# Patient Record
Sex: Male | Born: 1940
Health system: Southern US, Community
[De-identification: ages and names within clinical notes are randomized; demographics above are authoritative.]

## PROBLEM LIST (undated history)

## (undated) DIAGNOSIS — L0390 Cellulitis, unspecified: Secondary | ICD-10-CM

## (undated) DIAGNOSIS — M1990 Unspecified osteoarthritis, unspecified site: Secondary | ICD-10-CM

## (undated) DIAGNOSIS — E875 Hyperkalemia: Secondary | ICD-10-CM

## (undated) DIAGNOSIS — E785 Hyperlipidemia, unspecified: Secondary | ICD-10-CM

## (undated) DIAGNOSIS — E119 Type 2 diabetes mellitus without complications: Secondary | ICD-10-CM

## (undated) DIAGNOSIS — I10 Essential (primary) hypertension: Secondary | ICD-10-CM

## (undated) DIAGNOSIS — N189 Chronic kidney disease, unspecified: Secondary | ICD-10-CM

## (undated) DIAGNOSIS — E860 Dehydration: Secondary | ICD-10-CM

## (undated) DIAGNOSIS — D649 Anemia, unspecified: Secondary | ICD-10-CM

## (undated) HISTORY — PX: CERVICAL DISC SURGERY: SHX588

## (undated) HISTORY — PX: BACK SURGERY: SHX140

---

## 1999-07-01 ENCOUNTER — Ambulatory Visit (HOSPITAL_COMMUNITY): Admission: RE | Admit: 1999-07-01 | Discharge: 1999-07-01 | Payer: Self-pay | Admitting: Gastroenterology

## 2002-10-22 ENCOUNTER — Ambulatory Visit (HOSPITAL_COMMUNITY): Admission: RE | Admit: 2002-10-22 | Discharge: 2002-10-22 | Payer: Self-pay | Admitting: Gastroenterology

## 2003-02-06 ENCOUNTER — Encounter: Payer: Self-pay | Admitting: Internal Medicine

## 2003-02-06 ENCOUNTER — Encounter: Admission: RE | Admit: 2003-02-06 | Discharge: 2003-02-06 | Payer: Self-pay | Admitting: Internal Medicine

## 2003-03-26 ENCOUNTER — Inpatient Hospital Stay (HOSPITAL_COMMUNITY): Admission: AD | Admit: 2003-03-26 | Discharge: 2003-03-29 | Payer: Self-pay | Admitting: Neurosurgery

## 2003-05-09 ENCOUNTER — Encounter: Admission: RE | Admit: 2003-05-09 | Discharge: 2003-05-09 | Payer: Self-pay | Admitting: Neurosurgery

## 2003-07-09 ENCOUNTER — Encounter: Admission: RE | Admit: 2003-07-09 | Discharge: 2003-07-09 | Payer: Self-pay | Admitting: Neurosurgery

## 2012-06-30 ENCOUNTER — Ambulatory Visit (HOSPITAL_COMMUNITY)
Admission: RE | Admit: 2012-06-30 | Discharge: 2012-06-30 | Disposition: A | Discharge status: Home / Self Care | Payer: BC Managed Care – PPO | Source: Ambulatory Visit | Attending: Internal Medicine | Admitting: Internal Medicine

## 2012-06-30 ENCOUNTER — Other Ambulatory Visit: Payer: Self-pay | Admitting: Internal Medicine

## 2012-06-30 ENCOUNTER — Other Ambulatory Visit (HOSPITAL_COMMUNITY): Payer: Self-pay | Admitting: Internal Medicine

## 2012-06-30 DIAGNOSIS — M7989 Other specified soft tissue disorders: Secondary | ICD-10-CM

## 2012-06-30 NOTE — Progress Notes (Signed)
*  Preliminary Results* Left lower extremity venous duplex completed. Left lower extremity is negative for deep vein thrombosis. No evidence of left Baker's cyst. There is a thrombosed varicose vein of the medial mid left calf. Preliminary results discussed with Bethena Roys, RN of Dr.Polite's office. Patient has been instructed by Dr.Polite to return home.  06/30/2012 1:45 PM Maudry Mayhew, RDMS, RDCS

## 2012-09-22 ENCOUNTER — Ambulatory Visit
Admission: RE | Admit: 2012-09-22 | Discharge: 2012-09-22 | Disposition: A | Discharge status: Home / Self Care | Payer: BC Managed Care – PPO | Source: Ambulatory Visit | Attending: Internal Medicine | Admitting: Internal Medicine

## 2012-09-22 ENCOUNTER — Other Ambulatory Visit: Payer: Self-pay | Admitting: Internal Medicine

## 2012-09-22 DIAGNOSIS — G959 Disease of spinal cord, unspecified: Secondary | ICD-10-CM

## 2012-10-07 DIAGNOSIS — M25569 Pain in unspecified knee: Secondary | ICD-10-CM | POA: Insufficient documentation

## 2012-10-12 ENCOUNTER — Other Ambulatory Visit: Payer: Self-pay | Admitting: Neurosurgery

## 2012-10-13 ENCOUNTER — Encounter (HOSPITAL_COMMUNITY): Payer: Self-pay | Admitting: Pharmacy Technician

## 2012-10-16 ENCOUNTER — Encounter (HOSPITAL_COMMUNITY)
Admission: RE | Admit: 2012-10-16 | Discharge: 2012-10-16 | Disposition: A | Discharge status: Home / Self Care | Payer: BC Managed Care – PPO | Source: Ambulatory Visit | Attending: Neurosurgery | Admitting: Neurosurgery

## 2012-10-16 ENCOUNTER — Encounter (HOSPITAL_COMMUNITY): Payer: Self-pay

## 2012-10-16 HISTORY — DX: Hyperlipidemia, unspecified: E78.5

## 2012-10-16 HISTORY — DX: Type 2 diabetes mellitus without complications: E11.9

## 2012-10-16 LAB — SURGICAL PCR SCREEN
MRSA, PCR: NEGATIVE
Staphylococcus aureus: POSITIVE — AB

## 2012-10-16 LAB — BASIC METABOLIC PANEL
Chloride: 107 mEq/L (ref 96–112)
GFR calc Af Amer: 83 mL/min — ABNORMAL LOW (ref >90)
GFR calc non Af Amer: 72 mL/min — ABNORMAL LOW (ref >90)
Potassium: 4.4 mEq/L (ref 3.5–5.1)

## 2012-10-16 LAB — CBC WITH DIFFERENTIAL/PLATELET
Basophils Absolute: 0.1 10*3/uL (ref 0.0–0.1)
HCT: 35.3 % — ABNORMAL LOW (ref 39.0–52.0)
Hemoglobin: 11.4 g/dL — ABNORMAL LOW (ref 13.0–17.0)
Lymphocytes Relative: 9 % — ABNORMAL LOW (ref 12–46)
Monocytes Absolute: 0.8 10*3/uL (ref 0.1–1.0)
Monocytes Relative: 10 % (ref 3–12)
Neutro Abs: 6.4 10*3/uL (ref 1.7–7.7)
Neutrophils Relative %: 77 % (ref 43–77)
RDW: 14 % (ref 11.5–15.5)
WBC: 8.4 10*3/uL (ref 4.0–10.5)

## 2012-10-16 MED ORDER — CEFAZOLIN SODIUM-DEXTROSE 2-3 GM-% IV SOLR
2.0000 g | INTRAVENOUS | Status: AC
  Administered 2012-10-17: 2 g via INTRAVENOUS
  Filled 2012-10-16: qty 50

## 2012-10-16 NOTE — Pre-Procedure Instructions (Signed)
Jason Mcpherson  10/16/2012   Your procedure is scheduled on: Tuesday, October 17, 2012  Report to [facility] at 10:45 AM.  Call this number if you have problems the morning of surgery: [phone]   Remember:   Do not eat food or drink liquids after midnight.   Take these medicines the morning of surgery with A SIP OF WATER: none             Stop taking Aspirin and herbal medications. Do not take any NSAIDs ie: Ibuprofen, Advil, Naproxen or any medication containing Aspirin.  Do not wear jewelry, make-up or nail polish.  Do not wear lotions, powders, or perfumes. You may wear deodorant.  Do not shave 48 hours prior to surgery. Men may shave face and neck.  Do not bring valuables to the hospital.  [facility] is not responsible  for any belongings or valuables.  Contacts, dentures or bridgework may not be worn into surgery.  Leave suitcase in the car. After surgery it may be brought to your room.  For patients admitted to the hospital, checkout time is 11:00 AM the day of discharge.   Patients discharged the day of surgery will not be allowed to drive home.  Name and phone number of your driver:   Special Instructions: Shower using CHG 2 nights before surgery and the night before surgery.  If you shower the day of surgery use CHG.  Use special wash - you have one bottle of CHG for all showers.  You should use approximately 1/3 of the bottle for each shower.   Please read over the following fact sheets that you were given: Pain Booklet, Coughing and Deep Breathing, MRSA Information and Surgical Site Infection Prevention

## 2012-10-17 ENCOUNTER — Ambulatory Visit (HOSPITAL_COMMUNITY): Payer: BC Managed Care – PPO

## 2012-10-17 ENCOUNTER — Inpatient Hospital Stay (HOSPITAL_COMMUNITY)
Admission: RE | Admit: 2012-10-17 | Discharge: 2012-10-18 | DRG: 865 | Disposition: A | Discharge status: Home / Self Care | Payer: BC Managed Care – PPO | Source: Ambulatory Visit | Attending: Neurosurgery | Admitting: Neurosurgery

## 2012-10-17 ENCOUNTER — Encounter (HOSPITAL_COMMUNITY)
Admission: RE | Disposition: A | Discharge status: Home / Self Care | Payer: Self-pay | Source: Ambulatory Visit | Attending: Neurosurgery

## 2012-10-17 ENCOUNTER — Encounter (HOSPITAL_COMMUNITY): Payer: Self-pay | Admitting: Anesthesiology

## 2012-10-17 ENCOUNTER — Encounter (HOSPITAL_COMMUNITY): Payer: Self-pay | Admitting: *Deleted

## 2012-10-17 ENCOUNTER — Ambulatory Visit (HOSPITAL_COMMUNITY): Payer: BC Managed Care – PPO | Admitting: Anesthesiology

## 2012-10-17 DIAGNOSIS — Z01812 Encounter for preprocedural laboratory examination: Secondary | ICD-10-CM

## 2012-10-17 DIAGNOSIS — M4802 Spinal stenosis, cervical region: Secondary | ICD-10-CM | POA: Diagnosis present

## 2012-10-17 DIAGNOSIS — Z87891 Personal history of nicotine dependence: Secondary | ICD-10-CM

## 2012-10-17 DIAGNOSIS — Z01810 Encounter for preprocedural cardiovascular examination: Secondary | ICD-10-CM

## 2012-10-17 DIAGNOSIS — E119 Type 2 diabetes mellitus without complications: Secondary | ICD-10-CM | POA: Diagnosis present

## 2012-10-17 DIAGNOSIS — Z794 Long term (current) use of insulin: Secondary | ICD-10-CM

## 2012-10-17 DIAGNOSIS — E785 Hyperlipidemia, unspecified: Secondary | ICD-10-CM | POA: Diagnosis present

## 2012-10-17 DIAGNOSIS — Z79899 Other long term (current) drug therapy: Secondary | ICD-10-CM

## 2012-10-17 DIAGNOSIS — Z7982 Long term (current) use of aspirin: Secondary | ICD-10-CM

## 2012-10-17 DIAGNOSIS — I10 Essential (primary) hypertension: Secondary | ICD-10-CM | POA: Diagnosis present

## 2012-10-17 DIAGNOSIS — M4712 Other spondylosis with myelopathy, cervical region: Principal | ICD-10-CM | POA: Diagnosis present

## 2012-10-17 HISTORY — PX: ANTERIOR CERVICAL DECOMP/DISCECTOMY FUSION: SHX1161

## 2012-10-17 LAB — GLUCOSE, CAPILLARY: Glucose-Capillary: 265 mg/dL — ABNORMAL HIGH (ref 70–99)

## 2012-10-17 SURGERY — ANTERIOR CERVICAL DECOMPRESSION/DISCECTOMY FUSION 2 LEVELS
Anesthesia: General | Site: Neck | Wound class: Clean

## 2012-10-17 MED ORDER — BACITRACIN 50000 UNITS IM SOLR
INTRAMUSCULAR | Status: DC | PRN
  Administered 2012-10-17: 15:00:00

## 2012-10-17 MED ORDER — MIDAZOLAM HCL 2 MG/2ML IJ SOLN: 0.5000 mg | Freq: Once | INTRAMUSCULAR | Status: DC | PRN

## 2012-10-17 MED ORDER — HYDROMORPHONE HCL PF 1 MG/ML IJ SOLN
INTRAMUSCULAR | Status: DC | PRN
  Administered 2012-10-17: 1 mg via INTRAVENOUS

## 2012-10-17 MED ORDER — SODIUM CHLORIDE 0.9 % IV SOLN: 250.0000 mL | INTRAVENOUS | Status: DC

## 2012-10-17 MED ORDER — OXYCODONE-ACETAMINOPHEN 5-325 MG PO TABS: 1.0000 | ORAL_TABLET | ORAL | Status: DC | PRN

## 2012-10-17 MED ORDER — OXYCODONE HCL 5 MG PO TABS: 5.0000 mg | ORAL_TABLET | Freq: Once | ORAL | Status: DC | PRN

## 2012-10-17 MED ORDER — PHENOL 1.4 % MT LIQD
1.0000 | OROMUCOSAL | Status: DC | PRN
  Administered 2012-10-17: 1 via OROMUCOSAL
  Filled 2012-10-17: qty 177

## 2012-10-17 MED ORDER — ONDANSETRON HCL 4 MG/2ML IJ SOLN: 4.0000 mg | INTRAMUSCULAR | Status: DC | PRN

## 2012-10-17 MED ORDER — NEOSTIGMINE METHYLSULFATE 1 MG/ML IJ SOLN
INTRAMUSCULAR | Status: DC | PRN
  Administered 2012-10-17: 3 mg via INTRAVENOUS

## 2012-10-17 MED ORDER — GLYCOPYRROLATE 0.2 MG/ML IJ SOLN
INTRAMUSCULAR | Status: DC | PRN
  Administered 2012-10-17: 0.4 mg via INTRAVENOUS
  Administered 2012-10-17: 0.2 mg via INTRAVENOUS

## 2012-10-17 MED ORDER — ROCURONIUM BROMIDE 100 MG/10ML IV SOLN
INTRAVENOUS | Status: DC | PRN
  Administered 2012-10-17: 25 mg via INTRAVENOUS
  Administered 2012-10-17: 40 mg via INTRAVENOUS

## 2012-10-17 MED ORDER — 0.9 % SODIUM CHLORIDE (POUR BTL) OPTIME
TOPICAL | Status: DC | PRN
  Administered 2012-10-17: 1000 mL

## 2012-10-17 MED ORDER — FENTANYL CITRATE 0.05 MG/ML IJ SOLN
INTRAMUSCULAR | Status: DC | PRN
  Administered 2012-10-17 (×5): 50 ug via INTRAVENOUS

## 2012-10-17 MED ORDER — PHENYLEPHRINE HCL 10 MG/ML IJ SOLN
INTRAMUSCULAR | Status: DC | PRN
  Administered 2012-10-17 (×2): 80 ug via INTRAVENOUS

## 2012-10-17 MED ORDER — THROMBIN 5000 UNITS EX SOLR
CUTANEOUS | Status: DC | PRN
  Administered 2012-10-17: 5000 [IU] via TOPICAL

## 2012-10-17 MED ORDER — INSULIN DETEMIR 100 UNIT/ML ~~LOC~~ SOLN
16.0000 [IU] | Freq: Two times a day (BID) | SUBCUTANEOUS | Status: DC
  Administered 2012-10-17: 16 [IU] via SUBCUTANEOUS
  Filled 2012-10-17 (×3): qty 0.16

## 2012-10-17 MED ORDER — HYDROMORPHONE HCL PF 1 MG/ML IJ SOLN: 0.2500 mg | INTRAMUSCULAR | Status: DC | PRN

## 2012-10-17 MED ORDER — THROMBIN 5000 UNITS EX SOLR
OROMUCOSAL | Status: DC | PRN
  Administered 2012-10-17: 16:00:00 via TOPICAL

## 2012-10-17 MED ORDER — PROPOFOL 10 MG/ML IV BOLUS
INTRAVENOUS | Status: DC | PRN
  Administered 2012-10-17: 100 mg via INTRAVENOUS

## 2012-10-17 MED ORDER — OXYCODONE HCL 5 MG/5ML PO SOLN: 5.0000 mg | Freq: Once | ORAL | Status: DC | PRN

## 2012-10-17 MED ORDER — CYCLOBENZAPRINE HCL 10 MG PO TABS: 10.0000 mg | ORAL_TABLET | Freq: Three times a day (TID) | ORAL | Status: DC | PRN

## 2012-10-17 MED ORDER — SIMVASTATIN 20 MG PO TABS
20.0000 mg | ORAL_TABLET | Freq: Every evening | ORAL | Status: DC
  Administered 2012-10-17: 20 mg via ORAL
  Filled 2012-10-17 (×2): qty 1

## 2012-10-17 MED ORDER — HYDROCODONE-ACETAMINOPHEN 5-325 MG PO TABS: 1.0000 | ORAL_TABLET | ORAL | Status: DC | PRN

## 2012-10-17 MED ORDER — LOSARTAN POTASSIUM 50 MG PO TABS
100.0000 mg | ORAL_TABLET | Freq: Every day | ORAL | Status: DC
  Administered 2012-10-17 – 2012-10-18 (×2): 100 mg via ORAL
  Filled 2012-10-17 (×2): qty 2

## 2012-10-17 MED ORDER — ALUM & MAG HYDROXIDE-SIMETH 200-200-20 MG/5ML PO SUSP: 30.0000 mL | Freq: Four times a day (QID) | ORAL | Status: DC | PRN

## 2012-10-17 MED ORDER — CEFAZOLIN SODIUM 1-5 GM-% IV SOLN
1.0000 g | Freq: Three times a day (TID) | INTRAVENOUS | Status: AC
  Administered 2012-10-17 – 2012-10-18 (×2): 1 g via INTRAVENOUS
  Filled 2012-10-17 (×2): qty 50

## 2012-10-17 MED ORDER — HEMOSTATIC AGENTS (NO CHARGE) OPTIME
TOPICAL | Status: DC | PRN
  Administered 2012-10-17: 1 via TOPICAL

## 2012-10-17 MED ORDER — LACTATED RINGERS IV SOLN
INTRAVENOUS | Status: DC | PRN
  Administered 2012-10-17 (×2): via INTRAVENOUS

## 2012-10-17 MED ORDER — METFORMIN HCL 500 MG PO TABS
1000.0000 mg | ORAL_TABLET | Freq: Two times a day (BID) | ORAL | Status: DC
Start: 2012-10-18 — End: 2012-10-18
  Administered 2012-10-18: 1000 mg via ORAL
  Filled 2012-10-17 (×3): qty 2

## 2012-10-17 MED ORDER — MENTHOL 3 MG MT LOZG: 1.0000 | LOZENGE | OROMUCOSAL | Status: DC | PRN

## 2012-10-17 MED ORDER — SODIUM CHLORIDE 0.9 % IV SOLN
INTRAVENOUS | Status: AC
  Administered 2012-10-17: 1000 mL
  Filled 2012-10-17: qty 500

## 2012-10-17 MED ORDER — SODIUM CHLORIDE 0.9 % IJ SOLN: 3.0000 mL | INTRAMUSCULAR | Status: DC | PRN

## 2012-10-17 MED ORDER — SENNA 8.6 MG PO TABS
1.0000 | ORAL_TABLET | Freq: Two times a day (BID) | ORAL | Status: DC
  Administered 2012-10-17 – 2012-10-18 (×2): 8.6 mg via ORAL
  Filled 2012-10-17 (×3): qty 1

## 2012-10-17 MED ORDER — MEPERIDINE HCL 25 MG/ML IJ SOLN: 6.2500 mg | INTRAMUSCULAR | Status: DC | PRN

## 2012-10-17 MED ORDER — ACETAMINOPHEN 325 MG PO TABS: 650.0000 mg | ORAL_TABLET | ORAL | Status: DC | PRN

## 2012-10-17 MED ORDER — ACETAMINOPHEN 650 MG RE SUPP: 650.0000 mg | RECTAL | Status: DC | PRN

## 2012-10-17 MED ORDER — FERROUS SULFATE 325 (65 FE) MG PO TABS
325.0000 mg | ORAL_TABLET | Freq: Every day | ORAL | Status: DC
  Administered 2012-10-18: 325 mg via ORAL
  Filled 2012-10-17 (×2): qty 1

## 2012-10-17 MED ORDER — EPHEDRINE SULFATE 50 MG/ML IJ SOLN
INTRAMUSCULAR | Status: DC | PRN
  Administered 2012-10-17: 10 mg via INTRAVENOUS

## 2012-10-17 MED ORDER — DEXAMETHASONE SODIUM PHOSPHATE 10 MG/ML IJ SOLN
10.0000 mg | INTRAMUSCULAR | Status: AC
  Administered 2012-10-17: 10 mg via INTRAVENOUS
  Filled 2012-10-17: qty 1

## 2012-10-17 MED ORDER — MIDAZOLAM HCL 5 MG/5ML IJ SOLN
INTRAMUSCULAR | Status: DC | PRN
  Administered 2012-10-17 (×2): 1 mg via INTRAVENOUS

## 2012-10-17 MED ORDER — HYDROMORPHONE HCL PF 1 MG/ML IJ SOLN
0.5000 mg | INTRAMUSCULAR | Status: DC | PRN
  Administered 2012-10-17: 1 mg via INTRAVENOUS
  Filled 2012-10-17: qty 1

## 2012-10-17 MED ORDER — SODIUM CHLORIDE 0.9 % IJ SOLN
3.0000 mL | Freq: Two times a day (BID) | INTRAMUSCULAR | Status: DC
  Administered 2012-10-17 – 2012-10-18 (×2): 3 mL via INTRAVENOUS

## 2012-10-17 MED ORDER — MUPIROCIN 2 % EX OINT
1.0000 "application " | TOPICAL_OINTMENT | Freq: Two times a day (BID) | CUTANEOUS | Status: DC
  Administered 2012-10-17 – 2012-10-18 (×2): 1 via NASAL
  Filled 2012-10-17: qty 22

## 2012-10-17 MED ORDER — ONDANSETRON HCL 4 MG/2ML IJ SOLN
INTRAMUSCULAR | Status: DC | PRN
  Administered 2012-10-17: 4 mg via INTRAVENOUS

## 2012-10-17 MED ORDER — LIDOCAINE HCL (CARDIAC) 20 MG/ML IV SOLN
INTRAVENOUS | Status: DC | PRN
  Administered 2012-10-17: 60 mg via INTRAVENOUS

## 2012-10-17 MED ORDER — ASPIRIN 81 MG PO CHEW
81.0000 mg | CHEWABLE_TABLET | Freq: Every day | ORAL | Status: DC
  Administered 2012-10-17 – 2012-10-18 (×2): 81 mg via ORAL
  Filled 2012-10-17 (×2): qty 1

## 2012-10-17 MED ORDER — GLIPIZIDE ER 5 MG PO TB24
5.0000 mg | ORAL_TABLET | Freq: Every day | ORAL | Status: DC
  Administered 2012-10-17 – 2012-10-18 (×2): 5 mg via ORAL
  Filled 2012-10-17 (×2): qty 1

## 2012-10-17 MED ORDER — BACITRACIN 50000 UNITS IM SOLR
INTRAMUSCULAR | Status: AC
  Filled 2012-10-17: qty 1

## 2012-10-17 MED ORDER — INSULIN ASPART 100 UNIT/ML ~~LOC~~ SOLN
0.0000 [IU] | Freq: Three times a day (TID) | SUBCUTANEOUS | Status: DC
  Administered 2012-10-18: 5 [IU] via SUBCUTANEOUS

## 2012-10-17 MED ORDER — PROMETHAZINE HCL 25 MG/ML IJ SOLN: 6.2500 mg | INTRAMUSCULAR | Status: DC | PRN

## 2012-10-17 SURGICAL SUPPLY — 65 items
ADH SKN CLS APL DERMABOND .7 (GAUZE/BANDAGES/DRESSINGS) ×1
APL SKNCLS STERI-STRIP NONHPOA (GAUZE/BANDAGES/DRESSINGS) ×1
BAG DECANTER FOR FLEXI CONT (MISCELLANEOUS) ×2 IMPLANT
BENZOIN TINCTURE PRP APPL 2/3 (GAUZE/BANDAGES/DRESSINGS) ×2 IMPLANT
BRUSH SCRUB EZ PLAIN DRY (MISCELLANEOUS) ×2 IMPLANT
BUR MATCHSTICK NEURO 3.0 LAGG (BURR) ×2 IMPLANT
CANISTER SUCTION 2500CC (MISCELLANEOUS) ×2 IMPLANT
CLOTH BEACON ORANGE TIMEOUT ST (SAFETY) ×2 IMPLANT
CONT SPEC 4OZ CLIKSEAL STRL BL (MISCELLANEOUS) ×2 IMPLANT
DERMABOND ADVANCED (GAUZE/BANDAGES/DRESSINGS) ×1
DERMABOND ADVANCED .7 DNX12 (GAUZE/BANDAGES/DRESSINGS) IMPLANT
DRAPE C-ARM 42X72 X-RAY (DRAPES) ×4 IMPLANT
DRAPE LAPAROTOMY 100X72 PEDS (DRAPES) ×2 IMPLANT
DRAPE MICROSCOPE ZEISS OPMI (DRAPES) ×2 IMPLANT
DRAPE POUCH INSTRU U-SHP 10X18 (DRAPES) ×2 IMPLANT
DRILL BIT (BIT) ×1 IMPLANT
DURAPREP 6ML APPLICATOR 50/CS (WOUND CARE) ×2 IMPLANT
ELECT COATED BLADE 2.86 ST (ELECTRODE) ×2 IMPLANT
ELECT REM PT RETURN 9FT ADLT (ELECTROSURGICAL) ×2
ELECTRODE REM PT RTRN 9FT ADLT (ELECTROSURGICAL) ×1 IMPLANT
GAUZE SPONGE 4X4 16PLY XRAY LF (GAUZE/BANDAGES/DRESSINGS) IMPLANT
GLOVE BIOGEL PI IND STRL 7.0 (GLOVE) IMPLANT
GLOVE BIOGEL PI IND STRL 8 (GLOVE) IMPLANT
GLOVE BIOGEL PI IND STRL 8.5 (GLOVE) IMPLANT
GLOVE BIOGEL PI INDICATOR 7.0 (GLOVE) ×1
GLOVE BIOGEL PI INDICATOR 8 (GLOVE) ×1
GLOVE BIOGEL PI INDICATOR 8.5 (GLOVE) ×1
GLOVE ECLIPSE 7.5 STRL STRAW (GLOVE) ×2 IMPLANT
GLOVE ECLIPSE 8.5 STRL (GLOVE) ×3 IMPLANT
GLOVE EXAM NITRILE LRG STRL (GLOVE) IMPLANT
GLOVE EXAM NITRILE MD LF STRL (GLOVE) IMPLANT
GLOVE EXAM NITRILE XL STR (GLOVE) IMPLANT
GLOVE EXAM NITRILE XS STR PU (GLOVE) IMPLANT
GLOVE SS BIOGEL STRL SZ 6.5 (GLOVE) IMPLANT
GLOVE SUPERSENSE BIOGEL SZ 6.5 (GLOVE) ×1
GOWN BRE IMP SLV AUR LG STRL (GOWN DISPOSABLE) ×1 IMPLANT
GOWN BRE IMP SLV AUR XL STRL (GOWN DISPOSABLE) ×3 IMPLANT
GOWN STRL REIN 2XL LVL4 (GOWN DISPOSABLE) ×1 IMPLANT
HEAD HALTER (SOFTGOODS) ×2 IMPLANT
HEMOSTAT SURGICEL 2X14 (HEMOSTASIS) IMPLANT
KIT BASIN OR (CUSTOM PROCEDURE TRAY) ×2 IMPLANT
KIT ROOM TURNOVER OR (KITS) ×2 IMPLANT
NDL SPNL 20GX3.5 QUINCKE YW (NEEDLE) ×1 IMPLANT
NEEDLE SPNL 20GX3.5 QUINCKE YW (NEEDLE) ×2 IMPLANT
NS IRRIG 1000ML POUR BTL (IV SOLUTION) ×2 IMPLANT
PACK LAMINECTOMY NEURO (CUSTOM PROCEDURE TRAY) ×2 IMPLANT
PAD ARMBOARD 7.5X6 YLW CONV (MISCELLANEOUS) ×6 IMPLANT
PLATE 45MM (Plate) ×2 IMPLANT
PLATE 45XATL VS ELT (Plate) IMPLANT
RUBBERBAND STERILE (MISCELLANEOUS) ×4 IMPLANT
SCREW ST 13X4XST VA NS SPNE (Screw) IMPLANT
SCREW ST VAR 4 ATL (Screw) ×12 IMPLANT
SPACER BONE CORNERSTONE 7X14 (Orthopedic Implant) ×2 IMPLANT
SPONGE GAUZE 4X4 12PLY (GAUZE/BANDAGES/DRESSINGS) ×2 IMPLANT
SPONGE INTESTINAL PEANUT (DISPOSABLE) ×2 IMPLANT
SPONGE SURGIFOAM ABS GEL SZ50 (HEMOSTASIS) ×2 IMPLANT
STRIP CLOSURE SKIN 1/2X4 (GAUZE/BANDAGES/DRESSINGS) ×2 IMPLANT
SUT PDS AB 5-0 P3 18 (SUTURE) ×2 IMPLANT
SUT VIC AB 3-0 SH 8-18 (SUTURE) ×2 IMPLANT
SYR 20ML ECCENTRIC (SYRINGE) ×2 IMPLANT
TAPE CLOTH 4X10 WHT NS (GAUZE/BANDAGES/DRESSINGS) ×1 IMPLANT
TAPE CLOTH SURG 4X10 WHT LF (GAUZE/BANDAGES/DRESSINGS) ×1 IMPLANT
TOWEL OR 17X24 6PK STRL BLUE (TOWEL DISPOSABLE) ×2 IMPLANT
TOWEL OR 17X26 10 PK STRL BLUE (TOWEL DISPOSABLE) ×2 IMPLANT
WATER STERILE IRR 1000ML POUR (IV SOLUTION) ×2 IMPLANT

## 2012-10-17 NOTE — Anesthesia Postprocedure Evaluation (Signed)
  Anesthesia Post-op Note  Patient: Jason Mcpherson  Procedure(s) Performed: Procedure(s) with comments: ANTERIOR CERVICAL DECOMPRESSION/DISCECTOMY FUSION 2 LEVELS (N/A) - Cervical three-four, four-five anterior cervical decompression fusion with allograft plating  Patient Location: PACU  Anesthesia Type:General  Level of Consciousness: awake  Airway and Oxygen Therapy: Patient Spontanous Breathing  Post-op Pain: mild  Post-op Assessment: Post-op Vital signs reviewed  Post-op Vital Signs: Reviewed  Complications: No apparent anesthesia complications

## 2012-10-17 NOTE — Transfer of Care (Signed)
Immediate Anesthesia Transfer of Care Note  Patient: Jason Mcpherson  Procedure(s) Performed: Procedure(s) with comments: ANTERIOR CERVICAL DECOMPRESSION/DISCECTOMY FUSION 2 LEVELS (N/A) - Cervical three-four, four-five anterior cervical decompression fusion with allograft plating  Patient Location: PACU  Anesthesia Type:General  Level of Consciousness: awake, alert , oriented and patient cooperative  Airway & Oxygen Therapy: Patient Spontanous Breathing and Patient connected to nasal cannula oxygen  Post-op Assessment: Report given to PACU RN, Post -op Vital signs reviewed and stable and Patient moving all extremities  Post vital signs: Reviewed and stable  Complications: No apparent anesthesia complications

## 2012-10-17 NOTE — Preoperative (Signed)
Beta Blockers   Reason not to administer Beta Blockers:Not Applicable 

## 2012-10-17 NOTE — Brief Op Note (Signed)
10/17/2012  4:24 PM  PATIENT:  Jason Mcpherson  72 y.o. male  PRE-OPERATIVE DIAGNOSIS:  stenosis  POST-OPERATIVE DIAGNOSIS:  stenosis  PROCEDURE:  Procedure(s) with comments: ANTERIOR CERVICAL DECOMPRESSION/DISCECTOMY FUSION 2 LEVELS (N/A) - Cervical three-four, four-five anterior cervical decompression fusion with allograft plating  SURGEON:  Surgeon(s) and Role:    * Charlie Pitter, MD - Primary    * Kristeen Miss, MD - Assisting  PHYSICIAN ASSISTANT:   ASSISTANTS:    ANESTHESIA:   general  EBL:  Total I/O In: 1000 [I.V.:1000] Out: 450 [Blood:450]  BLOOD ADMINISTERED:none  DRAINS: none   LOCAL MEDICATIONS USED:  NONE  SPECIMEN:  No Specimen  DISPOSITION OF SPECIMEN:  N/A  COUNTS:  YES  TOURNIQUET:  * No tourniquets in log *  DICTATION: .Dragon Dictation  PLAN OF CARE: Admit to inpatient   PATIENT DISPOSITION:  PACU - hemodynamically stable.   Delay start of Pharmacological VTE agent (>24hrs) due to surgical blood loss or risk of bleeding: yes

## 2012-10-17 NOTE — H&P (Signed)
Jason Mcpherson is an 72 y.o. male.   Chief Complaint: Neck and bilateral arm pain HPI: 72 year old male with progressive neck pain with radiation to both upper extremities right greater than left. Patient with significant right shoulder weakness as well a severe bilateral upper trimming numbness and paresthesias. He also notes some gait difficulty. Workup demonstrates evidence of severe stenosis at C3-4 and C4-5 with spinal cord compression. Patient presents now for 2 level anterior cervical decompression in hopes of improving his cecum compressive cervical myelopathy.  Past Medical History  Diagnosis Date  . Diabetes mellitus without complication   . Hyperlipemia     Past Surgical History  Procedure Laterality Date  . Back surgery      10 years ago    History reviewed. No pertinent family history. Social History:  reports that he quit smoking about 15 years ago. He does not have any smokeless tobacco history on file. He reports that he does not drink alcohol or use illicit drugs.  Allergies: No Known Allergies  Medications Prior to Admission  Medication Sig Dispense Refill  . aspirin 81 MG chewable tablet Chew 81 mg by mouth daily.      . ferrous sulfate 325 (65 FE) MG tablet Take 325 mg by mouth daily with breakfast.      . glipiZIDE (GLUCOTROL XL) 5 MG 24 hr tablet Take 5 mg by mouth daily.      . insulin detemir (LEVEMIR) 100 UNIT/ML injection Inject 16 Units into the skin 2 (two) times daily.      Marland Kitchen losartan (COZAAR) 100 MG tablet Take 100 mg by mouth daily.      . metFORMIN (GLUCOPHAGE) 1000 MG tablet Take 1,000 mg by mouth 2 (two) times daily with a meal.      . mupirocin ointment (BACTROBAN) 2 % Place 1 application into the nose 2 (two) times daily.      . simvastatin (ZOCOR) 20 MG tablet Take 20 mg by mouth every evening.        Results for orders placed during the hospital encounter of 10/16/12 (from the past 48 hour(s))  SURGICAL PCR SCREEN     Status: Abnormal   Collection Time    10/16/12  9:26 AM      Result Value Range   MRSA, PCR NEGATIVE  NEGATIVE   Staphylococcus aureus POSITIVE (*) NEGATIVE   Comment:            The Xpert SA Assay (FDA     approved for NASAL specimens     in patients over 38 years of age),     is one component of     a comprehensive surveillance     program.  Test performance has     been validated by [employer] for patients greater     than or equal to 52 year old.     It is not intended     to diagnose infection nor to     guide or monitor treatment.  BASIC METABOLIC PANEL     Status: Abnormal   Collection Time    10/16/12  9:30 AM      Result Value Range   Sodium 143  135 - 145 mEq/L   Potassium 4.4  3.5 - 5.1 mEq/L   Chloride 107  96 - 112 mEq/L   CO2 26  19 - 32 mEq/L   Glucose, Bld 238 (*) 70 - 99 mg/dL   BUN 25 (*)  6 - 23 mg/dL   Creatinine, Ser 1.01  0.50 - 1.35 mg/dL   Calcium 9.4  8.4 - 10.5 mg/dL   GFR calc non Af Amer 72 (*) >90 mL/min   GFR calc Af Amer 83 (*) >90 mL/min   Comment:            The eGFR has been calculated     using the CKD EPI equation.     This calculation has not been     validated in all clinical     situations.     eGFR's persistently     <90 mL/min signify     possible Chronic Kidney Disease.  CBC WITH DIFFERENTIAL     Status: Abnormal   Collection Time    10/16/12  9:30 AM      Result Value Range   WBC 8.4  4.0 - 10.5 K/uL   RBC 3.76 (*) 4.22 - 5.81 MIL/uL   Hemoglobin 11.4 (*) 13.0 - 17.0 g/dL   HCT 35.3 (*) 39.0 - 52.0 %   MCV 93.9  78.0 - 100.0 fL   MCH 30.3  26.0 - 34.0 pg   MCHC 32.3  30.0 - 36.0 g/dL   RDW 14.0  11.5 - 15.5 %   Platelets 151  150 - 400 K/uL   Neutrophils Relative % 77  43 - 77 %   Neutro Abs 6.4  1.7 - 7.7 K/uL   Lymphocytes Relative 9 (*) 12 - 46 %   Lymphs Abs 0.8  0.7 - 4.0 K/uL   Monocytes Relative 10  3 - 12 %   Monocytes Absolute 0.8  0.1 - 1.0 K/uL   Eosinophils Relative 4  0 - 5 %   Eosinophils Absolute 0.3  0.0 - 0.7  K/uL   Basophils Relative 1  0 - 1 %   Basophils Absolute 0.1  0.0 - 0.1 K/uL   No results found.  Review of Systems  Constitutional: Negative.   HENT: Negative.   Eyes: Negative.   Respiratory: Negative.   Cardiovascular: Negative.   Gastrointestinal: Negative.   Genitourinary: Negative.   Musculoskeletal: Negative.   Skin: Negative.   Neurological: Negative.   Endo/Heme/Allergies: Negative.   Psychiatric/Behavioral: Negative.     Blood pressure 148/85, pulse 87, temperature 97.1 F (36.2 C), temperature source Oral, resp. rate 20, SpO2 99.00%. Physical Exam  Constitutional: He is oriented to person, place, and time. He appears well-developed and well-nourished. No distress.  HENT:  Head: Normocephalic and atraumatic.  Right Ear: External ear normal.  Left Ear: External ear normal.  Nose: Nose normal.  Mouth/Throat: Oropharynx is clear and moist.  Eyes: Conjunctivae and EOM are normal. Pupils are equal, round, and reactive to light. Right eye exhibits no discharge. Left eye exhibits no discharge.  Neck: Normal range of motion. Neck supple. No tracheal deviation present. No thyromegaly present.  Cardiovascular: Normal rate, regular rhythm, normal heart sounds and intact distal pulses.  Exam reveals no friction rub.   No murmur heard. Respiratory: Effort normal and breath sounds normal. No respiratory distress. He has no wheezes.  GI: Soft. Bowel sounds are normal. He exhibits no distension. There is no tenderness.  Musculoskeletal: Normal range of motion. He exhibits no edema and no tenderness.  Neurological: He is alert and oriented to person, place, and time. He has normal reflexes. He displays normal reflexes. No cranial nerve deficit. He exhibits normal muscle tone. Coordination normal.  Skin: Skin is warm and dry. No rash  noted. He is not diaphoretic. No erythema. No pallor.  Psychiatric: He has a normal mood and affect. His behavior is normal. Judgment and thought  content normal.     Assessment/Plan C3-4, C4-5 stenosis with myelopathy. Plan C3-4, C4-5 anterior cervical discectomy and fusion with allograft and her plating. Risks and benefits been explained. Patient wishes to proceed.  Ebrahim Deremer A 10/17/2012, 2:24 PM

## 2012-10-17 NOTE — Anesthesia Preprocedure Evaluation (Addendum)
Anesthesia Evaluation  Patient identified by MRN, date of birth, ID band Patient awake    Reviewed: Allergy & Precautions, H&P , NPO status , Patient's Chart, lab work & pertinent test results  History of Anesthesia Complications Negative for: history of anesthetic complications  Airway Mallampati: II TM Distance: >3 FB Neck ROM: Full    Dental  (+) Edentulous Upper and Edentulous Lower   Pulmonary former smoker (quit '99),  breath sounds clear to auscultation  Pulmonary exam normal       Cardiovascular hypertension, Pt. on medications Rhythm:Regular Rate:Normal     Neuro/Psych    GI/Hepatic negative GI ROS, Neg liver ROS,   Endo/Other  diabetes (glu 96), Well Controlled, Type 2, Insulin Dependent and Oral Hypoglycemic Agents  Renal/GU negative Renal ROS     Musculoskeletal   Abdominal   Peds  Hematology negative hematology ROS (+)   Anesthesia Other Findings   Reproductive/Obstetrics                          Anesthesia Physical Anesthesia Plan  ASA: III  Anesthesia Plan: General   Post-op Pain Management:    Induction: Intravenous  Airway Management Planned: Oral ETT  Additional Equipment:   Intra-op Plan:   Post-operative Plan: Extubation in OR  Informed Consent: I have reviewed the patients History and Physical, chart, labs and discussed the procedure including the risks, benefits and alternatives for the proposed anesthesia with the patient or authorized representative who has indicated his/her understanding and acceptance.     Plan Discussed with: CRNA and Surgeon  Anesthesia Plan Comments: (Plan routine monitors, GETA with VideoGlide intubation )        Anesthesia Quick Evaluation

## 2012-10-17 NOTE — Op Note (Signed)
Date of procedure: 10/17/2012  Date of dictation: Same  Service: Neurosurgery  Preoperative diagnosis: C3-4, C4-5 stenosis with myelopathy  Postoperative diagnosis: Same  Procedure Name: C3-4, C4-5 anterior cervical discectomy and fusion with allograft and anterior plating  Surgeon:Kenyada Hy A.Samwise Eckardt, M.D.  Asst. Surgeon: Ellene Route  Anesthesia: General  Indication: 72 year old male with neck and bilateral upper trimming a pain paresthesias and weakness. Workup demonstrates evidence of severe stenosis with spinal cord compression at C3-4 and spondylosis with spinal cord and exiting right C5 nerve root compression at C4-5. Patient presents now for 2 level anterior cervical decompression infusion in hopes of improving his symptoms.  Operative note: After induction of anesthesia, patient positioned supine with neck Extended and held in place with halter traction. Patient's anterior cervical region prepped and draped sterilely. Incision made overlying the C4 vertebral level. Dissection then proceeded down to the level of the platysma. This was then divided vertically and dissection proceeded along the medial border of the sternocleidomastoid muscle and carotid sheath. Trachea and esophagus were mobilized retracted towards the left. Prevertebral fascia stripped off the anterior spinal column. Longus: Was elevated bilaterally/Carter. Deep self retainers placed. Intraoperative fluoroscopy used levels were confirmed. The space of the C3-4 and C4-5 incised with a 15 blade. Pituitary rongeurs and curettes and Kerrison rongeurs were used to remove the disc down to the level posterior annulus. Microscope and brought field these at the remainder of the discectomy remaining aspects of annulus and osteophytes removed using high-speed drill down to the level posterior longitudinal ligament. Posterior lateral and was elevated and resected so fashion using Kerrison rongeurs. Underlying thecal sac was identified. Wide  decompression and perform undercutting the bodies of C3 and C4. Decompression MCH are afraid are wide anterior foraminotomies in the form of course exiting C4 nerve roots. This point a very thorough depression achieved. There is no his injury to thecal sac or nerve roots. Procedure then repeated at C4-5 again without complication. Wound is then irrigated and bike solution. Gelfoam placed topically for hemostasis. Cornerstone allograft wedges and packed into place at both levels. Each graft was recessed roughly 1 mm from anterior cortical surface. A 45 mm Atlantis anterior cervical plate and placed over the C3-4 and 5 levels. This infection or fluoroscopic guidance using 13 motor variable-angle screws 2 each at all 3 levels. All screws and final tightening down to be solidly within bone. Locking screws engaged at all 3 levels. Final images revealed good position the bone graft and hardware at the proper for level with normal lamina spine. Wound is then irrigated one final time. Hemostasis was assured with bipolar try repaired wounds and close in layers. Steri-Strips triggers were applied. There were no apparent outpatient. Patient tolerated the procedure well and  he returns to the recovery room postop.

## 2012-10-18 MED ORDER — CYCLOBENZAPRINE HCL 10 MG PO TABS: 10.0000 mg | ORAL_TABLET | Freq: Three times a day (TID) | ORAL | Status: DC | PRN

## 2012-10-18 MED ORDER — HYDROCODONE-ACETAMINOPHEN 5-325 MG PO TABS: 1.0000 | ORAL_TABLET | ORAL | Status: DC | PRN

## 2012-10-18 NOTE — Discharge Summary (Signed)
Physician Discharge Summary  Patient ID: Jason Mcpherson MRN: [id number] DOB/AGE: [birth year] 73 y.o.  Admit date: 10/17/2012 Discharge date: 10/18/2012  Admission Diagnoses:  Discharge Diagnoses:  Principal Problem:   Spinal stenosis in cervical region   Discharged Condition: good  Hospital Course: patient in the hospital where he underwent an uncomplicated 2 level anterior cervical discectomy and fusion with allograft and her plating. Postoperative is done well. He still has right shoulder weakness which is unchanged from preop. Otherwise his strength sensation are improved. His wound is healing well. Ovary for discharge home.  Consults:   Significant Diagnostic Studies:   Treatments:   Discharge Exam: Blood pressure 132/90, pulse 94, temperature 98.1 F (36.7 C), temperature source Oral, resp. rate 18, height 5\' 8"  (1.727 m), weight 80.4 kg (177 lb 4 oz), SpO2 100.00%. Awake and alert. Oriented and appropriate. Cranial nerve function intact. Motor examination intact except patient with 2/5 right deltoid right infraspinous right infraspinous strength stable from preop. Sensory exam improved from preop. Still with some mild hand numbness. Wound clean and dry. Chest and abdomen benign.  Disposition:      Medication List    TAKE these medications       aspirin 81 MG chewable tablet  Chew 81 mg by mouth daily.     cyclobenzaprine 10 MG tablet  Commonly known as:  FLEXERIL  Take 1 tablet (10 mg total) by mouth 3 (three) times daily as needed for muscle spasms.     ferrous sulfate 325 (65 FE) MG tablet  Take 325 mg by mouth daily with breakfast.     glipiZIDE 5 MG 24 hr tablet  Commonly known as:  GLUCOTROL XL  Take 5 mg by mouth daily.     HYDROcodone-acetaminophen 5-325 MG per tablet  Commonly known as:  NORCO/VICODIN  Take 1-2 tablets by mouth every 4 (four) hours as needed.     insulin detemir 100 UNIT/ML injection  Commonly known as:  LEVEMIR  Inject 16 Units  into the skin 2 (two) times daily.     losartan 100 MG tablet  Commonly known as:  COZAAR  Take 100 mg by mouth daily.     metFORMIN 1000 MG tablet  Commonly known as:  GLUCOPHAGE  Take 1,000 mg by mouth 2 (two) times daily with a meal.     mupirocin ointment 2 %  Commonly known as:  BACTROBAN  Place 1 application into the nose 2 (two) times daily.     simvastatin 20 MG tablet  Commonly known as:  ZOCOR  Take 20 mg by mouth every evening.           Follow-up Information   Follow up with Kolin Erdahl A, MD. Call in 2 weeks. (ask for [facility] extension [phone])    Contact information:   [address] [place] [address] [phone]       Signed: Jamier Urbas A 10/18/2012, 10:08 AM

## 2012-10-18 NOTE — Progress Notes (Signed)
Pt discharged home accompanied by Spouse  D/c instruction given script given to pick up medication at pharmacy  Surgical incision site clean dry and intact no s/s of redness or infection noted condition at discharge is stable

## 2012-10-18 NOTE — Progress Notes (Signed)
UR COMPLETED  

## 2012-10-19 ENCOUNTER — Encounter (HOSPITAL_COMMUNITY): Payer: Self-pay | Admitting: Neurosurgery

## 2013-05-21 ENCOUNTER — Ambulatory Visit (INDEPENDENT_AMBULATORY_CARE_PROVIDER_SITE_OTHER): Payer: BC Managed Care – PPO | Admitting: Podiatry

## 2013-05-21 ENCOUNTER — Encounter: Payer: Self-pay | Admitting: Podiatry

## 2013-05-21 ENCOUNTER — Ambulatory Visit (INDEPENDENT_AMBULATORY_CARE_PROVIDER_SITE_OTHER): Payer: BC Managed Care – PPO

## 2013-05-21 VITALS — BP 118/71 | HR 97 | Resp 16 | Ht 68.0 in | Wt 172.0 lb

## 2013-05-21 DIAGNOSIS — M79676 Pain in unspecified toe(s): Secondary | ICD-10-CM

## 2013-05-21 DIAGNOSIS — M79609 Pain in unspecified limb: Secondary | ICD-10-CM

## 2013-05-21 DIAGNOSIS — L84 Corns and callosities: Secondary | ICD-10-CM

## 2013-05-21 DIAGNOSIS — M2040 Other hammer toe(s) (acquired), unspecified foot: Secondary | ICD-10-CM

## 2013-05-21 NOTE — Progress Notes (Signed)
   Subjective:    Patient ID: Jason Mcpherson, male    DOB: 08-Mar-1941, 73 y.o.   MRN: [id number]  HPI Comments: "I have trouble with a toe"  Pt has a painful 2nd toe right foot, tip of toe, for about 2-3 weeks. Said that he caught his foot on a piece of metal and tripped injuring the toe. Its slightly red and callused at tip. He says it drains some. He has been soaking it and using petroleum jelly.     Review of Systems  Musculoskeletal: Positive for arthralgias.  Neurological: Positive for numbness.       Objective:   Physical Exam        Assessment & Plan:

## 2013-05-21 NOTE — Progress Notes (Signed)
Subjective:     Patient ID: Jason Mcpherson, male   DOB: [birth year], 73 y.o.   MRN: [id number]  Toe Pain    patient presents stating I'm having pain in my right second toe that is making it hard for me to wear shoe gear comfortably   Review of Systems  All other systems reviewed and are negative.       Objective:   Physical Exam  Nursing note and vitals reviewed. Constitutional: He is oriented to person, place, and time.  Cardiovascular: Intact distal pulses.   Musculoskeletal: Normal range of motion.  Neurological: He is oriented to person, place, and time.  Skin: Skin is warm.   neurovascular status intact with distal keratotic lesion digit 2 right that is painful when pressed and has no erythema or drainage surrounding it. Muscle strength found to be adequate range of motion within normal limits with digital deformities and elevation of the lesser digits consistent with structural change     Assessment:     Hammertoe deformity with structural malalignment second toe right foot    Plan:     Hammertoe deformity second right with a distal keratotic lesion is moderately painful when pressed that today we treated with debridement and buttress pad to lift the toe and soaks and will call if it does not improve over the next couple weeks

## 2013-08-01 DIAGNOSIS — L0390 Cellulitis, unspecified: Secondary | ICD-10-CM

## 2013-08-01 DIAGNOSIS — N189 Chronic kidney disease, unspecified: Secondary | ICD-10-CM

## 2013-08-01 DIAGNOSIS — E875 Hyperkalemia: Secondary | ICD-10-CM

## 2013-08-01 HISTORY — DX: Cellulitis, unspecified: L03.90

## 2013-08-01 HISTORY — DX: Hyperkalemia: E87.5

## 2013-08-01 HISTORY — DX: Chronic kidney disease, unspecified: N18.9

## 2013-08-17 ENCOUNTER — Inpatient Hospital Stay (HOSPITAL_COMMUNITY): Payer: BC Managed Care – PPO

## 2013-08-17 ENCOUNTER — Inpatient Hospital Stay (HOSPITAL_BASED_OUTPATIENT_CLINIC_OR_DEPARTMENT_OTHER)
Admission: EM | Admit: 2013-08-17 | Discharge: 2013-08-25 | DRG: 603 | Disposition: A | Discharge status: Skilled Nursing Facility | Payer: BC Managed Care – PPO | Attending: Internal Medicine | Admitting: Internal Medicine

## 2013-08-17 ENCOUNTER — Encounter (HOSPITAL_BASED_OUTPATIENT_CLINIC_OR_DEPARTMENT_OTHER): Payer: Self-pay | Admitting: Emergency Medicine

## 2013-08-17 ENCOUNTER — Emergency Department (HOSPITAL_BASED_OUTPATIENT_CLINIC_OR_DEPARTMENT_OTHER): Payer: BC Managed Care – PPO

## 2013-08-17 DIAGNOSIS — E11621 Type 2 diabetes mellitus with foot ulcer: Secondary | ICD-10-CM

## 2013-08-17 DIAGNOSIS — L97509 Non-pressure chronic ulcer of other part of unspecified foot with unspecified severity: Secondary | ICD-10-CM

## 2013-08-17 DIAGNOSIS — B950 Streptococcus, group A, as the cause of diseases classified elsewhere: Secondary | ICD-10-CM

## 2013-08-17 DIAGNOSIS — L03119 Cellulitis of unspecified part of limb: Principal | ICD-10-CM

## 2013-08-17 DIAGNOSIS — D649 Anemia, unspecified: Secondary | ICD-10-CM | POA: Diagnosis present

## 2013-08-17 DIAGNOSIS — Z981 Arthrodesis status: Secondary | ICD-10-CM

## 2013-08-17 DIAGNOSIS — Z7982 Long term (current) use of aspirin: Secondary | ICD-10-CM

## 2013-08-17 DIAGNOSIS — N184 Chronic kidney disease, stage 4 (severe): Secondary | ICD-10-CM | POA: Diagnosis present

## 2013-08-17 DIAGNOSIS — R112 Nausea with vomiting, unspecified: Secondary | ICD-10-CM | POA: Diagnosis present

## 2013-08-17 DIAGNOSIS — I129 Hypertensive chronic kidney disease with stage 1 through stage 4 chronic kidney disease, or unspecified chronic kidney disease: Secondary | ICD-10-CM | POA: Diagnosis present

## 2013-08-17 DIAGNOSIS — A491 Streptococcal infection, unspecified site: Secondary | ICD-10-CM | POA: Diagnosis present

## 2013-08-17 DIAGNOSIS — E119 Type 2 diabetes mellitus without complications: Secondary | ICD-10-CM

## 2013-08-17 DIAGNOSIS — Z113 Encounter for screening for infections with a predominantly sexual mode of transmission: Secondary | ICD-10-CM

## 2013-08-17 DIAGNOSIS — I891 Lymphangitis: Secondary | ICD-10-CM

## 2013-08-17 DIAGNOSIS — L02419 Cutaneous abscess of limb, unspecified: Principal | ICD-10-CM | POA: Diagnosis present

## 2013-08-17 DIAGNOSIS — Z87891 Personal history of nicotine dependence: Secondary | ICD-10-CM

## 2013-08-17 DIAGNOSIS — Z6825 Body mass index (BMI) 25.0-25.9, adult: Secondary | ICD-10-CM

## 2013-08-17 DIAGNOSIS — E860 Dehydration: Secondary | ICD-10-CM

## 2013-08-17 DIAGNOSIS — R7881 Bacteremia: Secondary | ICD-10-CM

## 2013-08-17 DIAGNOSIS — R700 Elevated erythrocyte sedimentation rate: Secondary | ICD-10-CM

## 2013-08-17 DIAGNOSIS — L0291 Cutaneous abscess, unspecified: Secondary | ICD-10-CM

## 2013-08-17 DIAGNOSIS — L0390 Cellulitis, unspecified: Secondary | ICD-10-CM

## 2013-08-17 DIAGNOSIS — I10 Essential (primary) hypertension: Secondary | ICD-10-CM | POA: Diagnosis present

## 2013-08-17 DIAGNOSIS — E785 Hyperlipidemia, unspecified: Secondary | ICD-10-CM | POA: Diagnosis present

## 2013-08-17 DIAGNOSIS — Z794 Long term (current) use of insulin: Secondary | ICD-10-CM

## 2013-08-17 DIAGNOSIS — E875 Hyperkalemia: Secondary | ICD-10-CM | POA: Diagnosis present

## 2013-08-17 DIAGNOSIS — N179 Acute kidney failure, unspecified: Secondary | ICD-10-CM

## 2013-08-17 HISTORY — DX: Anemia, unspecified: D64.9

## 2013-08-17 HISTORY — DX: Cellulitis, unspecified: L03.90

## 2013-08-17 HISTORY — DX: Essential (primary) hypertension: I10

## 2013-08-17 HISTORY — DX: Unspecified osteoarthritis, unspecified site: M19.90

## 2013-08-17 HISTORY — DX: Hyperkalemia: E87.5

## 2013-08-17 HISTORY — DX: Chronic kidney disease, unspecified: N18.9

## 2013-08-17 HISTORY — DX: Dehydration: E86.0

## 2013-08-17 LAB — CREATININE, URINE, RANDOM: CREATININE, URINE: 66.89 mg/dL

## 2013-08-17 LAB — COMPREHENSIVE METABOLIC PANEL
ALBUMIN: 4.1 g/dL (ref 3.5–5.2)
ALK PHOS: 71 U/L (ref 39–117)
ALT: 15 U/L (ref 0–53)
ALT: 14 U/L (ref 0–53)
AST: 17 U/L (ref 0–37)
AST: 19 U/L (ref 0–37)
Albumin: 3.5 g/dL (ref 3.5–5.2)
Alkaline Phosphatase: 60 U/L (ref 39–117)
BILIRUBIN TOTAL: 0.4 mg/dL (ref 0.3–1.2)
BUN: 50 mg/dL — AB (ref 6–23)
BUN: 40 mg/dL — ABNORMAL HIGH (ref 6–23)
CHLORIDE: 98 meq/L (ref 96–112)
CO2: 21 meq/L (ref 19–32)
CO2: 21 mEq/L (ref 19–32)
Calcium: 9.7 mg/dL (ref 8.4–10.5)
Calcium: 9.5 mg/dL (ref 8.4–10.5)
Chloride: 102 mEq/L (ref 96–112)
Creatinine, Ser: 1.6 mg/dL — ABNORMAL HIGH (ref 0.50–1.35)
Creatinine, Ser: 1.43 mg/dL — ABNORMAL HIGH (ref 0.50–1.35)
GFR calc Af Amer: 54 mL/min — ABNORMAL LOW (ref >90)
GFR calc non Af Amer: 47 mL/min — ABNORMAL LOW (ref >90)
GFR, EST AFRICAN AMERICAN: 47 mL/min — AB (ref >90)
GFR, EST NON AFRICAN AMERICAN: 41 mL/min — AB (ref >90)
GLUCOSE: 337 mg/dL — AB (ref 70–99)
Glucose, Bld: 233 mg/dL — ABNORMAL HIGH (ref 70–99)
POTASSIUM: 5.4 meq/L — AB (ref 3.7–5.3)
Potassium: 4.7 mEq/L (ref 3.7–5.3)
SODIUM: 137 meq/L (ref 137–147)
Sodium: 134 mEq/L — ABNORMAL LOW (ref 137–147)
TOTAL PROTEIN: 6.7 g/dL (ref 6.0–8.3)
Total Bilirubin: 0.4 mg/dL (ref 0.3–1.2)
Total Protein: 7.3 g/dL (ref 6.0–8.3)

## 2013-08-17 LAB — CBC WITH DIFFERENTIAL/PLATELET
BLASTS: 0 %
Band Neutrophils: 20 % — ABNORMAL HIGH (ref 0–10)
Basophils Absolute: 0 10*3/uL (ref 0.0–0.1)
Basophils Relative: 0 % (ref 0–1)
Eosinophils Absolute: 0 10*3/uL (ref 0.0–0.7)
Eosinophils Relative: 0 % (ref 0–5)
HCT: 32.9 % — ABNORMAL LOW (ref 39.0–52.0)
Hemoglobin: 10.7 g/dL — ABNORMAL LOW (ref 13.0–17.0)
LYMPHS PCT: 1 % — AB (ref 12–46)
Lymphs Abs: 0.2 10*3/uL — ABNORMAL LOW (ref 0.7–4.0)
MCH: 31.4 pg (ref 26.0–34.0)
MCHC: 32.5 g/dL (ref 30.0–36.0)
MCV: 96.5 fL (ref 78.0–100.0)
MYELOCYTES: 0 %
Metamyelocytes Relative: 0 %
Monocytes Absolute: 0 10*3/uL — ABNORMAL LOW (ref 0.1–1.0)
Monocytes Relative: 0 % — ABNORMAL LOW (ref 3–12)
NEUTROS ABS: 20.1 10*3/uL — AB (ref 1.7–7.7)
NEUTROS PCT: 79 % — AB (ref 43–77)
NRBC: 0 /100{WBCs}
PLATELETS: 186 10*3/uL (ref 150–400)
PROMYELOCYTES ABS: 0 %
RBC: 3.41 MIL/uL — AB (ref 4.22–5.81)
RDW: 13.4 % (ref 11.5–15.5)
WBC Morphology: INCREASED
WBC: 20.3 10*3/uL — ABNORMAL HIGH (ref 4.0–10.5)

## 2013-08-17 LAB — URINALYSIS, ROUTINE W REFLEX MICROSCOPIC
Bilirubin Urine: NEGATIVE
Glucose, UA: 250 mg/dL — AB
Ketones, ur: NEGATIVE mg/dL
LEUKOCYTES UA: NEGATIVE
NITRITE: NEGATIVE
PROTEIN: 30 mg/dL — AB
Specific Gravity, Urine: 1.024 (ref 1.005–1.030)
Urobilinogen, UA: 0.2 mg/dL (ref 0.0–1.0)
pH: 5 (ref 5.0–8.0)

## 2013-08-17 LAB — SODIUM, URINE, RANDOM: Sodium, Ur: 51 mEq/L

## 2013-08-17 LAB — RETICULOCYTES
RBC.: 3.64 MIL/uL — ABNORMAL LOW (ref 4.22–5.81)
Retic Count, Absolute: 25.5 10*3/uL (ref 19.0–186.0)
Retic Ct Pct: 0.7 % (ref 0.4–3.1)

## 2013-08-17 LAB — CBC
HCT: 34.2 % — ABNORMAL LOW (ref 39.0–52.0)
Hemoglobin: 11.1 g/dL — ABNORMAL LOW (ref 13.0–17.0)
MCH: 30.4 pg (ref 26.0–34.0)
MCHC: 32.5 g/dL (ref 30.0–36.0)
MCV: 93.7 fL (ref 78.0–100.0)
PLATELETS: 161 10*3/uL (ref 150–400)
RBC: 3.65 MIL/uL — AB (ref 4.22–5.81)
RDW: 13.7 % (ref 11.5–15.5)
WBC: 20.3 10*3/uL — ABNORMAL HIGH (ref 4.0–10.5)

## 2013-08-17 LAB — GLUCOSE, CAPILLARY: GLUCOSE-CAPILLARY: 232 mg/dL — AB (ref 70–99)

## 2013-08-17 LAB — CK: CK TOTAL: 405 U/L — AB (ref 7–232)

## 2013-08-17 LAB — URINE MICROSCOPIC-ADD ON

## 2013-08-17 LAB — CBG MONITORING, ED: Glucose-Capillary: 303 mg/dL — ABNORMAL HIGH (ref 70–99)

## 2013-08-17 LAB — TROPONIN I: Troponin I: <0.3 ng/mL (ref <0.30)

## 2013-08-17 MED ORDER — SODIUM CHLORIDE 0.9 % IJ SOLN
3.0000 mL | Freq: Two times a day (BID) | INTRAMUSCULAR | Status: DC
  Administered 2013-08-20 – 2013-08-25 (×4): 3 mL via INTRAVENOUS

## 2013-08-17 MED ORDER — GLIPIZIDE ER 5 MG PO TB24
5.0000 mg | ORAL_TABLET | Freq: Every day | ORAL | Status: DC
  Filled 2013-08-17: qty 1

## 2013-08-17 MED ORDER — ONDANSETRON HCL 4 MG/2ML IJ SOLN
INTRAMUSCULAR | Status: AC
Start: 2013-08-17 — End: 2013-08-18
  Filled 2013-08-17: qty 2

## 2013-08-17 MED ORDER — ACETAMINOPHEN 325 MG PO TABS
650.0000 mg | ORAL_TABLET | Freq: Four times a day (QID) | ORAL | Status: DC | PRN
  Administered 2013-08-20: 650 mg via ORAL
  Filled 2013-08-17: qty 2

## 2013-08-17 MED ORDER — INSULIN ASPART 100 UNIT/ML ~~LOC~~ SOLN
0.0000 [IU] | Freq: Three times a day (TID) | SUBCUTANEOUS | Status: DC
  Administered 2013-08-18 – 2013-08-19 (×2): 2 [IU] via SUBCUTANEOUS
  Administered 2013-08-19 – 2013-08-20 (×3): 1 [IU] via SUBCUTANEOUS
  Administered 2013-08-21: 3 [IU] via SUBCUTANEOUS
  Administered 2013-08-21 – 2013-08-22 (×2): 1 [IU] via SUBCUTANEOUS
  Administered 2013-08-23: 3 [IU] via SUBCUTANEOUS
  Administered 2013-08-24 (×2): 2 [IU] via SUBCUTANEOUS
  Administered 2013-08-25: 1 [IU] via SUBCUTANEOUS

## 2013-08-17 MED ORDER — METOCLOPRAMIDE HCL 5 MG/ML IJ SOLN: 10.0000 mg | Freq: Four times a day (QID) | INTRAMUSCULAR | Status: DC | PRN

## 2013-08-17 MED ORDER — SODIUM CHLORIDE 0.9 % IV SOLN
Freq: Once | INTRAVENOUS | Status: AC
  Administered 2013-08-17: 22:00:00 via INTRAVENOUS

## 2013-08-17 MED ORDER — DOCUSATE SODIUM 100 MG PO CAPS
100.0000 mg | ORAL_CAPSULE | Freq: Two times a day (BID) | ORAL | Status: DC
  Administered 2013-08-17 – 2013-08-25 (×16): 100 mg via ORAL
  Filled 2013-08-17 (×18): qty 1

## 2013-08-17 MED ORDER — ONDANSETRON HCL 4 MG/2ML IJ SOLN: 4.0000 mg | Freq: Four times a day (QID) | INTRAMUSCULAR | Status: DC | PRN

## 2013-08-17 MED ORDER — VANCOMYCIN HCL 10 G IV SOLR
1250.0000 mg | INTRAVENOUS | Status: DC
  Administered 2013-08-18 – 2013-08-21 (×4): 1250 mg via INTRAVENOUS
  Filled 2013-08-17 (×5): qty 1250

## 2013-08-17 MED ORDER — VANCOMYCIN HCL IN DEXTROSE 1-5 GM/200ML-% IV SOLN
1000.0000 mg | Freq: Once | INTRAVENOUS | Status: AC
  Administered 2013-08-17: 1000 mg via INTRAVENOUS
  Filled 2013-08-17: qty 200

## 2013-08-17 MED ORDER — SODIUM CHLORIDE 0.9 % IV SOLN: INTRAVENOUS | Status: DC

## 2013-08-17 MED ORDER — INSULIN DETEMIR 100 UNIT/ML ~~LOC~~ SOLN
16.0000 [IU] | Freq: Every day | SUBCUTANEOUS | Status: DC
  Administered 2013-08-17 – 2013-08-18 (×2): 16 [IU] via SUBCUTANEOUS
  Filled 2013-08-17 (×2): qty 0.16

## 2013-08-17 MED ORDER — HYDROCODONE-ACETAMINOPHEN 5-325 MG PO TABS
1.0000 | ORAL_TABLET | ORAL | Status: DC | PRN
  Administered 2013-08-18 – 2013-08-19 (×3): 1 via ORAL
  Administered 2013-08-21 – 2013-08-25 (×11): 2 via ORAL
  Filled 2013-08-17 (×2): qty 2
  Filled 2013-08-17 (×2): qty 1
  Filled 2013-08-17 (×2): qty 2
  Filled 2013-08-17: qty 1
  Filled 2013-08-17 (×7): qty 2

## 2013-08-17 MED ORDER — ONDANSETRON HCL 4 MG/2ML IJ SOLN
4.0000 mg | Freq: Once | INTRAMUSCULAR | Status: AC
  Administered 2013-08-17: 4 mg via INTRAVENOUS

## 2013-08-17 MED ORDER — PIPERACILLIN-TAZOBACTAM 3.375 G IVPB
3.3750 g | Freq: Once | INTRAVENOUS | Status: AC
  Administered 2013-08-17: 3.375 g via INTRAVENOUS

## 2013-08-17 MED ORDER — SODIUM CHLORIDE 0.9 % IV SOLN
Freq: Once | INTRAVENOUS | Status: AC
  Administered 2013-08-17: 15:00:00 via INTRAVENOUS

## 2013-08-17 MED ORDER — INSULIN ASPART 100 UNIT/ML ~~LOC~~ SOLN
0.0000 [IU] | Freq: Every day | SUBCUTANEOUS | Status: DC
  Administered 2013-08-17 – 2013-08-21 (×3): 2 [IU] via SUBCUTANEOUS
  Administered 2013-08-22: 3 [IU] via SUBCUTANEOUS

## 2013-08-17 MED ORDER — VANCOMYCIN HCL 500 MG IV SOLR
500.0000 mg | INTRAVENOUS | Status: AC
  Administered 2013-08-17: 500 mg via INTRAVENOUS
  Filled 2013-08-17: qty 500

## 2013-08-17 MED ORDER — ONDANSETRON HCL 4 MG PO TABS: 4.0000 mg | ORAL_TABLET | Freq: Four times a day (QID) | ORAL | Status: DC | PRN

## 2013-08-17 MED ORDER — PIPERACILLIN-TAZOBACTAM 3.375 G IVPB
3.3750 g | Freq: Three times a day (TID) | INTRAVENOUS | Status: DC
  Administered 2013-08-17 – 2013-08-22 (×14): 3.375 g via INTRAVENOUS
  Filled 2013-08-17 (×16): qty 50

## 2013-08-17 MED ORDER — ASPIRIN 81 MG PO CHEW
81.0000 mg | CHEWABLE_TABLET | Freq: Every day | ORAL | Status: DC
  Administered 2013-08-18 – 2013-08-25 (×8): 81 mg via ORAL
  Filled 2013-08-17 (×8): qty 1

## 2013-08-17 MED ORDER — ENOXAPARIN SODIUM 30 MG/0.3ML ~~LOC~~ SOLN
30.0000 mg | SUBCUTANEOUS | Status: DC
  Administered 2013-08-17 – 2013-08-18 (×2): 30 mg via SUBCUTANEOUS
  Filled 2013-08-17 (×3): qty 0.3

## 2013-08-17 MED ORDER — TETRAHYDROZOLINE HCL 0.05 % OP SOLN
1.0000 [drp] | Freq: Two times a day (BID) | OPHTHALMIC | Status: DC
  Administered 2013-08-17 – 2013-08-25 (×16): 1 [drp] via OPHTHALMIC
  Filled 2013-08-17: qty 15

## 2013-08-17 MED ORDER — SIMVASTATIN 20 MG PO TABS
20.0000 mg | ORAL_TABLET | Freq: Every evening | ORAL | Status: DC
  Administered 2013-08-17 – 2013-08-24 (×8): 20 mg via ORAL
  Filled 2013-08-17 (×9): qty 1

## 2013-08-17 MED ORDER — ONDANSETRON HCL 4 MG/2ML IJ SOLN
4.0000 mg | Freq: Once | INTRAMUSCULAR | Status: AC
  Administered 2013-08-17: 4 mg via INTRAVENOUS
  Filled 2013-08-17: qty 2

## 2013-08-17 MED ORDER — ACETAMINOPHEN 650 MG RE SUPP
650.0000 mg | Freq: Four times a day (QID) | RECTAL | Status: DC | PRN
  Filled 2013-08-17: qty 1

## 2013-08-17 NOTE — H&P (Signed)
PCP:  [name], MD    Chief Complaint:  Right leg pain  HPI: Jason Mcpherson is a 73 y.o. male   has a past medical history of Diabetes mellitus without complication and Hyperlipemia.   Presented with  Patient developed redness and swelling of right leg since this AM. He developed nausea and profuse vomiting at the same time. Patient denies any hx injury. He tried to take some aspirin and tylenol for pain. States he was told not to take NSAIDS in the past to avoid kidney damage. Last night he had some chills asa well but no fever. He has hx of ulceration on left foot but that has healed well. He notes small ulcer on second toe of right foot today. patient presented to [facility] and was found to be in ARF with Cr of 1.6 and K of 5.4 and evidence of right leg cellulitis he was started on vanc and Zosyn and transferred to [facility].  Deneis any diarrhea  no chest pain.  Review of Systems:     Pertinent positives include: chills,  nausea, vomiting, right leg pain  Constitutional:  No weight loss, night sweats, Fevers, fatigue, weight loss  HEENT:  No headaches, Difficulty swallowing,Tooth/dental problems,Sore throat,  No sneezing, itching, ear ache, nasal congestion, post nasal drip,  Cardio-vascular:  No chest pain, Orthopnea, PND, anasarca, dizziness, palpitations.no Bilateral lower extremity swelling  GI:  No heartburn, indigestion, abdominal pain,, diarrhea, change in bowel habits, loss of appetite, melena, blood in stool, hematemesis Resp:  no shortness of breath at rest. No dyspnea on exertion, No excess mucus, no productive cough, No non-productive cough, No coughing up of blood.No change in color of mucus.No wheezing. Skin:  no rash or lesions. No jaundice GU:  no dysuria, change in color of urine, no urgency or frequency. No straining to urinate.  No flank pain.  Musculoskeletal:  No joint pain or no joint swelling. No decreased range of motion. No back pain.  Psych:  No change  in mood or affect. No depression or anxiety. No memory loss.  Neuro: no localizing neurological complaints, no tingling, no weakness, no double vision, no gait abnormality, no slurred speech, no confusion  Otherwise ROS are negative except for above, 10 systems were reviewed  Past Medical History: Past Medical History  Diagnosis Date  . Diabetes mellitus without complication   . Hyperlipemia    Past Surgical History  Procedure Laterality Date  . Back surgery      10 years ago  . Anterior cervical decomp/discectomy fusion N/A 10/17/2012    Procedure: ANTERIOR CERVICAL DECOMPRESSION/DISCECTOMY FUSION 2 LEVELS;  Surgeon: Charlie Pitter, MD;  Location: [facility];  Service: Neurosurgery;  Laterality: N/A;  Cervical three-four, four-five anterior cervical decompression fusion with allograft plating  . Cervical disc surgery       Medications: Prior to Admission medications   Medication Sig Start Date End Date Taking? Authorizing Provider  aspirin 81 MG chewable tablet Chew 81 mg by mouth daily.   Yes Historical Provider, MD  ferrous sulfate 325 (65 FE) MG tablet Take 325 mg by mouth daily with breakfast.   Yes Historical Provider, MD  glipiZIDE (GLUCOTROL XL) 5 MG 24 hr tablet Take 5 mg by mouth daily.   Yes Historical Provider, MD  insulin detemir (LEVEMIR) 100 UNIT/ML injection Inject 16 Units into the skin at bedtime.    Yes Historical Provider, MD  losartan (COZAAR) 100 MG tablet Take 100 mg by mouth daily.  Yes Historical Provider, MD  metFORMIN (GLUCOPHAGE) 1000 MG tablet Take 1,000 mg by mouth 2 (two) times daily with a meal.   Yes Historical Provider, MD  simvastatin (ZOCOR) 20 MG tablet Take 20 mg by mouth every evening.   Yes Historical Provider, MD  Tetrahydrozoline HCl (EYE DROPS OP) Place 1 drop into both eyes 2 (two) times daily as needed (dry eyes).   Yes Historical Provider, MD    Allergies:  No Known Allergies  Social History:  Ambulatory   cane Lives at home with  wife   reports that he quit smoking about 15 years ago. He does not have any smokeless tobacco history on file. He reports that he does not drink alcohol or use illicit drugs.   Family History: family history includes Cancer - Colon in his mother; Cancer - Prostate in his father.    Physical Exam: Patient Vitals for the past 24 hrs:  BP Temp Temp src Pulse Resp SpO2 Height Weight  08/17/13 1957 130/76 mmHg 98.3 F (36.8 C) Oral 101 18 100 % - -  08/17/13 1821 130/61 mmHg 98.9 F (37.2 C) - 99 18 96 % - -  08/17/13 1553 140/69 mmHg 99.7 F (37.6 C) Oral 110 20 96 % - -  08/17/13 1343 141/59 mmHg 98.9 F (37.2 C) Oral 111 20 100 % 5\' 8"  (1.727 m) 77.111 kg (170 lb)    1. General:  in No Acute distress 2. Psychological: Alert and Oriented 3. Head/ENT:   Dry Mucous Membranes                          Head Non traumatic, neck supple                          Normal Dentition 4. SKIN:  decreased Skin turgor,  Skin clean redness and warmth over right peritibial region. samll ulcer ont he bottom of second right toe 5. Heart: Regular rate and rhythm no Murmur, Rub or gallop 6. Lungs: Clear to auscultation bilaterally, no wheezes or crackles   7. Abdomen: Soft, non-tender, Non distended 8. Lower extremities: no clubbing, cyanosis, or edema 9. Neurologically Grossly intact, moving all 4 extremities equally 10. MSK: Normal range of motion  body mass index is 25.85 kg/(m^2).   Labs on Admission:   Recent Labs  08/17/13 1410  NA 134*  K 5.4*  CL 98  CO2 21  GLUCOSE 337*  BUN 50*  CREATININE 1.60*  CALCIUM 9.7    Recent Labs  08/17/13 1410  AST 17  ALT 15  ALKPHOS 71  BILITOT 0.4  PROT 7.3  ALBUMIN 4.1   No results found for this basename: LIPASE, AMYLASE,  in the last 72 hours  Recent Labs  08/17/13 1410  WBC 20.3*  NEUTROABS 20.1*  HGB 10.7*  HCT 32.9*  MCV 96.5  PLT 186   No results found for this basename: CKTOTAL, CKMB, CKMBINDEX, TROPONINI,  in the last  72 hours No results found for this basename: TSH, T4TOTAL, FREET3, T3FREE, THYROIDAB,  in the last 72 hours No results found for this basename: VITAMINB12, FOLATE, FERRITIN, TIBC, IRON, RETICCTPCT,  in the last 72 hours No results found for this basename: HGBA1C    Estimated Creatinine Clearance: 39.2 ml/min (by C-G formula based on Cr of 1.6). ABG No results found for this basename: phart, pco2, po2, hco3, tco2, acidbasedef, o2sat     No  results found for this basename: DDIMER     Other results:  I have pearsonaly reviewed this: ECG REPORT  Rate: 101  Rhythm: sinus tachy ST&T Change: no ischemia     Cultures: No results found for this basename: sdes, specrequest, cult, reptstatus       Radiological Exams on Admission: US Venous Img Lower Unilateral Right  08/17/2013   CLINICAL DATA:  Patient complains of redness and warmth for 1 day. Streaking and redness throughout the leg. The patient denies pain.  EXAM: Right LOWER EXTREMITY VENOUS DOPPLER ULTRASOUND  TECHNIQUE: Gray-scale sonography with graded compression, as well as color Doppler and duplex ultrasound were performed to evaluate the lower extremity deep venous systems from the level of the common femoral vein and including the common femoral, femoral, profunda femoral, popliteal and calf veins including the posterior tibial, peroneal and gastrocnemius veins when visible. The superficial great saphenous vein was also interrogated. Spectral Doppler was utilized to evaluate flow at rest and with distal augmentation maneuvers in the common femoral, femoral and popliteal veins.  COMPARISON:  None.  FINDINGS: Common Femoral Vein: No evidence of thrombus. Normal compressibility, respiratory phasicity and response to augmentation.  Saphenofemoral Junction: No evidence of thrombus. Normal compressibility and flow on color Doppler imaging.  Profunda Femoral Vein: No evidence of thrombus. Normal compressibility and flow on color Doppler  imaging.  Femoral Vein: No evidence of thrombus. Normal compressibility, respiratory phasicity and response to augmentation.  Popliteal Vein: No evidence of thrombus. Normal compressibility, respiratory phasicity and response to augmentation.  Calf Veins: Limited evaluation.  Superficial Great Saphenous Vein: No evidence of thrombus. Normal compressibility and flow on color Doppler imaging.  Other Findings: Multiple varicosities are identified within the calf, none identified containing clots. Complex fluid collection is identified in the right popliteal fossa region measuring 6.8 x 1.7 x 2.9 centimeters.  IMPRESSION: 1. No evidence for occlusive deep venous thrombosis. 2. Calf varicosities. 3. Popliteal fossa fluid collection.   Electronically Signed   By: Shon Hale M.D.   On: 08/17/2013 15:25    Chart has been reviewed  Assessment/Plan  73 yo M w hx of DM here with ARF and hyperkalemia in the setting of dehydration and right leg cellulitis.   Present on Admission:  . Cellulitis - treat with  Vanc/zosyn , obtain plain films . DM2 (diabetes mellitus, type 2) - hold metfromin, continue levemir, SSI . HTN (hypertension) - continue home medications . Acute renal failure - likely due to dehydration, IVF, urine electrolytes . Dehydration - likely due to Nausea vomiting, give IVF . Nausea with vomiting - abdomen benign, no chest pain, likely in the setting of systemic response to cellulits but given advance age will obtain ECG and trop . Hyperkalemia - will repeat labs and treat if needed. Monitor on telemetry Anemia - obtain anemia panel, hemoccult stool somewhat worse from baseline   Prophylaxis:   Lovenox, Protonix  CODE STATUS:FULL CODE  Other plan as per orders.  I have spent a total of 55 min on this admission  Sophiana Milanese 08/17/2013, 8:47 PM

## 2013-08-17 NOTE — ED Notes (Signed)
C/o redness to right leg-first noticed yesterday-denies injury

## 2013-08-17 NOTE — ED Notes (Signed)
EMS at bedside. Pt transporting to [facility]

## 2013-08-17 NOTE — ED Notes (Signed)
Report given to receiving RN.

## 2013-08-17 NOTE — Progress Notes (Addendum)
ANTIBIOTIC CONSULT NOTE - INITIAL  Pharmacy Consult for  Vancomycin / Zosyn Indication: Cellulitis  No Known Allergies  Patient Measurements: Height: 5\' 8"  (172.7 cm) Weight: 170 lb (77.111 kg) IBW/kg (Calculated) : 68.4   Vital Signs: Temp: 98.9 F (37.2 C) (04/17 1821) Temp src: Oral (04/17 1553) BP: 130/61 mmHg (04/17 1821) Pulse Rate: 99 (04/17 1821) Intake/Output from previous day:   Intake/Output from this shift:    Labs:  Recent Labs  08/17/13 1410  WBC 20.3*  HGB 10.7*  PLT 186  CREATININE 1.60*   Estimated Creatinine Clearance: 39.2 ml/min (by C-G formula based on Cr of 1.6). No results found for this basename: VANCOTROUGH, VANCOPEAK, VANCORANDOM, GENTTROUGH, GENTPEAK, GENTRANDOM, TOBRATROUGH, TOBRAPEAK, TOBRARND, AMIKACINPEAK, AMIKACINTROU, AMIKACIN,  in the last 72 hours   Microbiology: No results found for this or any previous visit (from the past 720 hour(s)).  Medical History: Past Medical History  Diagnosis Date  . Diabetes mellitus without complication   . Hyperlipemia      Assessment: 73yom admitted for cellulitis RLE with WBC 20, worsening renal fx Cr  1.6  ( baseline 1 -10 months ago).  Plan to start broad spectrum ABX.  Zosyn 3.375 Gm IV x1 givennin ED and Vancomycin 1gm x1 given in ED.    Goal of Therapy:  erradication of infection  Plan:  Zosyn 3.375 GM IV Q8hr EI Vancomycin 500 mg IV x1 = 1500 mg IV load and then 1250mg  IV q24 start tomorrow  Bonnita Nasuti Pharm.D. CPP, BCPS Clinical Pharmacist [phone] 08/17/2013 7:06 PM

## 2013-08-17 NOTE — ED Notes (Signed)
[facility] for transport to [facility]

## 2013-08-17 NOTE — ED Notes (Signed)
Present for right leg redness and swelling, claims all started yesterday. Reports pain only on ambulation. Right leg is red, swollen with 3+ edema, warm to touch, appears infected. Similar skin characteristic noted on right upper groin.

## 2013-08-17 NOTE — ED Provider Notes (Signed)
CSN: [id number]     Arrival date & time 08/17/13  1332 History  This chart was scribed for Ezequiel Essex, MD by Roe Coombs, ED Scribe. The patient was seen in room MH01/MH01. Patient's care was started at 1:48 PM.  Chief Complaint  Patient presents with  . Leg Problem    The history is provided by the patient. No language interpreter was used.    HPI Comments: Jason Mcpherson is a 73 y.o. male who presents to the Emergency Department complaining of constant redness and moderate pain to his right lower leg that began last night. He says that he has had similar symptoms in the past to his left leg and was diagnosed with phlebitis - he did not require hospitalization to treat this. Patient says that current symptoms in his right leg seem worse than his past episode of phlebitis on the left. Patient reports associated chills last night that have improved. He also had an episode of vomiting this morning. He says that he has a normal appetite and can tolerate food and fluids. Patient denies fever, shortness of breath, chest pain, or abdominal pain. He denies any obvious injury or trauma, and denies fall. He has a medical history of DM. He has no history of kidney disease. He has no known allergies.   Past Medical History  Diagnosis Date  . Diabetes mellitus without complication   . Hyperlipemia    Past Surgical History  Procedure Laterality Date  . Back surgery      10 years ago  . Anterior cervical decomp/discectomy fusion N/A 10/17/2012    Procedure: ANTERIOR CERVICAL DECOMPRESSION/DISCECTOMY FUSION 2 LEVELS;  Surgeon: Charlie Pitter, MD;  Location: [facility];  Service: Neurosurgery;  Laterality: N/A;  Cervical three-four, four-five anterior cervical decompression fusion with allograft plating  . Cervical disc surgery     No family history on file. History  Substance Use Topics  . Smoking status: Former Smoker    Quit date: 10/12/1997  . Smokeless tobacco: Not on file  . Alcohol Use:  No    Review of Systems A complete 10 system review of systems was obtained and all systems are negative except as noted in the HPI and PMH.    Allergies  Review of patient's allergies indicates no known allergies.  Home Medications   Prior to Admission medications   Medication Sig Start Date End Date Taking? Authorizing Provider  aspirin 81 MG chewable tablet Chew 81 mg by mouth daily.    Historical Provider, MD  cyclobenzaprine (FLEXERIL) 10 MG tablet Take 1 tablet (10 mg total) by mouth 3 (three) times daily as needed for muscle spasms. 10/18/12   Charlie Pitter, MD  ferrous sulfate 325 (65 FE) MG tablet Take 325 mg by mouth daily with breakfast.    Historical Provider, MD  glipiZIDE (GLUCOTROL XL) 5 MG 24 hr tablet Take 5 mg by mouth daily.    Historical Provider, MD  insulin detemir (LEVEMIR) 100 UNIT/ML injection Inject 16 Units into the skin 2 (two) times daily.    Historical Provider, MD  losartan (COZAAR) 100 MG tablet Take 100 mg by mouth daily.    Historical Provider, MD  metFORMIN (GLUCOPHAGE) 1000 MG tablet Take 1,000 mg by mouth 2 (two) times daily with a meal.    Historical Provider, MD  simvastatin (ZOCOR) 20 MG tablet Take 20 mg by mouth every evening.    Historical Provider, MD   Triage Vitals: BP 141/59  Pulse 111  Temp(Src) 98.9 F (37.2 C) (Oral)  Resp 20  Ht 5\' 8"  (1.727 m)  Wt 170 lb (77.111 kg)  BMI 25.85 kg/m2  SpO2 100% Physical Exam  Constitutional: He is oriented to person, place, and time. He appears well-developed and well-nourished. No distress.  HENT:  Head: Normocephalic and atraumatic.  Mouth/Throat: Oropharynx is clear and moist.  Eyes: Conjunctivae and EOM are normal. Pupils are equal, round, and reactive to light.  Neck: Normal range of motion. Neck supple.  Cardiovascular: Normal rate, regular rhythm and normal heart sounds.  Exam reveals no gallop and no friction rub.   No murmur heard. tachycardic  Pulmonary/Chest: Effort normal and  breath sounds normal. No respiratory distress. He has no wheezes. He has no rales.  Abdominal: Soft. There is no tenderness.  Musculoskeletal: Normal range of motion.  Ulceration to the tip of the right second toe. Toe is erythematous. Circumferential erythema with petechial appearance to the right lower leg. Streaking erythema to right medial thigh extending to right groin. Does not involve scrotum, testicles or perineum.  Neurological: He is alert and oriented to person, place, and time.  Skin: There is erythema.  Psychiatric: He has a normal mood and affect. His behavior is normal.        ED Course  Procedures (including critical care time) DIAGNOSTIC STUDIES: Oxygen Saturation is 100% on room air, normal by my interpretation.    COORDINATION OF CARE: 1:58 PM- Will order [place] of right lower leg, vancomycin, Zosyn, CBC with diff, CMP. Patient informed of current plan for treatment and evaluation and agrees with plan at this time.  2:37 PM - Was informed by ED staff that patient is actively vomiting. Will order IV fluids and Zofran ad check CBG.    Labs Review Labs Reviewed  CBC WITH DIFFERENTIAL - Abnormal; Notable for the following:    WBC 20.3 (*)    RBC 3.41 (*)    Hemoglobin 10.7 (*)    HCT 32.9 (*)    Neutrophils Relative % 79 (*)    Lymphocytes Relative 1 (*)    Monocytes Relative 0 (*)    Band Neutrophils 20 (*)    Neutro Abs 20.1 (*)    Lymphs Abs 0.2 (*)    Monocytes Absolute 0.0 (*)    All other components within normal limits  COMPREHENSIVE METABOLIC PANEL - Abnormal; Notable for the following:    Sodium 134 (*)    Potassium 5.4 (*)    Glucose, Bld 337 (*)    BUN 50 (*)    Creatinine, Ser 1.60 (*)    GFR calc non Af Amer 41 (*)    GFR calc Af Amer 47 (*)    All other components within normal limits  CBG MONITORING, ED - Abnormal; Notable for the following:    Glucose-Capillary 303 (*)    All other components within normal limits  CULTURE, BLOOD (ROUTINE  X 2)  CULTURE, BLOOD (ROUTINE X 2)    Imaging Review US Venous Img Lower Unilateral Right  08/17/2013   CLINICAL DATA:  Patient complains of redness and warmth for 1 day. Streaking and redness throughout the leg. The patient denies pain.  EXAM: Right LOWER EXTREMITY VENOUS DOPPLER ULTRASOUND  TECHNIQUE: Gray-scale sonography with graded compression, as well as color Doppler and duplex ultrasound were performed to evaluate the lower extremity deep venous systems from the level of the common femoral vein and including the common femoral, femoral, profunda femoral, popliteal and calf veins including the  posterior tibial, peroneal and gastrocnemius veins when visible. The superficial great saphenous vein was also interrogated. Spectral Doppler was utilized to evaluate flow at rest and with distal augmentation maneuvers in the common femoral, femoral and popliteal veins.  COMPARISON:  None.  FINDINGS: Common Femoral Vein: No evidence of thrombus. Normal compressibility, respiratory phasicity and response to augmentation.  Saphenofemoral Junction: No evidence of thrombus. Normal compressibility and flow on color Doppler imaging.  Profunda Femoral Vein: No evidence of thrombus. Normal compressibility and flow on color Doppler imaging.  Femoral Vein: No evidence of thrombus. Normal compressibility, respiratory phasicity and response to augmentation.  Popliteal Vein: No evidence of thrombus. Normal compressibility, respiratory phasicity and response to augmentation.  Calf Veins: Limited evaluation.  Superficial Great Saphenous Vein: No evidence of thrombus. Normal compressibility and flow on color Doppler imaging.  Other Findings: Multiple varicosities are identified within the calf, none identified containing clots. Complex fluid collection is identified in the right popliteal fossa region measuring 6.8 x 1.7 x 2.9 centimeters.  IMPRESSION: 1. No evidence for occlusive deep venous thrombosis. 2. Calf varicosities. 3.  Popliteal fossa fluid collection.   Electronically Signed   By: Shon Hale M.D.   On: 08/17/2013 15:25     EKG Interpretation None      MDM   Final diagnoses:  Cellulitis  Lymphangitis   1 day history diabetes, erythema the right leg extending to the thigh and groin. Patient states history of phlebitis presented similarly. Denies any chest pain or shortness of breath.  Patient with cellulitis and lymphangitis of RLE. No CP or SOB.  No fever.  WBC 20. Worsening renal function with K 5.4.  Hyperglycemia without DKA. Fluids and anitbioitics started. Cultures sent. Doppler negative for DVT. Concern for rapidly spreading infection thought patient appears nontoxic and stable at this time.  He is agreeable to admission for hydration and antibiotics. D/w Dr. Wyline Copas.  BP 140/69  Pulse 110  Temp(Src) 99.7 F (37.6 C) (Oral)  Resp 20  Ht 5\' 8"  (1.727 m)  Wt 170 lb (77.111 kg)  BMI 25.85 kg/m2  SpO2 96%   I personally performed the services described in this documentation, which was scribed in my presence. The recorded information has been reviewed and is accurate.    Ezequiel Essex, MD 08/17/13 [phone]

## 2013-08-17 NOTE — Progress Notes (Signed)
Discussed case with Dr. Wyvonnia Dusky from [facility] ED  Pt of Dr. Delfina Redwood presents with one day hx of RLE erythema and pain extending up thigh. Hx of DM. Currently afebrile. WBC 20k. Cr of 1.6 (was normal at baseline). Has ulceration at R second toe. Vanc and zosyn started in ED. Pt accepted to med-surg at [facility].

## 2013-08-18 LAB — PHOSPHORUS: Phosphorus: 2.7 mg/dL (ref 2.3–4.6)

## 2013-08-18 LAB — COMPREHENSIVE METABOLIC PANEL
ALT: 17 U/L (ref 0–53)
AST: 25 U/L (ref 0–37)
Albumin: 3.1 g/dL — ABNORMAL LOW (ref 3.5–5.2)
Alkaline Phosphatase: 63 U/L (ref 39–117)
BUN: 42 mg/dL — ABNORMAL HIGH (ref 6–23)
CALCIUM: 9.2 mg/dL (ref 8.4–10.5)
CO2: 21 meq/L (ref 19–32)
CREATININE: 1.48 mg/dL — AB (ref 0.50–1.35)
Chloride: 103 mEq/L (ref 96–112)
GFR, EST AFRICAN AMERICAN: 52 mL/min — AB (ref >90)
GFR, EST NON AFRICAN AMERICAN: 45 mL/min — AB (ref >90)
Glucose, Bld: 103 mg/dL — ABNORMAL HIGH (ref 70–99)
Potassium: 4.5 mEq/L (ref 3.7–5.3)
Sodium: 138 mEq/L (ref 137–147)
TOTAL PROTEIN: 6.4 g/dL (ref 6.0–8.3)
Total Bilirubin: 0.4 mg/dL (ref 0.3–1.2)

## 2013-08-18 LAB — GLUCOSE, CAPILLARY
Glucose-Capillary: 91 mg/dL (ref 70–99)
Glucose-Capillary: 170 mg/dL — ABNORMAL HIGH (ref 70–99)
Glucose-Capillary: 114 mg/dL — ABNORMAL HIGH (ref 70–99)
Glucose-Capillary: 130 mg/dL — ABNORMAL HIGH (ref 70–99)

## 2013-08-18 LAB — IRON AND TIBC
Iron: <10 ug/dL — ABNORMAL LOW (ref 42–135)
UIBC: 211 ug/dL (ref 125–400)

## 2013-08-18 LAB — CBC
HCT: 30.5 % — ABNORMAL LOW (ref 39.0–52.0)
Hemoglobin: 9.9 g/dL — ABNORMAL LOW (ref 13.0–17.0)
MCH: 30.4 pg (ref 26.0–34.0)
MCHC: 32.5 g/dL (ref 30.0–36.0)
MCV: 93.6 fL (ref 78.0–100.0)
Platelets: 154 10*3/uL (ref 150–400)
RBC: 3.26 MIL/uL — AB (ref 4.22–5.81)
RDW: 13.8 % (ref 11.5–15.5)
WBC: 19.3 10*3/uL — ABNORMAL HIGH (ref 4.0–10.5)

## 2013-08-18 LAB — TSH: TSH: 0.584 u[IU]/mL (ref 0.350–4.500)

## 2013-08-18 LAB — HEMOGLOBIN A1C
Hgb A1c MFr Bld: 6.7 % — ABNORMAL HIGH (ref <5.7)
MEAN PLASMA GLUCOSE: 146 mg/dL — AB (ref <117)

## 2013-08-18 LAB — FERRITIN: Ferritin: 442 ng/mL — ABNORMAL HIGH (ref 22–322)

## 2013-08-18 LAB — MAGNESIUM: MAGNESIUM: 1.8 mg/dL (ref 1.5–2.5)

## 2013-08-18 LAB — FOLATE

## 2013-08-18 LAB — VITAMIN B12: VITAMIN B 12: 330 pg/mL (ref 211–911)

## 2013-08-18 MED ORDER — SODIUM CHLORIDE 0.9 % IV SOLN
INTRAVENOUS | Status: DC
  Administered 2013-08-18: 18:00:00 via INTRAVENOUS
  Administered 2013-08-21: 20 mL/h via INTRAVENOUS
  Administered 2013-08-24: 20:00:00 via INTRAVENOUS

## 2013-08-18 NOTE — Progress Notes (Signed)
Nutrition Brief Note  Patient identified by Malnutrition Screening Tool   Ht: 5'8"  Wt: 170 lbs  BMI: 25.9  Diet: Carb modified   73 y.o. male has a past medical history of Diabetes mellitus without complication and Hyperlipemia. Presented with developed redness and swelling of right leg and nausea and profuse vomiting at the same time.  Pt reports a good appetite and eating well. Pt is eating 100% of his meals. Pt has not lost any weight recently per EPIC and patient report. Pt states his normal weight is around 168-170. Physical exam did not reveal any depletion of body fat or muscle mass.   No interventions warranted at this time. Please consult if needed.   Labs and medications reviewed.  No results found for this basename: [place], BS Nutrition Intern Pager: [phone]

## 2013-08-18 NOTE — Progress Notes (Signed)
From [place] lab - positive blood culture. Anaerobic positive gram+cocci & chains. Notified MD and is aware.

## 2013-08-18 NOTE — Progress Notes (Signed)
Agree. Sims Laday Barnett RD, LDN Pager: [phone] After Hours Pager: [phone]  

## 2013-08-18 NOTE — Progress Notes (Signed)
Subjective:c/o no pain   Objective: Vital signs in last 24 hours: Temp:  [98.3 F (36.8 C)-99.7 F (37.6 C)] 98.7 F (37.1 C) (04/18 0440) Pulse Rate:  [93-111] 93 (04/18 0440) Resp:  [18-20] 20 (04/18 0440) BP: (100-141)/(56-76) 111/60 mmHg (04/18 0440) SpO2:  [96 %-100 %] 97 % (04/18 0440) Weight:  [77.111 kg (170 lb)] 77.111 kg (170 lb) (04/17 1343) Weight change:  Last BM Date: 08/16/13  Intake/Output from previous day:   Intake/Output this shift:    General appearance: alert Resp: clear to auscultation bilaterally Cardio: regular rate and rhythm Extremities: right lower leg- reddness/ cellulitis changes.  Lab Results:  Recent Labs  08/17/13 1935 08/18/13 0620  WBC 20.3* 19.3*  HGB 11.1* 9.9*  HCT 34.2* 30.5*  PLT 161 154   BMET  Recent Labs  08/17/13 1935 08/18/13 0620  NA 137 138  K 4.7 4.5  CL 102 103  CO2 21 21  GLUCOSE 233* 103*  BUN 40* 42*  CREATININE 1.43* 1.48*  CALCIUM 9.5 9.2    Studies/Results: Dg Tibia/fibula Right  08/18/2013   CLINICAL DATA:  Evaluate for osteomyelitis.  EXAM: RIGHT TIBIA AND FIBULA - 2 VIEW  COMPARISON:  None.  FINDINGS: There is no evidence of fracture or other focal bone lesions. Soft tissues are unremarkable. Joint space narrowing and degenerative spurring within the right knee. No radiographic changes of osteomyelitis.  IMPRESSION: No acute bony abnormality.   Electronically Signed   By: Rolm Baptise M.D.   On: 08/18/2013 00:43   US Venous Img Lower Unilateral Right  08/17/2013   CLINICAL DATA:  Patient complains of redness and warmth for 1 day. Streaking and redness throughout the leg. The patient denies pain.  EXAM: Right LOWER EXTREMITY VENOUS DOPPLER ULTRASOUND  TECHNIQUE: Gray-scale sonography with graded compression, as well as color Doppler and duplex ultrasound were performed to evaluate the lower extremity deep venous systems from the level of the common femoral vein and including the common femoral, femoral,  profunda femoral, popliteal and calf veins including the posterior tibial, peroneal and gastrocnemius veins when visible. The superficial great saphenous vein was also interrogated. Spectral Doppler was utilized to evaluate flow at rest and with distal augmentation maneuvers in the common femoral, femoral and popliteal veins.  COMPARISON:  None.  FINDINGS: Common Femoral Vein: No evidence of thrombus. Normal compressibility, respiratory phasicity and response to augmentation.  Saphenofemoral Junction: No evidence of thrombus. Normal compressibility and flow on color Doppler imaging.  Profunda Femoral Vein: No evidence of thrombus. Normal compressibility and flow on color Doppler imaging.  Femoral Vein: No evidence of thrombus. Normal compressibility, respiratory phasicity and response to augmentation.  Popliteal Vein: No evidence of thrombus. Normal compressibility, respiratory phasicity and response to augmentation.  Calf Veins: Limited evaluation.  Superficial Great Saphenous Vein: No evidence of thrombus. Normal compressibility and flow on color Doppler imaging.  Other Findings: Multiple varicosities are identified within the calf, none identified containing clots. Complex fluid collection is identified in the right popliteal fossa region measuring 6.8 x 1.7 x 2.9 centimeters.  IMPRESSION: 1. No evidence for occlusive deep venous thrombosis. 2. Calf varicosities. 3. Popliteal fossa fluid collection.   Electronically Signed   By: Shon Hale M.D.   On: 08/17/2013 15:25   Dg Foot 2 Views Right  08/18/2013   CLINICAL DATA:  Evaluate for osteomyelitis.  EXAM: RIGHT FOOT - 2 VIEW  COMPARISON:  None.  FINDINGS: Degenerative changes throughout the right foot. Mild pes planus. Plantar calcaneal  spur. No fracture, subluxation or dislocation. No radiographic changes of osteomyelitis.  IMPRESSION: No acute bony abnormality.   Electronically Signed   By: Rolm Baptise M.D.   On: 08/18/2013 00:44    Medications: I have  reviewed the patient's current medications.  Assessment/Plan: Cellulitis - treat with Vanc/zosyn , xray of leg and foot- ok.; doppler negative for DVT. Marland Kitchen DM2 (diabetes mellitus, type 2) - hold metfromin, continue levemir, SSI; stop glipizide BS in 90-100 . HTN (hypertension) - off ARB due to ARF . Acute renal failure - likely due to dehydration,continue  IVF, urine electrolytes- off ARB . Dehydration - likely due to Nausea vomiting, give IVF . Nausea with vomiting -improve . Hyperkalemia - resolve- off ARB/ in setting of ARF Anemia - obtain anemia panel, hemoccult stool somewhat worse from baseline      LOS: 1 day   Wenda Low 08/18/2013, 9:03 AM

## 2013-08-19 LAB — CBC
HCT: 32 % — ABNORMAL LOW (ref 39.0–52.0)
Hemoglobin: 10.3 g/dL — ABNORMAL LOW (ref 13.0–17.0)
MCH: 30.2 pg (ref 26.0–34.0)
MCHC: 32.2 g/dL (ref 30.0–36.0)
MCV: 93.8 fL (ref 78.0–100.0)
Platelets: 161 10*3/uL (ref 150–400)
RBC: 3.41 MIL/uL — ABNORMAL LOW (ref 4.22–5.81)
RDW: 13.8 % (ref 11.5–15.5)
WBC: 17.2 10*3/uL — ABNORMAL HIGH (ref 4.0–10.5)

## 2013-08-19 LAB — URINE CULTURE
Colony Count: NO GROWTH
Culture: NO GROWTH

## 2013-08-19 LAB — BASIC METABOLIC PANEL
BUN: 38 mg/dL — ABNORMAL HIGH (ref 6–23)
CO2: 20 mEq/L (ref 19–32)
Calcium: 9.3 mg/dL (ref 8.4–10.5)
Chloride: 103 mEq/L (ref 96–112)
Creatinine, Ser: 1.3 mg/dL (ref 0.50–1.35)
GFR calc Af Amer: 61 mL/min — ABNORMAL LOW (ref >90)
GFR calc non Af Amer: 52 mL/min — ABNORMAL LOW (ref >90)
GLUCOSE: 90 mg/dL (ref 70–99)
POTASSIUM: 4.3 meq/L (ref 3.7–5.3)
Sodium: 137 mEq/L (ref 137–147)

## 2013-08-19 LAB — GLUCOSE, CAPILLARY
GLUCOSE-CAPILLARY: 63 mg/dL — AB (ref 70–99)
GLUCOSE-CAPILLARY: 158 mg/dL — AB (ref 70–99)
Glucose-Capillary: 93 mg/dL (ref 70–99)
Glucose-Capillary: 134 mg/dL — ABNORMAL HIGH (ref 70–99)
Glucose-Capillary: 223 mg/dL — ABNORMAL HIGH (ref 70–99)

## 2013-08-19 MED ORDER — INSULIN DETEMIR 100 UNIT/ML ~~LOC~~ SOLN
12.0000 [IU] | Freq: Every day | SUBCUTANEOUS | Status: DC
  Administered 2013-08-19 – 2013-08-24 (×6): 12 [IU] via SUBCUTANEOUS
  Filled 2013-08-19 (×7): qty 0.12

## 2013-08-19 MED ORDER — ENOXAPARIN SODIUM 40 MG/0.4ML ~~LOC~~ SOLN
40.0000 mg | Freq: Every day | SUBCUTANEOUS | Status: DC
  Administered 2013-08-19 – 2013-08-24 (×6): 40 mg via SUBCUTANEOUS
  Filled 2013-08-19 (×7): qty 0.4

## 2013-08-19 NOTE — Progress Notes (Signed)
Subjective: Pt without c/o BS mild low this am Eating ok Blodd culture positive  Gram positive cocci.  Objective: Vital signs in last 24 hours: Temp:  [97.9 F (36.6 C)-98.9 F (37.2 C)] 98 F (36.7 C) (04/19 0555) Pulse Rate:  [87-96] 88 (04/19 0555) Resp:  [18] 18 (04/19 0555) BP: (100-111)/(48-53) 111/53 mmHg (04/19 0555) SpO2:  [98 %-99 %] 99 % (04/19 0555) Weight change:  Last BM Date: 08/16/13  Intake/Output from previous day: 04/18 0701 - 04/19 0700 In: 2636.3 [P.O.:1080; I.V.:1106.3; IV Piggyback:450] Out: -  Intake/Output this shift:    General appearance: alert Resp: clear to auscultation bilaterally Extremities: reddness on right lower leg mild better.  Lab Results:  Recent Labs  08/17/13 1935 08/18/13 0620  WBC 20.3* 19.3*  HGB 11.1* 9.9*  HCT 34.2* 30.5*  PLT 161 154   BMET  Recent Labs  08/17/13 1935 08/18/13 0620  NA 137 138  K 4.7 4.5  CL 102 103  CO2 21 21  GLUCOSE 233* 103*  BUN 40* 42*  CREATININE 1.43* 1.48*  CALCIUM 9.5 9.2    Studies/Results: Dg Tibia/fibula Right  08/18/2013   CLINICAL DATA:  Evaluate for osteomyelitis.  EXAM: RIGHT TIBIA AND FIBULA - 2 VIEW  COMPARISON:  None.  FINDINGS: There is no evidence of fracture or other focal bone lesions. Soft tissues are unremarkable. Joint space narrowing and degenerative spurring within the right knee. No radiographic changes of osteomyelitis.  IMPRESSION: No acute bony abnormality.   Electronically Signed   By: Rolm Baptise M.D.   On: 08/18/2013 00:43   US Venous Img Lower Unilateral Right  08/17/2013   CLINICAL DATA:  Patient complains of redness and warmth for 1 day. Streaking and redness throughout the leg. The patient denies pain.  EXAM: Right LOWER EXTREMITY VENOUS DOPPLER ULTRASOUND  TECHNIQUE: Gray-scale sonography with graded compression, as well as color Doppler and duplex ultrasound were performed to evaluate the lower extremity deep venous systems from the level of the  common femoral vein and including the common femoral, femoral, profunda femoral, popliteal and calf veins including the posterior tibial, peroneal and gastrocnemius veins when visible. The superficial great saphenous vein was also interrogated. Spectral Doppler was utilized to evaluate flow at rest and with distal augmentation maneuvers in the common femoral, femoral and popliteal veins.  COMPARISON:  None.  FINDINGS: Common Femoral Vein: No evidence of thrombus. Normal compressibility, respiratory phasicity and response to augmentation.  Saphenofemoral Junction: No evidence of thrombus. Normal compressibility and flow on color Doppler imaging.  Profunda Femoral Vein: No evidence of thrombus. Normal compressibility and flow on color Doppler imaging.  Femoral Vein: No evidence of thrombus. Normal compressibility, respiratory phasicity and response to augmentation.  Popliteal Vein: No evidence of thrombus. Normal compressibility, respiratory phasicity and response to augmentation.  Calf Veins: Limited evaluation.  Superficial Great Saphenous Vein: No evidence of thrombus. Normal compressibility and flow on color Doppler imaging.  Other Findings: Multiple varicosities are identified within the calf, none identified containing clots. Complex fluid collection is identified in the right popliteal fossa region measuring 6.8 x 1.7 x 2.9 centimeters.  IMPRESSION: 1. No evidence for occlusive deep venous thrombosis. 2. Calf varicosities. 3. Popliteal fossa fluid collection.   Electronically Signed   By: Shon Hale M.D.   On: 08/17/2013 15:25   Dg Foot 2 Views Right  08/18/2013   CLINICAL DATA:  Evaluate for osteomyelitis.  EXAM: RIGHT FOOT - 2 VIEW  COMPARISON:  None.  FINDINGS: Degenerative  changes throughout the right foot. Mild pes planus. Plantar calcaneal spur. No fracture, subluxation or dislocation. No radiographic changes of osteomyelitis.  IMPRESSION: No acute bony abnormality.   Electronically Signed   By: Rolm Baptise M.D.   On: 08/18/2013 00:44    Medications: I have reviewed the patient's current medications.  Assessment/Plan: Cellulitis - treat with Vanc/zosyn , xray of leg and foot- ok.; doppler negative for DVT. Blood Culture x1 bottle positive with Gram positive cocci- continue Zosyn/ vancomycin until sensitivity. Marland Kitchen DM2 (diabetes mellitus, type 2) - hold metfromin, decrease  levemir, SSI; stop glipizide BS in 90-100 . HTN (hypertension) - off ARB due to ARF  . Acute renal failure - likely due to dehydration,continue IVF, urine electrolytes- off ARB- follow renal function . Dehydration - likely due to Nausea vomiting, give IVF . Nausea with vomiting -improve  . Hyperkalemia - resolve- off ARB/ in setting of ARF  Anemia - cbc ck- iron panel. hemoccult negative   LOS: 2 days   Wenda Low 08/19/2013, 8:00 AM

## 2013-08-20 ENCOUNTER — Encounter (HOSPITAL_COMMUNITY): Payer: Self-pay | Admitting: General Practice

## 2013-08-20 LAB — BASIC METABOLIC PANEL
BUN: 33 mg/dL — ABNORMAL HIGH (ref 6–23)
CALCIUM: 9.2 mg/dL (ref 8.4–10.5)
CO2: 20 mEq/L (ref 19–32)
Chloride: 105 mEq/L (ref 96–112)
Creatinine, Ser: 1.24 mg/dL (ref 0.50–1.35)
GFR calc Af Amer: 64 mL/min — ABNORMAL LOW (ref >90)
GFR, EST NON AFRICAN AMERICAN: 56 mL/min — AB (ref >90)
Glucose, Bld: 130 mg/dL — ABNORMAL HIGH (ref 70–99)
Potassium: 4 mEq/L (ref 3.7–5.3)
Sodium: 138 mEq/L (ref 137–147)

## 2013-08-20 LAB — CBC
HCT: 30.2 % — ABNORMAL LOW (ref 39.0–52.0)
Hemoglobin: 9.8 g/dL — ABNORMAL LOW (ref 13.0–17.0)
MCH: 30.2 pg (ref 26.0–34.0)
MCHC: 32.5 g/dL (ref 30.0–36.0)
MCV: 93.2 fL (ref 78.0–100.0)
PLATELETS: 164 10*3/uL (ref 150–400)
RBC: 3.24 MIL/uL — ABNORMAL LOW (ref 4.22–5.81)
RDW: 13.6 % (ref 11.5–15.5)
WBC: 15.3 10*3/uL — AB (ref 4.0–10.5)

## 2013-08-20 LAB — GLUCOSE, CAPILLARY
GLUCOSE-CAPILLARY: 159 mg/dL — AB (ref 70–99)
Glucose-Capillary: 97 mg/dL (ref 70–99)
Glucose-Capillary: 122 mg/dL — ABNORMAL HIGH (ref 70–99)
Glucose-Capillary: 133 mg/dL — ABNORMAL HIGH (ref 70–99)

## 2013-08-20 MED ORDER — PNEUMOCOCCAL VAC POLYVALENT 25 MCG/0.5ML IJ INJ
0.5000 mL | INJECTION | INTRAMUSCULAR | Status: AC
  Administered 2013-08-25: 0.5 mL via INTRAMUSCULAR
  Filled 2013-08-20: qty 0.5

## 2013-08-20 NOTE — Progress Notes (Signed)
ANTIBIOTIC CONSULT NOTE - INITIAL  Pharmacy Consult for  Vancomycin / Zosyn Indication: Cellulitis  No Known Allergies  Patient Measurements: Height: 5\' 8"  (172.7 cm) Weight: 170 lb (77.111 kg) IBW/kg (Calculated) : 68.4   Vital Signs: Temp: 97.8 F (36.6 C) (04/20 0610) BP: 112/65 mmHg (04/20 0610) Pulse Rate: 86 (04/20 0610) Intake/Output from previous day: 04/19 0701 - 04/20 0700 In: 510 [P.O.:360; IV Piggyback:150] Out: 1000 [Urine:1000] Intake/Output from this shift: Total I/O In: -  Out: 250 [Urine:250]  Labs:  Recent Labs  08/17/13 1935 08/17/13 2221 08/18/13 0620 08/19/13 0712 08/20/13 0414  WBC 20.3*  --  19.3* 17.2* 15.3*  HGB 11.1*  --  9.9* 10.3* 9.8*  PLT 161  --  154 161 164  LABCREA  --  66.89  --   --   --   CREATININE 1.43*  --  1.48* 1.30 1.24   Estimated Creatinine Clearance: 50.6 ml/min (by C-G formula based on Cr of 1.24). No results found for this basename: VANCOTROUGH, VANCOPEAK, VANCORANDOM, [place], [place], GENTRANDOM, [place], TOBRAPEAK, TOBRARND, AMIKACINPEAK, AMIKACINTROU, AMIKACIN,  in the last 72 hours   Microbiology: Recent Results (from the past 720 hour(s))  CULTURE, BLOOD (ROUTINE X 2)     Status: None   Collection Time    08/17/13  2:10 PM      Result Value Ref Range Status   Specimen Description BLOOD LEFT AC   Final   Special Requests NONE BOTTLES DRAWN AEROBIC AND ANAEROBIC  5CC   Final   Culture  Setup Time     Final   Value: 08/17/2013 19:16     Performed at [facility]   Culture     Final   Value:        BLOOD CULTURE RECEIVED NO GROWTH TO DATE CULTURE WILL BE HELD FOR 5 DAYS BEFORE ISSUING A FINAL NEGATIVE REPORT     Performed at [facility]   Report Status PENDING   Incomplete  CULTURE, BLOOD (ROUTINE X 2)     Status: None   Collection Time    08/17/13  2:15 PM      Result Value Ref Range Status   Specimen Description BLOOD  LEFT FA   Final   Special Requests NONE BOTTLES DRAWN  AEROBIC AND ANAEROBIC  5CC   Final   Culture  Setup Time     Final   Value: 08/17/2013 19:16     Performed at [facility]   Culture     Final   Value: GROUP A STREP (S.PYOGENES) ISOLATED     Note: CRITICAL RESULT CALLED TO, READ BACK BY AND VERIFIED WITH: ADRIAN BULLOCK 08/19/13 @ 10:23AM BY RUSCOE A.     Note: Gram Stain Report Called to,Read Back By and Verified With: DONNA NIEMELA 08/18/13 @ 12:28PM BY RUSCOE A.     Performed at [facility]   Report Status PENDING   Incomplete  URINE CULTURE     Status: None   Collection Time    08/17/13 10:21 PM      Result Value Ref Range Status   Specimen Description URINE, RANDOM   Final   Special Requests NONE   Final   Culture  Setup Time     Final   Value: 08/18/2013 12:53     Performed at [facility]     Final   Value: NO GROWTH     Performed at [facility]  Culture     Final   Value: NO GROWTH     Performed at [facility]   Report Status 08/19/2013 FINAL   Final    Assessment: 73yom admitted for cellulitis RLE on D#4 vancomycin and zosyn. WBC trending down 20 >> 15.3, afebrile, renal fx improving Cr 1.6 >> 1.24  ( baseline 1 -10 months ago). Cultures see bellow, plan for continue current abx until sensitivity is back  Vanco 4/17>> Zosyn 4/17 >>  4/17 bld 1/2- Group A Strep (Pyogenes) 4/17 urine - neg  Goal of Therapy:  erradication of infection  Plan:  Continue Zosyn 3.375 GM IV Q8hr EI Continue vancomycin 1250 mg IV Q 24 hrs Check vancomycin trough tomorrow or Wednesday if continues.   Maryanna Shape, PharmD, BCPS  Clinical Pharmacist  Pager: [phone]   08/20/2013 1:22 PM

## 2013-08-20 NOTE — Progress Notes (Addendum)
Subjective: Patient doing well, no recurrent hypoglycemia, still with no warmth in the right lower extremity, low-grade temperature as well. White count down. One of 2 blood cultures show group a strep  Objective: Vital signs in last 24 hours: Temp:  [97.8 F (36.6 C)-99.2 F (37.3 C)] 97.8 F (36.6 C) (04/20 0610) Pulse Rate:  [86-92] 86 (04/20 0610) Resp:  [16-18] 17 (04/20 0610) BP: (112-130)/(56-65) 112/65 mmHg (04/20 0610) SpO2:  [96 %-100 %] 96 % (04/20 0610) Weight change:  Last BM Date: 08/16/13  Intake/Output from previous day: 04/19 0701 - 04/20 0700 In: 510 [P.O.:360; IV Piggyback:150] Out: 1000 [Urine:1000] Intake/Output this shift: Total I/O In: -  Out: 250 [Urine:250]  General appearance: alert and cooperative Resp: clear to auscultation bilaterally Cardio: regular rate and rhythm, S1, S2 normal, no murmur, click, rub or gallop Extremities: right lower extremity with some swelling, warmth, erythema, calf tenderness with palpation, 2+ pulse  Lab Results:  Results for orders placed during the hospital encounter of 08/17/13 (from the past 24 hour(s))  GLUCOSE, CAPILLARY     Status: Abnormal   Collection Time    08/19/13 10:59 AM      Result Value Ref Range   Glucose-Capillary 134 (*) 70 - 99 mg/dL  GLUCOSE, CAPILLARY     Status: Abnormal   Collection Time    08/19/13  4:34 PM      Result Value Ref Range   Glucose-Capillary 158 (*) 70 - 99 mg/dL   Comment 1 Notify RN     Comment 2 Documented in Chart    GLUCOSE, CAPILLARY     Status: Abnormal   Collection Time    08/19/13  9:16 PM      Result Value Ref Range   Glucose-Capillary 223 (*) 70 - 99 mg/dL   Comment 1 Notify RN     Comment 2 Documented in Chart    CBC     Status: Abnormal   Collection Time    08/20/13  4:14 AM      Result Value Ref Range   WBC 15.3 (*) 4.0 - 10.5 K/uL   RBC 3.24 (*) 4.22 - 5.81 MIL/uL   Hemoglobin 9.8 (*) 13.0 - 17.0 g/dL   HCT 30.2 (*) 39.0 - 52.0 %   MCV 93.2  78.0 -  100.0 fL   MCH 30.2  26.0 - 34.0 pg   MCHC 32.5  30.0 - 36.0 g/dL   RDW 13.6  11.5 - 15.5 %   Platelets 164  150 - 400 K/uL  BASIC METABOLIC PANEL     Status: Abnormal   Collection Time    08/20/13  4:14 AM      Result Value Ref Range   Sodium 138  137 - 147 mEq/L   Potassium 4.0  3.7 - 5.3 mEq/L   Chloride 105  96 - 112 mEq/L   CO2 20  19 - 32 mEq/L   Glucose, Bld 130 (*) 70 - 99 mg/dL   BUN 33 (*) 6 - 23 mg/dL   Creatinine, Ser 1.24  0.50 - 1.35 mg/dL   Calcium 9.2  8.4 - 10.5 mg/dL   GFR calc non Af Amer 56 (*) >90 mL/min   GFR calc Af Amer 64 (*) >90 mL/min  GLUCOSE, CAPILLARY     Status: None   Collection Time    08/20/13  7:05 AM      Result Value Ref Range   Glucose-Capillary 97  70 - 99 mg/dL  Studies/Results: No results found.  Medications:  Prior to Admission:  Prescriptions prior to admission  Medication Sig Dispense Refill  . aspirin 81 MG chewable tablet Chew 81 mg by mouth daily.      . ferrous sulfate 325 (65 FE) MG tablet Take 325 mg by mouth daily with breakfast.      . glipiZIDE (GLUCOTROL XL) 5 MG 24 hr tablet Take 5 mg by mouth daily.      . insulin detemir (LEVEMIR) 100 UNIT/ML injection Inject 16 Units into the skin at bedtime.       Marland Kitchen losartan (COZAAR) 100 MG tablet Take 100 mg by mouth daily.      . metFORMIN (GLUCOPHAGE) 1000 MG tablet Take 1,000 mg by mouth 2 (two) times daily with a meal.      . simvastatin (ZOCOR) 20 MG tablet Take 20 mg by mouth every evening.      . Tetrahydrozoline HCl (EYE DROPS OP) Place 1 drop into both eyes 2 (two) times daily as needed (dry eyes).       Scheduled: . aspirin  81 mg Oral Daily  . docusate sodium  100 mg Oral BID  . enoxaparin (LOVENOX) injection  40 mg Subcutaneous QHS  . insulin aspart  0-5 Units Subcutaneous QHS  . insulin aspart  0-9 Units Subcutaneous TID WC  . insulin detemir  12 Units Subcutaneous QHS  . piperacillin-tazobactam (ZOSYN)  IV  3.375 g Intravenous 3 times per day  . [START ON  08/21/2013] pneumococcal 23 valent vaccine  0.5 mL Intramuscular Tomorrow-1000  . simvastatin  20 mg Oral QPM  . sodium chloride  3 mL Intravenous Q12H  . tetrahydrozoline  1 drop Both Eyes BID  . vancomycin  1,250 mg Intravenous Q24H   Continuous: . sodium chloride 75 mL/hr at 08/18/13 1745   [id number], acetaminophen, HYDROcodone-acetaminophen, metoCLOPramide (REGLAN) injection, ondansetron (ZOFRAN) IV, ondansetron  Assessment/Plan: Cellulitis - treat with Vanc/zosyn , xray of leg and foot- ok.; doppler negative for DVT.  Blood Culture x1 bottle positive with Gram positive cocci-, group a strep continue Zosyn/ vancomycin until sensitivity. Marland Kitchen DM2 (diabetes mellitus, type 2) - hold metfromin, decrease levemir, SSI; stop glipizide BS in 90-100 . HTN (hypertension) - off ARB due to ARF  . Acute renal failure - likely due to dehydration,continue IVF, urine electrolytes- off ARB- follow renal function . Dehydration - likely due to Nausea vomiting, improve, KVO IV fluids . Nausea with vomiting -resolved . Hyperkalemia - resolve- off ARB/ in setting of ARF  Anemia - cbc ck- iron panel. hemoccult negative    LOS: 3 days   Jason Mcpherson 08/20/2013, 10:04 AM

## 2013-08-20 NOTE — Progress Notes (Signed)
Utilization review completed.  

## 2013-08-21 LAB — GLUCOSE, CAPILLARY
GLUCOSE-CAPILLARY: 80 mg/dL (ref 70–99)
GLUCOSE-CAPILLARY: 242 mg/dL — AB (ref 70–99)
Glucose-Capillary: 131 mg/dL — ABNORMAL HIGH (ref 70–99)
Glucose-Capillary: 223 mg/dL — ABNORMAL HIGH (ref 70–99)

## 2013-08-21 LAB — CULTURE, BLOOD (ROUTINE X 2)

## 2013-08-21 NOTE — Progress Notes (Signed)
Subjective: No new problems overnight, patient still was pain and tenderness in the leg.  Objective: Vital signs in last 24 hours: Temp:  [98 F (36.7 C)-99 F (37.2 C)] 98 F (36.7 C) (04/21 0539) Pulse Rate:  [78-82] 78 (04/21 0539) Resp:  [16] 16 (04/21 0539) BP: (112-123)/(52-53) 118/53 mmHg (04/21 0539) SpO2:  [96 %-100 %] 100 % (04/21 0539) Weight change:  Last BM Date: 08/16/13  Intake/Output from previous day: 04/20 0701 - 04/21 0700 In: [id number] [P.O.:720; I.V.:825] Out: 2150 [Urine:2150] Intake/Output this shift: Total I/O In: 240 [P.O.:240] Out: 100 [Urine:100]  General appearance: alert and cooperative Resp: clear to auscultation bilaterally Cardio: regular rate and rhythm, S1, S2 normal, no murmur, click, rub or gallop Extremities: right lower extremity remains with warmth, erythema and tenderness.  Lab Results:  Results for orders placed during the hospital encounter of 08/17/13 (from the past 24 hour(s))  GLUCOSE, CAPILLARY     Status: Abnormal   Collection Time    08/20/13  4:39 PM      Result Value Ref Range   Glucose-Capillary 122 (*) 70 - 99 mg/dL  GLUCOSE, CAPILLARY     Status: Abnormal   Collection Time    08/20/13  9:27 PM      Result Value Ref Range   Glucose-Capillary 133 (*) 70 - 99 mg/dL  GLUCOSE, CAPILLARY     Status: None   Collection Time    08/21/13  6:32 AM      Result Value Ref Range   Glucose-Capillary 80  70 - 99 mg/dL  GLUCOSE, CAPILLARY     Status: Abnormal   Collection Time    08/21/13 11:40 AM      Result Value Ref Range   Glucose-Capillary 131 (*) 70 - 99 mg/dL      Studies/Results: No results found.  Medications:  Prior to Admission:  Prescriptions prior to admission  Medication Sig Dispense Refill  . aspirin 81 MG chewable tablet Chew 81 mg by mouth daily.      . ferrous sulfate 325 (65 FE) MG tablet Take 325 mg by mouth daily with breakfast.      . glipiZIDE (GLUCOTROL XL) 5 MG 24 hr tablet Take 5 mg by mouth  daily.      . insulin detemir (LEVEMIR) 100 UNIT/ML injection Inject 16 Units into the skin at bedtime.       Marland Kitchen losartan (COZAAR) 100 MG tablet Take 100 mg by mouth daily.      . metFORMIN (GLUCOPHAGE) 1000 MG tablet Take 1,000 mg by mouth 2 (two) times daily with a meal.      . simvastatin (ZOCOR) 20 MG tablet Take 20 mg by mouth every evening.      . Tetrahydrozoline HCl (EYE DROPS OP) Place 1 drop into both eyes 2 (two) times daily as needed (dry eyes).       Scheduled: . aspirin  81 mg Oral Daily  . docusate sodium  100 mg Oral BID  . enoxaparin (LOVENOX) injection  40 mg Subcutaneous QHS  . insulin aspart  0-5 Units Subcutaneous QHS  . insulin aspart  0-9 Units Subcutaneous TID WC  . insulin detemir  12 Units Subcutaneous QHS  . piperacillin-tazobactam (ZOSYN)  IV  3.375 g Intravenous 3 times per day  . pneumococcal 23 valent vaccine  0.5 mL Intramuscular Tomorrow-1000  . simvastatin  20 mg Oral QPM  . sodium chloride  3 mL Intravenous Q12H  . tetrahydrozoline  1 drop Both Eyes  BID  . vancomycin  1,250 mg Intravenous Q24H   Continuous: . sodium chloride 20 mL/hr (08/21/13 1111)    Assessment/Plan: Cellulitis - treat with Vanc/zosyn , xray of leg and foot- ok.; doppler negative for DVT.  Blood Culture x1 bottle positive with , group a strep continue Zosyn/ vancomycin until sensitivity. Patient does appear to have a slow resolution of his cellulitis we'll check followup labs in a.m. . DM2 (diabetes mellitus, type 2) - hold metfromin, decrease levemir, SSI; stop glipizide BS in 90-100 . HTN (hypertension) - off ARB due to ARF  . Acute renal failure - likely due to dehydration . Dehydration - likely due to Nausea vomiting, improve, KVO IV fluids . Nausea with vomiting -resolved  . Hyperkalemia - resolve- off ARB/ in setting of ARF  Anemia - cbc ck- iron panel. hemoccult negative    LOS: 4 days   [name] 08/21/2013, 12:26 PM

## 2013-08-22 DIAGNOSIS — B950 Streptococcus, group A, as the cause of diseases classified elsewhere: Secondary | ICD-10-CM

## 2013-08-22 DIAGNOSIS — L97509 Non-pressure chronic ulcer of other part of unspecified foot with unspecified severity: Secondary | ICD-10-CM

## 2013-08-22 DIAGNOSIS — R7881 Bacteremia: Secondary | ICD-10-CM

## 2013-08-22 LAB — CBC
HEMATOCRIT: 29.3 % — AB (ref 39.0–52.0)
HEMOGLOBIN: 9.3 g/dL — AB (ref 13.0–17.0)
MCH: 29.9 pg (ref 26.0–34.0)
MCHC: 31.7 g/dL (ref 30.0–36.0)
MCV: 94.2 fL (ref 78.0–100.0)
Platelets: 176 10*3/uL (ref 150–400)
RBC: 3.11 MIL/uL — ABNORMAL LOW (ref 4.22–5.81)
RDW: 13.7 % (ref 11.5–15.5)
WBC: 12.9 10*3/uL — ABNORMAL HIGH (ref 4.0–10.5)

## 2013-08-22 LAB — BASIC METABOLIC PANEL
BUN: 23 mg/dL (ref 6–23)
CHLORIDE: 106 meq/L (ref 96–112)
CO2: 23 mEq/L (ref 19–32)
Calcium: 9.1 mg/dL (ref 8.4–10.5)
Creatinine, Ser: 1.17 mg/dL (ref 0.50–1.35)
GFR calc non Af Amer: 60 mL/min — ABNORMAL LOW (ref >90)
GFR, EST AFRICAN AMERICAN: 69 mL/min — AB (ref >90)
Glucose, Bld: 114 mg/dL — ABNORMAL HIGH (ref 70–99)
POTASSIUM: 4.2 meq/L (ref 3.7–5.3)
Sodium: 141 mEq/L (ref 137–147)

## 2013-08-22 LAB — GLUCOSE, CAPILLARY
GLUCOSE-CAPILLARY: 115 mg/dL — AB (ref 70–99)
GLUCOSE-CAPILLARY: 121 mg/dL — AB (ref 70–99)
GLUCOSE-CAPILLARY: 258 mg/dL — AB (ref 70–99)
Glucose-Capillary: 131 mg/dL — ABNORMAL HIGH (ref 70–99)

## 2013-08-22 MED ORDER — CEFTRIAXONE SODIUM 2 G IJ SOLR
2.0000 g | INTRAMUSCULAR | Status: DC
  Administered 2013-08-22 – 2013-08-25 (×4): 2 g via INTRAVENOUS
  Filled 2013-08-22 (×4): qty 2

## 2013-08-22 NOTE — Evaluation (Signed)
Physical Therapy Evaluation Patient Details Name: Jason Mcpherson MRN: [id number] DOB: [birth year] Today's Date: 08/22/2013   History of Present Illness  73 y.o. male presenting with cellulitis of RLE  Clinical Impression  Patient seen for diagnosis listed above resulting in functional limitations due to the deficits listed below (see PT Problem List). Pt demonstrates poor balance without RW and increased difficulty with transfers. Pt admits to having a fall recently at home but prior to complications of cellulitis, therefore outpatient PT services are warranted due to history of arthritis in L ankle causing additional gait deviations and unsafe mobility. Patient will benefit from skilled PT to increase their independence and safety with mobility to allow discharge to the venue listed below. Will continue to follow acutely focusing on increasing level of independence with mobility.      Follow Up Recommendations Supervision for mobility/OOB;Outpatient PT (Pt reports having a fall at home prior to cellulitis)    Equipment Recommendations  Rolling walker with 5" wheels;3in1 (PT)    Recommendations for Other Services OT consult     Precautions / Restrictions Precautions Precautions: None Restrictions Weight Bearing Restrictions: No      Mobility  Bed Mobility                  Transfers Overall transfer level: Needs assistance Equipment used: Rolling walker (2 wheeled) Transfers: Sit to/from Stand Sit to Stand: Min guard         General transfer comment: min assist for sit<>stand from recliner and BSC. Pt performed several times during therapy focusing on hand placement and technique. Pt rocks several times before standing. Requires extra time. Difficulty positioning RLE due to swelling limiting ROM  Ambulation/Gait Ambulation/Gait assistance: Min guard Ambulation Distance (Feet): 25 Feet Assistive device: Rolling walker (2 wheeled) Gait Pattern/deviations:  Step-through pattern;Decreased step length - left;Decreased stance time - right;Decreased stride length;Decreased dorsiflexion - left;Steppage;Antalgic     General Gait Details: Antalgic gait pattern with pronounced steppage gait present due to left ankle arthritis limiting dorsiflexion. PT focused on increasing stride length during ambulation. Cues for sequencing and RW placement with turning.  Stairs            Wheelchair Mobility    Modified Rankin (Stroke Patients Only)       Balance Overall balance assessment: Needs assistance Sitting-balance support: No upper extremity supported;Feet supported Sitting balance-Leahy Scale: Good Sitting balance - Comments: Sits recliner able to lean forward without UE support   Standing balance support: Bilateral upper extremity supported Standing balance-Leahy Scale: Poor Standing balance comment: Pt unable to stand safely without bil UE support                             Pertinent Vitals/Pain Pt reports his pain is under control currently. Patient repositioned in chair for comfort.     Home Living Family/patient expects to be discharged to:: Private residence Living Arrangements: Spouse/significant other Available Help at Discharge: Available 24 hours/day Type of Home: House Home Access: Level entry     Home Layout: One [place] - single point      Prior Function Level of Independence: Independent with assistive device(s)         Comments: Cane for mobility (Works full time)     Journalist, newspaper   Dominant Hand: Right    Extremity/Trunk Assessment   Upper Extremity Assessment: Overall WFL for tasks assessed  Lower Extremity Assessment: RLE deficits/detail;LLE deficits/detail RLE Deficits / Details: significant edema in RLE with erythema and calor. decreased ankle ROM and decreased strength of ankle and knee LLE Deficits / Details: Hx of arthritis in L ankle. Very  limited acitve and passive ROM     Communication   Communication: No difficulties  Cognition Arousal/Alertness: Awake/alert Behavior During Therapy: WFL for tasks assessed/performed Overall Cognitive Status: Within Functional Limits for tasks assessed                      General Comments General comments (skin integrity, edema, etc.): Edema, erythema, and calor or RLE. Pt educated on therapeutic exercises and safe mobility techniques. Pt admitts to having fall at home recently.    Exercises General Exercises - Lower Extremity Ankle Circles/Pumps: AROM;Both;10 reps;Seated Long Arc Quad: AROM;Both;10 reps;Seated      Assessment/Plan    PT Assessment Patient needs continued PT services  PT Diagnosis Difficulty walking;Abnormality of gait;Acute pain   PT Problem List Decreased strength;Decreased range of motion;Decreased activity tolerance;Decreased balance;Decreased mobility;Decreased knowledge of use of DME;Pain  PT Treatment Interventions DME instruction;Gait training;Stair training;Functional mobility training;Therapeutic activities;Therapeutic exercise;Balance training;Neuromuscular re-education;Patient/family education;Modalities   PT Goals (Current goals can be found in the Care Plan section) Acute Rehab PT Goals Patient Stated Goal: Get back to work PT Goal Formulation: With patient Time For Goal Achievement: 08/29/13 Potential to Achieve Goals: Good    Frequency Min 3X/week   Barriers to discharge        Co-evaluation               End of Session Equipment Utilized During Treatment: Gait belt Activity Tolerance: Patient tolerated treatment well Patient left: in chair;with call bell/phone within reach Nurse Communication: Mobility status         Time: [id number] PT Time Calculation (min): 32 min   Charges:   PT Evaluation $Initial PT Evaluation Tier I: 1 Procedure PT Treatments $Gait Training: 8-22 mins   PT G CodesCamille Bal  [place], [place] 08/22/2013, 4:27 PM

## 2013-08-22 NOTE — Progress Notes (Signed)
Subjective: No new problems, patient continues with slow improvement. Followup labs continue to look well  Objective: Vital signs in last 24 hours: Temp:  [98 F (36.7 C)-99.3 F (37.4 C)] 98.1 F (36.7 C) (04/22 1237) Pulse Rate:  [80-99] 86 (04/22 1237) Resp:  [16-18] 16 (04/22 1237) BP: (111-140)/(47-66) 138/60 mmHg (04/22 1237) SpO2:  [95 %-98 %] 98 % (04/22 1237) Weight change:  Last BM Date: 08/21/13  Intake/Output from previous day: 04/21 0701 - 04/22 0700 In: 1190 [P.O.:840; IV Piggyback:350] Out: 1900 [Urine:1900] Intake/Output this shift: Total I/O In: 450 [P.O.:450] Out: 250 [Urine:250]  General appearance: alert and cooperative Resp: clear to auscultation bilaterally Cardio: regular rate and rhythm, S1, S2 normal, no murmur, click, rub or gallop Extremities: decreased swelling, warmth, erythema right lower extremity, there is still tenderness., healing wound at the tip of second toe, no drainage no odor no tenderness  Lab Results:  Results for orders placed during the hospital encounter of 08/17/13 (from the past 24 hour(s))  GLUCOSE, CAPILLARY     Status: Abnormal   Collection Time    08/21/13  4:44 PM      Result Value Ref Range   Glucose-Capillary 223 (*) 70 - 99 mg/dL  GLUCOSE, CAPILLARY     Status: Abnormal   Collection Time    08/21/13  9:25 PM      Result Value Ref Range   Glucose-Capillary 242 (*) 70 - 99 mg/dL  BASIC METABOLIC PANEL     Status: Abnormal   Collection Time    08/22/13  4:50 AM      Result Value Ref Range   Sodium 141  137 - 147 mEq/L   Potassium 4.2  3.7 - 5.3 mEq/L   Chloride 106  96 - 112 mEq/L   CO2 23  19 - 32 mEq/L   Glucose, Bld 114 (*) 70 - 99 mg/dL   BUN 23  6 - 23 mg/dL   Creatinine, Ser 1.17  0.50 - 1.35 mg/dL   Calcium 9.1  8.4 - 10.5 mg/dL   GFR calc non Af Amer 60 (*) >90 mL/min   GFR calc Af Amer 69 (*) >90 mL/min  CBC     Status: Abnormal   Collection Time    08/22/13  4:50 AM      Result Value Ref Range   WBC 12.9 (*) 4.0 - 10.5 K/uL   RBC 3.11 (*) 4.22 - 5.81 MIL/uL   Hemoglobin 9.3 (*) 13.0 - 17.0 g/dL   HCT 29.3 (*) 39.0 - 52.0 %   MCV 94.2  78.0 - 100.0 fL   MCH 29.9  26.0 - 34.0 pg   MCHC 31.7  30.0 - 36.0 g/dL   RDW 13.7  11.5 - 15.5 %   Platelets 176  150 - 400 K/uL  GLUCOSE, CAPILLARY     Status: Abnormal   Collection Time    08/22/13  6:30 AM      Result Value Ref Range   Glucose-Capillary 115 (*) 70 - 99 mg/dL  GLUCOSE, CAPILLARY     Status: Abnormal   Collection Time    08/22/13 11:38 AM      Result Value Ref Range   Glucose-Capillary 131 (*) 70 - 99 mg/dL   Comment 1 Documented in Chart     Comment 2 Notify RN        Studies/Results: No results found.  Medications:  Prior to Admission:  Prescriptions prior to admission  Medication Sig Dispense Refill  .  aspirin 81 MG chewable tablet Chew 81 mg by mouth daily.      . ferrous sulfate 325 (65 FE) MG tablet Take 325 mg by mouth daily with breakfast.      . glipiZIDE (GLUCOTROL XL) 5 MG 24 hr tablet Take 5 mg by mouth daily.      . insulin detemir (LEVEMIR) 100 UNIT/ML injection Inject 16 Units into the skin at bedtime.       Marland Kitchen losartan (COZAAR) 100 MG tablet Take 100 mg by mouth daily.      . metFORMIN (GLUCOPHAGE) 1000 MG tablet Take 1,000 mg by mouth 2 (two) times daily with a meal.      . simvastatin (ZOCOR) 20 MG tablet Take 20 mg by mouth every evening.      . Tetrahydrozoline HCl (EYE DROPS OP) Place 1 drop into both eyes 2 (two) times daily as needed (dry eyes).       Scheduled: . aspirin  81 mg Oral Daily  . docusate sodium  100 mg Oral BID  . enoxaparin (LOVENOX) injection  40 mg Subcutaneous QHS  . insulin aspart  0-5 Units Subcutaneous QHS  . insulin aspart  0-9 Units Subcutaneous TID WC  . insulin detemir  12 Units Subcutaneous QHS  . piperacillin-tazobactam (ZOSYN)  IV  3.375 g Intravenous 3 times per day  . pneumococcal 23 valent vaccine  0.5 mL Intramuscular Tomorrow-1000  . simvastatin  20 mg  Oral QPM  . sodium chloride  3 mL Intravenous Q12H  . tetrahydrozoline  1 drop Both Eyes BID  . vancomycin  1,250 mg Intravenous Q24H   Continuous: . sodium chloride 20 mL/hr (08/21/13 1111)   [id number], acetaminophen, HYDROcodone-acetaminophen, metoCLOPramide (REGLAN) injection, ondansetron (ZOFRAN) IV, ondansetron  Assessment/Plan: Cellulitis - treated  with Vanc/zosyn , xray of leg and foot- ok.; doppler negative for DVT.  Blood Culture x1 bottle positive with , group a strep . Patient has been on Zosyn and vancomycin since admission. He continues to improve slowly. According to the lab no sensitivities will be drawn on a group A strep. Should be sensitive to penicillin, clindamycin. Due to the slow resolution, one out of 2 positive blood cultures I will discuss with ID antibiotic duration/selection . DM2 (diabetes mellitus, type 2) - hold metfromin, decrease levemir, SSI; stop glipizide BS in 90-100 . HTN (hypertension) - off ARB due to ARF  . Acute renal failure - likely due to dehydration  . Dehydration - likely due to Nausea vomiting, improve, KVO IV fluids . Nausea with vomiting -resolved  . Hyperkalemia - resolve- off ARB/ in setting of ARF  Anemia - cbc ck- iron panel. hemoccult negative    LOS: 5 days   Jason Mcpherson 08/22/2013, 12:48 PM

## 2013-08-22 NOTE — Consult Note (Signed)
[facility] for Infectious Disease    Date of Admission:  08/17/2013  Date of Consult:  08/22/2013  Reason for Consult: Group A streptococcal cellulitis and bacteremia Referring Physician: Dr. Delfina Redwood   HPI: Jason Mcpherson is an 73 y.o. male with diabetes mellitus hypertension , prior episode of cellulitis who presented to the emergency department on April 17 with acute urinary edema and swelling in his right leg that developed in the morning. Also developed fevers and felt poorly. He apparently also had ulceration in his right toe. He said that came to the [facility] partly found in acute renal failure the creatinine 1.6. He had obvious cellulitis and was admitted with blood cultures having been drawn he was placed on vancomycin and Zosyn   Past Medical History  Diagnosis Date  . Diabetes mellitus without complication   . Hyperlipemia   . Cellulitis 08/2013    RT LEG  . Dehydration   . Hyperkalemia 08/2013  . Chronic kidney disease 08/2013    ACUTE RENAL   . Anemia   . Hypertension   . Arthritis     KNEES & BACK    Past Surgical History  Procedure Laterality Date  . Back surgery      10 years ago  . Anterior cervical decomp/discectomy fusion N/A 10/17/2012    Procedure: ANTERIOR CERVICAL DECOMPRESSION/DISCECTOMY FUSION 2 LEVELS;  Surgeon: Charlie Pitter, MD;  Location: [place];  Service: Neurosurgery;  Laterality: N/A;  Cervical three-four, four-five anterior cervical decompression fusion with allograft plating  . Cervical disc surgery    ergies:   No Known Allergies   Medications: I have reviewed patients current medications as documented in Epic Anti-infectives   Start     Dose/Rate Route Frequency Ordered Stop   08/22/13 1400  cefTRIAXone (ROCEPHIN) 2 g in dextrose 5 % 50 mL IVPB     2 g 100 mL/hr over 30 Minutes Intravenous Every 24 hours 08/22/13 1321     08/18/13 1800  vancomycin (VANCOCIN) 1,250 mg in sodium chloride 0.9 % 250 mL IVPB  Status:  Discontinued       1,250 mg 166.7 mL/hr over 90 Minutes Intravenous Every 24 hours 08/17/13 1911 08/22/13 1321   08/17/13 2200  piperacillin-tazobactam (ZOSYN) IVPB 3.375 g  Status:  Discontinued     3.375 g 12.5 mL/hr over 240 Minutes Intravenous 3 times per day 08/17/13 1911 08/22/13 1321   08/17/13 1915  vancomycin (VANCOCIN) 500 mg in sodium chloride 0.9 % 100 mL IVPB     500 mg 100 mL/hr over 60 Minutes Intravenous NOW 08/17/13 1910 08/17/13 2129   08/17/13 1400  vancomycin (VANCOCIN) IVPB 1000 mg/200 mL premix     1,000 mg 200 mL/hr over 60 Minutes Intravenous  Once 08/17/13 1355 08/17/13 1554   08/17/13 1400  piperacillin-tazobactam (ZOSYN) IVPB 3.375 g     3.375 g 12.5 mL/hr over 240 Minutes Intravenous  Once 08/17/13 1355 08/17/13 1816      Social History:  reports that he quit smoking about 15 years ago. He has never used smokeless tobacco. He reports that he does not drink alcohol or use illicit drugs.  Family History  Problem Relation Age of Onset  . Cancer - Colon Mother   . Cancer - Prostate Father     As in HPI and primary teams notes otherwise 12 point review of systems is negative  Blood pressure 138/60, pulse 86, temperature 98.1 F (36.7 C), temperature source Oral, resp. rate 16,  height '5\' 8"'  (1.727 m), weight 170 lb (77.111 kg), SpO2 98.00%. General: Alert and awake, oriented x3, not in any acute distress. HEENT: anicteric sclera, pupils reactive to light and accommodation, EOMI, oropharynx clear and without exudate CVS regular rate, normal r,  no murmur rubs or gallops Chest: clear to auscultation bilaterally, no wheezing, rales or rhonchi Abdomen: soft nontender, nondistended, normal bowel sounds, Extremities: no  clubbing or edema noted bilaterally Skin: intense erythema on RLE, see pictures:        Neuro: nonfocal, strength and sensation intact   Results for orders placed during the hospital encounter of 08/17/13 (from the past 48 hour(s))  GLUCOSE, CAPILLARY      Status: Abnormal   Collection Time    08/20/13  9:27 PM      Result Value Ref Range   Glucose-Capillary 133 (*) 70 - 99 mg/dL  GLUCOSE, CAPILLARY     Status: None   Collection Time    08/21/13  6:32 AM      Result Value Ref Range   Glucose-Capillary 80  70 - 99 mg/dL  GLUCOSE, CAPILLARY     Status: Abnormal   Collection Time    08/21/13 11:40 AM      Result Value Ref Range   Glucose-Capillary 131 (*) 70 - 99 mg/dL  GLUCOSE, CAPILLARY     Status: Abnormal   Collection Time    08/21/13  4:44 PM      Result Value Ref Range   Glucose-Capillary 223 (*) 70 - 99 mg/dL  GLUCOSE, CAPILLARY     Status: Abnormal   Collection Time    08/21/13  9:25 PM      Result Value Ref Range   Glucose-Capillary 242 (*) 70 - 99 mg/dL  BASIC METABOLIC PANEL     Status: Abnormal   Collection Time    08/22/13  4:50 AM      Result Value Ref Range   Sodium 141  137 - 147 mEq/L   Potassium 4.2  3.7 - 5.3 mEq/L   Chloride 106  96 - 112 mEq/L   CO2 23  19 - 32 mEq/L   Glucose, Bld 114 (*) 70 - 99 mg/dL   BUN 23  6 - 23 mg/dL   Creatinine, Ser 1.17  0.50 - 1.35 mg/dL   Calcium 9.1  8.4 - 10.5 mg/dL   GFR calc non Af Amer 60 (*) >90 mL/min   GFR calc Af Amer 69 (*) >90 mL/min   Comment: (NOTE)     The eGFR has been calculated using the CKD EPI equation.     This calculation has not been validated in all clinical situations.     eGFR's persistently <90 mL/min signify possible Chronic Kidney     Disease.  CBC     Status: Abnormal   Collection Time    08/22/13  4:50 AM      Result Value Ref Range   WBC 12.9 (*) 4.0 - 10.5 K/uL   RBC 3.11 (*) 4.22 - 5.81 MIL/uL   Hemoglobin 9.3 (*) 13.0 - 17.0 g/dL   HCT 29.3 (*) 39.0 - 52.0 %   MCV 94.2  78.0 - 100.0 fL   MCH 29.9  26.0 - 34.0 pg   MCHC 31.7  30.0 - 36.0 g/dL   RDW 13.7  11.5 - 15.5 %   Platelets 176  150 - 400 K/uL  GLUCOSE, CAPILLARY     Status: Abnormal   Collection Time  08/22/13  6:30 AM      Result Value Ref Range   Glucose-Capillary  115 (*) 70 - 99 mg/dL  GLUCOSE, CAPILLARY     Status: Abnormal   Collection Time    08/22/13 11:38 AM      Result Value Ref Range   Glucose-Capillary 131 (*) 70 - 99 mg/dL   Comment 1 Documented in Chart     Comment 2 Notify RN    GLUCOSE, CAPILLARY     Status: Abnormal   Collection Time    08/22/13  4:46 PM      Result Value Ref Range   Glucose-Capillary 121 (*) 70 - 99 mg/dL      Component Value Date/Time   SDES URINE, RANDOM 08/17/2013 2221   SPECREQUEST NONE 08/17/2013 2221   CULT  Value: NO GROWTH Performed at [facility] 08/17/2013 2221   REPTSTATUS 08/19/2013 FINAL 08/17/2013 2221   No results found.   Recent Results (from the past 720 hour(s))  CULTURE, BLOOD (ROUTINE X 2)     Status: None   Collection Time    08/17/13  2:10 PM      Result Value Ref Range Status   Specimen Description BLOOD LEFT AC   Final   Special Requests NONE BOTTLES DRAWN AEROBIC AND ANAEROBIC  5CC   Final   Culture  Setup Time     Final   Value: 08/17/2013 19:16     Performed at [facility]   Culture     Final   Value:        BLOOD CULTURE RECEIVED NO GROWTH TO DATE CULTURE WILL BE HELD FOR 5 DAYS BEFORE ISSUING A FINAL NEGATIVE REPORT     Performed at [facility]   Report Status PENDING   Incomplete  CULTURE, BLOOD (ROUTINE X 2)     Status: None   Collection Time    08/17/13  2:15 PM      Result Value Ref Range Status   Specimen Description BLOOD  LEFT FA   Final   Special Requests NONE BOTTLES DRAWN AEROBIC AND ANAEROBIC  5CC   Final   Culture  Setup Time     Final   Value: 08/17/2013 19:16     Performed at [facility]   Culture     Final   Value: GROUP A STREP (S.PYOGENES) ISOLATED     Note: CRITICAL RESULT CALLED TO, READ BACK BY AND VERIFIED WITH: ADRIAN BULLOCK 08/19/13 @ 10:23AM BY RUSCOE A. FAXED TO [place] [facility] [id number] BY LYONK     Note: Gram Stain Report Called to,Read Back By and Verified With: DONNA NIEMELA 08/18/13 @ 12:28PM BY  RUSCOE A.     Performed at [facility]   Report Status 08/21/2013 FINAL   Final  URINE CULTURE     Status: None   Collection Time    08/17/13 10:21 PM      Result Value Ref Range Status   Specimen Description URINE, RANDOM   Final   Special Requests NONE   Final   Culture  Setup Time     Final   Value: 08/18/2013 12:53     Performed at [facility]     Final   Value: NO GROWTH     Performed at [facility]   Culture     Final   Value: NO GROWTH     Performed at [facility]  Report Status 08/19/2013 FINAL   Final     Impression/Recommendation  Active Problems:   Cellulitis   DM2 (diabetes mellitus, type 2)   HTN (hypertension)   Acute renal failure   Dehydration   Nausea with vomiting   Hyperkalemia   Jason Mcpherson is a 73 y.o. male with  diabetes mellitus chronic kidney disease prior cellulitis admitted with acute onset of fevers large MV edema erythema cellulitis found to be bacteremic with group A strep and with severe cellulitis.  #1 group A strep cellulitis with bacteremia:  --Reculture blood and prove that we have sterilized  --Narrow to ceftriaxone 2 g daily  --Will reexamine his foot and more closely look at his ulceration and is to ensure were not missing some chronic infection or portal that might be there including osteomyelitis.  --I. would be inclined to give him 2 weeks of parenteral therapy which can be arranged at home.  --I WOULD NOT PLACE PICC LINE THOUGH UNTIL WE HAVE PROVEN NGTD ON REPEAT BLOOD CULTURES THRU AT LEAST 48 HOURS  #2 Ulceration in toe: will more closely examine in am. Will consider MRI, will ask sedimentation rate and C-reactive protein  #3 screening check her for HIV and hepatitis viruses.   08/22/2013, 4:52 PM   Thank you so much for this interesting consult  [place] [facility] [phone] (pager) [phone] (office) 08/22/2013,  4:52 PM  [place] 08/22/2013, 4:52 PM

## 2013-08-23 ENCOUNTER — Inpatient Hospital Stay (HOSPITAL_COMMUNITY): Payer: BC Managed Care – PPO

## 2013-08-23 DIAGNOSIS — L03119 Cellulitis of unspecified part of limb: Principal | ICD-10-CM

## 2013-08-23 DIAGNOSIS — L02419 Cutaneous abscess of limb, unspecified: Principal | ICD-10-CM

## 2013-08-23 LAB — CULTURE, BLOOD (ROUTINE X 2): Culture: NO GROWTH

## 2013-08-23 LAB — GLUCOSE, CAPILLARY
GLUCOSE-CAPILLARY: 111 mg/dL — AB (ref 70–99)
GLUCOSE-CAPILLARY: 168 mg/dL — AB (ref 70–99)
Glucose-Capillary: 203 mg/dL — ABNORMAL HIGH (ref 70–99)
Glucose-Capillary: 119 mg/dL — ABNORMAL HIGH (ref 70–99)

## 2013-08-23 LAB — HEPATITIS PANEL, ACUTE
HCV Ab: NEGATIVE
Hep A IgM: NONREACTIVE
Hep B C IgM: NONREACTIVE
Hepatitis B Surface Ag: NEGATIVE

## 2013-08-23 LAB — C-REACTIVE PROTEIN: CRP: 16.3 mg/dL — ABNORMAL HIGH (ref <0.60)

## 2013-08-23 LAB — SEDIMENTATION RATE: Sed Rate: 130 mm/hr — ABNORMAL HIGH (ref 0–16)

## 2013-08-23 MED ORDER — GADOBENATE DIMEGLUMINE 529 MG/ML IV SOLN
15.0000 mL | Freq: Once | INTRAVENOUS | Status: AC
  Administered 2013-08-23: 15 mL via INTRAVENOUS

## 2013-08-23 NOTE — Progress Notes (Signed)
Jason Mcpherson for Infectious Disease  Day #7 antibiotics Day #2 rocephin  Subjective: Doesn't want to go home with IV even if it is Rocephin Syringe injection once daily   Antibiotics:  Anti-infectives   Start     Dose/Rate Route Frequency Ordered Stop   08/22/13 1400  cefTRIAXone (ROCEPHIN) 2 g in dextrose 5 % 50 mL IVPB     2 g 100 mL/hr over 30 Minutes Intravenous Every 24 hours 08/22/13 1321     08/18/13 1800  vancomycin (VANCOCIN) 1,250 mg in sodium chloride 0.9 % 250 mL IVPB  Status:  Discontinued     1,250 mg 166.7 mL/hr over 90 Minutes Intravenous Every 24 hours 08/17/13 1911 08/22/13 1321   08/17/13 2200  piperacillin-tazobactam (ZOSYN) IVPB 3.375 g  Status:  Discontinued     3.375 g 12.5 mL/hr over 240 Minutes Intravenous 3 times per day 08/17/13 1911 08/22/13 1321   08/17/13 1915  vancomycin (VANCOCIN) 500 mg in sodium chloride 0.9 % 100 mL IVPB     500 mg 100 mL/hr over 60 Minutes Intravenous NOW 08/17/13 1910 08/17/13 2129   08/17/13 1400  vancomycin (VANCOCIN) IVPB 1000 mg/200 mL premix     1,000 mg 200 mL/hr over 60 Minutes Intravenous  Once 08/17/13 1355 08/17/13 1554   08/17/13 1400  piperacillin-tazobactam (ZOSYN) IVPB 3.375 g     3.375 g 12.5 mL/hr over 240 Minutes Intravenous  Once 08/17/13 1355 08/17/13 1816      Medications: Scheduled Meds: . aspirin  81 mg Oral Daily  . cefTRIAXone (ROCEPHIN)  IV  2 g Intravenous Q24H  . docusate sodium  100 mg Oral BID  . enoxaparin (LOVENOX) injection  40 mg Subcutaneous QHS  . insulin aspart  0-5 Units Subcutaneous QHS  . insulin aspart  0-9 Units Subcutaneous TID WC  . insulin detemir  12 Units Subcutaneous QHS  . pneumococcal 23 valent vaccine  0.5 mL Intramuscular Tomorrow-1000  . simvastatin  20 mg Oral QPM  . sodium chloride  3 mL Intravenous Q12H  . tetrahydrozoline  1 drop Both Eyes BID   Continuous Infusions: . sodium chloride 20 mL/hr (08/21/13 1111)   PRN Meds:.acetaminophen, acetaminophen,  HYDROcodone-acetaminophen, metoCLOPramide (REGLAN) injection, ondansetron (ZOFRAN) IV, ondansetron    Objective: Weight change:   Intake/Output Summary (Last 24 hours) at 08/23/13 1528 Last data filed at 08/23/13 1300  Gross per 24 hour  Intake   1240 ml  Output   3900 ml  Net  -2660 ml   Blood pressure 123/60, pulse 81, temperature 98.8 F (37.1 C), temperature source Oral, resp. rate 18, height '5\' 8"'  (1.727 m), weight 170 lb (77.111 kg), SpO2 98.00%. Temp:  [98.3 F (36.8 C)-99.5 F (37.5 C)] 98.8 F (37.1 C) (04/23 1313) Pulse Rate:  [81-92] 81 (04/23 1313) Resp:  [18] 18 (04/23 1313) BP: (123-130)/(50-60) 123/60 mmHg (04/23 1313) SpO2:  [97 %-98 %] 98 % (04/23 1313)  Physical Exam:  General: Alert and awake, oriented x3, not in any acute distress.  HEENT: anicteric sclera, pupils reactive to light and accommodation, EOMI, oropharynx clear and without exudate  CVS regular rate, normal r, no murmur rubs or gallops  Chest: clear to auscultation bilaterally, no wheezing, rales or rhonchi  Abdomen: soft nontender, nondistended, normal bowel sounds,  Extremities: no clubbing or edema noted bilaterally  Skin: intense erythema on RLE, see pictures serial ones below:  Pix 08/22/13:      TODAY 08/23/13:      2nd toe with  area of ulceration :       CBC:  Recent Labs Lab 08/17/13 1935 08/18/13 0620 08/19/13 0712 08/20/13 0414 08/22/13 0450  HGB 11.1* 9.9* 10.3* 9.8* 9.3*  HCT 34.2* 30.5* 32.0* 30.2* 29.3*  PLT 161 154 161 164 176     BMET  Recent Labs  08/22/13 0450  NA 141  K 4.2  CL 106  CO2 23  GLUCOSE 114*  BUN 23  CREATININE 1.17  CALCIUM 9.1     Liver Panel  No results found for this basename: PROT, ALBUMIN, AST, ALT, ALKPHOS, BILITOT, BILIDIR, IBILI,  in the last 72 hours     Sedimentation Rate  Recent Labs  08/23/13 0447  ESRSEDRATE 130*   C-Reactive Protein  Recent Labs  08/23/13 0447  CRP 16.3*    Micro  Results: Recent Results (from the past 240 hour(s))  CULTURE, BLOOD (ROUTINE X 2)     Status: None   Collection Time    08/17/13  2:10 PM      Result Value Ref Range Status   Specimen Description BLOOD LEFT AC   Final   Special Requests NONE BOTTLES DRAWN AEROBIC AND ANAEROBIC  5CC   Final   Culture  Setup Time     Final   Value: 08/17/2013 19:16     Performed at [facility]   Culture     Final   Value: NO GROWTH 5 DAYS     Performed at [facility]   Report Status 08/23/2013 FINAL   Final  CULTURE, BLOOD (ROUTINE X 2)     Status: None   Collection Time    08/17/13  2:15 PM      Result Value Ref Range Status   Specimen Description BLOOD  LEFT FA   Final   Special Requests NONE BOTTLES DRAWN AEROBIC AND ANAEROBIC  5CC   Final   Culture  Setup Time     Final   Value: 08/17/2013 19:16     Performed at [facility]   Culture     Final   Value: GROUP A STREP (S.PYOGENES) ISOLATED     Note: CRITICAL RESULT CALLED TO, READ BACK BY AND VERIFIED WITH: Jason Mcpherson 08/19/13 @ 10:23AM BY RUSCOE A. FAXED TO [facility] [id number] BY LYONK     Note: Gram Stain Report Called to,Read Back By and Verified With: DONNA NIEMELA 08/18/13 @ 12:28PM BY RUSCOE A.     Performed at [facility]   Report Status 08/21/2013 FINAL   Final  URINE CULTURE     Status: None   Collection Time    08/17/13 10:21 PM      Result Value Ref Range Status   Specimen Description URINE, RANDOM   Final   Special Requests NONE   Final   Culture  Setup Time     Final   Value: 08/18/2013 12:53     Performed at [facility]     Final   Value: NO GROWTH     Performed at [facility]   Culture     Final   Value: NO GROWTH     Performed at [facility]   Report Status 08/19/2013 FINAL   Final  CULTURE, BLOOD (ROUTINE X 2)     Status: None   Collection Time    08/22/13  3:15 PM      Result Value Ref Range Status   Specimen  Description  BLOOD RIGHT ARM   Final   Special Requests     Final   Value: BOTTLES DRAWN AEROBIC AND ANAEROBIC 10CC AER,8CC ANA   Culture  Setup Time     Final   Value: 08/22/2013 19:31     Performed at [facility]   Culture     Final   Value:        BLOOD CULTURE RECEIVED NO GROWTH TO DATE CULTURE WILL BE HELD FOR 5 DAYS BEFORE ISSUING A FINAL NEGATIVE REPORT     Performed at [facility]   Report Status PENDING   Incomplete  CULTURE, BLOOD (ROUTINE X 2)     Status: None   Collection Time    08/22/13  3:25 PM      Result Value Ref Range Status   Specimen Description BLOOD RIGHT HAND   Final   Special Requests BOTTLES DRAWN AEROBIC ONLY 10CC   Final   Culture  Setup Time     Final   Value: 08/22/2013 19:31     Performed at [facility]   Culture     Final   Value:        BLOOD CULTURE RECEIVED NO GROWTH TO DATE CULTURE WILL BE HELD FOR 5 DAYS BEFORE ISSUING A FINAL NEGATIVE REPORT     Performed at [facility]   Report Status PENDING   Incomplete    Studies/Results: No results found.    Assessment/Plan:  Active Problems:   Cellulitis   DM2 (diabetes mellitus, type 2)   HTN (hypertension)   Acute renal failure   Dehydration   Nausea with vomiting   Hyperkalemia    Jason Mcpherson is a 73 y.o. male with  diabetes mellitus chronic kidney disease prior cellulitis admitted with acute onset of fevers large MV edema erythema cellulitis found to be bacteremic with group A strep and with severe cellulitis.   #1 group A strep cellulitis with bacteremia:   --Reculture blood and prove that we have sterilized blood  --Narrowed  to ceftriaxone 2 g daily  --My preference is for 2 weeks total parenteral antibiotics, he is reluctant to go home with PICC when I discussed today   If we did not go that route I would then prefer to send him home on Levaquin 555m daily (with high bioavailability) though I dislike its higher risk of CDI,   Augmentin 875/1272mbid  would be another option but I really think given his bacteremia we should be going for either parenteral therapy OR the drug with HIGHEST levels in blood (which would be a FQ) for total duration of 14 days (7 more days I dont feel as compulsive about him getting 14 days post documented clearance of bacteremia which would mean 13 days. If we were dealing with S. Aureus or FUngus then I would   #2 Ulceration in toe: His ESR and CRP are through the ceiling perhaps all related to his bacteremia. I would have thought that his plain films would've shown osteomyelitis by now given that he claims it is been present for several months.  I will order an MRI just to be sure   #3 screening check her for HIV and hepatitis viruses (latter back negative)  Dr. HaJohnnye Simaill be here tomorrow.    LOS: 6 days   CoTruman Hayward/23/2015, 3:28 PM

## 2013-08-23 NOTE — Progress Notes (Signed)
Physical Therapy Treatment Patient Details Name: Jason Mcpherson MRN: [id number] DOB: 1940/05/25 Today's Date: 08/23/2013    History of Present Illness 73 y.o. male presenting with cellulitis of RLE    PT Comments    Pt tolerated treatment well however is complaining of increasing right knee pain. He is ambulating up to 80 feet with min guard for safety. Pt will benefit from continued skilled PT services focusing on increasing independence with functional mobility.   Follow Up Recommendations  Supervision for mobility/OOB;Outpatient PT (Pt reports having a fall at home prior to cellulitis)     Equipment Recommendations  Rolling walker with 5" wheels;3in1 (PT)    Recommendations for Other Services OT consult     Precautions / Restrictions Precautions Precautions: None Restrictions Weight Bearing Restrictions: No    Mobility  Bed Mobility               General bed mobility comments: Pt was in recliner at start of therapy  Transfers Overall transfer level: Needs assistance Equipment used: Rolling walker (2 wheeled) Transfers: Sit to/from Stand Sit to Stand: Min guard         General transfer comment: Min guard for sit<>stand. Verbal cues for technique to scoot to edge before attempting stand from low surface. Pt did not need to rock to stand today, however was with decreased balance once  upright. He was able to correct himself but this took some time before he felt comfortable releasing the arm rests on the recliner and reached for the RW.  Ambulation/Gait Ambulation/Gait assistance: Min guard Ambulation Distance (Feet): 80 Feet Assistive device: Rolling walker (2 wheeled) Gait Pattern/deviations: Step-through pattern;Decreased step length - left;Decreased stance time - right;Decreased dorsiflexion - left;Decreased stride length;Steppage;Antalgic;Trunk flexed     General Gait Details: Pt continues to need verbal cues for upright posture and forward gaze.  Decreased dorsiflexion on L secondary to arthritis. Gait appears to be more antalgic today and he does report worsening pain in R knee. Cues for walker positioning within BOS.   Stairs            Wheelchair Mobility    Modified Rankin (Stroke Patients Only)       Balance                                    Cognition Arousal/Alertness: Awake/alert Behavior During Therapy: WFL for tasks assessed/performed Overall Cognitive Status: Within Functional Limits for tasks assessed                      Exercises General Exercises - Lower Extremity Ankle Circles/Pumps: AROM;Both;10 reps;Seated Long Arc Quad: Both;10 reps;Seated;AROM Hip Flexion/Marching: AROM;Both;10 reps;Standing Mini-Sqauts: AROM;Strengthening;Both;10 reps;Standing    General Comments General comments (skin integrity, edema, etc.): Pt complains of worsening R knee pain that was present yesterday but states has worsened. Continues to have Edema, erythema, and calor however these appear to have improved slightly from yesterday. Pt educated on exercises and importance of continued mobility.      Pertinent Vitals/Pain Pt reports pain 5/10 Nurse notified Patient repositioned in chair for comfort.     Home Living                      Prior Function            PT Goals (current goals can now be found in the care plan section) Acute Rehab  PT Goals Patient Stated Goal: Get back to work PT Goal Formulation: With patient Time For Goal Achievement: 08/29/13 Potential to Achieve Goals: Good Progress towards PT goals: Progressing toward goals    Frequency  Min 3X/week    PT Plan Current plan remains appropriate    Co-evaluation             End of Session Equipment Utilized During Treatment: Gait belt Activity Tolerance: Patient tolerated treatment well Patient left: in chair;with call bell/phone within reach     Time: 1311-1340 PT Time Calculation (min): 29  min  Charges:  $Gait Training: 8-22 mins $Therapeutic Exercise: 8-22 mins                    G Codes:      [place], [place]  Ellouise Newer 08/23/2013, 2:17 PM

## 2013-08-23 NOTE — Progress Notes (Signed)
Subjective: Patient seen and examined this morning, he continues to improve. Infectious disease input appreciated  Objective: Vital signs in last 24 hours: Temp:  [98.3 F (36.8 C)-99.5 F (37.5 C)] 98.8 F (37.1 C) (04/23 1313) Pulse Rate:  [81-92] 81 (04/23 1313) Resp:  [18] 18 (04/23 1313) BP: (123-130)/(50-60) 123/60 mmHg (04/23 1313) SpO2:  [97 %-98 %] 98 % (04/23 1313) Weight change:  Last BM Date: 08/21/13  Intake/Output from previous day: 04/22 0701 - 04/23 0700 In: [id number] [P.O.:950; I.V.:240] Out: 3150 [Urine:3150] Intake/Output this shift: Total I/O In: 500 [P.O.:500] Out: 1000 [Urine:1000]  General appearance: alert and cooperative Resp: clear to auscultation bilaterally Cardio: regular rate and rhythm, S1, S2 normal, no murmur, click, rub or gallop Extremities: improve right lower extremity edema, erythema, less warm. Pulses at the tip of second toe, no drainage, healing, 2+ pulse  Lab Results:  Results for orders placed during the hospital encounter of 08/17/13 (from the past 24 hour(s))  GLUCOSE, CAPILLARY     Status: Abnormal   Collection Time    08/22/13  4:46 PM      Result Value Ref Range   Glucose-Capillary 121 (*) 70 - 99 mg/dL  GLUCOSE, CAPILLARY     Status: Abnormal   Collection Time    08/22/13  9:39 PM      Result Value Ref Range   Glucose-Capillary 258 (*) 70 - 99 mg/dL  SEDIMENTATION RATE     Status: Abnormal   Collection Time    08/23/13  4:47 AM      Result Value Ref Range   Sed Rate 130 (*) 0 - 16 mm/hr  C-REACTIVE PROTEIN     Status: Abnormal   Collection Time    08/23/13  4:47 AM      Result Value Ref Range   CRP 16.3 (*) <0.60 mg/dL  HEPATITIS PANEL, ACUTE     Status: None   Collection Time    08/23/13  4:47 AM      Result Value Ref Range   Hepatitis B Surface Ag NEGATIVE  NEGATIVE   HCV Ab NEGATIVE  NEGATIVE   Hep A IgM NON REACTIVE  NON REACTIVE   Hep B C IgM NON REACTIVE  NON REACTIVE  GLUCOSE, CAPILLARY     Status:  Abnormal   Collection Time    08/23/13  6:38 AM      Result Value Ref Range   Glucose-Capillary 111 (*) 70 - 99 mg/dL  GLUCOSE, CAPILLARY     Status: Abnormal   Collection Time    08/23/13 11:23 AM      Result Value Ref Range   Glucose-Capillary 203 (*) 70 - 99 mg/dL   Comment 1 Documented in Chart     Comment 2 Notify RN        Studies/Results: No results found.  Medications:  Prior to Admission:  Prescriptions prior to admission  Medication Sig Dispense Refill  . aspirin 81 MG chewable tablet Chew 81 mg by mouth daily.      . ferrous sulfate 325 (65 FE) MG tablet Take 325 mg by mouth daily with breakfast.      . glipiZIDE (GLUCOTROL XL) 5 MG 24 hr tablet Take 5 mg by mouth daily.      . insulin detemir (LEVEMIR) 100 UNIT/ML injection Inject 16 Units into the skin at bedtime.       Marland Kitchen losartan (COZAAR) 100 MG tablet Take 100 mg by mouth daily.      Marland Kitchen  metFORMIN (GLUCOPHAGE) 1000 MG tablet Take 1,000 mg by mouth 2 (two) times daily with a meal.      . simvastatin (ZOCOR) 20 MG tablet Take 20 mg by mouth every evening.      . Tetrahydrozoline HCl (EYE DROPS OP) Place 1 drop into both eyes 2 (two) times daily as needed (dry eyes).       Scheduled: . aspirin  81 mg Oral Daily  . cefTRIAXone (ROCEPHIN)  IV  2 g Intravenous Q24H  . docusate sodium  100 mg Oral BID  . enoxaparin (LOVENOX) injection  40 mg Subcutaneous QHS  . insulin aspart  0-5 Units Subcutaneous QHS  . insulin aspart  0-9 Units Subcutaneous TID WC  . insulin detemir  12 Units Subcutaneous QHS  . pneumococcal 23 valent vaccine  0.5 mL Intramuscular Tomorrow-1000  . simvastatin  20 mg Oral QPM  . sodium chloride  3 mL Intravenous Q12H  . tetrahydrozoline  1 drop Both Eyes BID   Continuous: . sodium chloride 20 mL/hr (08/21/13 1111)   [id number], acetaminophen, HYDROcodone-acetaminophen, metoCLOPramide (REGLAN) injection, ondansetron (ZOFRAN) IV, ondansetron  Assessment/Plan: Cellulitis - treated with  Vanc/zosyn initally , xray of leg and foot- ok.; doppler negative for DVT.  Blood Culture x1 bottle positive with , group a strep . Above comments by ID noted, patient reluctant to have IV antibiotics at home. For now continue IV Rocephin,I will review with patient again in the a.m. The recommendations of infectious disease and allow him to make his decision. Marland Kitchen DM2 (diabetes mellitus, type 2) - hold metfromin, decrease levemir, SSI; stop glipizide BS in 90-100 . HTN (hypertension) - off ARB due to ARF  . Acute renal failure - likely due to dehydration  . Dehydration - likely due to Nausea vomiting, improve, KVO IV fluids . Nausea with vomiting -resolved  . Hyperkalemia - resolve- off ARB/ in setting of ARF  Anemia - cbc ck- iron panel. hemoccult negative    LOS: 6 days   [name] 08/23/2013, 3:49 PM

## 2013-08-24 LAB — CREATININE, SERUM
Creatinine, Ser: 1.28 mg/dL (ref 0.50–1.35)
GFR calc Af Amer: 62 mL/min — ABNORMAL LOW (ref >90)
GFR, EST NON AFRICAN AMERICAN: 53 mL/min — AB (ref >90)

## 2013-08-24 LAB — HIV-1 RNA QUANT-NO REFLEX-BLD
HIV 1 RNA Quant: <20 copies/mL (ref <20)
HIV-1 RNA Quant, Log: <1.3 {Log} (ref <1.30)

## 2013-08-24 LAB — GLUCOSE, CAPILLARY
GLUCOSE-CAPILLARY: 175 mg/dL — AB (ref 70–99)
GLUCOSE-CAPILLARY: 155 mg/dL — AB (ref 70–99)
Glucose-Capillary: 116 mg/dL — ABNORMAL HIGH (ref 70–99)
Glucose-Capillary: 187 mg/dL — ABNORMAL HIGH (ref 70–99)

## 2013-08-24 MED ORDER — SODIUM CHLORIDE 0.9 % IJ SOLN
10.0000 mL | INTRAMUSCULAR | Status: DC | PRN
  Administered 2013-08-25: 10 mL

## 2013-08-24 NOTE — Progress Notes (Signed)
Subjective: No problem overnight, MRI negative for osteal. Patient continues with IV Rocephin. Apparently per discussion with nurse a few moments ago patient did not do well with physical therapy and has agreed to a skilled nursing facility  Objective: Vital signs in last 24 hours: Temp:  [98.3 F (36.8 C)-99.6 F (37.6 C)] 98.3 F (36.8 C) (04/24 0634) Pulse Rate:  [81-85] 83 (04/24 0634) Resp:  [18] 18 (04/24 0634) BP: (111-123)/(56-60) 111/56 mmHg (04/24 0634) SpO2:  [98 %] 98 % (04/24 0634) Weight change:  Last BM Date: 08/21/13  Intake/Output from previous day: 04/23 0701 - 04/24 0700 In: 1490 [P.O.:1000; I.V.:490] Out: 3000 [Urine:3000] Intake/Output this shift: Total I/O In: 500 [P.O.:500] Out: 1000 [Urine:1000]  General appearance: alert and cooperative Resp: clear to auscultation bilaterally Cardio: regular rate and rhythm, S1, S2 normal, no murmur, click, rub or gallop Extremities: continued improvement in right lower extremity cellulitis,  Lab Results:  Results for orders placed during the hospital encounter of 08/17/13 (from the past 24 hour(s))  GLUCOSE, CAPILLARY     Status: Abnormal   Collection Time    08/23/13  4:48 PM      Result Value Ref Range   Glucose-Capillary 119 (*) 70 - 99 mg/dL   Comment 1 Documented in Chart     Comment 2 Notify RN    GLUCOSE, CAPILLARY     Status: Abnormal   Collection Time    08/23/13 10:12 PM      Result Value Ref Range   Glucose-Capillary 168 (*) 70 - 99 mg/dL  CREATININE, SERUM     Status: Abnormal   Collection Time    08/24/13  4:12 AM      Result Value Ref Range   Creatinine, Ser 1.28  0.50 - 1.35 mg/dL   GFR calc non Af Amer 53 (*) >90 mL/min   GFR calc Af Amer 62 (*) >90 mL/min  GLUCOSE, CAPILLARY     Status: Abnormal   Collection Time    08/24/13  6:28 AM      Result Value Ref Range   Glucose-Capillary 116 (*) 70 - 99 mg/dL  GLUCOSE, CAPILLARY     Status: Abnormal   Collection Time    08/24/13 11:12 AM       Result Value Ref Range   Glucose-Capillary 175 (*) 70 - 99 mg/dL   Comment 1 Documented in Chart     Comment 2 Notify RN        Studies/Results: Mr Foot Right W Wo Contrast  08/24/2013   CLINICAL DATA:  73 year old male with diabetes, hypertension, prior episode of cellulitis presents to the ER on April 17 for swelling of the right leg, ulceration in his right toe  EXAM: MRI OF THE RIGHT FOREFOOT WITHOUT AND WITH CONTRAST  TECHNIQUE: Multiplanar, multisequence MR imaging was performed both before and after administration of intravenous contrast.  CONTRAST:  49mL MULTIHANCE GADOBENATE DIMEGLUMINE 529 MG/ML IV SOLN  COMPARISON:  None.  FINDINGS: There is no focal marrow signal abnormality. There is no fracture or dislocation. There is no periosteal reaction. There is no significant soft tissue edema of the second digit. There is no area of abnormal enhancement. There is no fluid collection or hematoma. The soft tissues enhance normally and homogeneously. There is no joint effusion. There is mild osteoarthritis of the first MTP joint. There is mild osteoarthritis of the first and second tarsometatarsal joints.  IMPRESSION: No evidence of osteomyelitis of the right foot.   Electronically Signed  By: Kathreen Devoid   On: 08/24/2013 08:08    Medications:  Prior to Admission:  Prescriptions prior to admission  Medication Sig Dispense Refill  . aspirin 81 MG chewable tablet Chew 81 mg by mouth daily.      . ferrous sulfate 325 (65 FE) MG tablet Take 325 mg by mouth daily with breakfast.      . glipiZIDE (GLUCOTROL XL) 5 MG 24 hr tablet Take 5 mg by mouth daily.      . insulin detemir (LEVEMIR) 100 UNIT/ML injection Inject 16 Units into the skin at bedtime.       Marland Kitchen losartan (COZAAR) 100 MG tablet Take 100 mg by mouth daily.      . metFORMIN (GLUCOPHAGE) 1000 MG tablet Take 1,000 mg by mouth 2 (two) times daily with a meal.      . simvastatin (ZOCOR) 20 MG tablet Take 20 mg by mouth every evening.       . Tetrahydrozoline HCl (EYE DROPS OP) Place 1 drop into both eyes 2 (two) times daily as needed (dry eyes).       Scheduled: . aspirin  81 mg Oral Daily  . cefTRIAXone (ROCEPHIN)  IV  2 g Intravenous Q24H  . docusate sodium  100 mg Oral BID  . enoxaparin (LOVENOX) injection  40 mg Subcutaneous QHS  . insulin aspart  0-5 Units Subcutaneous QHS  . insulin aspart  0-9 Units Subcutaneous TID WC  . insulin detemir  12 Units Subcutaneous QHS  . pneumococcal 23 valent vaccine  0.5 mL Intramuscular Tomorrow-1000  . simvastatin  20 mg Oral QPM  . sodium chloride  3 mL Intravenous Q12H  . tetrahydrozoline  1 drop Both Eyes BID   Continuous: . sodium chloride 20 mL/hr at 08/24/13 [id number]   [id number], acetaminophen, HYDROcodone-acetaminophen, metoCLOPramide (REGLAN) injection, ondansetron (ZOFRAN) IV, ondansetron  Assessment/Plan: Cellulitis - treated with Vanc/zosyn initally , xray of leg and foot- ok.; doppler negative for DVT. MRI negative for osteomyelitis, currently tolerating IV Rocephin Blood Culture x1 bottle positive with , group a strep . Total of 2 weeks antibiotic treatment recommended. Patient does not desire IV antibiotic at home however it appears that he may need skilled nursing facility. Await further input from social worker in regards to placement. I have updated patient's wife in detail. Marland Kitchen DM2 (diabetes mellitus, type 2) - hold metfromin, decrease levemir, SSI; stop glipizide BS in 90-100 . HTN (hypertension) - off ARB due to ARF  . Acute renal failure - likely due to dehydration  . Dehydration - likely due to Nausea vomiting, improve, KVO IV fluids . Nausea with vomiting -resolved  . Hyperkalemia - resolve- off ARB/ in setting of ARF  Anemia - cbc ck- iron panel. hemoccult negative    LOS: 7 days   [name] 08/24/2013, 12:14 PM

## 2013-08-24 NOTE — Care Management Note (Signed)
CARE MANAGEMENT NOTE 08/24/2013  Patient:  Jason Mcpherson, Jason Mcpherson   Account Number:  [id number]  Date Initiated:  08/24/2013  Documentation initiated by:  Ricki Miller  Subjective/Objective Assessment:   73 year old male admitted with cellulitis of right leg.     Action/Plan:   Patient progressing poorly with therapy. May also require IV antibiotics. Patient will need shortterm rehab at [facility]. Social orker is aware.   Anticipated DC Date:  08/25/2013   Anticipated DC Plan:  SKILLED NURSING FACILITY  In-house referral  Clinical Social Worker      DC Planning Services  CM consult      [facility] Choice  NA   Choice offered to / List presented to:             Status of service:  Completed, signed off Medicare Important Message given?   (If response is "NO", the following Medicare IM given date fields will be blank) Date Medicare IM given:   Date Additional Medicare IM given:    Discharge Disposition:  [facility]  Per UR Regulation:

## 2013-08-24 NOTE — Progress Notes (Signed)
Physical Therapy Treatment Patient Details Name: Jason Mcpherson MRN: [id number] DOB: 1941-04-27 Today's Date: 08/24/2013    History of Present Illness 73 y.o. male presenting with cellulitis of RLE    PT Comments    Pt progressing slowly with physical therapy. Expected pt function to improve more than it currently is. Pt required assistance due to loss of balance with sit to stand from bed this AM. Due to pt slow progression, comorbidities, and lack of care at home (wife with recent surgery) updating d/c recommendation to SNF for further rehabilitation in order to improve independence with functional mobility. Pt was independent and working full time prior to cellulitis and now requires assistance for safe mobility. He will continue to be seen by skilled PT while in acute setting until d/c to next venue of care.   Follow Up Recommendations  SNF;Supervision/Assistance - 24 hour     Equipment Recommendations  Rolling walker with 5" wheels;3in1 (PT)    Recommendations for Other Services       Precautions / Restrictions Precautions Precautions: Fall Restrictions Weight Bearing Restrictions: No    Mobility  Bed Mobility               General bed mobility comments: Pt was in recliner at start of therapy  Transfers Overall transfer level: Needs assistance Equipment used: Rolling walker (2 wheeled) Transfers: Sit to/from Stand Sit to Stand: Min assist         General transfer comment: Pt required Min assist today due to loss of balance to posterior with sit to stand transition. Still requires considerable amount of time to stand from lowest bed setting. Difficulty controling decent when sitting in reclining chair.  Ambulation/Gait Ambulation/Gait assistance: Min guard Ambulation Distance (Feet): 80 Feet Assistive device: Rolling walker (2 wheeled) Gait Pattern/deviations: Step-through pattern;Decreased step length - left;Decreased stance time - right;Decreased  dorsiflexion - left;Decreased stride length;Steppage;Antalgic;Trunk flexed     General Gait Details: Pt with steppage gait secondary to left ankle arthritis decreasing dorsiflexion. Pt states knee pain is still present at rest and during ambulation. Antalgic pattern is still present. Verbal cues for upright posture, needs frequently reminded. Continues to need cues to keep RW within BOS.   Stairs            Wheelchair Mobility    Modified Rankin (Stroke Patients Only)       Balance                                    Cognition Arousal/Alertness: Awake/alert Behavior During Therapy: WFL for tasks assessed/performed Overall Cognitive Status: Within Functional Limits for tasks assessed                      Exercises General Exercises - Lower Extremity Ankle Circles/Pumps: AROM;Both;10 reps;Seated Long Arc Quad: Both;10 reps;Seated;AROM Hip Flexion/Marching: AROM;Both;10 reps;Standing    General Comments General comments (skin integrity, edema, etc.): Pt continues to complain of R knee pain at rest and with movement. Reports he had to have his knee repositioned during an MRI procedure yesterday due to significant pain. PT reviewed therapeutic exercises and encouraged frequent mobility with assistance to improve endurance. Discussed in detail, recommendations for d/c. Pt reports his wife is not well currently due to a recent surgery and is worried she will not be able to care for him. He states he does not believe he will be able to get  up safely to answer the door if he were to have home health PT come for further rehabilitaion. Requests SNF placement short term until his function improves      Pertinent Vitals/Pain Pt reports mild-moderate pain currently Pt repositioned in chair for comfort.    Home Living                      Prior Function            PT Goals (current goals can now be found in the care plan section) Acute Rehab PT  Goals Patient Stated Goal: Get back to work PT Goal Formulation: With patient Time For Goal Achievement: 08/29/13 Potential to Achieve Goals: Good Progress towards PT goals: Progressing toward goals    Frequency  Min 3X/week    PT Plan Discharge plan needs to be updated    Co-evaluation             End of Session Equipment Utilized During Treatment: Gait belt Activity Tolerance: Patient tolerated treatment well Patient left: in chair;with call bell/phone within reach     Time: 1059-1130 PT Time Calculation (min): 31 min  Charges:  $Gait Training: 8-22 mins $Self Care/Home Management: 8-22                    G Codes:      Elayne Snare, [place] 08/24/2013, 12:29 PM

## 2013-08-24 NOTE — Care Management Note (Signed)
08/24/13 Ricki Miller, RN BSN Case Manager Patient to get PICC line for seven days of IV Rocephin, will discharge to [facility] on saturday 08/25/13.

## 2013-08-24 NOTE — Progress Notes (Addendum)
Patient has a bed at [facility]. Please contact Raquel [phone]) before patient is transported to the facility.   Rhea Pink, MSW, [place]

## 2013-08-24 NOTE — Progress Notes (Signed)
Peripherally Inserted Central Catheter/Midline Placement  The IV Nurse has discussed with the patient and/or persons authorized to consent for the patient, the purpose of this procedure and the potential benefits and risks involved with this procedure.  The benefits include less needle sticks, lab draws from the catheter and patient may be discharged home with the catheter.  Risks include, but not limited to, infection, bleeding, blood clot (thrombus formation), and puncture of an artery; nerve damage and irregular heat beat.  Alternatives to this procedure were also discussed.  PICC/Midline Placement Documentation        Jason Mcpherson Jason Mcpherson 08/24/2013, 6:50 PM

## 2013-08-24 NOTE — Progress Notes (Addendum)
INFECTIOUS DISEASE PROGRESS NOTE  ID: Jason Mcpherson is a 73 y.o. male with  Active Problems:   Cellulitis   DM2 (diabetes mellitus, type 2)   HTN (hypertension)   Acute renal failure   Dehydration   Nausea with vomiting   Hyperkalemia  Subjective: No BM x 4 days Feels like leg is better  Abtx:  Anti-infectives   Start     Dose/Rate Route Frequency Ordered Stop   08/22/13 1400  cefTRIAXone (ROCEPHIN) 2 g in dextrose 5 % 50 mL IVPB     2 g 100 mL/hr over 30 Minutes Intravenous Every 24 hours 08/22/13 1321     08/18/13 1800  vancomycin (VANCOCIN) 1,250 mg in sodium chloride 0.9 % 250 mL IVPB  Status:  Discontinued     1,250 mg 166.7 mL/hr over 90 Minutes Intravenous Every 24 hours 08/17/13 1911 08/22/13 1321   08/17/13 2200  piperacillin-tazobactam (ZOSYN) IVPB 3.375 g  Status:  Discontinued     3.375 g 12.5 mL/hr over 240 Minutes Intravenous 3 times per day 08/17/13 1911 08/22/13 1321   08/17/13 1915  vancomycin (VANCOCIN) 500 mg in sodium chloride 0.9 % 100 mL IVPB     500 mg 100 mL/hr over 60 Minutes Intravenous NOW 08/17/13 1910 08/17/13 2129   08/17/13 1400  vancomycin (VANCOCIN) IVPB 1000 mg/200 mL premix     1,000 mg 200 mL/hr over 60 Minutes Intravenous  Once 08/17/13 1355 08/17/13 1554   08/17/13 1400  piperacillin-tazobactam (ZOSYN) IVPB 3.375 g     3.375 g 12.5 mL/hr over 240 Minutes Intravenous  Once 08/17/13 1355 08/17/13 1816      Medications:  Scheduled: . aspirin  81 mg Oral Daily  . cefTRIAXone (ROCEPHIN)  IV  2 g Intravenous Q24H  . docusate sodium  100 mg Oral BID  . enoxaparin (LOVENOX) injection  40 mg Subcutaneous QHS  . insulin aspart  0-5 Units Subcutaneous QHS  . insulin aspart  0-9 Units Subcutaneous TID WC  . insulin detemir  12 Units Subcutaneous QHS  . pneumococcal 23 valent vaccine  0.5 mL Intramuscular Tomorrow-1000  . simvastatin  20 mg Oral QPM  . sodium chloride  3 mL Intravenous Q12H  . tetrahydrozoline  1 drop Both Eyes BID      Objective: Vital signs in last 24 hours: Temp:  [98.3 F (36.8 C)-99.6 F (37.6 C)] 98.3 F (36.8 C) (04/24 0634) Pulse Rate:  [81-85] 83 (04/24 0634) Resp:  [18] 18 (04/24 0634) BP: (111-123)/(56-60) 111/56 mmHg (04/24 0634) SpO2:  [98 %] 98 % (04/24 0634)   General appearance: alert, cooperative and no distress Skin: RLE erythema, mild-mod warmth. toe lesion is unchanged.   Lab Results  Recent Labs  08/22/13 0450 08/24/13 0412  WBC 12.9*  --   HGB 9.3*  --   HCT 29.3*  --   NA 141  --   K 4.2  --   CL 106  --   CO2 23  --   BUN 23  --   CREATININE 1.17 1.28   Liver Panel No results found for this basename: PROT, ALBUMIN, AST, ALT, ALKPHOS, BILITOT, BILIDIR, IBILI,  in the last 72 hours Sedimentation Rate  Recent Labs  08/23/13 0447  ESRSEDRATE 130*   C-Reactive Protein  Recent Labs  08/23/13 0447  CRP 16.3*    Microbiology: Recent Results (from the past 240 hour(s))  CULTURE, BLOOD (ROUTINE X 2)     Status: None   Collection Time  08/17/13  2:10 PM      Result Value Ref Range Status   Specimen Description BLOOD LEFT AC   Final   Special Requests NONE BOTTLES DRAWN AEROBIC AND ANAEROBIC  5CC   Final   Culture  Setup Time     Final   Value: 08/17/2013 19:16     Performed at [facility]   Culture     Final   Value: NO GROWTH 5 DAYS     Performed at [facility]   Report Status 08/23/2013 FINAL   Final  CULTURE, BLOOD (ROUTINE X 2)     Status: None   Collection Time    08/17/13  2:15 PM      Result Value Ref Range Status   Specimen Description BLOOD  LEFT FA   Final   Special Requests NONE BOTTLES DRAWN AEROBIC AND ANAEROBIC  5CC   Final   Culture  Setup Time     Final   Value: 08/17/2013 19:16     Performed at [facility]   Culture     Final   Value: GROUP A STREP (S.PYOGENES) ISOLATED     Note: CRITICAL RESULT CALLED TO, READ BACK BY AND VERIFIED WITH: ADRIAN BULLOCK 08/19/13 @ 10:23AM BY RUSCOE A. FAXED TO  [facility] BETTY ROGERS [id number] BY LYONK     Note: Gram Stain Report Called to,Read Back By and Verified With: DONNA NIEMELA 08/18/13 @ 12:28PM BY RUSCOE A.     Performed at [facility]   Report Status 08/21/2013 FINAL   Final  URINE CULTURE     Status: None   Collection Time    08/17/13 10:21 PM      Result Value Ref Range Status   Specimen Description URINE, RANDOM   Final   Special Requests NONE   Final   Culture  Setup Time     Final   Value: 08/18/2013 12:53     Performed at [place]     Final   Value: NO GROWTH     Performed at [facility]   Culture     Final   Value: NO GROWTH     Performed at [facility]   Report Status 08/19/2013 FINAL   Final  CULTURE, BLOOD (ROUTINE X 2)     Status: None   Collection Time    08/22/13  3:15 PM      Result Value Ref Range Status   Specimen Description BLOOD RIGHT ARM   Final   Special Requests     Final   Value: BOTTLES DRAWN AEROBIC AND ANAEROBIC 10CC AER,8CC ANA   Culture  Setup Time     Final   Value: 08/22/2013 19:31     Performed at [facility]   Culture     Final   Value:        BLOOD CULTURE RECEIVED NO GROWTH TO DATE CULTURE WILL BE HELD FOR 5 DAYS BEFORE ISSUING A FINAL NEGATIVE REPORT     Performed at [facility]   Report Status PENDING   Incomplete  CULTURE, BLOOD (ROUTINE X 2)     Status: None   Collection Time    08/22/13  3:25 PM      Result Value Ref Range Status   Specimen Description BLOOD RIGHT HAND   Final   Special Requests BOTTLES DRAWN AEROBIC ONLY 10CC   Final   Culture  Setup  Time     Final   Value: 08/22/2013 19:31     Performed at [facility]   Culture     Final   Value:        BLOOD CULTURE RECEIVED NO GROWTH TO DATE CULTURE WILL BE HELD FOR 5 DAYS BEFORE ISSUING A FINAL NEGATIVE REPORT     Performed at [facility]   Report Status PENDING   Incomplete    Studies/Results: Mr Foot Right W Wo  Contrast  08/24/2013   CLINICAL DATA:  73 year old male with diabetes, hypertension, prior episode of cellulitis presents to the ER on April 17 for swelling of the right leg, ulceration in his right toe  EXAM: MRI OF THE RIGHT FOREFOOT WITHOUT AND WITH CONTRAST  TECHNIQUE: Multiplanar, multisequence MR imaging was performed both before and after administration of intravenous contrast.  CONTRAST:  1mL MULTIHANCE GADOBENATE DIMEGLUMINE 529 MG/ML IV SOLN  COMPARISON:  None.  FINDINGS: There is no focal marrow signal abnormality. There is no fracture or dislocation. There is no periosteal reaction. There is no significant soft tissue edema of the second digit. There is no area of abnormal enhancement. There is no fluid collection or hematoma. The soft tissues enhance normally and homogeneously. There is no joint effusion. There is mild osteoarthritis of the first MTP joint. There is mild osteoarthritis of the first and second tarsometatarsal joints.  IMPRESSION: No evidence of osteomyelitis of the right foot.   Electronically Signed   By: Kathreen Devoid   On: 08/24/2013 08:08     Assessment/Plan: Group A strep Cellulitis with bacteremia  MRI shows no osteo  Total days of antibiotics: 7  Aim for 7 more days of ceftriaxone Will place PIC Heading to SNF available if questions F/u with PCP         [facility] (pager) [phone] [email or web address] 08/24/2013, 12:35 PM  LOS: 7 days

## 2013-08-24 NOTE — Clinical Social Work Psychosocial (Signed)
Clinical Social Work Department  BRIEF PSYCHOSOCIAL ASSESSMENT  Patient: Jason Mcpherson  Account Number: [id number]  Admit date: 08/17/13 Clinical Social Worker Rhea Pink, MSW Date/Time: 08/24/2013  Referred by: Physician Date Referred: 08/24/2013 Referred for   SNF Placement   Other Referral:  Interview type: Patient and patient's wife Other interview type: PSYCHOSOCIAL DATA  Living Status: wife Admitted from facility:  Level of care:  Primary support name: Selinda Eon Primary support relationship to patient: Spouse Degree of support available:  Strong and vested- per patient  CURRENT CONCERNS  Current Concerns   Post-Acute Placement   Other Concerns:  SOCIAL WORK ASSESSMENT / PLAN  CSW met with pt at bedside to offer support and discuss placement. Patient reported that his wife recently had knee surgery and can't help him at home. Patient reported that he needs thearpy to get stronger. CSW asked patient about Medicare benefits. The patient and his wife do not have Medicare because the patient is still currently working. Patient is agreeable SNF and was appreciative of CSW help.  re: PT recommendation for SNF.   Pt lives with spouse  CSW explained placement process and answered questions.   Pt reports [facility]  as his preference    CSW completed FL2 and initiated SNF search.     Assessment/plan status: Information/Referral to [employer]  Other assessment/ plan:  Information/referral to community resources:  SNF   PTAR  PATIENT'S/FAMILY'S RESPONSE TO PLAN OF CARE:  Pt  reports he is agreeable to ST SNF in order to increase strength and independence with mobility prior to returning home  Pt verbalized understanding of placement process and appreciation for CSW assist.   Rhea Pink, MSW, [place]

## 2013-08-24 NOTE — Progress Notes (Addendum)
Clinical Social Work Department  CLINICAL SOCIAL WORK PLACEMENT NOTE    Patient: Jason Mcpherson Account Number: [id number]   Admit date: 08/17/13 Clinical Social Worker: Rhea Pink LCSWA Date/time: 4/24/201511:30 AM  Clinical Social Work is seeking post-discharge placement for this patient at the following level of care: SKILLED NURSING (*CSW will update this form in Epic as items are completed)  08/24/2013 Patient/family provided with [place] Department of Clinical Social Work's list of facilities offering this level of care within the geographic area requested by the patient (or if unable, by the patient's family).  08/24/2013  Patient/family informed of their freedom to choose among providers that offer the needed level of care, that participate in Medicare, Medicaid or managed care program needed by the patient, have an available bed and are willing to accept the patient.  08/24/2013  Patient/family informed of [facility]' ownership interest in [facility], as well as of the fact that they are under no obligation to receive care at this facility.  PASARR submitted to EDS on  08/24/2013 PASARR number received from EDS on 08/24/2013 FL2 transmitted to all facilities in geographic area requested by pt/family on 08/24/2013  FL2 transmitted to all facilities within larger geographic area on  Patient informed that his/her managed care company has contracts with or will negotiate with certain facilities, including the following:  Patient/family informed of bed offers received: 08/24/2013 Patient chooses bed at [facility] Physician recommends and patient chooses bed at  Patient to be transferred to [facility] on 08/25/13- Blima Rich, [place]   Patient to be transferred to facility by [facility]  The following physician request were entered in Epic:  Additional Comments:

## 2013-08-25 DIAGNOSIS — R7881 Bacteremia: Secondary | ICD-10-CM

## 2013-08-25 LAB — GLUCOSE, CAPILLARY
GLUCOSE-CAPILLARY: 111 mg/dL — AB (ref 70–99)
Glucose-Capillary: 136 mg/dL — ABNORMAL HIGH (ref 70–99)

## 2013-08-25 MED ORDER — HEPARIN SOD (PORK) LOCK FLUSH 100 UNIT/ML IV SOLN
250.0000 [IU] | INTRAVENOUS | Status: AC | PRN
  Administered 2013-08-25: 250 [IU]

## 2013-08-25 MED ORDER — DEXTROSE 5 % IV SOLN: 2.0000 g | INTRAVENOUS | Status: AC

## 2013-08-25 MED ORDER — INSULIN ASPART 100 UNIT/ML ~~LOC~~ SOLN: 0.0000 [IU] | Freq: Three times a day (TID) | SUBCUTANEOUS | Status: DC

## 2013-08-25 MED ORDER — HYDROCODONE-ACETAMINOPHEN 5-325 MG PO TABS: 1.0000 | ORAL_TABLET | ORAL | Status: DC | PRN

## 2013-08-25 NOTE — Discharge Summary (Signed)
Physician Discharge Summary  Patient ID: Jason Mcpherson MRN: [id number] DOB/AGE: 74/09/1940 14 y.o.  Admit date: 08/17/2013 Discharge date: 08/25/2013  Admission Diagnoses:  Discharge Diagnoses:  Active Problems:   Cellulitis   DM2 (diabetes mellitus, type 2)   HTN (hypertension)   Acute renal failure   Dehydration   Nausea with vomiting   Hyperkalemia   Bacteremia   Discharged Condition: stable  Hospital Course:  Patient presented to the hospital with right leg pain, in emergency room he was diagnosed with cellulitis. He presented with nausea vomiting, dehydration, labs show acute renal failure and dehydration. Admission was deemed necessary for further evaluation and treatment. Patient was started on broad-spectrum antibiotics, vancomycin and Zosyn. Blood cultures were obtained, eventually showing group A strep one out of 2 blood cultures. Patient had x-rays of extremity without signs of osteo-, Dopplers negative for DVT. He was seen in consultation by infectious disease ultimately MRI was ordered and it was negative for osteomyelitis as well. Due  to group A strep bacteremia and a significant cellulitis he was recommended to continue a total 2 week course of parenteral antibiotic. Patient will be discharged on Rocephin. Patient had resolution of leukocytosis, acute renal failure during his hospitalization. Of note his metformin was held as well as his ARB. These can be resumed on an outpatient basis as his clinical course dictates. Patient was seen by physical therapy, it was recommended that he have skilled nursing facility.  Consults:   infectious disease  Significant Diagnostic Studies:Dg Tibia/fibula Right  08/18/2013   CLINICAL DATA:  Evaluate for osteomyelitis.  EXAM: RIGHT TIBIA AND FIBULA - 2 VIEW  COMPARISON:  None.  FINDINGS: There is no evidence of fracture or other focal bone lesions. Soft tissues are unremarkable. Joint space narrowing and degenerative spurring within the  right knee. No radiographic changes of osteomyelitis.  IMPRESSION: No acute bony abnormality.   Electronically Signed   By: Rolm Baptise M.D.   On: 08/18/2013 00:43   Mr Foot Right W Wo Contrast  08/24/2013   CLINICAL DATA:  73 year old male with diabetes, hypertension, prior episode of cellulitis presents to the ER on April 17 for swelling of the right leg, ulceration in his right toe  EXAM: MRI OF THE RIGHT FOREFOOT WITHOUT AND WITH CONTRAST  TECHNIQUE: Multiplanar, multisequence MR imaging was performed both before and after administration of intravenous contrast.  CONTRAST:  16mL MULTIHANCE GADOBENATE DIMEGLUMINE 529 MG/ML IV SOLN  COMPARISON:  None.  FINDINGS: There is no focal marrow signal abnormality. There is no fracture or dislocation. There is no periosteal reaction. There is no significant soft tissue edema of the second digit. There is no area of abnormal enhancement. There is no fluid collection or hematoma. The soft tissues enhance normally and homogeneously. There is no joint effusion. There is mild osteoarthritis of the first MTP joint. There is mild osteoarthritis of the first and second tarsometatarsal joints.  IMPRESSION: No evidence of osteomyelitis of the right foot.   Electronically Signed   By: Kathreen Devoid   On: 08/24/2013 08:08   US Venous Img Lower Unilateral Right  08/17/2013   CLINICAL DATA:  Patient complains of redness and warmth for 1 day. Streaking and redness throughout the leg. The patient denies pain.  EXAM: Right LOWER EXTREMITY VENOUS DOPPLER ULTRASOUND  TECHNIQUE: Gray-scale sonography with graded compression, as well as color Doppler and duplex ultrasound were performed to evaluate the lower extremity deep venous systems from the level of the common femoral vein  and including the common femoral, femoral, profunda femoral, popliteal and calf veins including the posterior tibial, peroneal and gastrocnemius veins when visible. The superficial great saphenous vein was also  interrogated. Spectral Doppler was utilized to evaluate flow at rest and with distal augmentation maneuvers in the common femoral, femoral and popliteal veins.  COMPARISON:  None.  FINDINGS: Common Femoral Vein: No evidence of thrombus. Normal compressibility, respiratory phasicity and response to augmentation.  Saphenofemoral Junction: No evidence of thrombus. Normal compressibility and flow on color Doppler imaging.  Profunda Femoral Vein: No evidence of thrombus. Normal compressibility and flow on color Doppler imaging.  Femoral Vein: No evidence of thrombus. Normal compressibility, respiratory phasicity and response to augmentation.  Popliteal Vein: No evidence of thrombus. Normal compressibility, respiratory phasicity and response to augmentation.  Calf Veins: Limited evaluation.  Superficial Great Saphenous Vein: No evidence of thrombus. Normal compressibility and flow on color Doppler imaging.  Other Findings: Multiple varicosities are identified within the calf, none identified containing clots. Complex fluid collection is identified in the right popliteal fossa region measuring 6.8 x 1.7 x 2.9 centimeters.  IMPRESSION: 1. No evidence for occlusive deep venous thrombosis. 2. Calf varicosities. 3. Popliteal fossa fluid collection.   Electronically Signed   By: Shon Hale M.D.   On: 08/17/2013 15:25   Dg Foot 2 Views Right  08/18/2013   CLINICAL DATA:  Evaluate for osteomyelitis.  EXAM: RIGHT FOOT - 2 VIEW  COMPARISON:  None.  FINDINGS: Degenerative changes throughout the right foot. Mild pes planus. Plantar calcaneal spur. No fracture, subluxation or dislocation. No radiographic changes of osteomyelitis.  IMPRESSION: No acute bony abnormality.   Electronically Signed   By: Rolm Baptise M.D.   On: 08/18/2013 00:44    One out of 2 blood culture showed group A strep  Patient had a second set of blood cultures ordered from April 22 which are still negative to date  Discharge creatinine 1.2 last CBC  showed a white count of 12.9 hemoglobin 9.3 platelet 176, Hemoccult negative  Discharge Exam: Blood pressure 104/52, pulse 80, temperature 98.4 F (36.9 C), temperature source Oral, resp. rate 18, height 5\' 8"  (1.727 m), weight 77.111 kg (170 lb), SpO2 99.00%. General appearance: alert and cooperative Resp: clear to auscultation bilaterally Extremities: Slowly improving right lower extremity warmth erythema, patient still has some calf tenderness. Patient does have a healing ulceration at the tip of second toe, there is no drainage no foul odor  Disposition: Skilled nursing facility     Medication List    STOP taking these medications       glipiZIDE 5 MG 24 hr tablet  Commonly known as:  GLUCOTROL XL     losartan 100 MG tablet  Commonly known as:  COZAAR     metFORMIN 1000 MG tablet  Commonly known as:  GLUCOPHAGE      TAKE these medications       aspirin 81 MG chewable tablet  Chew 81 mg by mouth daily.     cefTRIAXone 2 g in dextrose 5 % 50 mL  Inject 2 g into the vein daily.     EYE DROPS OP  Place 1 drop into both eyes 2 (two) times daily as needed (dry eyes).     ferrous sulfate 325 (65 FE) MG tablet  Take 325 mg by mouth daily with breakfast.     HYDROcodone-acetaminophen 5-325 MG per tablet  Commonly known as:  NORCO/VICODIN  Take 1-2 tablets by mouth every 4 (four)  hours as needed for moderate pain.     insulin aspart 100 UNIT/ML injection  Commonly known as:  novoLOG  Inject 0-9 Units into the skin 3 (three) times daily with meals.     insulin detemir 100 UNIT/ML injection  Commonly known as:  LEVEMIR  Inject 16 Units into the skin at bedtime.     simvastatin 20 MG tablet  Commonly known as:  ZOCOR  Take 20 mg by mouth every evening.           Follow-up Information   Follow up with Callie Facey D, MD In 2 weeks.   Specialty:  Internal Medicine   Contact information:   [address] [place] [address] [phone]       35 minutes were spent in the discharge process of this patient, medication reconciliation, discussing with family and patient Signed: [name] 08/25/2013, 10:01 AM

## 2013-08-25 NOTE — Progress Notes (Signed)
Plans to proceed  With calling report to facility. F2L@ may be signed and  Faxed at a later date. IV ATB will be given prior to arranging EMS d/t peak serum levels for maximun benefit

## 2013-08-25 NOTE — Progress Notes (Signed)
Patient is medically stable for D/C to [facility] today. Per Raquel admissions coordinator at [facility] patient is going to a room on the 100 hall. Clinical Education officer, museum (CSW) sent Raquel D/C summary via carefinder. CSW prepared D/C packet and arranged non-emergency EMS ([facility]) for transport. Patient reported that he would let his family know he is going to [facility] today. Nursing is aware of above. Please reconsult if further social work needs arise. CSW signing off.   Blima Rich, [place] Weekend CSW [phone]

## 2013-08-25 NOTE — Progress Notes (Signed)
Report phoned to [facility]. Malachy Mood

## 2013-08-28 LAB — CULTURE, BLOOD (ROUTINE X 2)
Culture: NO GROWTH
Culture: NO GROWTH

## 2013-10-11 ENCOUNTER — Ambulatory Visit (INDEPENDENT_AMBULATORY_CARE_PROVIDER_SITE_OTHER): Payer: BC Managed Care – PPO | Admitting: Endocrinology

## 2013-10-11 ENCOUNTER — Encounter: Payer: Self-pay | Admitting: Endocrinology

## 2013-10-11 VITALS — BP 124/66 | HR 89 | Temp 98.3°F | Resp 14 | Ht 69.0 in | Wt 171.6 lb

## 2013-10-11 DIAGNOSIS — D539 Nutritional anemia, unspecified: Secondary | ICD-10-CM

## 2013-10-11 DIAGNOSIS — N289 Disorder of kidney and ureter, unspecified: Secondary | ICD-10-CM

## 2013-10-11 DIAGNOSIS — E1165 Type 2 diabetes mellitus with hyperglycemia: Principal | ICD-10-CM

## 2013-10-11 DIAGNOSIS — IMO0001 Reserved for inherently not codable concepts without codable children: Secondary | ICD-10-CM

## 2013-10-11 DIAGNOSIS — I10 Essential (primary) hypertension: Secondary | ICD-10-CM

## 2013-10-11 LAB — URINALYSIS, ROUTINE W REFLEX MICROSCOPIC
Bilirubin Urine: NEGATIVE
Hgb urine dipstick: NEGATIVE
KETONES UR: NEGATIVE
Leukocytes, UA: NEGATIVE
Nitrite: NEGATIVE
PH: 5.5 (ref 5.0–8.0)
RBC / HPF: NONE SEEN (ref 0–?)
SPECIFIC GRAVITY, URINE: 1.025 (ref 1.000–1.030)
Total Protein, Urine: NEGATIVE
URINE GLUCOSE: NEGATIVE
Urobilinogen, UA: 0.2 (ref 0.0–1.0)

## 2013-10-11 LAB — MICROALBUMIN / CREATININE URINE RATIO
Creatinine,U: 73 mg/dL
Microalb Creat Ratio: 3.3 mg/g (ref 0.0–30.0)
Microalb, Ur: 2.4 mg/dL — ABNORMAL HIGH (ref 0.0–1.9)

## 2013-10-11 NOTE — Progress Notes (Signed)
Patient ID: Jason Mcpherson, male   DOB: 1940-08-21, 73 y.o.   MRN: [id number]    Reason for Appointment: Consultation for Type 2 Diabetes  History of Present Illness:          Diagnosis: Type 2 diabetes mellitus, date of diagnosis: 1997       Past history: The patient is unclear about his symptoms at diagnosis as well as initial treatment. Most likely has been on metformin for sometime and at some point was also given glipizide. He is also very unclear about when he was started on insulin and records from his previous endocrinologist are not available; may have been taking this over the last 15 years. Also not clear if he had been on different types of insulin However he was previously significantly overweight  Recent history:  He came in today because of concern about his overall blood sugar control and he was told that his glucose is worsening His A1c in 9/14 was 5.9 and this year has been 6.9-7.0 He is taking maximum dose metformin along with low-dose glipizide ER He is supposed to take Levemir insulin at bedtime but he is only taking it if his bedtime reading is above about 150 and is not having clear understanding of when to take it Currently checking blood sugars only before breakfast and supper Hypoglycemia: Rarely, has only one recent reading of 61           Oral hypoglycemic drugs the patient is taking are: Metformin, glipizide ER      Side effects from medications have been: None INSULIN regimen is described as:  0-16 Levemir hs   Glucose monitoring:  done one time a day         Glucometer: One Touch.      Blood Glucose readings from meter download:  PREMEAL Breakfast  1 PM   6-8 PM  Bedtime  Overall   Glucose range:  61-127   221   78-235     Median:  97    117       Glycemic control:  Lab Results  Component Value Date   HGBA1C 6.7* 08/17/2013   Lab Results  Component Value Date   CREATININE 1.28 08/24/2013    Self-care: The diet that the patient has  been following is: tries to limit fats  He is usually eating cereal at breakfast or sometimes peanut butter sandwich. Usually eating a sandwich at lunch and meat and salad with beans at dinner. Have snacks with Jell-O. Eating out about twice a week     Meals: 3 meals per day.          Exercise: walks in his driveway up to [id number] min         Dietician visit: Most recent:yrs               Compliance with the medical regimen:  Retinal exam: Most recent:2 years ago.    Weight history: Highest weight in the past has been 220 and lowest 157  Wt Readings from Last 3 Encounters:  10/11/13 171 lb 9.6 oz (77.837 kg)  08/17/13 170 lb (77.111 kg)  05/21/13 172 lb (78.019 kg)      Medication List       This list is accurate as of: 10/11/13  9:08 AM.  Always use your most recent med list.               aspirin 81 MG chewable tablet  Chew  81 mg by mouth daily.     EYE DROPS OP  Place 1 drop into both eyes 2 (two) times daily as needed (dry eyes).     ferrous sulfate 325 (65 FE) MG tablet  Take 325 mg by mouth daily with breakfast.     glipiZIDE 5 MG 24 hr tablet  Commonly known as:  GLUCOTROL XL     HYDROcodone-acetaminophen 5-325 MG per tablet  Commonly known as:  NORCO/VICODIN  Take 1-2 tablets by mouth every 4 (four) hours as needed for moderate pain.     insulin aspart 100 UNIT/ML injection  Commonly known as:  novoLOG  Inject 0-9 Units into the skin 3 (three) times daily with meals.     insulin detemir 100 UNIT/ML injection  Commonly known as:  LEVEMIR  Inject 16 Units into the skin at bedtime. If sugar is low patient states he does not take this.     losartan 100 MG tablet  Commonly known as:  COZAAR     metFORMIN 1000 MG tablet  Commonly known as:  GLUCOPHAGE     ONE TOUCH ULTRA TEST test strip  Generic drug:  glucose blood  1 each by Other route 2 (two) times daily. Use as instructed     ONETOUCH DELICA LANCETS FINE Misc  by Does not apply route 2 (two) times  daily.     simvastatin 20 MG tablet  Commonly known as:  ZOCOR  Take 20 mg by mouth every evening.        Allergies: No Known Allergies  Past Medical History  Diagnosis Date  . Diabetes mellitus without complication   . Hyperlipemia   . Cellulitis 08/2013    RT LEG  . Dehydration   . Hyperkalemia 08/2013  . Chronic kidney disease 08/2013    ACUTE RENAL   . Anemia   . Hypertension   . Arthritis     KNEES & BACK    Past Surgical History  Procedure Laterality Date  . Back surgery      10 years ago  . Anterior cervical decomp/discectomy fusion N/A 10/17/2012    Procedure: ANTERIOR CERVICAL DECOMPRESSION/DISCECTOMY FUSION 2 LEVELS;  Surgeon: Charlie Pitter, MD;  Location: [facility];  Service: Neurosurgery;  Laterality: N/A;  Cervical three-four, four-five anterior cervical decompression fusion with allograft plating  . Cervical disc surgery      Family History  Problem Relation Age of Onset  . Cancer - Colon Mother   . Cancer - Prostate Father     Social History:  reports that he quit smoking about 16 years ago. He has never used smokeless tobacco. He reports that he does not drink alcohol or use illicit drugs.    Review of Systems       Lipids: Last LDL 58, HDL 66 and treated with Zocor 20 mg       No results found for this basename: CHOL, HDL, LDLCALC, LDLDIRECT, TRIG, CHOLHDL        No recent headaches.                  Skin: No rash. He had cellulitis in 4/15 on his right leg and was admitted for this along with systemic symptoms      Thyroid:  Does have easy fatigability      The blood pressure has been controlled with losartan only      No swelling of feet.     No shortness of breath or chest tightness  on exertion.     Bowel habits: Normal.       No frequency of urination or nocturia       No joint  pains.      No  depression      Balance         No history of Numbness, tingling or burning in feet     LABS:  No visits with results within 1  Week(s) from this visit. Latest known visit with results is:  Admission on 08/17/2013, Discharged on 08/25/2013  No results displayed because visit has over 200 results.      Physical Examination:  BP 124/66  Pulse 89  Temp(Src) 98.3 F (36.8 C)  Resp 14  Ht 5\' 9"  (1.753 m)  Wt 171 lb 9.6 oz (77.837 kg)  BMI 25.33 kg/m2  SpO2 99%  GENERAL:         Patient has generalized obesity.   HEENT:         Eye exam shows normal external appearance. Fundus exam shows no retinopathy. Oral exam shows normal mucosa .  NECK:         General:  Neck exam shows no lymphadenopathy. Carotids are normal to palpation and no bruit heard. Thyroid is not enlarged and no nodules felt.   LUNGS:         Chest is symmetrical. Lungs are clear to auscultation.Marland Kitchen   HEART:         Heart sounds:  S1 and S2 are normal. No murmurs or clicks heard., no S3 or S4.   ABDOMEN:   There is no distention present. Liver and spleen are not palpable. No other mass or tenderness present.  EXTREMITIES:     There is no pitting edema. No skin lesions present.Marland Kitchen  NEUROLOGICAL:   Vibration sense is absent  in toes. Ankle jerks are absent bilaterally.biceps reflexes are brisk           Diabetic foot exam:  monofilament sensation absent in the distal left toes but present on the right. Present on the plantar surface of the left. MUSCULOSKELETAL:       There is no enlargement or deformity of the joints. Spine is normal to inspection.Marland Kitchen   SKIN:  mild erythema and relative warmth of the right lower leg without tenderness, skin appears relatively tight but no pitting edema      ASSESSMENT:  Diabetes type 2, normal BMI His blood sugars are reasonably well-controlled for his age, duration of diabetes and general medical condition His blood sugar is averaging only 115 at home although he has not done blood sugars after breakfast or lunch except rarely. Not clear if insulin is beneficial as his blood sugars are nearly the same whether he takes  the insulin or not at bedtime and has had readings as low as 61 fasting  Complications: Peripheral neuropathy causing large fiber involvement and loss of position sense, this is likely to be causing his gait imbalance Unknown status of retinopathy and nephropathy  Renal insufficiency: This appears to be related to his hospitalization for gastroenteritis and cellulitis Recent GFR is 48. This appears to be relatively new and persistent since his hospitalization He still continues to be on 100 mg of losartan  Hypertension: His blood pressure is low normal considering his age  Marked anemia of unclear etiology. Since he has been on metformin long-term will need to check a B12 level  PLAN:   Observe blood sugars without insulin  Reduce evening metformin to  half a tablet because of renal insufficiency  Start monitoring more readings after breakfast and lunch and less frequently in the morning. Discussed blood sugar targets and normals  Consider dietitian consultation especially if having significant postprandial hyperglycemia. Needs balanced meals with protein at each meal, discussed  May also consider adding Januvia if he is having consistent postprandial hyperglycemia  Reduce losartan to 50 mg  Have sent a requisition to his PCP to get B12 drawn  He will followup with his PCP for anemia and renal insufficiency  Counseling time over 50% of today's 60 minute visit     Patient Instructions  Stop Levemir insulin  Please check blood sugars at least half the time about 2 hours after any meal and 3x per week on waking up. Please bring blood sugar monitor to each visit  Metformin 1 in and 1/2 in pm  Reduce Losartan to 1/2 daily     Keundra Petrucelli 10/11/2013, 9:08 AM   Note: This office note was prepared with Estate agent. Any transcriptional errors that result from this process are unintentional.

## 2013-10-11 NOTE — Patient Instructions (Addendum)
Stop Levemir insulin  Please check blood sugars at least half the time about 2 hours after any meal and 3x per week on waking up. Please bring blood sugar monitor to each visit  Metformin 1 in and 1/2 in pm  Reduce Losartan to 1/2 daily

## 2013-10-12 DIAGNOSIS — I10 Essential (primary) hypertension: Secondary | ICD-10-CM | POA: Insufficient documentation

## 2013-10-12 DIAGNOSIS — E1165 Type 2 diabetes mellitus with hyperglycemia: Principal | ICD-10-CM

## 2013-10-12 DIAGNOSIS — IMO0001 Reserved for inherently not codable concepts without codable children: Secondary | ICD-10-CM | POA: Insufficient documentation

## 2013-10-29 ENCOUNTER — Ambulatory Visit (INDEPENDENT_AMBULATORY_CARE_PROVIDER_SITE_OTHER): Payer: BC Managed Care – PPO | Admitting: Endocrinology

## 2013-10-29 ENCOUNTER — Other Ambulatory Visit: Payer: Self-pay | Admitting: *Deleted

## 2013-10-29 ENCOUNTER — Encounter: Payer: Self-pay | Admitting: Endocrinology

## 2013-10-29 ENCOUNTER — Other Ambulatory Visit (INDEPENDENT_AMBULATORY_CARE_PROVIDER_SITE_OTHER): Payer: BC Managed Care – PPO

## 2013-10-29 VITALS — BP 130/78 | HR 91 | Temp 98.2°F | Resp 14 | Ht 69.0 in | Wt 172.0 lb

## 2013-10-29 DIAGNOSIS — E1165 Type 2 diabetes mellitus with hyperglycemia: Principal | ICD-10-CM

## 2013-10-29 DIAGNOSIS — N289 Disorder of kidney and ureter, unspecified: Secondary | ICD-10-CM

## 2013-10-29 DIAGNOSIS — IMO0001 Reserved for inherently not codable concepts without codable children: Secondary | ICD-10-CM

## 2013-10-29 DIAGNOSIS — I10 Essential (primary) hypertension: Secondary | ICD-10-CM

## 2013-10-29 LAB — COMPREHENSIVE METABOLIC PANEL
ALBUMIN: 4.1 g/dL (ref 3.5–5.2)
ALT: 11 U/L (ref 0–53)
AST: 17 U/L (ref 0–37)
Alkaline Phosphatase: 64 U/L (ref 39–117)
BUN: 27 mg/dL — ABNORMAL HIGH (ref 6–23)
CALCIUM: 9.8 mg/dL (ref 8.4–10.5)
CHLORIDE: 108 meq/L (ref 96–112)
CO2: 25 mEq/L (ref 19–32)
CREATININE: 1.2 mg/dL (ref 0.4–1.5)
GFR: 62.26 mL/min (ref >60.00)
Glucose, Bld: 132 mg/dL — ABNORMAL HIGH (ref 70–99)
POTASSIUM: 4.4 meq/L (ref 3.5–5.1)
Sodium: 140 mEq/L (ref 135–145)
Total Bilirubin: 0.5 mg/dL (ref 0.2–1.2)
Total Protein: 7.4 g/dL (ref 6.0–8.3)

## 2013-10-29 MED ORDER — SITAGLIPTIN PHOSPHATE 100 MG PO TABS: 100.0000 mg | ORAL_TABLET | Freq: Every day | ORAL | Status: DC

## 2013-10-29 MED ORDER — SITAGLIPTIN PHOSPHATE 100 MG PO TABS
100.0000 mg | ORAL_TABLET | Freq: Every day | ORAL | Status: DC
Start: 2013-10-29 — End: 2013-12-17

## 2013-10-29 MED ORDER — METFORMIN HCL 1000 MG PO TABS: 1000.0000 mg | ORAL_TABLET | Freq: Two times a day (BID) | ORAL | Status: DC

## 2013-10-29 NOTE — Patient Instructions (Signed)
Januvia 1/2 daily  Please check blood sugars at least half the time about 2 hours after any meal and 3 times per week on waking up . Please bring blood sugar monitor to each visit  Cut down on portions of sweets and  Carbs

## 2013-10-29 NOTE — Progress Notes (Signed)
Patient ID: Jason Mcpherson, male   DOB: Jul 24, 1940, 73 y.o.   MRN: [id number]    Reason for Appointment: Followup for Type 2 Diabetes  History of Present Illness:          Diagnosis: Type 2 diabetes mellitus, date of diagnosis: 1997       Past history: The patient is unclear about his symptoms at diagnosis as well as initial treatment. Most likely has been on metformin for sometime and at some point was also given glipizide. He is also very unclear about when he was started on insulin and records from his previous endocrinologist are not available; may have been taking this over the last 15 years. Also not clear if he had been on different types of insulin However he was previously significantly overweight  Recent history:  His A1c in 9/14 was 5.9 and this year has been 6.9-7.0 He is taking maximum dose metformin along with low-dose glipizide ER Since he was taking Levemir irregularly this was stopped on his initial consultation in 6/15 He was mostly taking Levemir when blood  sugar would be higher at night His blood sugars are overall relatively higher and mostly after meals Apparently his blood sugars fluctuate based on what he is eating and can be over 200 if he is eating sweets like ice cream He has been checking his blood sugars more consistently about 3 times a day including about 2 hours after lunch or dinner Hypoglycemia: None           Oral hypoglycemic drugs the patient is taking are: Metformin, glipizide ER      Side effects from medications have been: None INSULIN regimen is described as: None  Glucose monitoring:  done 2.9 times in a day         Glucometer: One Touch.      Blood Glucose readings from meter download:  PREMEAL Breakfast Lunch Dinner Bedtime Overall  Glucose range:  124-167       Median  150      152   POST-MEAL PC Breakfast PC Lunch PC Dinner  Glucose range:   112-222   97-255  Mean/median:      Glycemic control:  Lab Results    Component Value Date   HGBA1C 6.7* 08/17/2013   Lab Results  Component Value Date   MICROALBUR 2.4* 10/11/2013   CREATININE 1.28 08/24/2013    Self-care: The diet that the patient has been following is: tries to limit fats  He is usually eating cereal at breakfast or sometimes peanut butter sandwich. Usually eating a sandwich at lunch and meat and salad with beans at dinner. Have snacks with Jell-O. Eating out about twice a week     Meals: 3 meals per day.          Exercise: walks in his driveway up to [id number] min         Dietician visit: Most recent:yrs               Compliance with the medical regimen:  Retinal exam: Most recent:2 years ago.    Weight history: Highest weight in the past has been 220 and lowest 157  Wt Readings from Last 3 Encounters:  10/29/13 172 lb (78.019 kg)  10/11/13 171 lb 9.6 oz (77.837 kg)  08/17/13 170 lb (77.111 kg)      Medication List       This list is accurate as of: 10/29/13  9:09 AM.  Always use  your most recent med list.               aspirin 81 MG chewable tablet  Chew 81 mg by mouth daily.     EYE DROPS OP  Place 1 drop into both eyes 2 (two) times daily as needed (dry eyes).     ferrous sulfate 325 (65 FE) MG tablet  Take 325 mg by mouth daily with breakfast.     glipiZIDE 5 MG 24 hr tablet  Commonly known as:  GLUCOTROL XL     HYDROcodone-acetaminophen 5-325 MG per tablet  Commonly known as:  NORCO/VICODIN  Take 1-2 tablets by mouth every 4 (four) hours as needed for moderate pain.     insulin aspart 100 UNIT/ML injection  Commonly known as:  novoLOG  Inject 0-9 Units into the skin 3 (three) times daily with meals.     insulin detemir 100 UNIT/ML injection  Commonly known as:  LEVEMIR  Inject 16 Units into the skin at bedtime. If sugar is low patient states he does not take this.     losartan 100 MG tablet  Commonly known as:  COZAAR     metFORMIN 1000 MG tablet  Commonly known as:  GLUCOPHAGE     ONE TOUCH ULTRA  TEST test strip  Generic drug:  glucose blood  1 each by Other route 2 (two) times daily. Use as instructed     ONETOUCH DELICA LANCETS FINE Misc  by Does not apply route 2 (two) times daily.     simvastatin 20 MG tablet  Commonly known as:  ZOCOR  Take 20 mg by mouth every evening.        Allergies: No Known Allergies  Past Medical History  Diagnosis Date  . Diabetes mellitus without complication   . Hyperlipemia   . Cellulitis 08/2013    RT LEG  . Dehydration   . Hyperkalemia 08/2013  . Chronic kidney disease 08/2013    ACUTE RENAL   . Anemia   . Hypertension   . Arthritis     KNEES & BACK    Past Surgical History  Procedure Laterality Date  . Back surgery      10 years ago  . Anterior cervical decomp/discectomy fusion N/A 10/17/2012    Procedure: ANTERIOR CERVICAL DECOMPRESSION/DISCECTOMY FUSION 2 LEVELS;  Surgeon: Charlie Pitter, MD;  Location: [facility];  Service: Neurosurgery;  Laterality: N/A;  Cervical three-four, four-five anterior cervical decompression fusion with allograft plating  . Cervical disc surgery      Family History  Problem Relation Age of Onset  . Cancer - Colon Mother   . Cancer - Prostate Father     Social History:  reports that he quit smoking about 16 years ago. He has never used smokeless tobacco. He reports that he does not drink alcohol or use illicit drugs.    Review of Systems       Lipids: Last LDL 58, HDL 66 and treated with Zocor 20 mg       No results found for this basename: CHOL,  HDL,  LDLCALC,  LDLDIRECT,  TRIG,  CHOLHDL     He has had significant anemia, not clear if the etiology has been found, patient has no knowledge. Vitamin B 12 level was requested to PCP, report not available   LABS:  No visits with results within 1 Week(s) from this visit. Latest known visit with results is:  Office Visit on 10/11/2013  Component Date Value Ref  Range Status  . Microalb, Ur 10/11/2013 2.4* 0.0 - 1.9 mg/dL Final  .  Creatinine,U 10/11/2013 73.0   Final  . Microalb Creat Ratio 10/11/2013 3.3  0.0 - 30.0 mg/g Final  . Color, Urine 10/11/2013 YELLOW  Yellow;Lt. Yellow Final  . APPearance 10/11/2013 CLEAR  Clear Final  . Specific Gravity, Urine 10/11/2013 1.025  1.000-1.030 Final  . pH 10/11/2013 5.5  5.0 - 8.0 Final  . Total Protein, Urine 10/11/2013 NEGATIVE  Negative Final  . Urine Glucose 10/11/2013 NEGATIVE  Negative Final  . Ketones, ur 10/11/2013 NEGATIVE  Negative Final  . Bilirubin Urine 10/11/2013 NEGATIVE  Negative Final  . Hgb urine dipstick 10/11/2013 NEGATIVE  Negative Final  . Urobilinogen, UA 10/11/2013 0.2  0.0 - 1.0 Final  . Leukocytes, UA 10/11/2013 NEGATIVE  Negative Final  . Nitrite 10/11/2013 NEGATIVE  Negative Final  . WBC, UA 10/11/2013 0-2/hpf  0-2/hpf Final  . RBC / HPF 10/11/2013 none seen  0-2/hpf Final    Physical Examination:  BP 130/78  Pulse 91  Temp(Src) 98.2 F (36.8 C)  Resp 14  Ht 5\' 9"  (1.753 m)  Wt 172 lb (78.019 kg)  BMI 25.39 kg/m2  SpO2 94%   Not indicated  ASSESSMENT:  Diabetes type 2, normal BMI His blood sugars are  overall higher than before and this may be partly from his not taking his basal insulin even though it was irregularly Also his metformin was reduced slightly because of renal dysfunction Also diet appears to be inconsistent and he is sometimes getting more carbohydrates or simple sugars at meals Blood sugars are relatively higher after meals although sometimes they are normal after supper  Renal insufficiency: This will be assessed today. His losartan was reduced to 50 mg on his last visit because of low normal blood pressure and relatively high creatinine  Hypertension: His blood pressure is  controlled with 50 mg losartan and will continue   PLAN:    trial of Januvia, may start with 50 mg especially if his creatinine clearance is below 50. Have given him brochure on the medication, type 2 diabetes as well as information on  patient assistance program if he needs it, discussed how Januvia works and benefits  May need to adjust dose of metformin based on his renal function  Discussed possibly reducing his glipizide if blood sugars are significantly lower   Reduce portions of sweets at mealtimes  He agrees to see the dietitian on the next visit for consultation for more formal meal planning instructions  He will followup with his PCP for anemia and renal insufficiency  Counseling time over 50% of today's 25 minute visit    KUMAR,AJAY 10/29/2013, 9:09 AM   Note: This office note was prepared with Dragon voice recognition system technology. Any transcriptional errors that result from this process are unintentional.  Addendum: Creatinine is normal, he will will take 100 mg of Januvia and maximum dose of metformin

## 2013-10-31 LAB — FRUCTOSAMINE: FRUCTOSAMINE: 281 umol/L — AB (ref 190–270)

## 2013-11-20 ENCOUNTER — Ambulatory Visit (INDEPENDENT_AMBULATORY_CARE_PROVIDER_SITE_OTHER): Payer: BC Managed Care – PPO | Admitting: Endocrinology

## 2013-11-20 ENCOUNTER — Encounter: Payer: Self-pay | Admitting: Endocrinology

## 2013-11-20 VITALS — BP 126/68 | HR 92 | Temp 97.6°F | Resp 12 | Wt 167.2 lb

## 2013-11-20 DIAGNOSIS — E1165 Type 2 diabetes mellitus with hyperglycemia: Principal | ICD-10-CM

## 2013-11-20 DIAGNOSIS — I10 Essential (primary) hypertension: Secondary | ICD-10-CM

## 2013-11-20 DIAGNOSIS — IMO0001 Reserved for inherently not codable concepts without codable children: Secondary | ICD-10-CM

## 2013-11-20 NOTE — Patient Instructions (Signed)
Same meds  Alternate am and 2 hr after any any meal

## 2013-11-20 NOTE — Progress Notes (Signed)
Patient ID: Jason Mcpherson, male   DOB: 07-27-1940, 73 y.o.   MRN: [id number]    Reason for Appointment: Followup for Type 2 Diabetes  History of Present Illness:          Diagnosis: Type 2 diabetes mellitus, date of diagnosis: 1997       Past history: The patient is unclear about his symptoms at diagnosis as well as initial treatment. Most likely has been on metformin for sometime and at some point was also given glipizide. He is also very unclear about when he was started on insulin and records from his previous endocrinologist are not available; may have been taking this over the last 15 years. Also not clear if he had been on different types of insulin However he was previously significantly overweight His A1c in 9/14 was 5.9 and this year has been 6.9-7.0  Recent history:  On his initial consultation he was taking metformin and glipizide ER; also he was taking Levemir irregularly based on his blood sugar level Levemir was stopped on his initial consultation in 6/15 Without taking Levemir his blood sugars were modestly increased but fluctuating significantly with readings as high as 255 He was started on Januvia 50 mg daily on 10/29/13 and glipizide ER continued His metformin was reduced because of renal dysfunction With this regimen his blood sugars are excellent now and not fluctuating Also no hypoglycemia with current regimen of glipizide He was eating sweets like ice cream and has stopped doing this as much Also weight has improved He has been checking his blood sugars more consistently about 2 times a day including about 2 hours after  dinner Hypoglycemia: None           Oral hypoglycemic drugs the patient is taking are: Metformin, glipizide ER, Januvia 50 mg      Side effects from medications have been: None INSULIN regimen is described as: None  Glucose monitoring:  done 2.2 times in a day         Glucometer: One Touch.      Blood Glucose readings from meter  download recently:  PREMEAL Breakfast Lunch  PCS  Bedtime Overall  Glucose range:  106-137    82-147     Mean/median:  127      127    Glycemic control:  Lab Results  Component Value Date   HGBA1C 6.7* 08/17/2013   Lab Results  Component Value Date   MICROALBUR 2.4* 10/11/2013   CREATININE 1.2 10/29/2013    Self-care: The diet that the patient has been following is: tries to limit fats and sweets  He is usually eating cereal at breakfast or sometimes peanut butter sandwich. Usually eating a sandwich at lunch and meat and salad with beans at dinner. Have snacks with Jell-O. Eating out about twice a week     Meals: 3 meals per day.          Exercise: walks in his driveway up to [id number] min         Dietician visit:  unknown. Does have a diet plan at home              Retinal exam: Most recent:2 years ago.    Weight history: Highest weight in the past has been 220 and lowest 157  Wt Readings from Last 3 Encounters:  11/20/13 167 lb 3.2 oz (75.841 kg)  10/29/13 172 lb (78.019 kg)  10/11/13 171 lb 9.6 oz (77.837 kg)  Medication List       This list is accurate as of: 11/20/13 10:45 AM.  Always use your most recent med list.               aspirin 81 MG chewable tablet  Chew 81 mg by mouth daily.     ferrous sulfate 325 (65 FE) MG tablet  Take 325 mg by mouth daily with breakfast.     glipiZIDE 5 MG 24 hr tablet  Commonly known as:  GLUCOTROL XL     losartan 100 MG tablet  Commonly known as:  COZAAR     metFORMIN 1000 MG tablet  Commonly known as:  GLUCOPHAGE  Take 1 tablet (1,000 mg total) by mouth 2 (two) times daily with a meal.     ONE TOUCH ULTRA TEST test strip  Generic drug:  glucose blood  1 each by Other route 2 (two) times daily. Use as instructed     ONETOUCH DELICA LANCETS FINE Misc  by Does not apply route 2 (two) times daily.     simvastatin 20 MG tablet  Commonly known as:  ZOCOR  Take 20 mg by mouth every evening.     sitaGLIPtin 100 MG  tablet  Commonly known as:  JANUVIA  Take 1 tablet (100 mg total) by mouth daily.        Allergies: No Known Allergies  Past Medical History  Diagnosis Date  . Diabetes mellitus without complication   . Hyperlipemia   . Cellulitis 08/2013    RT LEG  . Dehydration   . Hyperkalemia 08/2013  . Chronic kidney disease 08/2013    ACUTE RENAL   . Anemia   . Hypertension   . Arthritis     KNEES & BACK    Past Surgical History  Procedure Laterality Date  . Back surgery      10 years ago  . Anterior cervical decomp/discectomy fusion N/A 10/17/2012    Procedure: ANTERIOR CERVICAL DECOMPRESSION/DISCECTOMY FUSION 2 LEVELS;  Surgeon: Charlie Pitter, MD;  Location: [facility];  Service: Neurosurgery;  Laterality: N/A;  Cervical three-four, four-five anterior cervical decompression fusion with allograft plating  . Cervical disc surgery      Family History  Problem Relation Age of Onset  . Cancer - Colon Mother   . Cancer - Prostate Father     Social History:  reports that he quit smoking about 16 years ago. He has never used smokeless tobacco. He reports that he does not drink alcohol or use illicit drugs.    Review of Systems       Lipids: Last LDL 58, HDL 66 and treated with Zocor 20 mg       No results found for this basename: CHOL,  HDL,  LDLCALC,  LDLDIRECT,  TRIG,  CHOLHDL     He has had significant anemia, not clear if the etiology has been found Is going to have a colonoscopy  LABS:  No visits with results within 1 Week(s) from this visit. Latest known visit with results is:  Appointment on 10/29/2013  Component Date Value Ref Range Status  . Fructosamine 10/29/2013 281* 190 - 270 umol/L Final  . Sodium 10/29/2013 140  135 - 145 mEq/L Final  . Potassium 10/29/2013 4.4  3.5 - 5.1 mEq/L Final  . Chloride 10/29/2013 108  96 - 112 mEq/L Final  . CO2 10/29/2013 25  19 - 32 mEq/L Final  . Glucose, Bld 10/29/2013 132* 70 - 99 mg/dL  Final  . BUN 10/29/2013 27* 6 - 23  mg/dL Final  . Creatinine, Ser 10/29/2013 1.2  0.4 - 1.5 mg/dL Final  . Total Bilirubin 10/29/2013 0.5  0.2 - 1.2 mg/dL Final  . Alkaline Phosphatase 10/29/2013 64  39 - 117 U/L Final  . AST 10/29/2013 17  0 - 37 U/L Final  . ALT 10/29/2013 11  0 - 53 U/L Final  . Total Protein 10/29/2013 7.4  6.0 - 8.3 g/dL Final  . Albumin 10/29/2013 4.1  3.5 - 5.2 g/dL Final  . Calcium 10/29/2013 9.8  8.4 - 10.5 mg/dL Final  . GFR 10/29/2013 62.26  >60.00 mL/min Final    Physical Examination:  BP 126/68  Pulse 92  Temp(Src) 97.6 F (36.4 C) (Oral)  Resp 12  Wt 167 lb 3.2 oz (75.841 kg)  SpO2 97%   Not indicated  ASSESSMENT:  Diabetes type 2, normal BMI His blood sugars are  overall much better with adding Januvia to his regimen of metformin and glipizide He is not taking the full dose of Januvia as directed and seems to be benefiting even at 50 mg dosage Blood sugars are relatively higher after in the morning but still close to normal Also his metformin was reduced slightly because of renal dysfunction and he is still taking 1500 mg a day Recently his diet appears to be significantly better and he has lost some weight  Renal insufficiency: This had resolved as of 6/15  Hypertension: His blood pressure is  controlled with 50 mg losartan and will continue   PLAN:  He will continue his 3 drug regimen of Januvia 50 mg, metformin 1500 mg daily and glipizide ER 5 mg He can reduce glucose monitoring to once a day at various times as discussed To call if he has any change in his blood sugar control   Laurabelle Gorczyca 11/20/2013, 10:45 AM   Note: This office note was prepared with Estate agent. Any transcriptional errors that result from this process are unintentional.

## 2013-12-17 ENCOUNTER — Other Ambulatory Visit: Payer: Self-pay | Admitting: *Deleted

## 2013-12-17 MED ORDER — SITAGLIPTIN PHOSPHATE 100 MG PO TABS: 100.0000 mg | ORAL_TABLET | Freq: Every day | ORAL | Status: DC

## 2014-02-18 ENCOUNTER — Ambulatory Visit: Payer: BC Managed Care – PPO | Admitting: Endocrinology

## 2014-02-21 ENCOUNTER — Ambulatory Visit (INDEPENDENT_AMBULATORY_CARE_PROVIDER_SITE_OTHER): Payer: BC Managed Care – PPO | Admitting: Endocrinology

## 2014-02-21 ENCOUNTER — Encounter: Payer: Self-pay | Admitting: Endocrinology

## 2014-02-21 VITALS — BP 131/64 | HR 81 | Temp 97.9°F | Resp 14 | Ht 69.0 in | Wt 177.2 lb

## 2014-02-21 DIAGNOSIS — I10 Essential (primary) hypertension: Secondary | ICD-10-CM

## 2014-02-21 DIAGNOSIS — E1165 Type 2 diabetes mellitus with hyperglycemia: Secondary | ICD-10-CM

## 2014-02-21 DIAGNOSIS — IMO0002 Reserved for concepts with insufficient information to code with codable children: Secondary | ICD-10-CM

## 2014-02-21 LAB — HEMOGLOBIN A1C: Hgb A1c MFr Bld: 5.7 % (ref 4.6–6.5)

## 2014-02-21 MED ORDER — GLIPIZIDE ER 2.5 MG PO TB24: 2.5000 mg | ORAL_TABLET | Freq: Every day | ORAL | Status: DC

## 2014-02-21 MED ORDER — FUROSEMIDE 20 MG PO TABS: 20.0000 mg | ORAL_TABLET | Freq: Every day | ORAL | Status: DC

## 2014-02-21 NOTE — Progress Notes (Signed)
Patient ID: Jason Mcpherson, male   DOB: May 29, 1940, 73 y.o.   MRN: [id number]    Reason for Appointment: Followup for Type 2 Diabetes  History of Present Illness:          Diagnosis: Type 2 diabetes mellitus, date of diagnosis: 1997       Past history: The patient is unclear about his symptoms at diagnosis as well as initial treatment. Most likely has been on metformin for sometime and at some point was also given glipizide. He is also very unclear about when he was started on insulin and records from his previous endocrinologist are not available; may have been taking this over the last 15 years. Also not clear if he had been on different types of insulin However he was previously significantly overweight His A1c in 9/14 was 5.9 and this year has been 6.9-7.0 On his initial consultation he was taking metformin and glipizide ER; also he was taking Levemir irregularly based on his blood sugar level Levemir was stopped on his initial consultation in 6/15  Recent history:   He has been on Januvia 50 mg daily since 10/29/13 after stopping his Levemir insulin   His metformin was reduced to 1500 mg because of renal dysfunction He is on a regimen of glipizide ER 5 mg daily also  With this regimen his blood sugars are excellent now and readings after dinner are averaging only 106 He has not checked his blood sugars at any other time even though he was doing some previously This is because he is starting to work now He has gained a little weight but does have some edema also. He thinks he and his wife are trying to watch his diet better  Hypoglycemia: Occasionally may feel a little weak before lunch, has not checked his blood sugar at this time         Oral hypoglycemic drugs the patient is taking are: Metformin, glipizide ER, Januvia 50 mg      Side effects from medications have been: None INSULIN regimen is described as: None  Glucose monitoring:  done 2.2 times in a day          Glucometer: One Touch.      Blood Glucose readings from meter download recently:  Range 81-132, 2 hours after dinner with median 105  Glycemic control:  Lab Results  Component Value Date   HGBA1C 6.7* 08/17/2013   Lab Results  Component Value Date   MICROALBUR 2.4* 10/11/2013   CREATININE 1.2 10/29/2013    Self-care: The diet that the patient has been following is: tries to limit fats and sweets  He is usually eating cereal at breakfast or sometimes peanut butter sandwich. Usually eating a sandwich at lunch and meat and salad with beans at dinner. Have snacks with Jell-O. Eating out about twice a week     Meals: 3 meals per day.          Exercise: walks in his driveway up to [id number] min         Dietician visit:  unknown. Does have a diet plan at home              Retinal exam: Most recent:2 years ago.    Weight history: Highest weight in the past has been 220 and lowest 157  Wt Readings from Last 3 Encounters:  02/21/14 177 lb 3.2 oz (80.377 kg)  11/20/13 167 lb 3.2 oz (75.841 kg)  10/29/13 172 lb (78.019  kg)      Medication List       This list is accurate as of: 02/21/14  3:15 PM.  Always use your most recent med list.               aspirin 81 MG chewable tablet  Chew 81 mg by mouth daily.     glipiZIDE 5 MG 24 hr tablet  Commonly known as:  GLUCOTROL XL     losartan 100 MG tablet  Commonly known as:  COZAAR     metFORMIN 1000 MG tablet  Commonly known as:  GLUCOPHAGE  Take 1 tablet (1,000 mg total) by mouth 2 (two) times daily with a meal.     ONE TOUCH ULTRA TEST test strip  Generic drug:  glucose blood  1 each by Other route 2 (two) times daily. Use as instructed     ONETOUCH DELICA LANCETS FINE Misc  by Does not apply route 2 (two) times daily.     simvastatin 20 MG tablet  Commonly known as:  ZOCOR  Take 20 mg by mouth every evening.     sitaGLIPtin 100 MG tablet  Commonly known as:  JANUVIA  Take 1 tablet (100 mg total) by mouth daily.          Allergies: No Known Allergies  Past Medical History  Diagnosis Date  . Diabetes mellitus without complication   . Hyperlipemia   . Cellulitis 08/2013    RT LEG  . Dehydration   . Hyperkalemia 08/2013  . Chronic kidney disease 08/2013    ACUTE RENAL   . Anemia   . Hypertension   . Arthritis     KNEES & BACK    Past Surgical History  Procedure Laterality Date  . Back surgery      10 years ago  . Anterior cervical decomp/discectomy fusion N/A 10/17/2012    Procedure: ANTERIOR CERVICAL DECOMPRESSION/DISCECTOMY FUSION 2 LEVELS;  Surgeon: Charlie Pitter, MD;  Location: [facility];  Service: Neurosurgery;  Laterality: N/A;  Cervical three-four, four-five anterior cervical decompression fusion with allograft plating  . Cervical disc surgery      Family History  Problem Relation Age of Onset  . Cancer - Colon Mother   . Cancer - Prostate Father     Social History:  reports that he quit smoking about 16 years ago. He has never used smokeless tobacco. He reports that he does not drink alcohol or use illicit drugs.    Review of Systems       Lipids: Last LDL 58, HDL 66 and treated with Zocor 20 mg       No results found for this basename: CHOL,  HDL,  LDLCALC,  LDLDIRECT,  TRIG,  CHOLHDL   He apparently had an ulcer on his endoscopy  He is having some swelling of his legs especially the right side. Does not like to wear the elastic stockings  LABS:  Pending  Physical Examination:  BP 131/64  Pulse 81  Temp(Src) 97.9 F (36.6 C)  Resp 14  Ht 5\' 9"  (1.753 m)  Wt 177 lb 3.2 oz (80.377 kg)  BMI 26.16 kg/m2  SpO2 98%  He has 2+ right and 1+ left lower leg edema No rash or cellulitis. Has a few reddish scab-like lesions on the right leg  ASSESSMENT:  Diabetes type 2, normal BMI His blood sugars are  overall much better since her last visit especially with his starting to be more active at work  Currently on 50 mg Januvia along with regimen of 1500 mg metformin and  glipizide ER 5 mg daily Although he does not report any hypoglycemia except occasionally at lunchtime he has lower readings in the evenings He is also fairly compliant with diet  Hypertension: His blood pressure is  controlled with 50 mg losartan   Edema of legs: This is partly related to venous insufficiency   PLAN:  He will reduce his glipizide ER to 2.5 mg  To check more readings in the mornings and after breakfast on weekends Check A1c today  He can take Lasix 20 mg about 3 times a week for edema     Yuritza Paulhus 02/21/2014, 3:15 PM   Note: This office note was prepared with Estate agent. Any transcriptional errors that result from this process are unintentional.

## 2014-02-21 NOTE — Patient Instructions (Signed)
Please check blood sugars at least half the time about 2 hours after any meal and 2 times per week on waking up. Please bring blood sugar monitor to each visit  Reduce Glipizide dose to 2.5mg  daily  Furosemide as needed for leg swelling

## 2014-02-22 LAB — COMPREHENSIVE METABOLIC PANEL
ALK PHOS: 66 U/L (ref 39–117)
ALT: 18 U/L (ref 0–53)
AST: 21 U/L (ref 0–37)
Albumin: 3.5 g/dL (ref 3.5–5.2)
BUN: 27 mg/dL — AB (ref 6–23)
CO2: 26 mEq/L (ref 19–32)
Calcium: 9.4 mg/dL (ref 8.4–10.5)
Chloride: 110 mEq/L (ref 96–112)
Creatinine, Ser: 1.3 mg/dL (ref 0.4–1.5)
GFR: 59.36 mL/min — ABNORMAL LOW (ref >60.00)
Glucose, Bld: 70 mg/dL (ref 70–99)
POTASSIUM: 4.3 meq/L (ref 3.5–5.1)
Sodium: 141 mEq/L (ref 135–145)
Total Bilirubin: 0.8 mg/dL (ref 0.2–1.2)
Total Protein: 6.9 g/dL (ref 6.0–8.3)

## 2014-02-22 LAB — LDL CHOLESTEROL, DIRECT: Direct LDL: 48.9 mg/dL

## 2014-02-22 NOTE — Progress Notes (Signed)
Quick Note:  Please let patient know that the diabetes test is excellent Kidney test is stable and cholesterol okay ______

## 2014-05-24 ENCOUNTER — Other Ambulatory Visit: Payer: BC Managed Care – PPO

## 2014-05-24 ENCOUNTER — Ambulatory Visit: Payer: BC Managed Care – PPO | Admitting: Endocrinology

## 2014-06-20 ENCOUNTER — Other Ambulatory Visit: Payer: Self-pay | Admitting: Endocrinology

## 2014-07-05 ENCOUNTER — Telehealth: Payer: Self-pay | Admitting: Endocrinology

## 2014-07-05 NOTE — Telephone Encounter (Signed)
Needs to find out if Onglyza or Jason Mcpherson are better covered

## 2014-07-05 NOTE — Telephone Encounter (Signed)
Please see below and advise.

## 2014-07-05 NOTE — Telephone Encounter (Signed)
Left detailed message on patients vm

## 2014-07-05 NOTE — Telephone Encounter (Signed)
Pt questions Jason Mcpherson it is costing $130. Is there an alternative he can take

## 2014-07-08 ENCOUNTER — Telehealth: Payer: Self-pay | Admitting: Endocrinology

## 2014-07-08 NOTE — Telephone Encounter (Signed)
Patient called stating that the onglyza nor the tradgenta are on the preferred list for insurance   Jason Mcpherson states if there is not another alternative than he will continue with the Januvia with 90 day supply     Please advise   Thank you

## 2014-07-08 NOTE — Telephone Encounter (Signed)
I instructed his wife to have him call his insurance to see about this medication.

## 2014-07-08 NOTE — Telephone Encounter (Signed)
?   Nesina

## 2014-07-08 NOTE — Telephone Encounter (Signed)
Please see below and advise.

## 2014-07-09 ENCOUNTER — Telehealth: Payer: Self-pay | Admitting: Endocrinology

## 2014-07-09 NOTE — Telephone Encounter (Signed)
error 

## 2014-07-16 ENCOUNTER — Other Ambulatory Visit: Payer: Self-pay | Admitting: Endocrinology

## 2014-07-16 NOTE — Telephone Encounter (Signed)
error 

## 2014-07-22 ENCOUNTER — Other Ambulatory Visit: Payer: Self-pay | Admitting: *Deleted

## 2014-07-22 MED ORDER — LOSARTAN POTASSIUM 100 MG PO TABS: ORAL_TABLET | ORAL | Status: DC

## 2014-08-12 ENCOUNTER — Other Ambulatory Visit: Payer: Self-pay | Admitting: Endocrinology

## 2014-08-23 ENCOUNTER — Other Ambulatory Visit: Payer: PRIVATE HEALTH INSURANCE

## 2014-08-23 ENCOUNTER — Ambulatory Visit: Payer: PRIVATE HEALTH INSURANCE | Admitting: Endocrinology

## 2014-09-04 ENCOUNTER — Encounter: Payer: Self-pay | Admitting: Podiatry

## 2014-09-04 ENCOUNTER — Other Ambulatory Visit (INDEPENDENT_AMBULATORY_CARE_PROVIDER_SITE_OTHER): Payer: BLUE CROSS/BLUE SHIELD

## 2014-09-04 ENCOUNTER — Encounter: Payer: Self-pay | Admitting: Endocrinology

## 2014-09-04 ENCOUNTER — Ambulatory Visit (INDEPENDENT_AMBULATORY_CARE_PROVIDER_SITE_OTHER): Payer: BLUE CROSS/BLUE SHIELD | Admitting: Podiatry

## 2014-09-04 ENCOUNTER — Ambulatory Visit (INDEPENDENT_AMBULATORY_CARE_PROVIDER_SITE_OTHER): Payer: BLUE CROSS/BLUE SHIELD

## 2014-09-04 ENCOUNTER — Ambulatory Visit (INDEPENDENT_AMBULATORY_CARE_PROVIDER_SITE_OTHER): Payer: BLUE CROSS/BLUE SHIELD | Admitting: Endocrinology

## 2014-09-04 VITALS — BP 144/72 | HR 85 | Temp 98.2°F | Resp 16 | Ht 69.0 in | Wt 177.2 lb

## 2014-09-04 VITALS — BP 129/72 | HR 74 | Resp 15

## 2014-09-04 DIAGNOSIS — L97511 Non-pressure chronic ulcer of other part of right foot limited to breakdown of skin: Secondary | ICD-10-CM | POA: Diagnosis not present

## 2014-09-04 DIAGNOSIS — L89891 Pressure ulcer of other site, stage 1: Secondary | ICD-10-CM | POA: Diagnosis not present

## 2014-09-04 DIAGNOSIS — IMO0002 Reserved for concepts with insufficient information to code with codable children: Secondary | ICD-10-CM

## 2014-09-04 DIAGNOSIS — I10 Essential (primary) hypertension: Secondary | ICD-10-CM

## 2014-09-04 DIAGNOSIS — E1165 Type 2 diabetes mellitus with hyperglycemia: Secondary | ICD-10-CM

## 2014-09-04 DIAGNOSIS — L97519 Non-pressure chronic ulcer of other part of right foot with unspecified severity: Secondary | ICD-10-CM

## 2014-09-04 DIAGNOSIS — G629 Polyneuropathy, unspecified: Secondary | ICD-10-CM

## 2014-09-04 DIAGNOSIS — E1142 Type 2 diabetes mellitus with diabetic polyneuropathy: Secondary | ICD-10-CM

## 2014-09-04 LAB — BASIC METABOLIC PANEL
BUN: 40 mg/dL — ABNORMAL HIGH (ref 6–23)
CO2: 27 mEq/L (ref 19–32)
Calcium: 10.1 mg/dL (ref 8.4–10.5)
Chloride: 107 mEq/L (ref 96–112)
Creatinine, Ser: 1.44 mg/dL (ref 0.40–1.50)
GFR: 50.81 mL/min — ABNORMAL LOW (ref >60.00)
GLUCOSE: 125 mg/dL — AB (ref 70–99)
POTASSIUM: 4.4 meq/L (ref 3.5–5.1)
Sodium: 139 mEq/L (ref 135–145)

## 2014-09-04 LAB — CBC WITH DIFFERENTIAL/PLATELET
Basophils Absolute: 0.1 10*3/uL (ref 0.0–0.1)
Basophils Relative: 0.9 % (ref 0.0–3.0)
Eosinophils Absolute: 0.3 10*3/uL (ref 0.0–0.7)
Eosinophils Relative: 5.1 % — ABNORMAL HIGH (ref 0.0–5.0)
HCT: 31.9 % — ABNORMAL LOW (ref 39.0–52.0)
HEMOGLOBIN: 10.8 g/dL — AB (ref 13.0–17.0)
LYMPHS PCT: 11.5 % — AB (ref 12.0–46.0)
Lymphs Abs: 0.7 10*3/uL (ref 0.7–4.0)
MCHC: 33.9 g/dL (ref 30.0–36.0)
MCV: 89.1 fl (ref 78.0–100.0)
MONOS PCT: 10.9 % (ref 3.0–12.0)
Monocytes Absolute: 0.6 10*3/uL (ref 0.1–1.0)
NEUTROS PCT: 71.6 % (ref 43.0–77.0)
Neutro Abs: 4.2 10*3/uL (ref 1.4–7.7)
Platelets: 218 10*3/uL (ref 150.0–400.0)
RBC: 3.58 Mil/uL — ABNORMAL LOW (ref 4.22–5.81)
RDW: 14.6 % (ref 11.5–15.5)
WBC: 5.8 10*3/uL (ref 4.0–10.5)

## 2014-09-04 LAB — HEMOGLOBIN A1C: Hgb A1c MFr Bld: 6.3 % (ref 4.6–6.5)

## 2014-09-04 MED ORDER — AMOXICILLIN-POT CLAVULANATE 875-125 MG PO TABS: 1.0000 | ORAL_TABLET | Freq: Two times a day (BID) | ORAL | Status: DC

## 2014-09-04 NOTE — Progress Notes (Signed)
Patient ID: Jason Mcpherson, male   DOB: 1940-07-23, 74 y.o.   MRN: [id number]    Reason for Appointment: Infection of toe and follow-up for Type 2 Diabetes  History of Present Illness:          Diagnosis: Type 2 diabetes mellitus, date of diagnosis: 1997       Past history: The patient is unclear about his symptoms at diagnosis as well as initial treatment. Most likely has been on metformin for sometime and at some point was also given glipizide. He is also very unclear about when he was started on insulin and records from his previous endocrinologist are not available; may have been taking this over the last 15 years. Also not clear if he had been on different types of insulin However he was previously significantly overweight His A1c in 9/14 was 5.9 and in 2015 had been about 6.9-7 On his initial consultation he was taking metformin and glipizide ER; also he was taking Levemir irregularly based on his blood sugar level Levemir was stopped on his initial consultation in 6/15  Recent history:   He has been on Januvia 50 mg daily since 10/29/13 after stopping his Levemir insulin   His metformin was reduced to 1500 mg initially because of renal dysfunction He is on a regimen of glipizide ER 2.5 mg daily also  With this regimen his blood sugars are still looking fairly well but he has not been seen in follow-up since 01/2014 He has not checked his blood sugars at any other time except around supper time and occasionally after supper Weight has stabilized He is relatively active at work but not doing any formal exercise He is complaining about the cost of Januvia  Hypoglycemia: None since glipizide was reduced in 10/15         Oral hypoglycemic drugs the patient is taking are: Metformin, glipizide ER, 2.5 mg.  Januvia 50 mg      Side effects from medications have been: None INSULIN regimen is described as: None  Glucose monitoring:  done less than once a day with One Touch  monitor Blood Glucose readings from meter download recently: Fasting 131 today Suppertime range 86-153 and late evening 116-166 OVERALL median reading 116  Glycemic control:  Lab Results  Component Value Date   HGBA1C 5.7 02/21/2014   HGBA1C 6.7* 08/17/2013   Lab Results  Component Value Date   MICROALBUR 2.4* 10/11/2013   CREATININE 1.3 02/21/2014    Self-care: The diet that the patient has been following is: tries to limit fats and sweets  He is usually eating cereal at breakfast or sometimes peanut butter sandwich. Usually eating a sandwich at lunch and meat and salad with beans at dinner. Have snacks with Jell-O. Eating out about twice a week    Exercise: walks in his driveway or active at work         Dietician visit:  years ago. Does have a diet plan at home              Weight history: Highest weight in the past has been 220 and lowest 157  Wt Readings from Last 3 Encounters:  09/04/14 177 lb 3.2 oz (80.377 kg)  02/21/14 177 lb 3.2 oz (80.377 kg)  11/20/13 167 lb 3.2 oz (75.841 kg)    PROBLEM 2: He says that for the last 2 weeks she has had some swelling in the right lower leg and some ulceration of the right second  toe distally No recent injury. Apparently has had problems with foot infection treated by podiatrist previously He did not seek any help until today      Medication List       This list is accurate as of: 09/04/14  9:22 AM.  Always use your most recent med list.               aspirin 81 MG chewable tablet  Chew 81 mg by mouth daily.     furosemide 20 MG tablet  Commonly known as:  LASIX  TAKE 1 TABLET (20 MG TOTAL) BY MOUTH DAILY.     glipiZIDE 2.5 MG 24 hr tablet  Commonly known as:  GLUCOTROL XL  TAKE 1 TABLET (2.5 MG TOTAL) BY MOUTH DAILY WITH BREAKFAST.     losartan 100 MG tablet  Commonly known as:  COZAAR  Take 1 tablet daily     metFORMIN 1000 MG tablet  Commonly known as:  GLUCOPHAGE  Take 1 tablet (1,000 mg total) by mouth 2  (two) times daily with a meal.     ONE TOUCH ULTRA TEST test strip  Generic drug:  glucose blood  USE AS DIRECTED TO TEST 2 TIMES DAILY     ONETOUCH DELICA LANCETS FINE Misc  by Does not apply route 2 (two) times daily.     pantoprazole 40 MG tablet  Commonly known as:  PROTONIX     simvastatin 20 MG tablet  Commonly known as:  ZOCOR  Take 20 mg by mouth every evening.     sitaGLIPtin 100 MG tablet  Commonly known as:  JANUVIA  Take 1 tablet (100 mg total) by mouth daily.        Allergies: No Known Allergies  Past Medical History  Diagnosis Date  . Diabetes mellitus without complication   . Hyperlipemia   . Cellulitis 08/2013    RT LEG  . Dehydration   . Hyperkalemia 08/2013  . Chronic kidney disease 08/2013    ACUTE RENAL   . Anemia   . Hypertension   . Arthritis     KNEES & BACK    Past Surgical History  Procedure Laterality Date  . Back surgery      10 years ago  . Anterior cervical decomp/discectomy fusion N/A 10/17/2012    Procedure: ANTERIOR CERVICAL DECOMPRESSION/DISCECTOMY FUSION 2 LEVELS;  Surgeon: Charlie Pitter, MD;  Location: [facility];  Service: Neurosurgery;  Laterality: N/A;  Cervical three-four, four-five anterior cervical decompression fusion with allograft plating  . Cervical disc surgery      Family History  Problem Relation Age of Onset  . Cancer - Colon Mother   . Cancer - Prostate Father     Social History:  reports that he quit smoking about 16 years ago. He has never used smokeless tobacco. He reports that he does not drink alcohol or use illicit drugs.    Review of Systems       Lipids: treated with Zocor 20 mg       Lab Results  Component Value Date   LDLDIRECT 48.9 02/21/2014    He is having less swelling since his last visit except on the right side, taking Lasix as needed  He has history of anemia and not sure if that has been evaluated or followed up by PCP   Physical Examination:  BP 144/72 mmHg  Pulse 85   Temp(Src) 98.2 F (36.8 C)  Resp 16  Ht 5\' 9"  (1.753 m)  Wt 177 lb 3.2 oz (80.377 kg)  BMI 26.16 kg/m2  SpO2 95%  He has 2+ right lower leg edema and none on the left side  His right second toe has significant redness especially distally and also swelling, distal part of the toe is plantar flexed He has a 1-1 cm shallow ulcer at the end of the toe with some surrounding callus formation and small amount of serous exudate No tenderness His foot and lower leg appear to be warm also has mild erythema of the lower leg Plantar surface of the right foot is normal  ASSESSMENT:  Right second toe ulcer: He has surrounding cellulitis and reactive erythema and swelling of the lower leg per Since he has significant infection he be started on antibiotics and needs follow-up with podiatrist for local surgical treatment and further evaluation He does appear to have nearly normal sensation on his right distal toes  Diabetes type 2, normal BMI His blood sugars are  overall appearing been controlled and he has less hypoglycemic symptoms with taking only 2.5 mg glipizide ER However he has not been seen in several months and has not had a recent A1c His weight has stabilized Currently on 50 mg Januvia along with regimen of 1500 mg metformin and glipizide ER 2.5 mg daily He does complain about the cost of Januvia but discussed that it has helped his control especially postprandial hyperglycemia and has been able to avoid insulin He is also fairly compliant with diet; trying to stay active  Hypertension: His blood pressure is  controlled with 100 mg losartan    PLAN:  He will continue his glipizide ER  2.5 mg along with low-dose Januvia and 1500 mg of metformin May need to adjust the dose if renal function abnormal Discussed trying to check blood sugars at after meals more often Advised him to check on alternatives to Januvia from his insurance company if cost is a factor  May exercise more  consistently once his foot infection is healing Discussed blood sugar targets To start Augmentin 875 twice a day for 10 days.  He will take this with food and a full glass of water He will go back to the podiatrist for further management Discussed more regular follow-up and also preventive checks with podiatrist and ophthalmologist  Check A1c and other labs including renal function Follow-up in 3 months   Counseling time on subjects discussed above is over 50% of today's 25 minute visit   [place] 09/04/2014, 9:22 AM   Note: This office note was prepared with Estate agent. Any transcriptional errors that result from this process are unintentional.  Addendum: A1c is upper normal, no leukocytosis Creatinine clearance 51. Still has anemia, labs forwarded to PCP

## 2014-09-04 NOTE — Progress Notes (Signed)
Subjective:     Patient ID: Jason Mcpherson, male   DOB: 22-Dec-1940, 74 y.o.   MRN: [id number]  HPI patient presents stating the second toe has really started to give me problems again and it was fine for over a year. States that it has been red and he saw his family physician who placed him on Augmentin and sent him here   Review of Systems     Objective:   Physical Exam Neurovascular status intact with muscle strength adequate range of motion within normal limits. Patient does have distal redness of the second toe right localized to the distal phalanx and distal to that point with a distal keratotic lesion and a small breakdown of tissue measuring 3 x 3 mm    Assessment:     Distal ulceration second toe right foot with elongated toe and moderate plantar flexion of the toe part of the process along with long-term diabetes and neuropathy    Plan:     Reviewed condition and at this time debrided distal tissue flushed and applied a small amount of Iodosorb to the area with sterile dressing. Applied a buttress pad and he will begin his Augmentin 875 twice a day and be seen back by me. Reviewed x-rays and discussed there may be bone involvement and at one point he may require amputation but we will continue to treat him clinically

## 2014-09-04 NOTE — Patient Instructions (Addendum)
Please check blood sugars at least half the time about 2 hours after any meal and 3 times per week on waking up.  Please bring blood sugar monitor to each visit.  Recommended blood sugar levels about 2 hours after meal is 140-180 and on waking up 90-130

## 2014-09-11 ENCOUNTER — Encounter: Payer: Self-pay | Admitting: Podiatry

## 2014-09-11 ENCOUNTER — Ambulatory Visit (INDEPENDENT_AMBULATORY_CARE_PROVIDER_SITE_OTHER): Payer: BLUE CROSS/BLUE SHIELD | Admitting: Podiatry

## 2014-09-11 VITALS — BP 157/85 | HR 80 | Resp 15

## 2014-09-11 DIAGNOSIS — L97519 Non-pressure chronic ulcer of other part of right foot with unspecified severity: Secondary | ICD-10-CM

## 2014-09-11 DIAGNOSIS — L89891 Pressure ulcer of other site, stage 1: Secondary | ICD-10-CM | POA: Diagnosis not present

## 2014-09-13 NOTE — Progress Notes (Signed)
Subjective:     Patient ID: Jason Mcpherson, male   DOB: Feb 25, 1941, 74 y.o.   MRN: [id number]  HPI patient states my toe is feeling quite a bit better but there still is some redness   Review of Systems     Objective:   Physical Exam Neurovascular status intact with distal crusting second toe right foot that's localized in nature with no indications currently of drainage odor or proximal edema erythema or drainage noted    Assessment:     Distal ulceration right second toe that appears to be healing well with previous treatment    Plan:     Debrided tissue and did not note any drainage at this time. Clean the area applied a small amount of Iodosorb sterile dressing and advised on soaks and continued padding to keep pressure off the toe. Reappoint if any redness drainage or swelling should occur and reappoint in 2 months for routine visit if those do not occur

## 2014-10-16 ENCOUNTER — Ambulatory Visit (INDEPENDENT_AMBULATORY_CARE_PROVIDER_SITE_OTHER): Payer: BLUE CROSS/BLUE SHIELD | Admitting: Endocrinology

## 2014-10-16 ENCOUNTER — Encounter: Payer: Self-pay | Admitting: Endocrinology

## 2014-10-16 VITALS — BP 134/70 | HR 81 | Temp 98.5°F | Resp 16 | Wt 178.0 lb

## 2014-10-16 DIAGNOSIS — E1165 Type 2 diabetes mellitus with hyperglycemia: Secondary | ICD-10-CM

## 2014-10-16 DIAGNOSIS — L97511 Non-pressure chronic ulcer of other part of right foot limited to breakdown of skin: Secondary | ICD-10-CM

## 2014-10-16 DIAGNOSIS — N289 Disorder of kidney and ureter, unspecified: Secondary | ICD-10-CM | POA: Diagnosis not present

## 2014-10-16 DIAGNOSIS — I10 Essential (primary) hypertension: Secondary | ICD-10-CM

## 2014-10-16 DIAGNOSIS — IMO0002 Reserved for concepts with insufficient information to code with codable children: Secondary | ICD-10-CM

## 2014-10-16 LAB — MICROALBUMIN / CREATININE URINE RATIO
CREATININE, U: 84.8 mg/dL
MICROALB UR: 1.8 mg/dL (ref 0.0–1.9)
Microalb Creat Ratio: 2.1 mg/g (ref 0.0–30.0)

## 2014-10-16 MED ORDER — DOXYCYCLINE HYCLATE 100 MG PO TABS: 100.0000 mg | ORAL_TABLET | Freq: Two times a day (BID) | ORAL | Status: DC

## 2014-10-16 NOTE — Patient Instructions (Signed)
See foot Dr next week  Take full Januvia

## 2014-10-16 NOTE — Progress Notes (Signed)
Patient ID: Jason Mcpherson, male   DOB: 02-18-1941, 74 y.o.   MRN: [id number]    Reason for Appointment: Infection of toe and follow-up for Type 2 Diabetes  History of Present Illness:          Diagnosis: Type 2 diabetes mellitus, date of diagnosis: 1997       Past history: The patient is unclear about his symptoms at diagnosis as well as initial treatment. Most likely has been on metformin for sometime and at some point was also given glipizide. He is also very unclear about when he was started on insulin and records from his previous endocrinologist are not available; may have been taking this over the last 15 years. Also not clear if he had been on different types of insulin However he was previously significantly overweight His A1c in 9/14 was 5.9 and in 2015 had been about 6.9-7 On his initial consultation he was taking metformin and glipizide ER; also he was taking Levemir irregularly based on his blood sugar level Levemir was stopped on his initial consultation in 6/15  Recent history:   He has been on Januvia 50 mg daily since 10/29/13 after stopping his Levemir insulin   His metformin was reduced to 1500 mg initially because of renal dysfunction He is on a regimen of glipizide ER 2.5 mg daily also  With this regimen his blood sugars A1c has been upper normal fairly consistently with the last level being in 5/16 However he still has relatively high readings after supper and occasionally as high as 206 Otherwise blood sugars before breakfast and supper are relatively better Because of the cost is taking only half a tablet of the Januvia 100 mg Weight has stabilized He is relatively active at work but not doing any formal exercise He is complaining about the cost of Januvia  Hypoglycemia: None          Oral hypoglycemic drugs the patient is taking are: Metformin 1 g twice a day, glipizide ER, 2.5 mg.  Januvia 50 mg      Side effects from medications have been:  None  Glucose monitoring:  done less than once a day with One Touch monitor Blood Glucose readings from meter download recently: Fasting 120 today Suppertime range 83-125 and about 2 hours after supper 114-206 OVERALL median reading 141, previously 116  Glycemic control:  Lab Results  Component Value Date   HGBA1C 6.3 09/04/2014   HGBA1C 5.7 02/21/2014   HGBA1C 6.7* 08/17/2013   Lab Results  Component Value Date   MICROALBUR 2.4* 10/11/2013   CREATININE 1.44 09/04/2014    Self-care: The diet that the patient has been following is: tries to limit fats and sweets  He is usually eating cereal at breakfast or sometimes peanut butter sandwich. Usually eating a sandwich at lunch and meat and salad with beans at dinner. Have snacks with Jell-O. Eating out about twice a week    Exercise: walks in his driveway or active at work         Dietician visit:  years ago. Does have a diet plan at home              Weight history: Highest weight in the past has been 220 and lowest 157  Wt Readings from Last 3 Encounters:  10/16/14 178 lb (80.74 kg)  09/04/14 177 lb 3.2 oz (80.377 kg)  02/21/14 177 lb 3.2 oz (80.377 kg)    PROBLEM 2: He came in  today for recurrence of his right second toe infection; this was treated with Augmentin last month and he had also done some local care with his podiatrist He has had some drainage from the ulcer and this has not healed as well as eating some swelling of the right lower leg although less than before.  Has not seen a podiatrist in follow-up      Medication List       This list is accurate as of: 10/16/14  8:09 AM.  Always use your most recent med list.               amoxicillin-clavulanate 875-125 MG per tablet  Commonly known as:  AUGMENTIN  Take 1 tablet by mouth 2 (two) times daily.     aspirin 81 MG chewable tablet  Chew 81 mg by mouth daily.     furosemide 20 MG tablet  Commonly known as:  LASIX  TAKE 1 TABLET (20 MG TOTAL) BY MOUTH  DAILY.     glipiZIDE 2.5 MG 24 hr tablet  Commonly known as:  GLUCOTROL XL  TAKE 1 TABLET (2.5 MG TOTAL) BY MOUTH DAILY WITH BREAKFAST.     losartan 100 MG tablet  Commonly known as:  COZAAR  Take 1 tablet daily     metFORMIN 1000 MG tablet  Commonly known as:  GLUCOPHAGE  Take 1 tablet (1,000 mg total) by mouth 2 (two) times daily with a meal.     ONE TOUCH ULTRA TEST test strip  Generic drug:  glucose blood  USE AS DIRECTED TO TEST 2 TIMES DAILY     ONETOUCH DELICA LANCETS FINE Misc  by Does not apply route 2 (two) times daily.     pantoprazole 40 MG tablet  Commonly known as:  PROTONIX     simvastatin 20 MG tablet  Commonly known as:  ZOCOR  Take 20 mg by mouth every evening.     sitaGLIPtin 100 MG tablet  Commonly known as:  JANUVIA  Take 1 tablet (100 mg total) by mouth daily.        Allergies: No Known Allergies  Past Medical History  Diagnosis Date  . Diabetes mellitus without complication   . Hyperlipemia   . Cellulitis 08/2013    RT LEG  . Dehydration   . Hyperkalemia 08/2013  . Chronic kidney disease 08/2013    ACUTE RENAL   . Anemia   . Hypertension   . Arthritis     KNEES & BACK    Past Surgical History  Procedure Laterality Date  . Back surgery      10 years ago  . Anterior cervical decomp/discectomy fusion N/A 10/17/2012    Procedure: ANTERIOR CERVICAL DECOMPRESSION/DISCECTOMY FUSION 2 LEVELS;  Surgeon: Charlie Pitter, MD;  Location: [facility];  Service: Neurosurgery;  Laterality: N/A;  Cervical three-four, four-five anterior cervical decompression fusion with allograft plating  . Cervical disc surgery      Family History  Problem Relation Age of Onset  . Cancer - Colon Mother   . Cancer - Prostate Father     Social History:  reports that he quit smoking about 17 years ago. He has never used smokeless tobacco. He reports that he does not drink alcohol or use illicit drugs.    Review of Systems       Lipids: treated with Zocor 20  mg       Lab Results  Component Value Date   LDLDIRECT 48.9 02/21/2014  He is having less swelling since his last visit except on the right side, taking Lasix as needed  He has history of anemia and not sure if that has been evaluated or followed up by PCP   Physical Examination:  BP 134/70 mmHg  Pulse 81  Temp(Src) 98.5 F (36.9 C) (Oral)  Resp 16  Wt 178 lb (80.74 kg)  SpO2 95%   Warmth and mild swelling on the second toe on the right.  Has shallow clean ulcer on the plantar surface of the distal toe.  There is significant amount of callus surrounding the ulcer No warmth of the foot.  Has 1+ swelling of the right lower leg and mild warmth.  See foot exam section  ASSESSMENT:  Right second toe ulcer: He has cellulitis of the toe and also some persistent swelling of the right lower leg Previously treated with antibiotics  He has difficulty healing the ulcer because of his working long hours and walking on concrete floors  Diabetes type 2, normal BMI His blood sugars are  overall appearing been controlled but has higher readings after evening meal now He was given the co-pay card for Januvia and he will try to take 100 mg daily Consultation with dietitian on the next visit needed He is also fairly compliant with diet; trying to stay active  Hypertension: His blood pressure is  controlled with 100 mg losartan    PLAN:  He will continue his glipizide ER  2.5 mg along with increasing the Januvia to 100 mg Try to be consistent with diet with avoiding high-fat and high carbohydrate meals Will need to monitor kidney function periodically as this may affect the doses of his medications To start doxycycline 100 mg twice a day for 10 days Follow-up with podiatrist next week.  Because of his deformities may consider diabetic shoes and this was discussed He needs to try and reduce his work hours and he will talk to PCP  He will schedule follow-up with eye doctor Counseling  time on subjects discussed above is over 50% of today's 25 minute visit     Jhade Berko 10/16/2014, 8:09 AM   Note: This office note was prepared with Estate agent. Any transcriptional errors that result from this process are unintentional.

## 2014-10-21 ENCOUNTER — Encounter: Payer: Self-pay | Admitting: Podiatry

## 2014-10-21 ENCOUNTER — Ambulatory Visit (INDEPENDENT_AMBULATORY_CARE_PROVIDER_SITE_OTHER): Payer: BLUE CROSS/BLUE SHIELD | Admitting: Podiatry

## 2014-10-21 ENCOUNTER — Ambulatory Visit (INDEPENDENT_AMBULATORY_CARE_PROVIDER_SITE_OTHER): Payer: BLUE CROSS/BLUE SHIELD

## 2014-10-21 VITALS — BP 140/81 | HR 64 | Temp 96.5°F | Resp 14

## 2014-10-21 DIAGNOSIS — M79606 Pain in leg, unspecified: Secondary | ICD-10-CM | POA: Diagnosis not present

## 2014-10-21 DIAGNOSIS — R52 Pain, unspecified: Secondary | ICD-10-CM

## 2014-10-21 DIAGNOSIS — M2041 Other hammer toe(s) (acquired), right foot: Secondary | ICD-10-CM | POA: Diagnosis not present

## 2014-10-21 DIAGNOSIS — L89891 Pressure ulcer of other site, stage 1: Secondary | ICD-10-CM

## 2014-10-21 DIAGNOSIS — B351 Tinea unguium: Secondary | ICD-10-CM | POA: Diagnosis not present

## 2014-10-21 DIAGNOSIS — L97519 Non-pressure chronic ulcer of other part of right foot with unspecified severity: Secondary | ICD-10-CM

## 2014-10-21 NOTE — Progress Notes (Signed)
Subjective:     Patient ID: Jeanann Lewandowsky, male   DOB: 11/18/1940, 74 y.o.   MRN: [id number]  HPI patient states my right second toe is bothering me again and I'm back on antibiotics from my family physician. Patient states he wants to work the rest of this year and he knows she's probably going to need surgery but he like to see if he can make it until that time   Review of Systems     Objective:   Physical Exam Neurovascular status unchanged with patient stating that his diabetes is been under good control and I noted his vascular status to be adequate neurological be diminished with elongated second toe right with distal keratotic lesion breakdown of tissue that's localized with no proximal edema erythema drainage currently noted    Assessment:     Damage to the structure of the second digit right distal with keratotic lesion formation secondary to hammertoe position with possibility long-term for bone infection and possibility for chronic infection    Plan:     Reviewed condition with lesion formation and no current indications of active infection. Today I debrided the tissue created a nice clean subcutaneous space with no bone prominence or probing when I checked and applied Iodosorb with sterile dressing instructed on soaks Silvadene home usage and padding therapy. Continue doxycycline the rest of this and we will reevaluate again in 1 month with explanation the patient that there is a very good chance long-term that he is can require at least a partial amputation of the second toe right. If symptoms were to get worse or if he should develop any systemic signs of infection he is to go to the emergency room or a point [place] if possible

## 2014-11-11 ENCOUNTER — Ambulatory Visit (INDEPENDENT_AMBULATORY_CARE_PROVIDER_SITE_OTHER): Payer: BLUE CROSS/BLUE SHIELD | Admitting: Podiatry

## 2014-11-11 ENCOUNTER — Ambulatory Visit: Payer: BLUE CROSS/BLUE SHIELD | Admitting: Podiatry

## 2014-11-11 ENCOUNTER — Ambulatory Visit (INDEPENDENT_AMBULATORY_CARE_PROVIDER_SITE_OTHER): Payer: BLUE CROSS/BLUE SHIELD

## 2014-11-11 ENCOUNTER — Encounter: Payer: Self-pay | Admitting: Podiatry

## 2014-11-11 VITALS — BP 111/62 | HR 83 | Resp 16

## 2014-11-11 DIAGNOSIS — L97511 Non-pressure chronic ulcer of other part of right foot limited to breakdown of skin: Secondary | ICD-10-CM | POA: Diagnosis not present

## 2014-11-11 MED ORDER — CEPHALEXIN 500 MG PO CAPS: 500.0000 mg | ORAL_CAPSULE | Freq: Two times a day (BID) | ORAL | Status: DC

## 2014-11-11 NOTE — Patient Instructions (Signed)
Start antibiotic Change dressing daily with antibiotic ointment and a bandage. Monitor for any signs/symptoms of infection. Call the office immediately if any occur or go directly to the emergency room. Call with any questions/concerns.

## 2014-11-11 NOTE — Progress Notes (Signed)
Patient ID: OLAOLUWA FERNS, male   DOB: Sep 13, 1940, 74 y.o.   MRN: [id number]  Subjective: 74 year old male persists the office today for evaluation of ulceration the distal aspect of the right second digit. He states that he is not taking any antibiotic's at this time. He continues to soak his foot with Epsom salts daily. He does not apply bandage or any ointment to the wound. He states his toe is been swollen however is not as significant as what it has been previously. He denies a drainage or purulence coming from the wound. He denies any red streaks any redness of the toe. Denies any pain. No other complaints at this time. Denies any systemic complaints as fevers, chills, nausea, vomiting.  Objective: AAO 3, NAD  DP/PT pulses palpable, CRT less than 3 seconds Protective sensation decreased with Sims Weinstein monofilament At the distal aspect of the right second digit there is an annular ulceration with hyperkeratotic periwound. Upon debridement of the ulcerations and wound measures proximal 0.3 x 0.2 cm x 0.2 cm. There is no probe to bone (although close), undermining, tunneling. Small amount of serosanguineous drainage is expressed. There is no purulence expressed. There is mild edema and a faint erythema to distal aspect of the digit. There is no ascending cellulitis, fluctuance, crepitus, malodor. Hammertoe contractures of the digits. No other open lesions or pre-ulcerative lesions identified bilaterally No pain with calf compression, swelling, warmth, erythema  Assessment: 74 year old male right second digit ulceration due to underlying hammertoe  Plan: -X-rays were obtained and reviewed with the patient. There is no definitive evidence of osteomyelitis at this time. -Treatment options discussed including all alternatives, risks, and complications -Wound shop and debrided to healthy, bleeding, granular tissue. Iodosorb was applied followed by dry sterile dressing. Continue daily  dressing changes with antibiotic ointment and a bandage. -Rx keflex due to erythema/edema -Discussed surgical intervention with the patient to correct underlying hammertoe deformity. I believe this would help offload the pressure to the distal aspect of the toe to help the wound heal. Due to the erythema is to hold off on surgery until the localized infection resolves. Also upon further discussion the patient is now on proceed with surgery top of the first the year. Discussed with him that he is a high likelihood of amputation. -Monitor for any clinical signs or symptoms of infection and directed to call the office immediately should any occur or go to the ER. -Follow-up 2 weeks or sooner if any problems arise. In the meantime, encouraged to call the office with any questions, concerns, change in symptoms.   Celesta Gentile, DPM

## 2014-11-22 ENCOUNTER — Other Ambulatory Visit: Payer: Self-pay | Admitting: Endocrinology

## 2014-11-25 ENCOUNTER — Encounter: Payer: Self-pay | Admitting: Podiatry

## 2014-11-25 ENCOUNTER — Ambulatory Visit (INDEPENDENT_AMBULATORY_CARE_PROVIDER_SITE_OTHER): Payer: BLUE CROSS/BLUE SHIELD | Admitting: Podiatry

## 2014-11-25 VITALS — BP 123/69 | HR 80 | Temp 97.9°F | Resp 14

## 2014-11-25 DIAGNOSIS — L97511 Non-pressure chronic ulcer of other part of right foot limited to breakdown of skin: Secondary | ICD-10-CM

## 2014-11-25 DIAGNOSIS — M2041 Other hammer toe(s) (acquired), right foot: Secondary | ICD-10-CM | POA: Diagnosis not present

## 2014-11-25 NOTE — Progress Notes (Signed)
Patient ID: DEARIS THIERRY, male   DOB: Jun 23, 1940, 73 y.o.   MRN: [id number]  Subjective: 74 year old male presents to the office today for evaluation of ulceration the distal aspect of the right second digit. He's been applying antibiotic ointment and a bandage daily to the area. He is also continue on antibiotic's. Denies any drainage or purulence. States the redness has decreased. He denies any red streaks any redness of the toe. Denies any pain. No other complaints at this time. Denies any systemic complaints as fevers, chills, nausea, vomiting.  Objective: AAO 3, NAD  DP/PT pulses palpable, CRT less than 3 seconds Protective sensation decreased with Simms Weinstein monofilament At the distal aspect of the right second digit there is an annular ulceration with hyperkeratotic periwound. Upon debridement of the ulcerations and wound measures proximal 0.2 x 0.2 cm x 0.2 cm. There is no probe to bone,, undermining, tunneling. No drainage is expressed. There is no purulence expressed. There is mild edema and a faint erythema to distal aspect of the digit, although this has improved. There is no ascending cellulitis, fluctuance, crepitus, malodor. Hammertoe contractures of the digits. No other open lesions or pre-ulcerative lesions identified bilaterally No pain with calf compression, swelling, warmth, erythema  Assessment: 74 year old male right second digit ulceration due to underlying hammertoe  Plan: -Treatment options discussed including all alternatives, risks, and complications -Wound sharply  debrided to healthy, bleeding, granular tissue. Iodosorb was applied followed by dry sterile dressing. Continue daily dressing changes with antibiotic ointment and a bandage. -Finish Keflex -Again discussed surgery to help correct the hammertoe in order to get the wound to heal, however he wants to hold off on surgery at this time.  -Paperwork completed for diabetic shoes for pre-certification.   -Monitor for any clinical signs or symptoms of infection and directed to call the office immediately should any occur or go to the ER. -Follow-up 2 weeks or sooner if any problems arise. In the meantime, encouraged to call the office with any questions, concerns, change in symptoms.   Celesta Gentile, DPM

## 2014-12-11 ENCOUNTER — Other Ambulatory Visit (INDEPENDENT_AMBULATORY_CARE_PROVIDER_SITE_OTHER): Payer: BLUE CROSS/BLUE SHIELD

## 2014-12-11 DIAGNOSIS — IMO0002 Reserved for concepts with insufficient information to code with codable children: Secondary | ICD-10-CM

## 2014-12-11 DIAGNOSIS — E1165 Type 2 diabetes mellitus with hyperglycemia: Secondary | ICD-10-CM | POA: Diagnosis not present

## 2014-12-11 LAB — COMPREHENSIVE METABOLIC PANEL
ALK PHOS: 63 U/L (ref 39–117)
ALT: 15 U/L (ref 0–53)
AST: 17 U/L (ref 0–37)
Albumin: 4.1 g/dL (ref 3.5–5.2)
BILIRUBIN TOTAL: 0.6 mg/dL (ref 0.2–1.2)
BUN: 38 mg/dL — AB (ref 6–23)
CO2: 27 mEq/L (ref 19–32)
Calcium: 9.7 mg/dL (ref 8.4–10.5)
Chloride: 108 mEq/L (ref 96–112)
Creatinine, Ser: 1.34 mg/dL (ref 0.40–1.50)
GFR: 55.17 mL/min — ABNORMAL LOW (ref >60.00)
Glucose, Bld: 142 mg/dL — ABNORMAL HIGH (ref 70–99)
Potassium: 4.1 mEq/L (ref 3.5–5.1)
SODIUM: 142 meq/L (ref 135–145)
TOTAL PROTEIN: 7.2 g/dL (ref 6.0–8.3)

## 2014-12-11 LAB — LIPID PANEL
CHOLESTEROL: 138 mg/dL (ref 0–200)
HDL: 64.3 mg/dL (ref >39.00)
LDL CALC: 63 mg/dL (ref 0–99)
NonHDL: 73.83
Total CHOL/HDL Ratio: 2
Triglycerides: 52 mg/dL (ref 0.0–149.0)
VLDL: 10.4 mg/dL (ref 0.0–40.0)

## 2014-12-11 LAB — HEMOGLOBIN A1C: HEMOGLOBIN A1C: 6.3 % (ref 4.6–6.5)

## 2014-12-16 ENCOUNTER — Encounter: Payer: Self-pay | Admitting: Endocrinology

## 2014-12-16 ENCOUNTER — Ambulatory Visit (INDEPENDENT_AMBULATORY_CARE_PROVIDER_SITE_OTHER): Payer: BLUE CROSS/BLUE SHIELD | Admitting: Podiatry

## 2014-12-16 ENCOUNTER — Ambulatory Visit (INDEPENDENT_AMBULATORY_CARE_PROVIDER_SITE_OTHER): Payer: BLUE CROSS/BLUE SHIELD | Admitting: Endocrinology

## 2014-12-16 VITALS — BP 128/62 | HR 71 | Temp 97.8°F | Resp 16 | Ht 68.0 in | Wt 175.0 lb

## 2014-12-16 DIAGNOSIS — E7800 Pure hypercholesterolemia, unspecified: Secondary | ICD-10-CM

## 2014-12-16 DIAGNOSIS — M2041 Other hammer toe(s) (acquired), right foot: Secondary | ICD-10-CM | POA: Diagnosis not present

## 2014-12-16 DIAGNOSIS — L97511 Non-pressure chronic ulcer of other part of right foot limited to breakdown of skin: Secondary | ICD-10-CM | POA: Diagnosis not present

## 2014-12-16 DIAGNOSIS — E119 Type 2 diabetes mellitus without complications: Secondary | ICD-10-CM

## 2014-12-16 NOTE — Patient Instructions (Addendum)
Check blood sugars on waking up .Marland Kitchen 2-3 .Marland Kitchen times a week Also check blood sugars about 2 hours after a meal and do this after different meals by rotation  Recommended blood sugar levels on waking up is 90-130 and about 2 hours after meal is 140-180 Please bring blood sugar monitor to each visit.  May take 1/2 [place] daily

## 2014-12-16 NOTE — Progress Notes (Signed)
Patient ID: Jason Mcpherson, male   DOB: [birth year], 74 y.o.   MRN: [id number]  Subjective: 74 year old male presents to the office today for evaluation of ulceration the distal aspect of the right second digit. He has continued to apply antibiotic ointment and a bandage daily.  Denies any drainage or purulence. Denies any surrounding erythema or red streaking. Denies any pain. No other complaints at this time. Denies any systemic complaints as fevers, chills, nausea, vomiting.  Objective: AAO 3, NAD  DP/PT pulses palpable, CRT less than 3 seconds Protective sensation decreased with Simms Weinstein monofilament At the distal aspect of the right second digit there is an annular ulceration with hyperkeratotic periwound. Upon debridement of the ulcerations and wound measures proximal 0.2 x 0.2 cm x 0.1 cm. There is no probe to bone, undermining, tunneling. No drainage/pus is expressed. There is trace edema to the digit however is significantly improved. There is no surrounding erythema, ascending synovitis, fluctuance, crepitus, malodor, drainage/purulence.Hammertoe contractures of the digits. No other open lesions or pre-ulcerative lesions identified bilaterally No pain with calf compression, swelling, warmth, erythema  Assessment: 74 year old male right second digit ulceration due to underlying hammertoe; no signs of infection at this time.  Plan: -Treatment options discussed including all alternatives, risks, and complications -Wound sharply  debrided to healthy, bleeding, granular tissue. Iodosorb was applied followed by dry sterile dressing. Continue daily dressing changes with antibiotic ointment and a bandage. -Awaiting diabetic shoe approval.  -Continue offloading pads  -Monitor for any clinical signs or symptoms of infection and directed to call the office immediately should any occur or go to the ER. -Follow-up 2-3 weeks or sooner if any problems arise. In the meantime, encouraged to  call the office with any questions, concerns, change in symptoms.   Celesta Gentile, DPM

## 2014-12-16 NOTE — Progress Notes (Signed)
Patient ID: Jason Mcpherson, male   DOB: 03/06/1941, 74 y.o.   MRN: [id number]    Reason for Appointment: follow-up for Type 2 Diabetes  History of Present Illness:          Diagnosis: Type 2 diabetes mellitus, date of diagnosis: 1997       Past history: The patient is unclear about his symptoms at diagnosis as well as initial treatment. Most likely has been on metformin for sometime and at some point was also given glipizide. He is also very unclear about when he was started on insulin and records from his previous endocrinologist are not available; may have been taking this over the last 15 years. Also not clear if he had been on different types of insulin However he was previously significantly overweight His A1c in 9/14 was 5.9 and in 2015 had been about 6.9-7 On his initial consultation he was taking metformin and glipizide ER; also he was taking Levemir irregularly based on his blood sugar level Levemir was stopped on his initial consultation in 6/15  Recent history:   He was on Januvia 50 mg daily since 10/29/13 after stopping his Levemir insulin   He is on a regimen of glipizide ER 2.5 mg and metformin 1 g twice a day daily also Since his renal function was adequate he was told to increase his Januvia to 100 mg to help with his postprandial hyperglycemia with readings as high as 206.  However had been concerned about the cost of Januvia  With this regimen his blood sugars A1c has been upper normal and stable at 6.3 now However he has not done any monitoring after meals recently Weight has been fairly good and slightly less now He is relatively active at work but not doing any formal exercise  Hypoglycemia: None          Oral hypoglycemic drugs the patient is taking are: Metformin 1 g twice a day, glipizide ER, 2.5 mg.  Januvia 50 mg      Side effects from medications have been: None  Glucose monitoring:  done less than once a day with One Touch monitor Blood  Glucose readings from meter download recently: Fasting range 104-143 with median 120   Glycemic control:  Lab Results  Component Value Date   HGBA1C 6.3 12/11/2014   HGBA1C 6.3 09/04/2014   HGBA1C 5.7 02/21/2014   Lab Results  Component Value Date   MICROALBUR 1.8 10/16/2014   LDLCALC 63 12/11/2014   CREATININE 1.34 12/11/2014    Self-care: The diet that the patient has been following is: tries to limit fats and sweets  He is usually eating cereal at breakfast or sometimes peanut butter sandwich. Usually eating a sandwich at lunch and meat and salad with beans at dinner. Have snacks with Jell-O. Eating out about twice a week    Exercise: walks in his driveway or active at work         Dietician visit:  years ago. Does have a diet plan at home              Weight history: Highest weight in the past has been 220 and lowest 157  Wt Readings from Last 3 Encounters:  12/16/14 175 lb (79.379 kg)  10/16/14 178 lb (80.74 kg)  09/04/14 177 lb 3.2 oz (80.377 kg)    PROBLEM 2: He came in today for recurrence of his right second toe infection; this was treated with Augmentin last month and  he had also done some local care with his podiatrist He has had some drainage from the ulcer and this has not healed as well as eating some swelling of the right lower leg although less than before.  Has not seen a podiatrist in follow-up      Medication List       This list is accurate as of: 12/16/14  2:49 PM.  Always use your most recent med list.               amoxicillin-clavulanate 875-125 MG per tablet  Commonly known as:  AUGMENTIN  TAKE 1 TABLET BY MOUTH 2 (TWO) TIMES DAILY.     aspirin 81 MG chewable tablet  Chew 81 mg by mouth daily.     cephALEXin 500 MG capsule  Commonly known as:  KEFLEX  Take 1 capsule (500 mg total) by mouth 2 (two) times daily.     furosemide 20 MG tablet  Commonly known as:  LASIX  TAKE 1 TABLET (20 MG TOTAL) BY MOUTH DAILY.     glipiZIDE 2.5 MG 24  hr tablet  Commonly known as:  GLUCOTROL XL  TAKE 1 TABLET (2.5 MG TOTAL) BY MOUTH DAILY WITH BREAKFAST.     losartan 100 MG tablet  Commonly known as:  COZAAR  Take 1 tablet daily     metFORMIN 1000 MG tablet  Commonly known as:  GLUCOPHAGE  Take 1 tablet (1,000 mg total) by mouth 2 (two) times daily with a meal.     ONE TOUCH ULTRA TEST test strip  Generic drug:  glucose blood  USE AS DIRECTED TO TEST 2 TIMES DAILY     ONETOUCH DELICA LANCETS FINE Misc  by Does not apply route 2 (two) times daily.     pantoprazole 40 MG tablet  Commonly known as:  PROTONIX     simvastatin 20 MG tablet  Commonly known as:  ZOCOR  Take 20 mg by mouth every evening.     sitaGLIPtin 100 MG tablet  Commonly known as:  JANUVIA  Take 1 tablet (100 mg total) by mouth daily.        Allergies: No Known Allergies  Past Medical History  Diagnosis Date  . Diabetes mellitus without complication   . Hyperlipemia   . Cellulitis 08/2013    RT LEG  . Dehydration   . Hyperkalemia 08/2013  . Chronic kidney disease 08/2013    ACUTE RENAL   . Anemia   . Hypertension   . Arthritis     KNEES & BACK    Past Surgical History  Procedure Laterality Date  . Back surgery      10 years ago  . Anterior cervical decomp/discectomy fusion N/A 10/17/2012    Procedure: ANTERIOR CERVICAL DECOMPRESSION/DISCECTOMY FUSION 2 LEVELS;  Surgeon: Charlie Pitter, MD;  Location: [facility];  Service: Neurosurgery;  Laterality: N/A;  Cervical three-four, four-five anterior cervical decompression fusion with allograft plating  . Cervical disc surgery      Family History  Problem Relation Age of Onset  . Cancer - Colon Mother   . Cancer - Prostate Father     Social History:  reports that he quit smoking about 17 years ago. He has never used smokeless tobacco. He reports that he does not drink alcohol or use illicit drugs.    Review of Systems       Lipids: treated with Zocor 20 mg       Lab Results  Component  Value Date   CHOL 138 12/11/2014   HDL 64.30 12/11/2014   LDLCALC 63 12/11/2014   LDLDIRECT 48.9 02/21/2014   TRIG 52.0 12/11/2014   CHOLHDL 2 12/11/2014   HYPERTENSION: Currently treated with losartan   He has history of anemia and not sure if that has been evaluated or followed up by PCP   Physical Examination:  BP 128/62 mmHg  Pulse 71  Temp(Src) 97.8 F (36.6 C)  Resp 16  Ht 5\' 8"  (1.727 m)  Wt 175 lb (79.379 kg)  BMI 26.61 kg/m2  SpO2 97%  No active ulcer or cellulitis seen Normal monofilament sensation in his toes  ASSESSMENT:  Right second toe ulcer: He has healing of the ulcer and does not have any signs of sensory loss  Diabetes type 2, normal BMI His blood sugars are excellent with current regimen Since he is concerned about the cost of Januvia he can go back to half tablet a day especially since his creatinine clearance is just over 50 usually He is also fairly compliant with diet; trying to stay active and his weight is stable  Hypertension: His blood pressure is controlled with 100 mg losartan    PLAN:  He will continue his glipizide ER  2.5 mg and metformin along with increasing the Januvia to 100 mg Reminded him to check at least half of his readings after meals  He will schedule follow-up with eye doctor     [facility] 12/16/2014, 2:49 PM   Note: This office note was prepared with Dragon voice recognition system technology. Any transcriptional errors that result from this process are unintentional.

## 2014-12-18 ENCOUNTER — Telehealth: Payer: Self-pay | Admitting: Endocrinology

## 2014-12-18 ENCOUNTER — Other Ambulatory Visit: Payer: Self-pay | Admitting: Endocrinology

## 2014-12-18 LAB — HM DIABETES EYE EXAM

## 2014-12-18 NOTE — Telephone Encounter (Signed)
That was called in this morning.

## 2014-12-18 NOTE — Telephone Encounter (Signed)
Please call in the [place] [facility]

## 2014-12-31 ENCOUNTER — Ambulatory Visit: Payer: BLUE CROSS/BLUE SHIELD | Admitting: *Deleted

## 2014-12-31 DIAGNOSIS — R52 Pain, unspecified: Secondary | ICD-10-CM

## 2015-01-07 NOTE — Progress Notes (Signed)
Patient ID: Jason Mcpherson, male   DOB: Aug 31, 1940, 74 y.o.   MRN: [id number] Patient presents for measurement and scanning for diabetic shoes and inserts

## 2015-01-13 ENCOUNTER — Encounter: Payer: Self-pay | Admitting: Podiatry

## 2015-01-13 ENCOUNTER — Ambulatory Visit (INDEPENDENT_AMBULATORY_CARE_PROVIDER_SITE_OTHER): Payer: BLUE CROSS/BLUE SHIELD | Admitting: Podiatry

## 2015-01-13 VITALS — BP 112/60 | HR 83 | Resp 18

## 2015-01-13 DIAGNOSIS — L97511 Non-pressure chronic ulcer of other part of right foot limited to breakdown of skin: Secondary | ICD-10-CM

## 2015-01-13 DIAGNOSIS — M2041 Other hammer toe(s) (acquired), right foot: Secondary | ICD-10-CM | POA: Diagnosis not present

## 2015-01-14 ENCOUNTER — Encounter: Payer: Self-pay | Admitting: Podiatry

## 2015-01-14 NOTE — Progress Notes (Signed)
Patient ID: Jason Mcpherson, male   DOB: [birth year], 74 y.o.   MRN: [id number]  Subjective: 74 year old male presents to the office today for evaluation of ulceration the distal aspect of the right second digit. He believes that the areas heal in the wound is getting smaller. He has continued to apply antibiotic ointment and a bandage daily.  Denies any drainage or purulence. Denies any surrounding erythema or red streaking. Denies any pain. No other complaints at this time. Denies any systemic complaints as fevers, chills, nausea, vomiting.  Objective: AAO 3, NAD  DP/PT pulses palpable, CRT less than 3 seconds Protective sensation decreased with Simms Weinstein monofilament At the distal aspect of the right second digit there is a hyperkeratotic lesion. Upon debridement there is a superficial wound which almost appears to be an abrasion. The wound is almost healed at this time. There is underlying hammertoe contracture. There is no edema, erythema to the digit. There is no tenderness to palpation upon the wound. No other open lesions or pre-ulcerative lesions identified. There is no pain with calf compression, swelling, warmth, erythema.  Assessment: 74 year old male with healing right second digit ulceration due to underlying hammertoe; no signs of infection at this time.  Plan: -Treatment options discussed including all alternatives, risks, and complications -Wound sharply  debrided to healthy, bleeding, granular tissue. Iodosorb was applied followed by dry sterile dressing. Continue daily dressing changes with antibiotic ointment and a bandage. -Awaiting diabetic shoes.  -Continue offloading pads  -Monitor for any clinical signs or symptoms of infection and directed to call the office immediately should any occur or go to the ER. -Follow-up in 3 weeks or sooner if any problems arise. In the meantime, encouraged to call the office with any questions, concerns, change in symptoms.   Celesta Gentile, DPM

## 2015-02-03 ENCOUNTER — Ambulatory Visit (INDEPENDENT_AMBULATORY_CARE_PROVIDER_SITE_OTHER): Payer: BLUE CROSS/BLUE SHIELD | Admitting: Podiatry

## 2015-02-03 ENCOUNTER — Encounter: Payer: Self-pay | Admitting: Podiatry

## 2015-02-03 DIAGNOSIS — L97511 Non-pressure chronic ulcer of other part of right foot limited to breakdown of skin: Secondary | ICD-10-CM

## 2015-02-03 DIAGNOSIS — M2041 Other hammer toe(s) (acquired), right foot: Secondary | ICD-10-CM

## 2015-02-05 NOTE — Progress Notes (Signed)
Patient ID: ALMAN NULL, male   DOB: 1941-03-08, 74 y.o.   MRN: [id number]  Subjective: 74 year old male presents to the office today for evaluation of ulceration the distal aspect of the right second digit. He continues to state of the wound is healing. He does apply a Band-Aid overlying the area the majority of the time.  He puts antibiotic ointment on it intermittently. Denies any redness or drainage. Denies any swelling to the toe. No other complaints at this time. Denies any systemic complaints as fevers, chills, nausea, vomiting.  Objective: AAO 3, NAD  DP/PT pulses palpable, CRT less than 3 seconds Protective sensation decreased with Simms Weinstein monofilament At the distal aspect of the right second digit there is a hyperkeratotic lesion. Upon debridement there is a superficial wound which almost appears to be an abrasion.measuring 0.2 x  0.1 cm. The wound is almost healed at this time. There is aunderlying hammertoe contracture. There is no edema, erythema to the digit. There is no tenderness to palpation upon the wound. No other open lesions or pre-ulcerative lesions identified. There is no pain with calf compression, swelling, warmth, erythema.  Assessment: 74 year old male with slow healing ulceration to the right 2nd digit.   Plan: -Treatment options discussed including all alternatives, risks, and complications -Wound sharply debrided to healthy, bleeding, granular tissue. Iodosorb was applied followed by dry sterile dressing. Continue daily dressing changes with antibiotic ointment and a bandage. -Diabetic shoes have arrived, but he did not pick them up because he could not afford them at this time. He states he will hold off on getting them at this time until he can pay for them.  -Continue offloading pads  -Monitor for any clinical signs or symptoms of infection and directed to call the office immediately should any occur or go to the ER. -Follow-up in 3-4 weeks or sooner  if any problems arise. In the meantime, encouraged to call the office with any questions, concerns, change in symptoms.   Celesta Gentile, DPM

## 2015-02-24 ENCOUNTER — Encounter: Payer: Self-pay | Admitting: Podiatry

## 2015-02-24 ENCOUNTER — Ambulatory Visit (INDEPENDENT_AMBULATORY_CARE_PROVIDER_SITE_OTHER): Payer: BLUE CROSS/BLUE SHIELD | Admitting: Podiatry

## 2015-02-24 DIAGNOSIS — M2041 Other hammer toe(s) (acquired), right foot: Secondary | ICD-10-CM

## 2015-02-24 DIAGNOSIS — L97511 Non-pressure chronic ulcer of other part of right foot limited to breakdown of skin: Secondary | ICD-10-CM | POA: Diagnosis not present

## 2015-02-24 DIAGNOSIS — B351 Tinea unguium: Secondary | ICD-10-CM

## 2015-02-24 DIAGNOSIS — M79606 Pain in leg, unspecified: Secondary | ICD-10-CM

## 2015-02-24 NOTE — Progress Notes (Signed)
Patient ID: Jason Mcpherson, male   DOB: Jun 10, 1940, 74 y.o.   MRN: [id number]  Subjective: 74 year old male presents the office today for follow-up evaluation of right second toe ulceration. He also presents today for diabetic shoe pickup. He also states his nails are painful elongated Norma Fredrickson hallux and second digit toenails bilaterally. Denies any redness or drainage on the nail sites. He is in situ and the toenail himself. He denies any drainage or redness around the wound on the right toe. He was at the area is healing well he looks that the toe daily. He keeps  antibioticointment and a bandage overlying this foot over the wound if he is walking a lot. He denies any systemic complaints as fevers, chills, nausea, vomiting. No calf pain, chest pain, shortness of breath. No other complaints at this time.  Objective: AAO 3, NAD  DP/PT pulses 2/4, CRT less than 3 seconds Protective sensation appears to be decreased with Derrel Nip monofilament Of the distal aspect of the right second toe is hyperkeratotic lesion. Upon debridement there is a superficial granular wound. It does not probe to bone, undermining or tunneling. There is no surrounding erythema, ascending  cellulitis, fluctuance, crepitus, malodor. There is no drainage or purulence. Nails appear to be hypertrophic, dystrophic, brittle, discolored, elongated on the hallux and second digit toenails. There is no swelling erythema or drainage on the nail sites. There is  Tenderness overlying that hallux and second digit nails. No tenderness the remaining nails. Hammertoe contractures are present. No other open lesions or pre-ulcerative lesions. No pain with calf compression, swelling, warmth, erythema.  Assessment: 74 year old male with right second digit ulceration due to underlying hammertoe contracture, symptomatic onychomycosis  Plan: -Treatment options discussed including all alternatives, risks, and complications -Nails sharply  debrided 4 without, complications/bleeding. - diabetic shoes are dispensed today. However a tenotomy refeeding well that she's reordered. - wound was debrided of hyperkeratotic tissue. The patient is adamant that I cannot remove much of the tissue. After I debrided the area of the wound did not appear to be deep appear to be healing well. There is no signs of infection at this time. Continue daily dressing changes. Monitor for any clinical signs or symptoms of infection and directed to call the office immediately should any occur or go to the ER. -Follow-up in 3-4 weeks or sooner if any problems arise. In the meantime, encouraged to call the office with any questions, concerns, change in symptoms.   Celesta Gentile, DPM

## 2015-03-07 ENCOUNTER — Other Ambulatory Visit: Payer: Self-pay | Admitting: Endocrinology

## 2015-03-29 ENCOUNTER — Other Ambulatory Visit: Payer: Self-pay | Admitting: Endocrinology

## 2015-04-07 ENCOUNTER — Other Ambulatory Visit: Payer: Self-pay | Admitting: Endocrinology

## 2015-04-14 ENCOUNTER — Ambulatory Visit (INDEPENDENT_AMBULATORY_CARE_PROVIDER_SITE_OTHER): Payer: BLUE CROSS/BLUE SHIELD | Admitting: Podiatry

## 2015-04-14 ENCOUNTER — Other Ambulatory Visit (INDEPENDENT_AMBULATORY_CARE_PROVIDER_SITE_OTHER): Payer: BLUE CROSS/BLUE SHIELD

## 2015-04-14 DIAGNOSIS — L97519 Non-pressure chronic ulcer of other part of right foot with unspecified severity: Secondary | ICD-10-CM | POA: Diagnosis not present

## 2015-04-14 DIAGNOSIS — L84 Corns and callosities: Secondary | ICD-10-CM

## 2015-04-14 DIAGNOSIS — M2042 Other hammer toe(s) (acquired), left foot: Secondary | ICD-10-CM

## 2015-04-14 DIAGNOSIS — E1140 Type 2 diabetes mellitus with diabetic neuropathy, unspecified: Secondary | ICD-10-CM

## 2015-04-14 DIAGNOSIS — M2041 Other hammer toe(s) (acquired), right foot: Secondary | ICD-10-CM | POA: Diagnosis not present

## 2015-04-14 DIAGNOSIS — E119 Type 2 diabetes mellitus without complications: Secondary | ICD-10-CM | POA: Diagnosis not present

## 2015-04-14 LAB — BASIC METABOLIC PANEL
BUN: 43 mg/dL — ABNORMAL HIGH (ref 6–23)
CHLORIDE: 107 meq/L (ref 96–112)
CO2: 27 meq/L (ref 19–32)
Calcium: 9.6 mg/dL (ref 8.4–10.5)
Creatinine, Ser: 1.57 mg/dL — ABNORMAL HIGH (ref 0.40–1.50)
GFR: 45.91 mL/min — ABNORMAL LOW (ref >60.00)
Glucose, Bld: 119 mg/dL — ABNORMAL HIGH (ref 70–99)
Potassium: 4.5 mEq/L (ref 3.5–5.1)
SODIUM: 142 meq/L (ref 135–145)

## 2015-04-14 LAB — HEMOGLOBIN A1C: HEMOGLOBIN A1C: 6.7 % — AB (ref 4.6–6.5)

## 2015-04-14 NOTE — Progress Notes (Signed)
Patient ID: Jason Mcpherson, male   DOB: 20-Mar-1941, 74 y.o.   MRN: [id number]   Subjective: 74 year old male presents the office today for follow-up evaluation of right second toe ulceration and to pick up diabetic shoes. He states that he did get a blister overlying the second toe since last appointment he did drainage. He states they were not toe is looking much better and wanted to this last appointment with me. He denies any Lise Auer detail denies any redness or drainage at this time. He does continue with Neosporin 01 area daily the does go quite a bit without any bandage on. He does continue to walk wearing steel toed shoes on concrete floors at work quite a bit. He denies any systemic complaints as fevers, chills, nausea, vomiting. No calf pain, chest pain, shortness of breath. No other complaints at this time.  Objective: AAO 3, NAD  DP/PT pulses 2/4, CRT less than 3 seconds Protective sensation appears to be decreased with Derrel Nip monofilament Of the distal aspect of the right second toe is hyperkeratotic lesion. Upon debridement there is a superficial granular wound. This is due to be much improved her last appointment. There was a small, superficial abrasion-type sort this time after debridement. It does not probe to bone, undermining or tunneling. There is no surrounding erythema, ascending  cellulitis, fluctuance, crepitus, malodor. There is no drainage or purulence. Hammertoe contractures are present. No other open lesions or pre-ulcerative lesions. No pain with calf compression, swelling, warmth, erythema.  Assessment: 74 year old male with resolving right second digit ulceration due to underlying hammertoe contracture, symptomatic onychomycosis  Plan: -Treatment options discussed including all alternatives, risks, and complications -Patient presents for diabetic shoe pick up, shoes are tried on for good fit.  Patient received 1 Pair Apex X801M Lace walker black in men's  size 12 Wide and 3 pairs custom molded diabetic inserts.  Verbal and written break in and wear instructions given.  Patient will follow up for scheduled routine care.  -Keratotic lesion overlying the callus/wound was debrided without complications. Recommended Neosporin and a Band-Aid. Offloading pads. -Monitor for any clinical signs or symptoms of infection and directed to call the office immediately should any occur or go to the ER. -Follow-up in 4 weeks or sooner if any problems arise. In the meantime, encouraged to call the office with any questions, concerns, change in symptoms.   Celesta Gentile, DPM

## 2015-04-17 ENCOUNTER — Ambulatory Visit (INDEPENDENT_AMBULATORY_CARE_PROVIDER_SITE_OTHER): Payer: BLUE CROSS/BLUE SHIELD | Admitting: Endocrinology

## 2015-04-17 ENCOUNTER — Telehealth: Payer: Self-pay | Admitting: Endocrinology

## 2015-04-17 ENCOUNTER — Other Ambulatory Visit: Payer: Self-pay | Admitting: *Deleted

## 2015-04-17 VITALS — BP 128/64 | HR 81 | Temp 98.7°F | Resp 14 | Ht 68.0 in | Wt 176.2 lb

## 2015-04-17 DIAGNOSIS — N183 Chronic kidney disease, stage 3 unspecified: Secondary | ICD-10-CM

## 2015-04-17 DIAGNOSIS — E1142 Type 2 diabetes mellitus with diabetic polyneuropathy: Secondary | ICD-10-CM

## 2015-04-17 DIAGNOSIS — E1165 Type 2 diabetes mellitus with hyperglycemia: Secondary | ICD-10-CM | POA: Diagnosis not present

## 2015-04-17 MED ORDER — SITAGLIPTIN PHOSPHATE 100 MG PO TABS: 100.0000 mg | ORAL_TABLET | Freq: Every day | ORAL | Status: DC

## 2015-04-17 MED ORDER — FUROSEMIDE 20 MG PO TABS: ORAL_TABLET | ORAL | Status: DC

## 2015-04-17 NOTE — Telephone Encounter (Signed)
It was already sent right after he finished with Dr. Dwyane Dee

## 2015-04-17 NOTE — Progress Notes (Signed)
Patient ID: Jason Mcpherson, male   DOB: [birth year], 74 y.o.   MRN: [id number]    Reason for Appointment: follow-up for Type 2 Diabetes  History of Present Illness:          Diagnosis: Type 2 diabetes mellitus, date of diagnosis: 1997       Past history: The patient is unclear about his symptoms at diagnosis as well as initial treatment. Most likely has been on metformin for sometime and at some point was also given glipizide. He is also very unclear about when he was started on insulin and records from his previous endocrinologist are not available; may have been taking this over the last 15 years. Also not clear if he had been on different types of insulin However he was previously significantly overweight His A1c in 9/14 was 5.9 and in 2015 had been about 6.9-7 On his initial consultation he was taking metformin and glipizide ER; also he was taking Levemir irregularly based on his blood sugar level Levemir was stopped on his initial consultation in 6/15  Recent history:   He was on Januvia since 10/29/13 after stopping his Levemir insulin   He is on a regimen of glipizide ER 2.5 mg and metformin 1 g twice a day daily also Since his renal function was adequate he was told to increase his Januvia to 100 mg to help with his postprandial hyperglycemia; he is doing this now and continue to use the 100 mg dose despite higher cost for him  With this regimen his blood sugars A1c has been upper normal, slightly higher at 6.7 compared to 6.3 previously However he has occasional high readings after meals based on his carbohydrate intake Weight has been fairly stable He is relatively active at work but not doing any formal exercise  Hypoglycemia: None          Oral hypoglycemic drugs the patient is taking are: Metformin 1 g twice a day, glipizide ER, 2.5 mg.  Januvia 100 mg      Side effects from medications have been: None  Glucose monitoring:  done less than once a day with One  Touch monitor Blood Glucose readings from meter download recently:  Mean values apply above for all meters except median for One Touch  PRE-MEAL Fasting Lunch Dinner Bedtime Overall  Glucose range:  111-138    109-168   120-199    Mean/median:  122     148   131     Glycemic control:   Lab Results  Component Value Date   HGBA1C 6.7* 04/14/2015   HGBA1C 6.3 12/11/2014   HGBA1C 6.3 09/04/2014   Lab Results  Component Value Date   MICROALBUR 1.8 10/16/2014   LDLCALC 63 12/11/2014   CREATININE 1.57* 04/14/2015    Self-care: The diet that the patient has been following is: tries to limit fats and sweets  He is usually eating cereal at breakfast or sometimes peanut butter sandwich. Usually eating a sandwich at lunch and meat and salad with beans at dinner. Have snacks with Jell-O. Eating out about twice a week     Exercise: walks in his driveway or active at work         Dietician visit:  years ago. Does have a diet plan at home              Weight history: Highest weight in the past has been 220 and lowest 157  Wt Readings from Last 3 Encounters:  04/17/15 176 lb 3.2 oz (79.924 kg)  12/16/14 175 lb (79.379 kg)  10/16/14 178 lb (80.74 kg)    OTHER problems discussed in review of systems      Medication List       This list is accurate as of: 04/17/15  8:46 AM.  Always use your most recent med list.               aspirin 81 MG chewable tablet  Chew 81 mg by mouth daily.     furosemide 20 MG tablet  Commonly known as:  LASIX  TAKE 1 TABLET (20 MG TOTAL) BY MOUTH DAILY.     GLIPIZIDE XL 2.5 MG 24 hr tablet  Generic drug:  glipiZIDE  TAKE 1 TABLET (2.5 MG TOTAL) BY MOUTH DAILY WITH BREAKFAST.     losartan 100 MG tablet  Commonly known as:  COZAAR  TAKE 1 TABLET DAILY     metFORMIN 1000 MG tablet  Commonly known as:  GLUCOPHAGE  Take 1 tablet (1,000 mg total) by mouth 2 (two) times daily with a meal.     ONE TOUCH ULTRA TEST test strip  Generic drug:   glucose blood  USE AS DIRECTED TO TEST 2 TIMES DAILY     ONETOUCH DELICA LANCETS FINE Misc  by Does not apply route 2 (two) times daily.     pantoprazole 40 MG tablet  Commonly known as:  PROTONIX     simvastatin 20 MG tablet  Commonly known as:  ZOCOR  Take 20 mg by mouth every evening.     sitaGLIPtin 100 MG tablet  Commonly known as:  JANUVIA  Take 1 tablet (100 mg total) by mouth daily.     JANUVIA 100 MG tablet  Generic drug:  sitaGLIPtin  TAKE 1 TABLET BY MOUTH DAILY.        Allergies: No Known Allergies  Past Medical History  Diagnosis Date  . Diabetes mellitus without complication ([place])   . Hyperlipemia   . Cellulitis 08/2013    RT LEG  . Dehydration   . Hyperkalemia 08/2013  . Chronic kidney disease 08/2013    ACUTE RENAL   . Anemia   . Hypertension   . Arthritis     KNEES & BACK    Past Surgical History  Procedure Laterality Date  . Back surgery      10 years ago  . Anterior cervical decomp/discectomy fusion N/A 10/17/2012    Procedure: ANTERIOR CERVICAL DECOMPRESSION/DISCECTOMY FUSION 2 LEVELS;  Surgeon: Charlie Pitter, MD;  Location: [facility];  Service: Neurosurgery;  Laterality: N/A;  Cervical three-four, four-five anterior cervical decompression fusion with allograft plating  . Cervical disc surgery      Family History  Problem Relation Age of Onset  . Cancer - Colon Mother   . Cancer - Prostate Father     Social History:  reports that he quit smoking about 17 years ago. He has never used smokeless tobacco. He reports that he does not drink alcohol or use illicit drugs.    Review of Systems   No recent problems with his foot ulcer or cellulitis, asking about a rash on the left lower leg  RENAL dysfunction: His creatinine is higher than usual, usually takes Tylenol for his back pain and not taking Advil or Aleve usually  Lab Results  Component Value Date   CREATININE 1.57* 04/14/2015   CREATININE 1.34 12/11/2014   CREATININE 1.44  09/04/2014   CREATININE 1.3 02/21/2014  Lipids: treated with Zocor 20 mg       Lab Results  Component Value Date   CHOL 138 12/11/2014   HDL 64.30 12/11/2014   LDLCALC 63 12/11/2014   LDLDIRECT 48.9 02/21/2014   TRIG 52.0 12/11/2014   CHOLHDL 2 12/11/2014   HYPERTENSION: Currently treated with losartan 100 mg daily, last reading at home about 126/62   He is also taking Lasix which was started because of his edema but he has not had any now  He has history of anemia and not sure if that has been evaluated or followed up by PCP   Physical Examination:  Ht 5\' 8"  (1.727 m)  Wt 176 lb 3.2 oz (79.924 kg)  BMI 26.80 kg/m2  No foot ulcer or cellulitis seen Intact monofilament sensation in his toes Has small area of eczematous scaling/erythema on the back of the left ankle  ASSESSMENT:   Diabetes type 2, normal BMI His blood sugars are fairly good with current regimen Occasionally has postprandial hyperglycemia and will continue his Januvia at 100 mg for now Also continue metformin unchanged as he is tolerating this well He is also fairly compliant with diet; trying to stay active and his weight is stable  Hypertension: His blood pressure is controlled with 100 mg losartan but may be relatively low considering his age History of microalbuminuria not present  RENAL dysfunction: This may be related to his blood pressure below normal and also continuing to take Lasix and losartan    PLAN:  He will continue his glipizide ER  2.5 mg and metformin along with the Januvia  100 mg May need to reduce metformin by 500 mg if creatinine continues to be high  He will reduce his losartan to half tablet and continue to monitor his blood pressure at home Also he can take Lasix as needed only  He will schedule follow-up with PCP, will need follow-up of renal function     Jason Mcpherson 04/17/2015, 8:46 AM   Note: This office note was prepared with Dragon voice recognition system  technology. Any transcriptional errors that result from this process are unintentional.

## 2015-04-17 NOTE — Patient Instructions (Addendum)
Losartan 1/2 tab daily  Check blood sugars on waking up 2  times a week Also check blood sugars about 2 hours after a meal and do this after different meals by rotation  Recommended blood sugar levels on waking up is 90-130 and about 2 hours after meal is 130-160  Please bring your blood sugar monitor to each visit, thank you  Januvia 1 tab daily  Fluid pill as needed only

## 2015-04-17 NOTE — Telephone Encounter (Signed)
Patient would like to have a refill on his medication   Rx: Fluid Pill   Pharmacy: [facility] [place]    Thank you

## 2015-04-18 ENCOUNTER — Ambulatory Visit: Payer: BLUE CROSS/BLUE SHIELD | Admitting: Endocrinology

## 2015-04-18 ENCOUNTER — Other Ambulatory Visit: Payer: Self-pay | Admitting: Endocrinology

## 2015-07-14 ENCOUNTER — Other Ambulatory Visit: Payer: BLUE CROSS/BLUE SHIELD

## 2015-07-17 ENCOUNTER — Ambulatory Visit: Payer: BLUE CROSS/BLUE SHIELD | Admitting: Endocrinology

## 2015-07-23 ENCOUNTER — Telehealth: Payer: Self-pay | Admitting: Endocrinology

## 2015-07-23 MED ORDER — SITAGLIPTIN PHOSPHATE 100 MG PO TABS: 100.0000 mg | ORAL_TABLET | Freq: Every day | ORAL | Status: DC

## 2015-07-23 NOTE — Telephone Encounter (Signed)
rx sent

## 2015-07-23 NOTE — Telephone Encounter (Signed)
Pt needs a 90 days supply of Januvia sent into [facility] in [place]

## 2015-07-25 IMAGING — US US EXTREM LOW VENOUS*R*
1 series · 13 of 24 positions shown · non-contrast
Comparison: None.

CLINICAL DATA: Patient complains of redness and warmth for 1 day.
Streaking and redness throughout the leg. The patient denies pain.



[Series 1: us extrem low venous*right* · 13 of 37 slices shown]
[im 1/37]
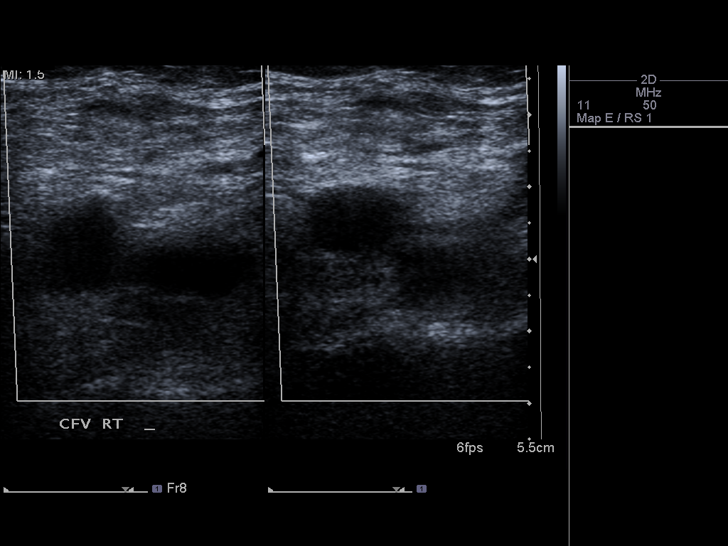
[im 4/37]
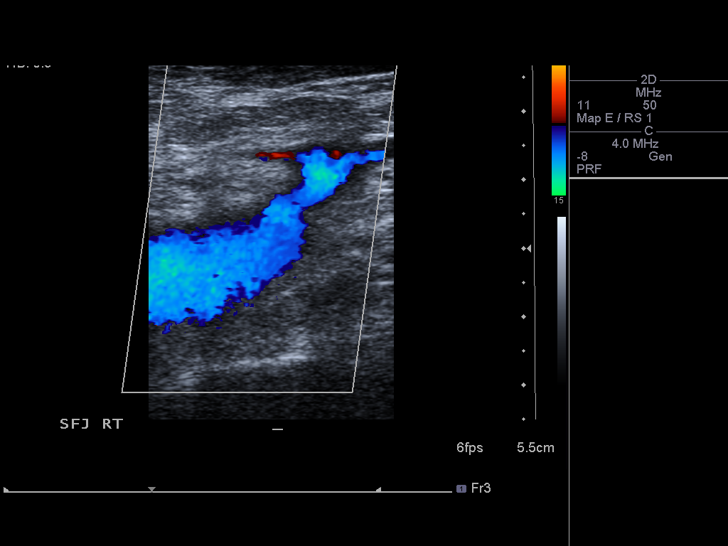
[im 7/37]
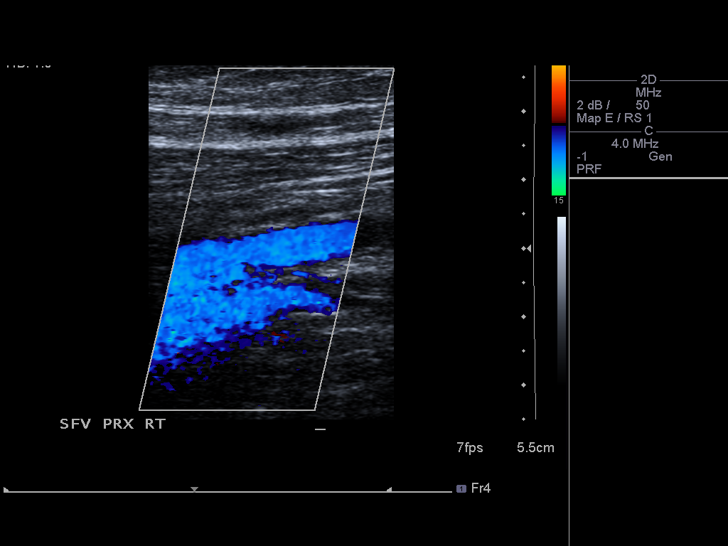
[im 10/37]
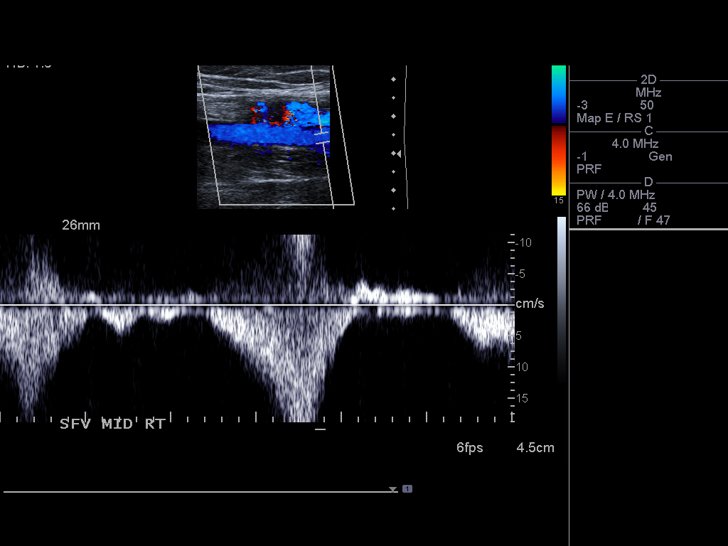
[im 13/37]
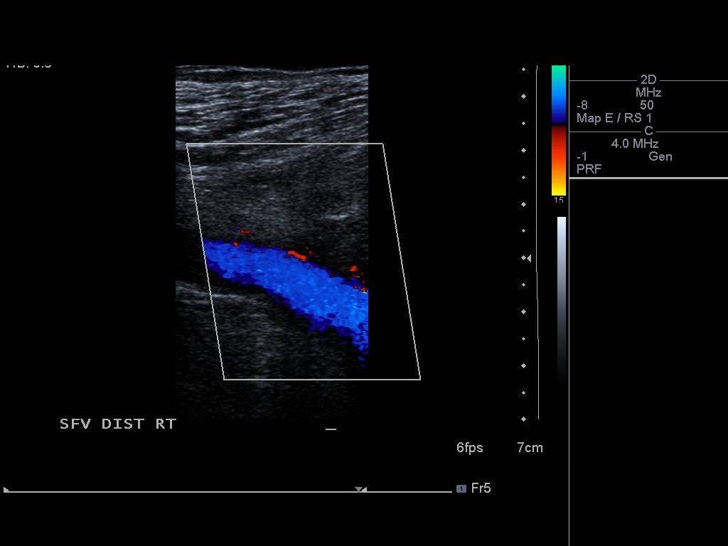
[im 16/37]
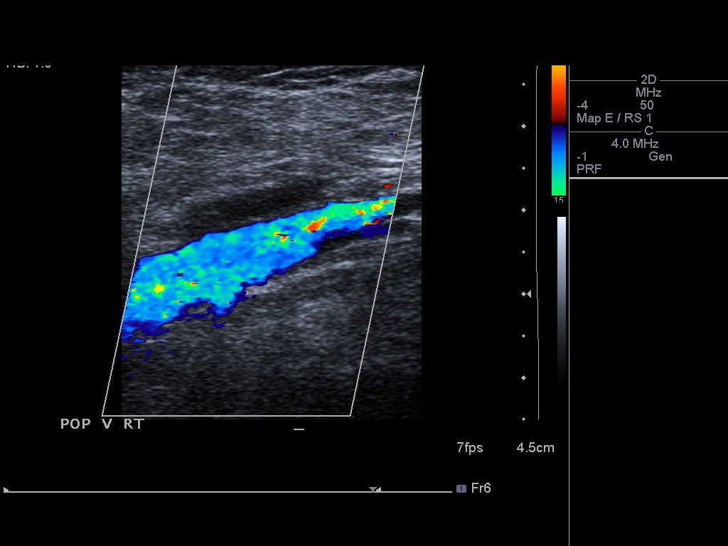
[im 19/37]
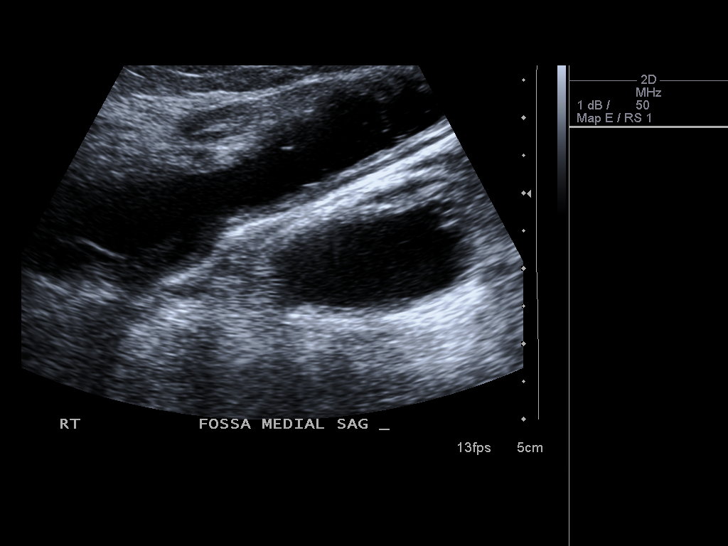
[im 21/37]
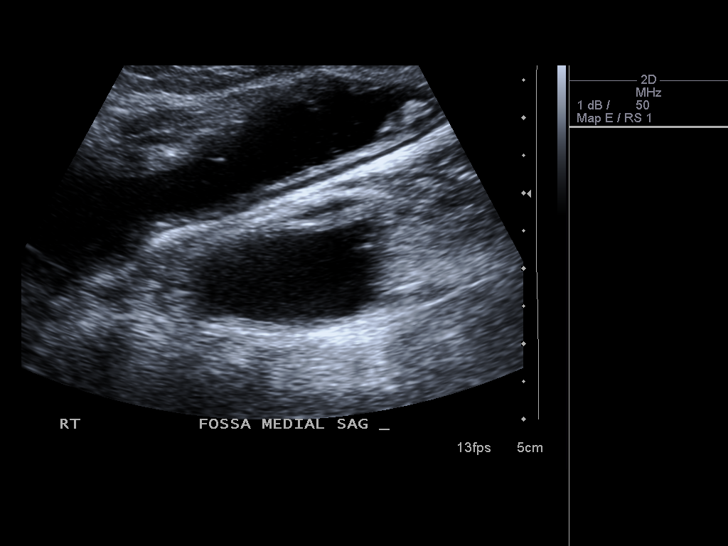
[im 24/37]
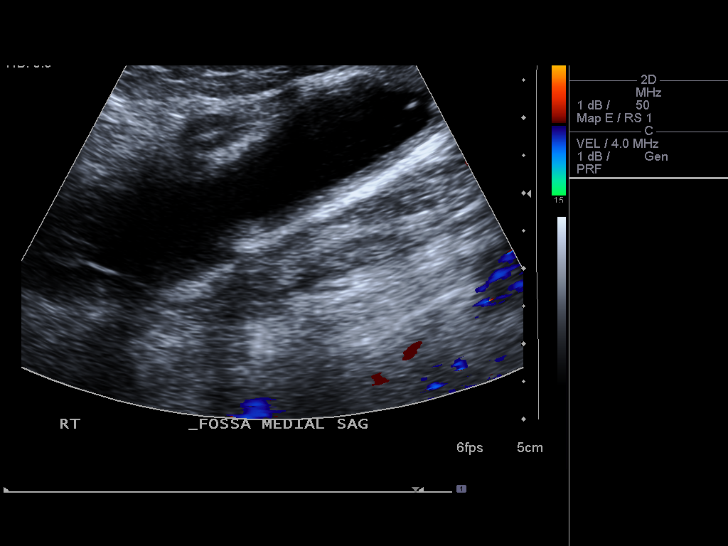
[im 27/37]
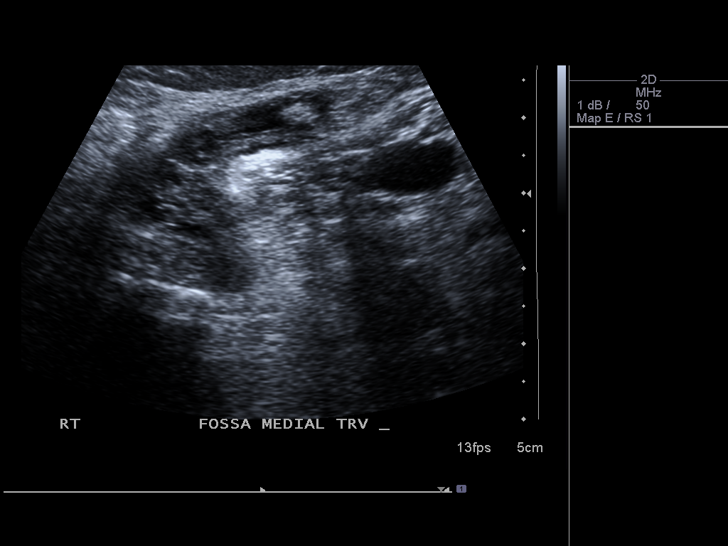
[im 30/37]
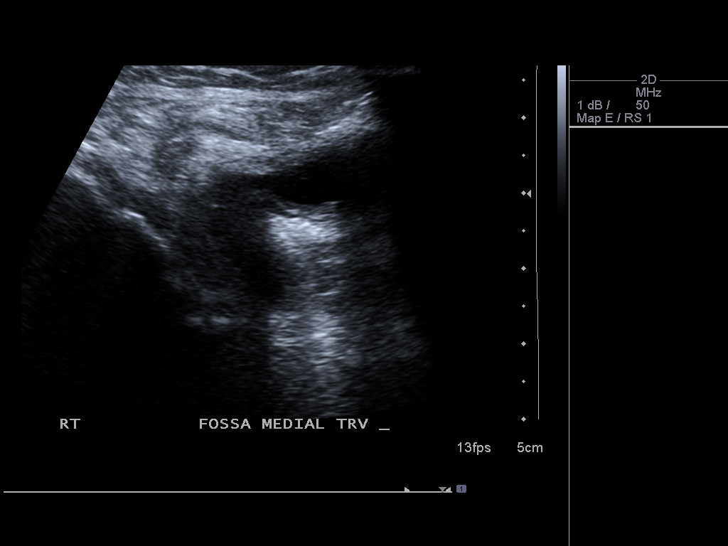
[im 33/37]
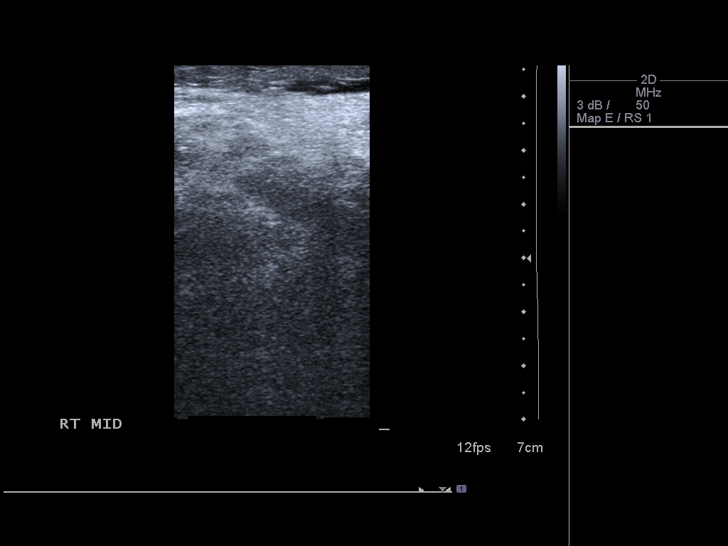
[im 37/37]
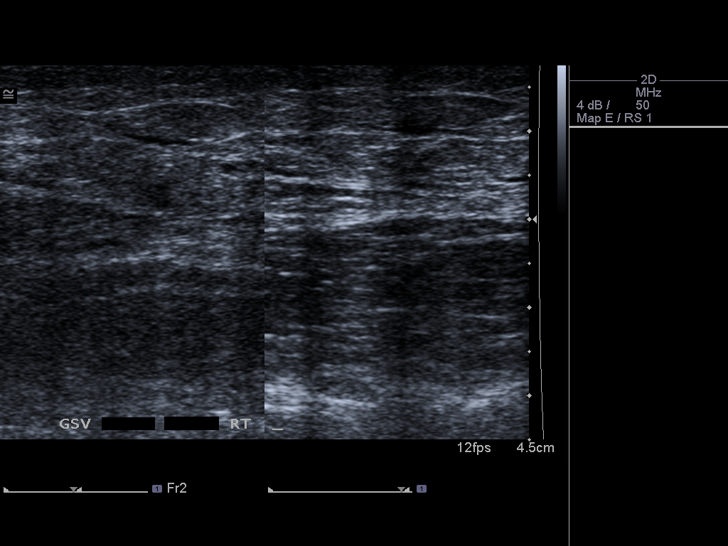

[13 of 24 positions shown; findings below may reference images not displayed]

FINDINGS: Common Femoral Vein: No evidence of thrombus. Normal
compressibility, respiratory phasicity and response to augmentation.

Saphenofemoral Junction: No evidence of thrombus. Normal
compressibility and flow on color Doppler imaging.

Profunda Femoral Vein: No evidence of thrombus. Normal
compressibility and flow on color Doppler imaging.

Femoral Vein: No evidence of thrombus. Normal compressibility,
respiratory phasicity and response to augmentation.

Popliteal Vein: No evidence of thrombus. Normal compressibility,
respiratory phasicity and response to augmentation.

Calf Veins: Limited evaluation.

Superficial Great Saphenous Vein: No evidence of thrombus. Normal
compressibility and flow on color Doppler imaging.

Other Findings: Multiple varicosities are identified within the
calf, none identified containing clots. Complex fluid collection is
identified in the right popliteal fossa region measuring 6.8 x 1.7 x
2.9 centimeters.
IMPRESSION: 1. No evidence for occlusive deep venous thrombosis.
2. Calf varicosities.
3. Popliteal fossa fluid collection.

## 2015-07-25 IMAGING — CR DG TIBIA/FIBULA 2V*R*
2 series · 2 of 2 positions shown · non-contrast
Comparison: None.

CLINICAL DATA: Evaluate for osteomyelitis.

EXAM:
RIGHT TIBIA AND FIBULA - 2 VIEW

[view not recorded (1 of 2)]
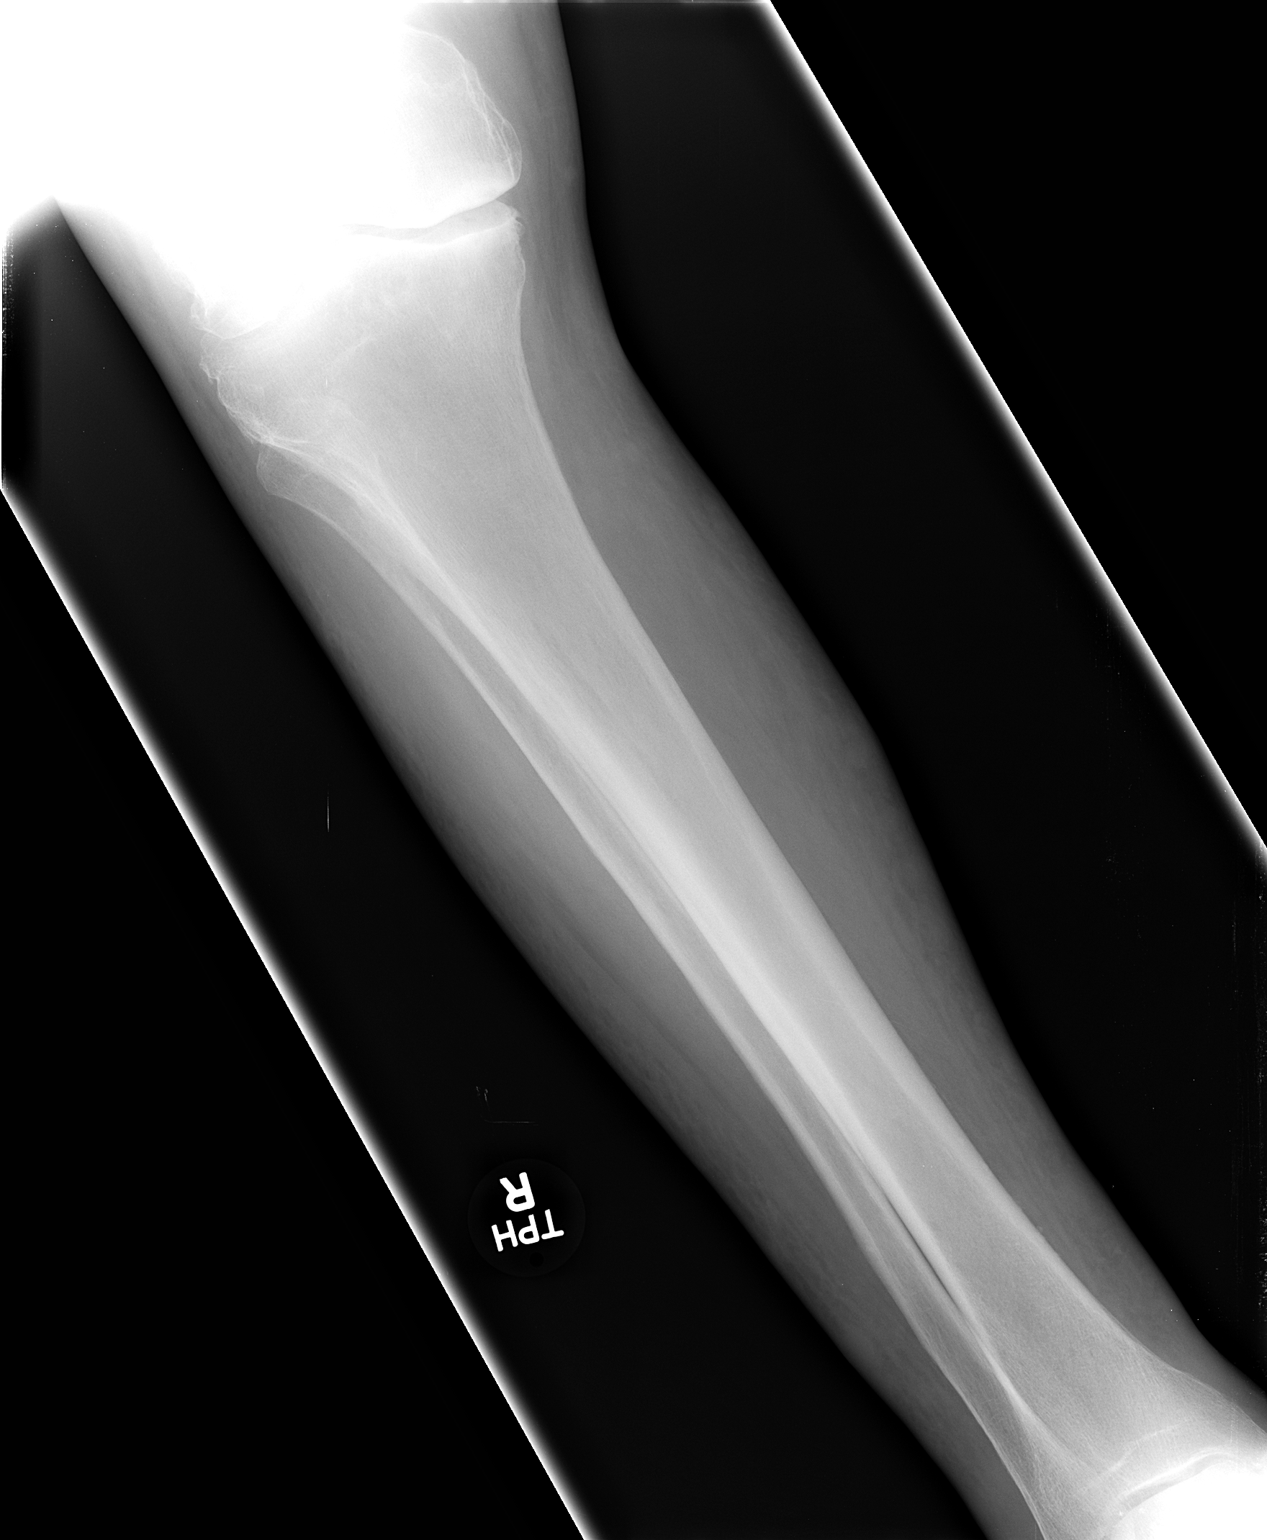

[view not recorded (2 of 2)]
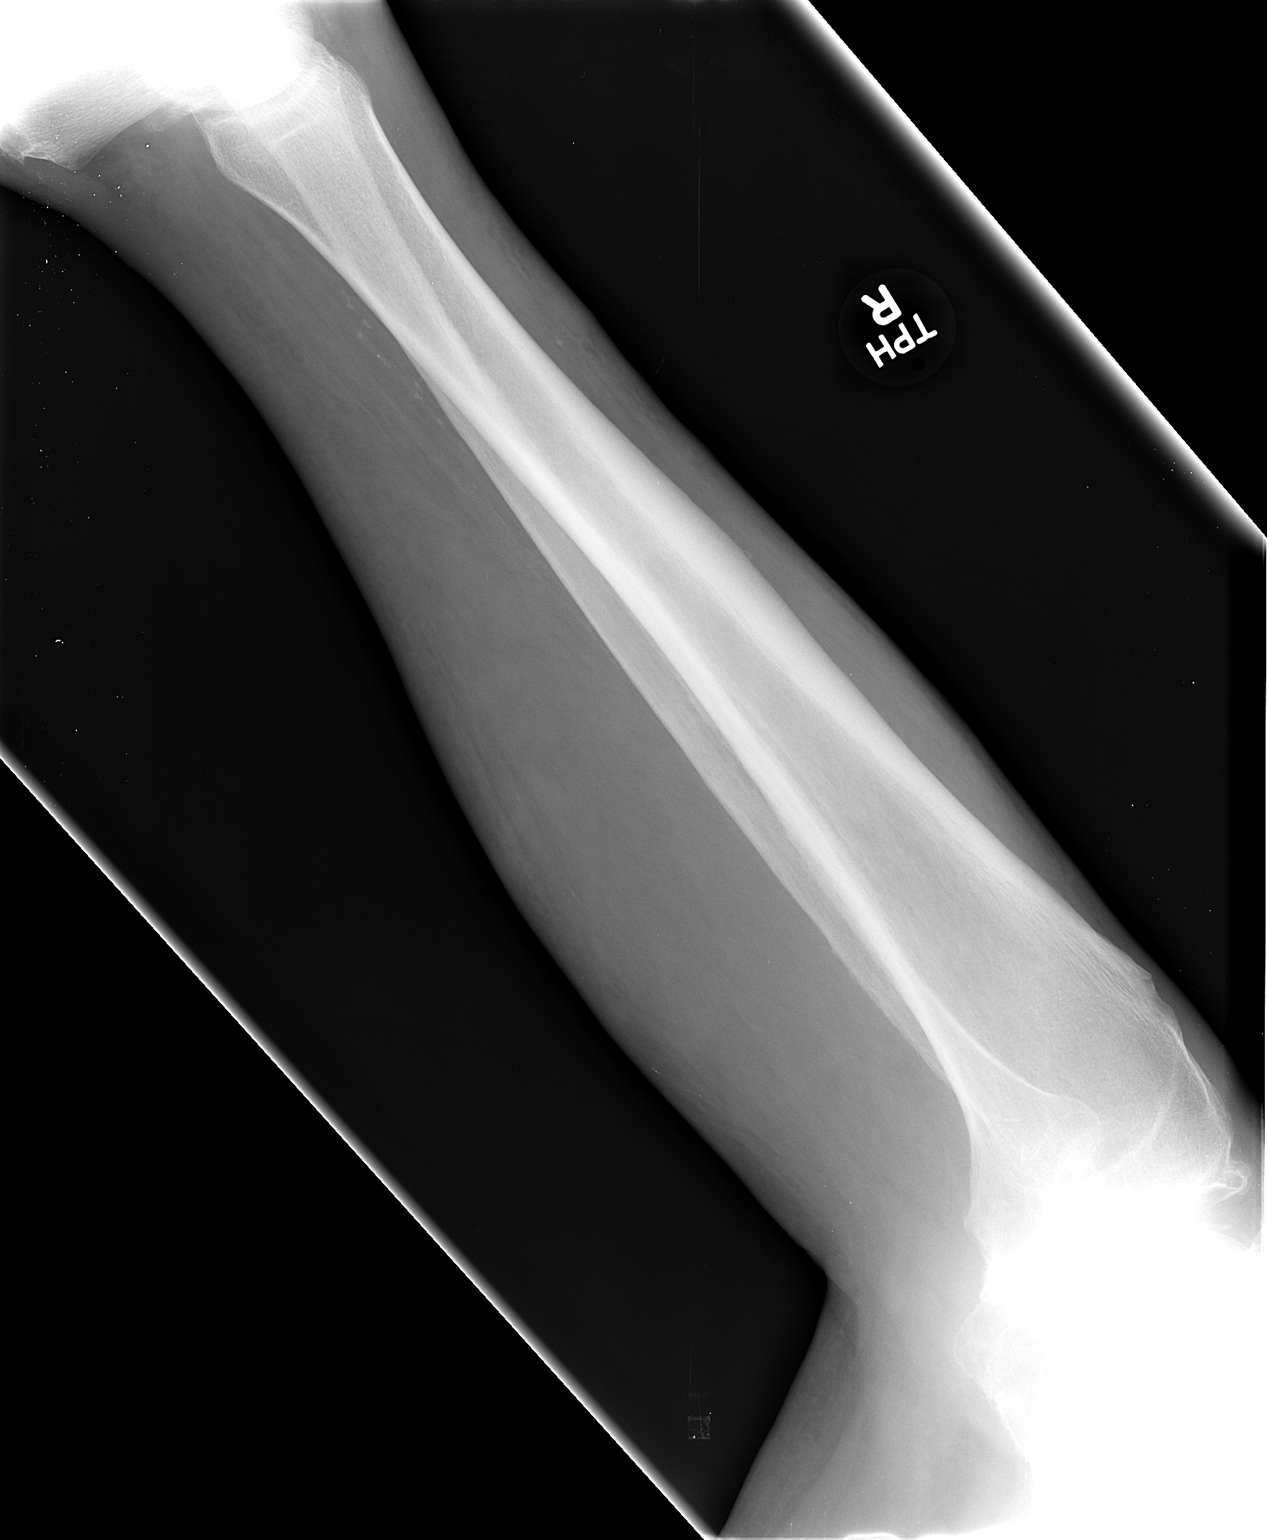

[2 of 2 positions shown; findings below may reference images not displayed]

FINDINGS: There is no evidence of fracture or other focal bone lesions. Soft
tissues are unremarkable. Joint space narrowing and degenerative
spurring within the right knee. No radiographic changes of
osteomyelitis.
IMPRESSION: No acute bony abnormality.

## 2015-09-03 ENCOUNTER — Other Ambulatory Visit: Payer: Self-pay | Admitting: Endocrinology

## 2015-10-10 ENCOUNTER — Other Ambulatory Visit: Payer: Self-pay | Admitting: Endocrinology

## 2015-10-15 ENCOUNTER — Other Ambulatory Visit (INDEPENDENT_AMBULATORY_CARE_PROVIDER_SITE_OTHER): Payer: PPO

## 2015-10-15 ENCOUNTER — Telehealth: Payer: Self-pay | Admitting: Endocrinology

## 2015-10-15 DIAGNOSIS — E1165 Type 2 diabetes mellitus with hyperglycemia: Secondary | ICD-10-CM | POA: Diagnosis not present

## 2015-10-15 LAB — COMPREHENSIVE METABOLIC PANEL
ALBUMIN: 4.3 g/dL (ref 3.5–5.2)
ALK PHOS: 67 U/L (ref 39–117)
ALT: 11 U/L (ref 0–53)
AST: 14 U/L (ref 0–37)
BUN: 56 mg/dL — AB (ref 6–23)
CO2: 26 mEq/L (ref 19–32)
CREATININE: 1.82 mg/dL — AB (ref 0.40–1.50)
Calcium: 9.9 mg/dL (ref 8.4–10.5)
Chloride: 106 mEq/L (ref 96–112)
GFR: 38.66 mL/min — ABNORMAL LOW (ref >60.00)
Glucose, Bld: 170 mg/dL — ABNORMAL HIGH (ref 70–99)
Potassium: 4.6 mEq/L (ref 3.5–5.1)
SODIUM: 140 meq/L (ref 135–145)
TOTAL PROTEIN: 7.2 g/dL (ref 6.0–8.3)
Total Bilirubin: 0.7 mg/dL (ref 0.2–1.2)

## 2015-10-15 LAB — HEMOGLOBIN A1C: Hgb A1c MFr Bld: 7.5 % — ABNORMAL HIGH (ref 4.6–6.5)

## 2015-10-15 MED ORDER — GLIPIZIDE ER 2.5 MG PO TB24: ORAL_TABLET | ORAL | Status: DC

## 2015-10-15 NOTE — Telephone Encounter (Signed)
PT said that he needs his Glipizide refilled to [facility] [place].  He is completely out.

## 2015-10-15 NOTE — Telephone Encounter (Signed)
Rx submitted

## 2015-10-20 ENCOUNTER — Encounter: Payer: Self-pay | Admitting: Endocrinology

## 2015-10-20 ENCOUNTER — Other Ambulatory Visit: Payer: Self-pay

## 2015-10-20 ENCOUNTER — Ambulatory Visit (INDEPENDENT_AMBULATORY_CARE_PROVIDER_SITE_OTHER): Payer: PPO | Admitting: Endocrinology

## 2015-10-20 VITALS — BP 136/64 | HR 88 | Ht 68.0 in | Wt 176.0 lb

## 2015-10-20 DIAGNOSIS — N289 Disorder of kidney and ureter, unspecified: Secondary | ICD-10-CM

## 2015-10-20 DIAGNOSIS — E1165 Type 2 diabetes mellitus with hyperglycemia: Secondary | ICD-10-CM

## 2015-10-20 DIAGNOSIS — L03116 Cellulitis of left lower limb: Secondary | ICD-10-CM

## 2015-10-20 LAB — URINALYSIS, ROUTINE W REFLEX MICROSCOPIC
Bilirubin Urine: NEGATIVE
HGB URINE DIPSTICK: NEGATIVE
KETONES UR: NEGATIVE
LEUKOCYTES UA: NEGATIVE
NITRITE: NEGATIVE
RBC / HPF: NONE SEEN (ref 0–?)
Specific Gravity, Urine: 1.01 (ref 1.000–1.030)
TOTAL PROTEIN, URINE-UPE24: NEGATIVE
URINE GLUCOSE: NEGATIVE
UROBILINOGEN UA: 0.2 (ref 0.0–1.0)
pH: 5.5 (ref 5.0–8.0)

## 2015-10-20 LAB — MICROALBUMIN / CREATININE URINE RATIO
Creatinine,U: 82.7 mg/dL
MICROALB/CREAT RATIO: 2.2 mg/g (ref 0.0–30.0)
Microalb, Ur: 1.8 mg/dL (ref 0.0–1.9)

## 2015-10-20 MED ORDER — INSULIN PEN NEEDLE 32G X 4 MM MISC: Status: DC

## 2015-10-20 MED ORDER — CEPHALEXIN 500 MG PO CAPS: 500.0000 mg | ORAL_CAPSULE | Freq: Three times a day (TID) | ORAL | Status: DC

## 2015-10-20 MED ORDER — INSULIN DETEMIR 100 UNIT/ML FLEXPEN: 16.0000 [IU] | PEN_INJECTOR | Freq: Every day | SUBCUTANEOUS | Status: DC

## 2015-10-20 MED ORDER — SITAGLIPTIN PHOSPHATE 100 MG PO TABS: 100.0000 mg | ORAL_TABLET | Freq: Every day | ORAL | Status: DC

## 2015-10-20 NOTE — Progress Notes (Signed)
Patient ID: Jason Mcpherson, male   DOB: 10-02-1940, 75 y.o.   MRN: [id number]    Reason for Appointment: follow-up for Type 2 Diabetes  History of Present Illness:          Diagnosis: Type 2 diabetes mellitus, date of diagnosis: 1997       Past history: The patient is unclear about his symptoms at diagnosis as well as initial treatment. Most likely has been on metformin for sometime and at some point was also given glipizide. He is also very unclear about when he was started on insulin and records from his previous endocrinologist are not available; may have been taking this over the last 15 years. Also not clear if he had been on different types of insulin However he was previously significantly overweight His A1c in 9/14 was 5.9 and in 2015 had been about 6.9-7 On his initial consultation he was taking metformin and glipizide ER; also he was taking Levemir irregularly based on his blood sugar level Levemir was stopped on his initial consultation in 6/15  Recent history:         Oral hypoglycemic drugs the patient is taking are: Metformin 1 g twice a day, glipizide ER, 2.5 mg.  Januvia 100 mg    He has not been seen in follow-up since 12/16  He was started on Januvia since 10/29/13 after stopping his Levemir insulin   His A1c has gone up to 7.5, previously as low as 6.3  Current management and blood sugar patterns as well as problems:  His blood sugars looked much higher than before and he thinks they only started going up last month after he retired  He has not changed his medication regimen, diet or any decrease in activity level  Blood sugars appear to be consistently high both morning and after supper although not always high in the afternoon, has only rare good readings after supper  Hypoglycemia: None      Side effects from medications have been: None  Glucose monitoring:  done less than once a day with One Touch monitor Blood Glucose readings from meter  download recently:  Mean values apply above for all meters except median for One Touch  PRE-MEAL Fasting Lunch Dinner Bedtime Overall  Glucose range: 175-209   103-269  141-227    Mean/median: 197    183 183    Glycemic control:   Lab Results  Component Value Date   HGBA1C 7.5* 10/15/2015   HGBA1C 6.7* 04/14/2015   HGBA1C 6.3 12/11/2014   Lab Results  Component Value Date   MICROALBUR 1.8 10/20/2015   LDLCALC 63 12/11/2014   CREATININE 1.82* 10/15/2015    Self-care: The diet that the patient has been following is: tries to limit fats and sweets  He is usually eating cereal at breakfast or sometimes peanut butter sandwich. Usually eating a sandwich at lunch and meat and salad with beans at dinner. Have snacks with Jell-O. Eating out about twice a week  Supper at5 PM usually     Exercise: walks in his driveway or stores     Dietician visit:  years ago. Does have a diet plan at home              Weight history: Highest weight in the past has been 220 and lowest 157  Wt Readings from Last 3 Encounters:  10/20/15 176 lb (79.833 kg)  04/17/15 176 lb 3.2 oz (79.924 kg)  12/16/14 175 lb (79.379 kg)  OTHER problems discussed in review of systems      Medication List       This list is accurate as of: 10/20/15  1:59 PM.  Always use your most recent med list.               aspirin 81 MG chewable tablet  Chew 81 mg by mouth daily.     cephALEXin 500 MG capsule  Commonly known as:  KEFLEX  Take 1 capsule (500 mg total) by mouth 3 (three) times daily.     furosemide 20 MG tablet  Commonly known as:  LASIX  TAKE 1 TABLET BY MOUTH DAILY.     Insulin Detemir 100 UNIT/ML Pen  Commonly known as:  LEVEMIR  Inject 16 Units into the skin daily at 10 pm.     losartan 100 MG tablet  Commonly known as:  COZAAR  TAKE 1 TABLET DAILY     metFORMIN 1000 MG tablet  Commonly known as:  GLUCOPHAGE  Take 1 tablet (1,000 mg total) by mouth 2 (two) times daily with a meal.      ONE TOUCH ULTRA TEST test strip  Generic drug:  glucose blood  USE AS DIRECTED TO TEST 2 TIMES DAILY     ONETOUCH DELICA LANCETS FINE Misc  by Does not apply route 2 (two) times daily.     pantoprazole 40 MG tablet  Commonly known as:  PROTONIX     simvastatin 20 MG tablet  Commonly known as:  ZOCOR  Take 20 mg by mouth every evening.     sitaGLIPtin 100 MG tablet  Commonly known as:  JANUVIA  Take 1 tablet (100 mg total) by mouth daily.        Allergies: No Known Allergies  Past Medical History  Diagnosis Date  . Diabetes mellitus without complication ([place])   . Hyperlipemia   . Cellulitis 08/2013    RT LEG  . Dehydration   . Hyperkalemia 08/2013  . Chronic kidney disease 08/2013    ACUTE RENAL   . Anemia   . Hypertension   . Arthritis     KNEES & BACK    Past Surgical History  Procedure Laterality Date  . Back surgery      10 years ago  . Anterior cervical decomp/discectomy fusion N/A 10/17/2012    Procedure: ANTERIOR CERVICAL DECOMPRESSION/DISCECTOMY FUSION 2 LEVELS;  Surgeon: Charlie Pitter, MD;  Location: [facility];  Service: Neurosurgery;  Laterality: N/A;  Cervical three-four, four-five anterior cervical decompression fusion with allograft plating  . Cervical disc surgery      Family History  Problem Relation Age of Onset  . Cancer - Colon Mother   . Cancer - Prostate Father     Social History:  reports that he quit smoking about 18 years ago. He has never used smokeless tobacco. He reports that he does not drink alcohol or use illicit drugs.    Review of Systems   Again asking about a rash on the left lower leg but also is getting more swelling, he is trying to use a topical antibiotic  RENAL dysfunction: His creatinine is higher than usual, usually takes Tylenol for his back pain  He has increased his Lasix to 2 tablets daily because of swelling of the left leg   Lab Results  Component Value Date   CREATININE 1.82* 10/15/2015   CREATININE  1.57* 04/14/2015   CREATININE 1.34 12/11/2014   CREATININE 1.44 09/04/2014  Lipids: treated with Zocor 20 mg       Lab Results  Component Value Date   CHOL 138 12/11/2014   HDL 64.30 12/11/2014   LDLCALC 63 12/11/2014   LDLDIRECT 48.9 02/21/2014   TRIG 52.0 12/11/2014   CHOLHDL 2 12/11/2014   HYPERTENSION: Currently treated with losartan 100 mg daily   He is also taking Lasix which was started because of his edema but he has taken 2 tablets daily because of swelling of the left leg  He has history of anemia    Physical Examination:  BP 136/64 mmHg  Pulse 88  Ht 5\' 8"  (1.727 m)  Wt 176 lb (79.833 kg)  BMI 26.77 kg/m2  SpO2 97%  No foot ulcer, healed ulcer on the right second toe plantar surfaces He has warmth of the medial left leg indicated above cellulitis  Mild tenderness of the left calf but home and sign is negative Has  area of eczematous scaling/erythema on the lower left leg  ASSESSMENT:   Diabetes type 2, normal BMI His blood sugars are significantly higher since his last visit without any change in his regimen, diet or compliance with medications Since he cannot continue the full dose of metformin and blood sugars are significantly higher despite taking Januvia and glipizide he probably needs to be on insulin  Probable cellulitis of left leg with reactive edema  Hypertension: His blood pressure is controlled with 100 mg losartan but may be relatively low considering his age History of microalbuminuria not present  RENAL dysfunction: This is somewhat worse for no apparent reason Although he is taking more Lasix than he showed he does not have a low blood pressure Needs nephrology consultation   PLAN:   Levemir 16 units at suppertime  Given titration schedule to increase the dose by 2 units every 3 days until morning sugars are under 130  Reduce metformin to only one tablet in the evening daily  Stop glipizide  Take half of the 100 mg  Januvia daily  Cephalexin for cellulitis for 7 days  Nephrology consultation  He will schedule follow-up with PCP for reassessment of his cellulitis as well as general medical needs  Total visit time = 25 minute including counseling    Jissell Trafton 10/20/2015, 1:59 PM   Note: This office note was prepared with Dragon voice recognition system technology. Any transcriptional errors that result from this process are unintentional.

## 2015-10-20 NOTE — Patient Instructions (Addendum)
Take only 1/2 Januvia   Stop Glipizide  Levemir insulin: This insulin provides blood sugar control for up to 24 hours.    Start with 16 units at supper daily and increase by 2 units every 3 days until the waking up sugars are under 130.  Then continue the same dose.  If blood sugar is under 90 for 2 days in a row, reduce the dose by 2 units. Note that this insulin does not control the rise of blood sugar with meals    Fluid pill 1/ day

## 2015-11-18 DIAGNOSIS — I10 Essential (primary) hypertension: Secondary | ICD-10-CM | POA: Diagnosis not present

## 2015-11-18 DIAGNOSIS — L03116 Cellulitis of left lower limb: Secondary | ICD-10-CM | POA: Diagnosis not present

## 2015-11-18 DIAGNOSIS — E1122 Type 2 diabetes mellitus with diabetic chronic kidney disease: Secondary | ICD-10-CM | POA: Diagnosis not present

## 2015-11-18 DIAGNOSIS — E7800 Pure hypercholesterolemia, unspecified: Secondary | ICD-10-CM | POA: Diagnosis not present

## 2015-11-18 DIAGNOSIS — Z7984 Long term (current) use of oral hypoglycemic drugs: Secondary | ICD-10-CM | POA: Diagnosis not present

## 2015-11-18 DIAGNOSIS — Z1389 Encounter for screening for other disorder: Secondary | ICD-10-CM | POA: Diagnosis not present

## 2015-11-18 DIAGNOSIS — Z0001 Encounter for general adult medical examination with abnormal findings: Secondary | ICD-10-CM | POA: Diagnosis not present

## 2015-11-18 DIAGNOSIS — R21 Rash and other nonspecific skin eruption: Secondary | ICD-10-CM | POA: Diagnosis not present

## 2015-11-18 DIAGNOSIS — N183 Chronic kidney disease, stage 3 (moderate): Secondary | ICD-10-CM | POA: Diagnosis not present

## 2015-11-24 ENCOUNTER — Telehealth: Payer: Self-pay | Admitting: Internal Medicine

## 2015-11-24 ENCOUNTER — Ambulatory Visit (INDEPENDENT_AMBULATORY_CARE_PROVIDER_SITE_OTHER): Payer: PPO | Admitting: Endocrinology

## 2015-11-24 ENCOUNTER — Other Ambulatory Visit: Payer: Self-pay

## 2015-11-24 ENCOUNTER — Encounter: Payer: Self-pay | Admitting: Endocrinology

## 2015-11-24 VITALS — BP 132/62 | HR 89 | Wt 179.0 lb

## 2015-11-24 DIAGNOSIS — E1165 Type 2 diabetes mellitus with hyperglycemia: Secondary | ICD-10-CM

## 2015-11-24 MED ORDER — ONETOUCH DELICA LANCETS FINE MISC: 3 refills | Status: DC

## 2015-11-24 NOTE — Telephone Encounter (Signed)
Patient needs a prescription filled for lancets.  His previous prescription was through another doctor.

## 2015-11-24 NOTE — Patient Instructions (Signed)
Check sugar at about 7-8pm and if stays >180 call to start fast insulin  Check blood sugars on waking up   times a week Also check blood sugars about 2 hours after a meal and do this after different meals by rotation  Recommended blood sugar levels on waking up is 90-130 and about 2 hours after meal is 130-160  Please bring your blood sugar monitor to each visit, thank you

## 2015-11-24 NOTE — Progress Notes (Signed)
Patient ID: Jason Mcpherson, male   DOB: 10-13-1940, 75 y.o.   MRN: [id number]    Reason for Appointment: follow-up for Type 2 Diabetes  History of Present Illness:          Diagnosis: Type 2 diabetes mellitus, date of diagnosis: 1997       Past history: The patient is unclear about his symptoms at diagnosis as well as initial treatment. Most likely has been on metformin for sometime and at some point was also given glipizide. He is also very unclear about when he was started on insulin and records from his previous endocrinologist are not available; may have been taking this over the last 15 years. Also not clear if he had been on different types of insulin However he was previously significantly overweight His A1c in 9/14 was 5.9 and in 2015 had been about 6.9-7 On his initial consultation he was taking metformin and glipizide ER; also he was taking Levemir irregularly based on his blood sugar level Levemir was stopped on his initial consultation in 6/15 He was started on Januvia since 10/29/13 after stopping his Levemir insulin   Recent history:          INSULIN regimen = Levemir 18 units at bedtime  Oral hypoglycemic drugs the patient is taking are: Metformin 1500 mg daily,   Januvia 100 mg     His A1c in 6/17 had gone up to 7.5, previously as low as 6.3  Current management and blood sugar patterns as well as problems:  Since he was having difficulty and hyperglycemia including fasting on oral hypoglycemic drugs he was started back on Levemir insulin 16 units at bedtime in 6/17  His blood sugars are significantly improved fasting, he has only increased the dose by 2 units since last Friday when his morning readings have been higher but highest lately have been only up to 155 in the morning  He was doing some readings at suppertime but none for the last 3 weeks; previously was having sporadic readings that were higher and were averaging about 170.  He says that he does  not eat much at lunchtime usually  He has not done any readings after supper  Hypoglycemia: None      Side effects from medications have been: None  Glucose monitoring:  done less than once a day with One Touch monitor Blood Glucose readings from meter download recently:  Mean values apply above for all meters except median for One Touch  PRE-MEAL Fasting Lunch Dinner Bedtime Overall  Glucose range: 97-155   111-240     Mean/median: 107  171   113    Glycemic control:   Lab Results  Component Value Date   HGBA1C 7.5 (H) 10/15/2015   HGBA1C 6.7 (H) 04/14/2015   HGBA1C 6.3 12/11/2014   Lab Results  Component Value Date   MICROALBUR 1.8 10/20/2015   LDLCALC 63 12/11/2014   CREATININE 1.82 (H) 10/15/2015    Self-care: The diet that the patient has been following is: tries to limit fats and sweets  He is usually eating cereal at breakfast or sometimes peanut butter sandwich. Usually eating a sandwich at lunch and meat and salad with beans at dinner. Have snacks with Jell-O. Eating out about twice a week  Supper at5 PM usually     Exercise: walks in his driveway or stores     Dietician visit:  years ago. Does have a diet plan at home  Weight history: Highest weight in the past has been 220 and lowest 157  Wt Readings from Last 3 Encounters:  11/24/15 179 lb (81.2 kg)  10/20/15 176 lb (79.8 kg)  04/17/15 176 lb 3.2 oz (79.9 kg)    OTHER problems discussed in review of systems      Medication List       Accurate as of 11/24/15 12:25 PM. Always use your most recent med list.          aspirin 81 MG chewable tablet Chew 81 mg by mouth daily.   betamethasone dipropionate 0.05 % cream Commonly known as:  DIPROLENE Apply topically 2 (two) times daily.   cephALEXin 500 MG capsule Commonly known as:  KEFLEX Take 1 capsule (500 mg total) by mouth 3 (three) times daily.   furosemide 20 MG tablet Commonly known as:  LASIX TAKE 1 TABLET BY MOUTH  DAILY.   Insulin Detemir 100 UNIT/ML Pen Commonly known as:  LEVEMIR Inject 16 Units into the skin daily at 10 pm.   Insulin Pen Needle 32G X 4 MM Misc Commonly known as:  CAREFINE PEN NEEDLES Inject insulin 3 times per   losartan 100 MG tablet Commonly known as:  COZAAR TAKE 1 TABLET DAILY   metFORMIN 1000 MG tablet Commonly known as:  GLUCOPHAGE Take 1 tablet (1,000 mg total) by mouth 2 (two) times daily with a meal.   ONE TOUCH ULTRA TEST test strip Generic drug:  glucose blood USE AS DIRECTED TO TEST 2 TIMES DAILY   ONETOUCH DELICA LANCETS FINE Misc by Does not apply route 2 (two) times daily.   pantoprazole 40 MG tablet Commonly known as:  PROTONIX   simvastatin 20 MG tablet Commonly known as:  ZOCOR Take 20 mg by mouth every evening.   sitaGLIPtin 100 MG tablet Commonly known as:  JANUVIA Take 1 tablet (100 mg total) by mouth daily.       Allergies: No Known Allergies  Past Medical History:  Diagnosis Date  . Anemia   . Arthritis    KNEES & BACK  . Cellulitis 08/2013   RT LEG  . Chronic kidney disease 08/2013   ACUTE RENAL   . Dehydration   . Diabetes mellitus without complication ([place])   . Hyperkalemia 08/2013  . Hyperlipemia   . Hypertension     Past Surgical History:  Procedure Laterality Date  . ANTERIOR CERVICAL DECOMP/DISCECTOMY FUSION N/A 10/17/2012   Procedure: ANTERIOR CERVICAL DECOMPRESSION/DISCECTOMY FUSION 2 LEVELS;  Surgeon: Charlie Pitter, MD;  Location: [facility];  Service: Neurosurgery;  Laterality: N/A;  Cervical three-four, four-five anterior cervical decompression fusion with allograft plating  . BACK SURGERY     10 years ago  . CERVICAL DISC SURGERY      Family History  Problem Relation Age of Onset  . Cancer - Colon Mother   . Cancer - Prostate Father     Social History:  reports that he quit smoking about 18 years ago. He has never used smokeless tobacco. He reports that he does not drink alcohol or use drugs.      Review of Systems   He was seen by his PCP for his left lower leg cellulitis and given cephalexin again and also topical cream for itching  RENAL dysfunction: His creatinine is higher than usual, usually takes Tylenol for his back pain  He has increased his Lasix to 2 tablets daily because of swelling of the left leg   Lab Results  Component  Value Date   CREATININE 1.82 (H) 10/15/2015   CREATININE 1.57 (H) 04/14/2015   CREATININE 1.34 12/11/2014   CREATININE 1.44 09/04/2014         Lipids: treated with Zocor 20 mg       Lab Results  Component Value Date   CHOL 138 12/11/2014   HDL 64.30 12/11/2014   LDLCALC 63 12/11/2014   LDLDIRECT 48.9 02/21/2014   TRIG 52.0 12/11/2014   CHOLHDL 2 12/11/2014   HYPERTENSION: Currently treated with losartan 100 mg daily   He is also taking Lasix which was started because of his edema   RENAL insufficiency: He is going to see a nephrologist for this.  Last creatinine was 1.65 with the nephrologist  He has history of anemia    Physical Examination:  BP 132/62 (BP Location: Left Arm, Patient Position: Sitting)   Pulse 89   Wt 179 lb (81.2 kg)   SpO2 97%   BMI 27.22 kg/m   He has minimal tightness on the left lower leg medially. Minimal ankle edema, no leg edema  ASSESSMENT:   Diabetes type 2, normal BMI  See history of present illness for detailed discussion of current diabetes management, blood sugar patterns and problems identified  His blood sugars are significantly better in the morning with starting Levemir Currently with 18 units his fasting readings are reasonably well-controlled However not clear if he is having post prandial hyperglycemia, was having sporadic high readings around suppertime last month Currently on Januvia and metformin also   Hypertension: His blood pressure is controlled with 100 mg losartan  RENAL dysfunction:  Needs nephrology consultation-to be done this week   PLAN:   Levemir 18 units  at suppertime  Start checking blood sugars 2 hours after evening meal and call next week if persistently over 180.  Discussed possibility of using mealtime insulin at suppertime  Also occasionally checked readings after breakfast or lunch  A1c on the next visit      [facility] 11/24/2015, 12:25 PM   Note: This office note was prepared with Estate agent. Any transcriptional errors that result from this process are unintentional.

## 2015-11-28 DIAGNOSIS — I10 Essential (primary) hypertension: Secondary | ICD-10-CM | POA: Diagnosis not present

## 2015-11-28 DIAGNOSIS — N183 Chronic kidney disease, stage 3 (moderate): Secondary | ICD-10-CM | POA: Diagnosis not present

## 2015-12-01 ENCOUNTER — Other Ambulatory Visit: Payer: Self-pay | Admitting: Nephrology

## 2015-12-01 DIAGNOSIS — N183 Chronic kidney disease, stage 3 unspecified: Secondary | ICD-10-CM

## 2015-12-08 ENCOUNTER — Ambulatory Visit
Admission: RE | Admit: 2015-12-08 | Discharge: 2015-12-08 | Disposition: A | Discharge status: Home / Self Care | Payer: PPO | Source: Ambulatory Visit | Attending: Nephrology | Admitting: Nephrology

## 2015-12-08 DIAGNOSIS — N183 Chronic kidney disease, stage 3 unspecified: Secondary | ICD-10-CM

## 2016-01-13 ENCOUNTER — Other Ambulatory Visit: Payer: Self-pay | Admitting: *Deleted

## 2016-01-13 ENCOUNTER — Telehealth: Payer: Self-pay | Admitting: Endocrinology

## 2016-01-13 MED ORDER — LOSARTAN POTASSIUM 100 MG PO TABS: 100.0000 mg | ORAL_TABLET | Freq: Every day | ORAL | 1 refills | Status: DC

## 2016-01-13 MED ORDER — GLUCOSE BLOOD VI STRP: ORAL_STRIP | 1 refills | Status: DC

## 2016-01-13 NOTE — Telephone Encounter (Signed)
One touch delta test strips and potassium pills are needed please call into [facility] in [place]

## 2016-01-13 NOTE — Telephone Encounter (Signed)
Rx's sent, patient is aware.

## 2016-01-26 ENCOUNTER — Other Ambulatory Visit: Payer: Self-pay | Admitting: *Deleted

## 2016-01-26 ENCOUNTER — Ambulatory Visit (INDEPENDENT_AMBULATORY_CARE_PROVIDER_SITE_OTHER): Payer: PPO | Admitting: Endocrinology

## 2016-01-26 ENCOUNTER — Encounter: Payer: Self-pay | Admitting: Endocrinology

## 2016-01-26 VITALS — BP 126/64 | HR 85 | Temp 98.3°F | Resp 14 | Ht 68.0 in | Wt 174.6 lb

## 2016-01-26 DIAGNOSIS — Z23 Encounter for immunization: Secondary | ICD-10-CM | POA: Diagnosis not present

## 2016-01-26 DIAGNOSIS — E1169 Type 2 diabetes mellitus with other specified complication: Secondary | ICD-10-CM | POA: Diagnosis not present

## 2016-01-26 DIAGNOSIS — E1165 Type 2 diabetes mellitus with hyperglycemia: Secondary | ICD-10-CM | POA: Diagnosis not present

## 2016-01-26 DIAGNOSIS — IMO0002 Reserved for concepts with insufficient information to code with codable children: Secondary | ICD-10-CM

## 2016-01-26 LAB — POCT GLYCOSYLATED HEMOGLOBIN (HGB A1C): HEMOGLOBIN A1C: 5.8

## 2016-01-26 MED ORDER — INSULIN PEN NEEDLE 32G X 4 MM MISC: 3 refills | Status: DC

## 2016-01-26 NOTE — Progress Notes (Signed)
Patient ID: Jason Mcpherson, male   DOB: 1941-04-23, 75 y.o.   MRN: [id number]    Reason for Appointment: follow-up for Type 2 Diabetes  History of Present Illness:          Diagnosis: Type 2 diabetes mellitus, date of diagnosis: 1997       Past history: The patient is unclear about his symptoms at diagnosis as well as initial treatment. Most likely has been on metformin for sometime and at some point was also given glipizide. He is also very unclear about when he was started on insulin and records from his previous endocrinologist are not available; may have been taking this over the last 15 years. Also not clear if he had been on different types of insulin However he was previously significantly overweight His A1c in 9/14 was 5.9 and in 2015 had been about 6.9-7 On his initial consultation he was taking metformin and glipizide ER; also he was taking Levemir irregularly based on his blood sugar level Levemir was stopped on his initial consultation in 6/15 He was started on Januvia since 10/29/13 after stopping his Levemir insulin   Recent history:          INSULIN regimen = Levemir 18 units at bedtime  Oral hypoglycemic drugs the patient is taking are: Metformin 2000 mg daily,   Januvia 100 mg     His A1c in 6/17 had gone up to 7.5,now it is down to 5.8  Current management and blood sugar patterns as well as problems:  His blood sugars have continued to improve with taking basal insulin along with metformin and Januvia  He has started monitoring his postprandial readings after supper now and these are excellent with only a couple of readings around 160-170  Also fasting readings are fairly consistent  He has tried to walk a little and overall be more active  Weight has come down 5 pounds  No hypoglycemia overnight.  Hypoglycemia: None      Side effects from medications have been: None  Glucose monitoring:  done less than once a day with One Touch  monitor Blood Glucose readings from meter download recently:  FASTING: Median 124 8-9 PM: Range 94-174 with MEDIAN 118  Glycemic control:   Lab Results  Component Value Date   HGBA1C 5.8 01/26/2016   HGBA1C 7.5 (H) 10/15/2015   HGBA1C 6.7 (H) 04/14/2015   Lab Results  Component Value Date   MICROALBUR 1.8 10/20/2015   LDLCALC 63 12/11/2014   CREATININE 1.82 (H) 10/15/2015    Self-care: The diet that the patient has been following is: tries to limit fats and sweets  He is usually eating cereal at breakfast or sometimes peanut butter sandwich. Usually eating a sandwich at lunch and meat and salad with beans at dinner. Have snacks with Jell-O. Eating out about twice a week  Supper at5 PM usually     Exercise: walks in his driveway or stores     Dietician visit:  years ago. Does have a diet plan at home              Weight history: Highest weight in the past has been 220 and lowest 157  Wt Readings from Last 3 Encounters:  01/26/16 174 lb 9.6 oz (79.2 kg)  11/24/15 179 lb (81.2 kg)  10/20/15 176 lb (79.8 kg)    OTHER problems discussed in review of systems      Medication List  Accurate as of 01/26/16 10:42 AM. Always use your most recent med list.          aspirin 81 MG chewable tablet Chew 81 mg by mouth daily.   betamethasone dipropionate 0.05 % cream Commonly known as:  DIPROLENE Apply topically 2 (two) times daily.   cephALEXin 500 MG capsule Commonly known as:  KEFLEX Take 1 capsule (500 mg total) by mouth 3 (three) times daily.   furosemide 20 MG tablet Commonly known as:  LASIX TAKE 1 TABLET BY MOUTH DAILY.   glucose blood test strip Commonly known as:  ONE TOUCH ULTRA TEST USE AS DIRECTED TO TEST 2 TIMES DAILY Dx code E11.9   Insulin Detemir 100 UNIT/ML Pen Commonly known as:  LEVEMIR Inject 16 Units into the skin daily at 10 pm.   Insulin Pen Needle 32G X 4 MM Misc Commonly known as:  CAREFINE PEN NEEDLES Inject insulin 3 times  per   losartan 100 MG tablet Commonly known as:  COZAAR Take 1 tablet (100 mg total) by mouth daily.   metFORMIN 1000 MG tablet Commonly known as:  GLUCOPHAGE Take 1 tablet (1,000 mg total) by mouth 2 (two) times daily with a meal.   ONETOUCH DELICA LANCETS FINE Misc Used to check sugar 2x daily.   pantoprazole 40 MG tablet Commonly known as:  PROTONIX   simvastatin 20 MG tablet Commonly known as:  ZOCOR Take 20 mg by mouth every evening.   sitaGLIPtin 100 MG tablet Commonly known as:  JANUVIA Take 1 tablet (100 mg total) by mouth daily.       Allergies: No Known Allergies  Past Medical History:  Diagnosis Date  . Anemia   . Arthritis    KNEES & BACK  . Cellulitis 08/2013   RT LEG  . Chronic kidney disease 08/2013   ACUTE RENAL   . Dehydration   . Diabetes mellitus without complication ([place])   . Hyperkalemia 08/2013  . Hyperlipemia   . Hypertension     Past Surgical History:  Procedure Laterality Date  . ANTERIOR CERVICAL DECOMP/DISCECTOMY FUSION N/A 10/17/2012   Procedure: ANTERIOR CERVICAL DECOMPRESSION/DISCECTOMY FUSION 2 LEVELS;  Surgeon: Charlie Pitter, MD;  Location: [facility];  Service: Neurosurgery;  Laterality: N/A;  Cervical three-four, four-five anterior cervical decompression fusion with allograft plating  . BACK SURGERY     10 years ago  . CERVICAL DISC SURGERY      Family History  Problem Relation Age of Onset  . Cancer - Colon Mother   . Cancer - Prostate Father     Social History:  reports that he quit smoking about 18 years ago. He has never used smokeless tobacco. He reports that he does not drink alcohol or use drugs.    Review of Systems   No recent foot infections  RENAL dysfunction: His creatinine is Has been persistently high   Lab Results  Component Value Date   CREATININE 1.82 (H) 10/15/2015   CREATININE 1.57 (H) 04/14/2015   CREATININE 1.34 12/11/2014   CREATININE 1.44 09/04/2014         Lipids: treated with Zocor  20 mg       Lab Results  Component Value Date   CHOL 138 12/11/2014   HDL 64.30 12/11/2014   LDLCALC 63 12/11/2014   LDLDIRECT 48.9 02/21/2014   TRIG 52.0 12/11/2014   CHOLHDL 2 12/11/2014   HYPERTENSION: Currently treated with losartan 100 mg daily   He is also taking Lasix for edema  RENAL insufficiency: He has had evaluation from nephrologist .  Last creatinine was 1.65 with the nephrologist  He has history of anemia    Physical Examination:  BP 126/64   Pulse 85   Temp 98.3 F (36.8 C)   Resp 14   Ht 5\' 8"  (1.727 m)   Wt 174 lb 9.6 oz (79.2 kg)   SpO2 95%   BMI 26.55 kg/m   He has normal skin on his feet and soles and only some callus formation on the right second toe distally without ulceration Minimal ankle edema, 1+ on the left Pedal pulses normal  ASSESSMENT:   Diabetes type 2, normal BMI  See history of present illness for detailed discussion of current diabetes management, blood sugar patterns and problems identified  His blood sugars are significantly better controlled with A1c now below 6 although may be affected by his renal dysfunction  Currently with 18 units his fasting readings are  well-controlled Currently on Januvia and metformin also and this is controlling his postprandial readings  Hypertension: His blood pressure is controlled with 100 mg losartan, this has not been changed by his nephrologist  RENAL dysfunction: his last creatinine was about 1.6 and he will get a follow-up soon with her nephrologist    PLAN:   Because of his renal dysfunction in the lab to reduce his metformin known to 1000 mg and will reassess the dose when he has his follow-up renal function  Also take only half tablet of Januvia daily  No change in insulin  May alternate fasting and after meal readings      Coltyn Hanning 01/26/2016, 10:42 AM   Note: This office note was prepared with Estate agent. Any transcriptional errors  that result from this process are unintentional.

## 2016-01-26 NOTE — Patient Instructions (Signed)
Take only 1 Metformin in am  Januvia 1/2 daily  Check blood sugars on waking up  3x weekly   Also check blood sugars about 2-3 hours after a meal and do this after different meals by rotation  Recommended blood sugar levels on waking up is 90-130 and about 2 hours after meal is 130-160  Please bring your blood sugar monitor to each visit, thank you

## 2016-02-02 DIAGNOSIS — N183 Chronic kidney disease, stage 3 (moderate): Secondary | ICD-10-CM | POA: Diagnosis not present

## 2016-02-09 DIAGNOSIS — E875 Hyperkalemia: Secondary | ICD-10-CM | POA: Diagnosis not present

## 2016-02-09 DIAGNOSIS — N183 Chronic kidney disease, stage 3 (moderate): Secondary | ICD-10-CM | POA: Diagnosis not present

## 2016-02-09 DIAGNOSIS — I10 Essential (primary) hypertension: Secondary | ICD-10-CM | POA: Diagnosis not present

## 2016-03-20 ENCOUNTER — Other Ambulatory Visit: Payer: Self-pay | Admitting: Endocrinology

## 2016-05-04 ENCOUNTER — Other Ambulatory Visit (INDEPENDENT_AMBULATORY_CARE_PROVIDER_SITE_OTHER): Payer: PPO

## 2016-05-04 DIAGNOSIS — E1169 Type 2 diabetes mellitus with other specified complication: Secondary | ICD-10-CM | POA: Diagnosis not present

## 2016-05-04 DIAGNOSIS — IMO0002 Reserved for concepts with insufficient information to code with codable children: Secondary | ICD-10-CM

## 2016-05-04 DIAGNOSIS — E1165 Type 2 diabetes mellitus with hyperglycemia: Secondary | ICD-10-CM | POA: Diagnosis not present

## 2016-05-04 LAB — HEMOGLOBIN A1C: HEMOGLOBIN A1C: 6 % (ref 4.6–6.5)

## 2016-05-04 LAB — COMPREHENSIVE METABOLIC PANEL
ALBUMIN: 4.1 g/dL (ref 3.5–5.2)
ALT: 10 U/L (ref 0–53)
AST: 12 U/L (ref 0–37)
Alkaline Phosphatase: 55 U/L (ref 39–117)
BILIRUBIN TOTAL: 0.4 mg/dL (ref 0.2–1.2)
BUN: 40 mg/dL — AB (ref 6–23)
CALCIUM: 9.8 mg/dL (ref 8.4–10.5)
CO2: 30 mEq/L (ref 19–32)
CREATININE: 1.54 mg/dL — AB (ref 0.40–1.50)
Chloride: 107 mEq/L (ref 96–112)
GFR: 46.81 mL/min — ABNORMAL LOW (ref >60.00)
Glucose, Bld: 248 mg/dL — ABNORMAL HIGH (ref 70–99)
Potassium: 4.6 mEq/L (ref 3.5–5.1)
SODIUM: 142 meq/L (ref 135–145)
TOTAL PROTEIN: 6.6 g/dL (ref 6.0–8.3)

## 2016-05-04 LAB — LIPID PANEL
Cholesterol: 132 mg/dL (ref 0–200)
HDL: 59.1 mg/dL (ref >39.00)
LDL Cholesterol: 52 mg/dL (ref 0–99)
NonHDL: 73.31
TRIGLYCERIDES: 107 mg/dL (ref 0.0–149.0)
Total CHOL/HDL Ratio: 2
VLDL: 21.4 mg/dL (ref 0.0–40.0)

## 2016-05-05 LAB — FRUCTOSAMINE: Fructosamine: 266 umol/L (ref 0–285)

## 2016-05-07 ENCOUNTER — Encounter: Payer: Self-pay | Admitting: Endocrinology

## 2016-05-07 ENCOUNTER — Ambulatory Visit (INDEPENDENT_AMBULATORY_CARE_PROVIDER_SITE_OTHER): Payer: PPO | Admitting: Endocrinology

## 2016-05-07 VITALS — BP 126/68 | HR 68 | Ht 68.9 in | Wt 174.0 lb

## 2016-05-07 DIAGNOSIS — E1165 Type 2 diabetes mellitus with hyperglycemia: Secondary | ICD-10-CM

## 2016-05-07 DIAGNOSIS — E1169 Type 2 diabetes mellitus with other specified complication: Secondary | ICD-10-CM | POA: Diagnosis not present

## 2016-05-07 DIAGNOSIS — I10 Essential (primary) hypertension: Secondary | ICD-10-CM

## 2016-05-07 DIAGNOSIS — E1142 Type 2 diabetes mellitus with diabetic polyneuropathy: Secondary | ICD-10-CM | POA: Diagnosis not present

## 2016-05-07 DIAGNOSIS — N183 Chronic kidney disease, stage 3 unspecified: Secondary | ICD-10-CM

## 2016-05-07 DIAGNOSIS — E782 Mixed hyperlipidemia: Secondary | ICD-10-CM

## 2016-05-07 DIAGNOSIS — IMO0002 Reserved for concepts with insufficient information to code with codable children: Secondary | ICD-10-CM

## 2016-05-07 MED ORDER — METFORMIN HCL 1000 MG PO TABS: 1000.0000 mg | ORAL_TABLET | Freq: Two times a day (BID) | ORAL | 3 refills | Status: DC

## 2016-05-07 MED ORDER — SITAGLIPTIN PHOSPHATE 50 MG PO TABS: 50.0000 mg | ORAL_TABLET | Freq: Every day | ORAL | 1 refills | Status: DC

## 2016-05-07 MED ORDER — LOSARTAN POTASSIUM 100 MG PO TABS: 100.0000 mg | ORAL_TABLET | Freq: Every day | ORAL | 1 refills | Status: DC

## 2016-05-07 MED ORDER — FUROSEMIDE 20 MG PO TABS: 20.0000 mg | ORAL_TABLET | Freq: Every day | ORAL | 1 refills | Status: DC

## 2016-05-07 NOTE — Progress Notes (Signed)
Patient ID: Jason Mcpherson, male   DOB: Jun 07, 1940, 76 y.o.   MRN: [id number]    Reason for Appointment: follow-up for Type 2 Diabetes  History of Present Illness:          Diagnosis: Type 2 diabetes mellitus, date of diagnosis: 1997       Past history: The patient is unclear about his symptoms at diagnosis as well as initial treatment. Most likely has been on metformin for sometime and at some point was also given glipizide. He is also very unclear about when he was started on insulin and records from his previous endocrinologist are not available; may have been taking this over the last 15 years. Also not clear if he had been on different types of insulin However he was previously significantly overweight His A1c in 9/14 was 5.9 and in 2015 had been about 6.9-7 On his initial consultation he was taking metformin and glipizide ER; he was taking Levemir irregularly based on his blood sugar level Levemir was stopped on his initial consultation in 6/15 He was started on Januvia since 10/29/13 after stopping his Levemir insulin   Recent history:          INSULIN regimen = Levemir 18 units at dinnertime  Oral hypoglycemic drugs the patient is taking are: Metformin 2000 mg daily,   Januvia 50- 100 mg    His A1c in 6/17 had gone up to 7.5 and was started back on Levemir, subsequently A1c has been improved between 5.8-6  Current management and blood sugar patterns as well as problems:  Although he has been told to vary the times when he checks his blood sugar he is still doing it on waking up and bedtime  Also he was told to take only half tablet of Januvia but for convenience he will frequently take the whole tablet  Not clear why his readings around 8-9 PM have been variable with readings occasionally over 200; even with this his median blood sugars under 150 at night  However he probably does not understand the need for balanced low fat meals and carbohydrate control  He  also does not check any readings after breakfast or lunch  He is trying to walk as much as possible, weight is stable.  He will drink some Orange juice late morning and does not understand that this will raise his blood sugar  No hypoglycemia overnight.  Hypoglycemia: None      Side effects from medications have been: None  Glucose monitoring:  done less than once a day with One Touch monitor Blood Glucose readings from meter download recently:  Mean values apply above for all meters except median for One Touch  PRE-MEAL Fasting Lunch Dinner Bedtime Overall  Glucose range: 98-134    103-236    Mean/median: 117    149  124    Glycemic control:   Lab Results  Component Value Date   HGBA1C 6.0 05/04/2016   HGBA1C 5.8 01/26/2016   HGBA1C 7.5 (H) 10/15/2015   Lab Results  Component Value Date   MICROALBUR 1.8 10/20/2015   LDLCALC 52 05/04/2016   CREATININE 1.54 (H) 05/04/2016    Self-care: The diet that the patient has been following is: tries to limit fats and sweets  He is usually eating cereal at breakfast or sometimes peanut butter sandwich. Usually eating a sandwich at lunch and meat and salad with beans at dinner. Have snacks with Jell-O. Eating out about twice a week  Supper at 5 PM usually     Exercise: walks in his driveway or stores     Dietician visit:  years ago. Does have a diet plan at home              Weight history: Highest weight in the past has been 220 and lowest 157  Wt Readings from Last 3 Encounters:  05/07/16 174 lb (78.9 kg)  01/26/16 174 lb 9.6 oz (79.2 kg)  11/24/15 179 lb (81.2 kg)    OTHER problems discussed in review of systems    Allergies as of 05/07/2016   No Known Allergies     Medication List       Accurate as of 05/07/16 11:10 AM. Always use your most recent med list.          aspirin 81 MG chewable tablet Chew 81 mg by mouth daily.   betamethasone dipropionate 0.05 % cream Commonly known as:  DIPROLENE Apply  topically 2 (two) times daily.   furosemide 20 MG tablet Commonly known as:  LASIX TAKE 1 TABLET BY MOUTH DAILY.   glucose blood test strip Commonly known as:  ONE TOUCH ULTRA TEST USE AS DIRECTED TO TEST 2 TIMES DAILY Dx code E11.9   Insulin Pen Needle 32G X 4 MM Misc Commonly known as:  CAREFINE PEN NEEDLES Inject insulin 1 time per day   LEVEMIR FLEXTOUCH 100 UNIT/ML Pen Generic drug:  Insulin Detemir INJECT 16 UNITS INTO THE SKIN DAILY AT 10PM   losartan 100 MG tablet Commonly known as:  COZAAR Take 1 tablet (100 mg total) by mouth daily.   metFORMIN 1000 MG tablet Commonly known as:  GLUCOPHAGE Take 1 tablet (1,000 mg total) by mouth 2 (two) times daily with a meal.   ONETOUCH DELICA LANCETS FINE Misc Used to check sugar 2x daily.   simvastatin 20 MG tablet Commonly known as:  ZOCOR Take 20 mg by mouth every evening.   sitaGLIPtin 100 MG tablet Commonly known as:  JANUVIA Take 1 tablet (100 mg total) by mouth daily.       Allergies: No Known Allergies  Past Medical History:  Diagnosis Date  . Anemia   . Arthritis    KNEES & BACK  . Cellulitis 08/2013   RT LEG  . Chronic kidney disease 08/2013   ACUTE RENAL   . Dehydration   . Diabetes mellitus without complication ([place])   . Hyperkalemia 08/2013  . Hyperlipemia   . Hypertension     Past Surgical History:  Procedure Laterality Date  . ANTERIOR CERVICAL DECOMP/DISCECTOMY FUSION N/A 10/17/2012   Procedure: ANTERIOR CERVICAL DECOMPRESSION/DISCECTOMY FUSION 2 LEVELS;  Surgeon: Charlie Pitter, MD;  Location: [facility];  Service: Neurosurgery;  Laterality: N/A;  Cervical three-four, four-five anterior cervical decompression fusion with allograft plating  . BACK SURGERY     10 years ago  . CERVICAL DISC SURGERY      Family History  Problem Relation Age of Onset  . Cancer - Colon Mother   . Cancer - Prostate Father     Social History:  reports that he quit smoking about 18 years ago. He has never used  smokeless tobacco. He reports that he does not drink alcohol or use drugs.    Review of Systems    RENAL dysfunction: Probably diabetes/hypertension related and evaluated by nephrologist Renal function has fluctuated but same as about a year ago   Lab Results  Component Value Date   CREATININE 1.54 (H)  05/04/2016   CREATININE 1.82 (H) 10/15/2015   CREATININE 1.57 (H) 04/14/2015   CREATININE 1.34 12/11/2014         Lipids: treated with Zocor 20 mg       Lab Results  Component Value Date   CHOL 132 05/04/2016   HDL 59.10 05/04/2016   LDLCALC 52 05/04/2016   LDLDIRECT 48.9 02/21/2014   TRIG 107.0 05/04/2016   CHOLHDL 2 05/04/2016   HYPERTENSION: Has been treated with losartan 100 mg daily, also followed by nephrologist   He is also taking Lasix for edema    Physical Examination:  BP 126/68   Pulse 68   Ht 5' 8.9" (1.75 m)   Wt 174 lb (78.9 kg)   SpO2 96%   BMI 25.77 kg/m   No foot ulcers seen No significant edema   ASSESSMENT:   Diabetes type 2, normal BMI  See history of present illness for detailed discussion of current diabetes management, blood sugar patterns and problems identified  Currently has an upper normal A1c His blood sugars are reasonably well controlled although he has more fluctuation after his evening meal The last few readings are around 180 after supper and discussed that these are relatively high considering the day are 3-4 hours after eating  On an average blood sugars are still fairly good at night but is not checking readings 2 hours after eating as directed Also not due to male have high readings after breakfast and lunch Since fructosamine is also fairly good but is unlikely that he is having consistently high readings  Discussed possible need for mealtime insulin if he has consistently high readings at night  Currently with 18 units his fasting readings are  well-controlled Currently on Januvia and metformin also and this is  controlling his postprandial readings  Hypertension: His blood pressure is controlled with 100 mg losartan and he will continue  Edema: Controlled with Lasix, his nephrologist has recommended continuing this  RENAL dysfunction: his last creatinine is quite stable  Hyperlipidemia: LDL is excellent with low-dose Zocor    PLAN:   Because of his renal dysfunction he will reduce his metformin to 1500 mg a day  He will use 50 mg of Januvia daily.  No change in Levemir  He will call if he has persistently high readings 2 hours after any of the meals  Avoid drinking juice.  He will try to be cautious about high-fat foods and high carbohydrate intake and try to note what makes his blood sugar rise excessively   Encouraged him to check his feet daily and report any ulcerations  New prescriptions for his medications given including Lasix    Counseling time on subjects discussed above is over 50% of today's 25 minute visit     Elanore Talcott 05/07/2016, 11:10 AM   Note: This office note was prepared with Estate agent. Any transcriptional errors that result from this process are unintentional.

## 2016-05-07 NOTE — Patient Instructions (Addendum)
Check blood sugars on waking up  3x weekly  Also check blood sugars about 2 hours after a meal and do this after different meals by rotation  Recommended blood sugar levels on waking up is 90-130 and about 2 hours after meal is 130-160  Please bring your blood sugar monitor to each visit, thank you  Check some after breakfast  Metformin 1/2 in am and 1 at dinner

## 2016-05-09 ENCOUNTER — Other Ambulatory Visit: Payer: Self-pay | Admitting: Endocrinology

## 2016-05-18 ENCOUNTER — Other Ambulatory Visit: Payer: Self-pay | Admitting: Endocrinology

## 2016-07-12 ENCOUNTER — Other Ambulatory Visit: Payer: Self-pay | Admitting: Endocrinology

## 2016-07-19 ENCOUNTER — Other Ambulatory Visit: Payer: Self-pay | Admitting: Endocrinology

## 2016-07-27 ENCOUNTER — Other Ambulatory Visit: Payer: PPO

## 2016-08-03 ENCOUNTER — Encounter: Payer: Self-pay | Admitting: Endocrinology

## 2016-08-03 ENCOUNTER — Ambulatory Visit (INDEPENDENT_AMBULATORY_CARE_PROVIDER_SITE_OTHER): Payer: PPO | Admitting: Endocrinology

## 2016-08-03 VITALS — BP 104/60 | HR 83 | Ht 69.0 in | Wt 176.0 lb

## 2016-08-03 DIAGNOSIS — I10 Essential (primary) hypertension: Secondary | ICD-10-CM | POA: Diagnosis not present

## 2016-08-03 DIAGNOSIS — E1169 Type 2 diabetes mellitus with other specified complication: Secondary | ICD-10-CM | POA: Diagnosis not present

## 2016-08-03 DIAGNOSIS — E1165 Type 2 diabetes mellitus with hyperglycemia: Secondary | ICD-10-CM

## 2016-08-03 DIAGNOSIS — IMO0002 Reserved for concepts with insufficient information to code with codable children: Secondary | ICD-10-CM

## 2016-08-03 DIAGNOSIS — Z794 Long term (current) use of insulin: Secondary | ICD-10-CM

## 2016-08-03 LAB — COMPREHENSIVE METABOLIC PANEL
ALBUMIN: 4.3 g/dL (ref 3.5–5.2)
ALT: 12 U/L (ref 0–53)
AST: 14 U/L (ref 0–37)
Alkaline Phosphatase: 59 U/L (ref 39–117)
BUN: 42 mg/dL — AB (ref 6–23)
CHLORIDE: 110 meq/L (ref 96–112)
CO2: 27 mEq/L (ref 19–32)
Calcium: 10 mg/dL (ref 8.4–10.5)
Creatinine, Ser: 1.54 mg/dL — ABNORMAL HIGH (ref 0.40–1.50)
GFR: 46.78 mL/min — ABNORMAL LOW (ref >60.00)
GLUCOSE: 163 mg/dL — AB (ref 70–99)
POTASSIUM: 4.8 meq/L (ref 3.5–5.1)
SODIUM: 143 meq/L (ref 135–145)
TOTAL PROTEIN: 6.9 g/dL (ref 6.0–8.3)
Total Bilirubin: 0.4 mg/dL (ref 0.2–1.2)

## 2016-08-03 LAB — HEMOGLOBIN A1C: Hgb A1c MFr Bld: 6.3 % (ref 4.6–6.5)

## 2016-08-03 LAB — MICROALBUMIN / CREATININE URINE RATIO
Creatinine,U: 47.6 mg/dL
Microalb Creat Ratio: 3.2 mg/g (ref 0.0–30.0)
Microalb, Ur: 1.5 mg/dL (ref 0.0–1.9)

## 2016-08-03 NOTE — Progress Notes (Signed)
Patient ID: Jason Mcpherson, male   DOB: Oct 05, 1940, 76 y.o.   MRN: [id number]    Reason for Appointment: follow-up for Type 2 Diabetes  History of Present Illness:          Diagnosis: Type 2 diabetes mellitus, date of diagnosis: 1997       Past history: The patient is unclear about his symptoms at diagnosis as well as initial treatment. Most likely has been on metformin for sometime and at some point was also given glipizide. He is also very unclear about when he was started on insulin and records from his previous endocrinologist are not available; may have been taking this over the last 15 years. Also not clear if he had been on different types of insulin However he was previously significantly overweight His A1c in 9/14 was 5.9 and in 2015 had been about 6.9-7 On his initial consultation he was taking metformin and glipizide ER; he was taking Levemir irregularly based on his blood sugar level Levemir was stopped on his initial consultation in 6/15 He was started on Januvia since 10/29/13 after stopping his Levemir insulin   Recent history:          INSULIN regimen = Levemir 18 units at dinnertime  Oral hypoglycemic drugs the patient is taking are: Metformin 1500 mg daily,   Januvia 50mg     His A1c in 6/17 had gone up to 7.5 and was started back on Levemir, subsequently A1c has been upper normal  Current management and blood sugar patterns as well as problems:  His blood sugars overall appear to be about the same as before  Although he has been told to vary the times when he checks his blood sugar he is still doing it on waking up and 2-3 hours after supper  Because of his renal dysfunction he was told to reduce metformin to 1500 mg and Januvia 50 mg on the last visit  His blood sugars are mostly okay after supper but periodically will have significantly high readings, stating this is when he is eating out and drinking sweet tea or otherwise not watching diet  consistently  He thinks his blood sugars are high after breakfast but does not monitor, he has stopped taking juice in the morning but is only eating cereal now  He also does not check any readings after breakfast or lunch  He is trying to walk as much as possible, weight is stable.  No hypoglycemia overnight.  Hypoglycemia: None      Side effects from medications have been: None  Glucose monitoring:  done less than once a day with One Touch monitor Blood Glucose readings from meter download recently:  Mean values apply above for all meters except median for One Touch  PRE-MEAL Fasting Lunch Dinner Bedtime Overall  Glucose range: 90-142   91-301   Mean/median: 114   126 118    Glycemic control:   Lab Results  Component Value Date   HGBA1C 6.3 08/03/2016   HGBA1C 6.0 05/04/2016   HGBA1C 5.8 01/26/2016   Lab Results  Component Value Date   MICROALBUR 1.5 08/03/2016   LDLCALC 52 05/04/2016   CREATININE 1.54 (H) 08/03/2016    Self-care: The diet that the patient has been following is: tries to limit fats and sweets  He is usually eating cereal at breakfast. Usually eating a sandwich at lunch and meat and salad with beans at dinner. Have snacks with Jell-O. Eating out about twice a week  And will then have sweet tea  Supper at 5 PM usually     Exercise: walks in his driveway or In stores     Dietician visit:  years ago. Does have a diet plan at home              Weight history: Highest weight in the past has been 220 and lowest 157  Wt Readings from Last 3 Encounters:  08/03/16 176 lb (79.8 kg)  05/07/16 174 lb (78.9 kg)  01/26/16 174 lb 9.6 oz (79.2 kg)    OTHER problems discussed in review of systems    Allergies as of 08/03/2016   No Known Allergies     Medication List       Accurate as of 08/03/16  8:58 PM. Always use your most recent med list.          aspirin 81 MG chewable tablet Chew 81 mg by mouth daily.   betamethasone dipropionate 0.05 %  cream Commonly known as:  DIPROLENE Apply topically 2 (two) times daily.   furosemide 20 MG tablet Commonly known as:  LASIX Take 1 tablet (20 mg total) by mouth daily.   Insulin Pen Needle 32G X 4 MM Misc Commonly known as:  CAREFINE PEN NEEDLES Inject insulin 1 time per day   LEVEMIR FLEXTOUCH 100 UNIT/ML Pen Generic drug:  Insulin Detemir INJECT 16 UNITS INTO THE SKIN DAILY AT 10PM   losartan 100 MG tablet Commonly known as:  COZAAR Take 1 tablet (100 mg total) by mouth daily.   metFORMIN 1000 MG tablet Commonly known as:  GLUCOPHAGE Take 1 tablet (1,000 mg total) by mouth 2 (two) times daily with a meal.   ONE TOUCH ULTRA TEST test strip Generic drug:  glucose blood USE AS DIRECTED TO TEST TWICE DAILY   ONETOUCH DELICA LANCETS FINE Misc USED TO CHECK SUGAR 2X DAILY.   simvastatin 20 MG tablet Commonly known as:  ZOCOR Take 20 mg by mouth every evening.   sitaGLIPtin 50 MG tablet Commonly known as:  JANUVIA Take 1 tablet (50 mg total) by mouth daily.       Allergies: No Known Allergies  Past Medical History:  Diagnosis Date  . Anemia   . Arthritis    KNEES & BACK  . Cellulitis 08/2013   RT LEG  . Chronic kidney disease 08/2013   ACUTE RENAL   . Dehydration   . Diabetes mellitus without complication ([place])   . Hyperkalemia 08/2013  . Hyperlipemia   . Hypertension     Past Surgical History:  Procedure Laterality Date  . ANTERIOR CERVICAL DECOMP/DISCECTOMY FUSION N/A 10/17/2012   Procedure: ANTERIOR CERVICAL DECOMPRESSION/DISCECTOMY FUSION 2 LEVELS;  Surgeon: Charlie Pitter, MD;  Location: [facility];  Service: Neurosurgery;  Laterality: N/A;  Cervical three-four, four-five anterior cervical decompression fusion with allograft plating  . BACK SURGERY     10 years ago  . CERVICAL DISC SURGERY      Family History  Problem Relation Age of Onset  . Cancer - Colon Mother   . Cancer - Prostate Father     Social History:  reports that he quit smoking  about 18 years ago. He has never used smokeless tobacco. He reports that he does not drink alcohol or use drugs.    Review of Systems    RENAL dysfunction: Probably diabetes/hypertension related and evaluated by nephrologist No recommendations made by nephrologist on the last visit   Lab Results  Component Value Date  CREATININE 1.54 (H) 08/03/2016   CREATININE 1.54 (H) 05/04/2016   CREATININE 1.82 (H) 10/15/2015   CREATININE 1.57 (H) 04/14/2015         Lipids: treated with Zocor 20 mg       Lab Results  Component Value Date   CHOL 132 05/04/2016   HDL 59.10 05/04/2016   LDLCALC 52 05/04/2016   LDLDIRECT 48.9 02/21/2014   TRIG 107.0 05/04/2016   CHOLHDL 2 05/04/2016    HYPERTENSION: Has been treated with losartan 100 mg daily, also followed by nephrologist, Last seen in October 2017 Does not complain of lightheadedness but blood pressure is lower than usual   He is also taking Lasix for edema    Physical Examination:  BP 104/60   Pulse 83   Ht 5\' 9"  (1.753 m)   Wt 176 lb (79.8 kg)   SpO2 94%   BMI 25.99 kg/m   Repeat blood pressure 112/58 standing  No foot ulcers seen No  Edema Pedal pulses appear normal   ASSESSMENT:   Diabetes type 2, normal BMI  See history of present illness for detailed discussion of current diabetes management, blood sugar patterns and problems identified  A1c pending from today  His blood sugars are consistently well controlled although he has more fluctuation after his evening meal because of inconsistent diet or drinking sugar-containing drinks Also may have high readings after breakfast with eating cereal as seen on his last visit  Currently with 18 units his fasting readings are  well-controlled Currently on Januvia and metformin also  Hypertension: His blood pressure is relatively lower compared to previous visits Taking 100 mg losartan   Edema: Controlled with Lasix  History of foot ulcers: Has no skin lesions  on his feet today  RENAL dysfunction: his last creatinine is quite stable  Hyperlipidemia: LDL is excellent with low-dose Zocor    PLAN:   Discussed improving diet with cutting back on high glycemic index foods like cereal, avoiding drinks with sugar and high-fat meals  No change in Levemir or Januvia/metformin  He does need to try to check some readings after breakfast or lunch and not fasting every day  He will call if he has persistently high readings 2 hours after any of the meals  Reduce losartan to half tablet  Reminded him to check his feet daily and report any ulcerations  New prescriptions for his medications given including Lasix    Patient Instructions  Unsweet tea  Losartan HALF tab daily  Check blood sugars on waking up  3x weekly  Also check blood sugars about 2 hours after a meal and do this after different meals by rotation  Recommended blood sugar levels on waking up is 90-130 and about 2 hours after meal is 130-160  Please bring your blood sugar monitor to each visit, thank you       [facility] 08/03/2016, 8:58 PM   Note: This office note was prepared with Dragon voice recognition system technology. Any transcriptional errors that result from this process are unintentional.

## 2016-08-03 NOTE — Patient Instructions (Addendum)
Unsweet tea  Losartan HALF tab daily  Check blood sugars on waking up  3x weekly  Also check blood sugars about 2 hours after a meal and do this after different meals by rotation  Recommended blood sugar levels on waking up is 90-130 and about 2 hours after meal is 130-160  Please bring your blood sugar monitor to each visit, thank you

## 2016-12-06 DIAGNOSIS — E7800 Pure hypercholesterolemia, unspecified: Secondary | ICD-10-CM | POA: Diagnosis not present

## 2016-12-06 DIAGNOSIS — E1122 Type 2 diabetes mellitus with diabetic chronic kidney disease: Secondary | ICD-10-CM | POA: Diagnosis not present

## 2016-12-06 DIAGNOSIS — N183 Chronic kidney disease, stage 3 (moderate): Secondary | ICD-10-CM | POA: Diagnosis not present

## 2016-12-06 DIAGNOSIS — Z1389 Encounter for screening for other disorder: Secondary | ICD-10-CM | POA: Diagnosis not present

## 2016-12-06 DIAGNOSIS — I10 Essential (primary) hypertension: Secondary | ICD-10-CM | POA: Diagnosis not present

## 2016-12-06 DIAGNOSIS — Z0000 Encounter for general adult medical examination without abnormal findings: Secondary | ICD-10-CM | POA: Diagnosis not present

## 2016-12-07 ENCOUNTER — Ambulatory Visit: Payer: PPO | Admitting: Endocrinology

## 2016-12-08 ENCOUNTER — Other Ambulatory Visit: Payer: Self-pay | Admitting: Endocrinology

## 2016-12-14 ENCOUNTER — Encounter: Payer: Self-pay | Admitting: Endocrinology

## 2016-12-14 ENCOUNTER — Other Ambulatory Visit: Payer: Self-pay

## 2016-12-14 ENCOUNTER — Ambulatory Visit (INDEPENDENT_AMBULATORY_CARE_PROVIDER_SITE_OTHER): Payer: PPO | Admitting: Endocrinology

## 2016-12-14 VITALS — BP 120/70 | HR 82 | Ht 69.0 in | Wt 169.2 lb

## 2016-12-14 DIAGNOSIS — E1165 Type 2 diabetes mellitus with hyperglycemia: Secondary | ICD-10-CM | POA: Diagnosis not present

## 2016-12-14 DIAGNOSIS — E1142 Type 2 diabetes mellitus with diabetic polyneuropathy: Secondary | ICD-10-CM | POA: Diagnosis not present

## 2016-12-14 DIAGNOSIS — N183 Chronic kidney disease, stage 3 unspecified: Secondary | ICD-10-CM

## 2016-12-14 DIAGNOSIS — Z794 Long term (current) use of insulin: Secondary | ICD-10-CM

## 2016-12-14 DIAGNOSIS — I10 Essential (primary) hypertension: Secondary | ICD-10-CM

## 2016-12-14 LAB — POCT GLYCOSYLATED HEMOGLOBIN (HGB A1C): HEMOGLOBIN A1C: 5.3

## 2016-12-14 LAB — COMPREHENSIVE METABOLIC PANEL
ALBUMIN: 4.5 g/dL (ref 3.5–5.2)
ALK PHOS: 76 U/L (ref 39–117)
ALT: 10 U/L (ref 0–53)
AST: 13 U/L (ref 0–37)
BUN: 49 mg/dL — ABNORMAL HIGH (ref 6–23)
CHLORIDE: 108 meq/L (ref 96–112)
CO2: 27 mEq/L (ref 19–32)
Calcium: 10 mg/dL (ref 8.4–10.5)
Creatinine, Ser: 1.42 mg/dL (ref 0.40–1.50)
GFR: 51.33 mL/min — AB (ref >60.00)
Glucose, Bld: 98 mg/dL (ref 70–99)
POTASSIUM: 4.6 meq/L (ref 3.5–5.1)
Sodium: 142 mEq/L (ref 135–145)
TOTAL PROTEIN: 6.8 g/dL (ref 6.0–8.3)
Total Bilirubin: 0.4 mg/dL (ref 0.2–1.2)

## 2016-12-14 LAB — LIPID PANEL
CHOL/HDL RATIO: 2
CHOLESTEROL: 138 mg/dL (ref 0–200)
HDL: 73.7 mg/dL (ref >39.00)
LDL CALC: 52 mg/dL (ref 0–99)
NONHDL: 64.31
Triglycerides: 63 mg/dL (ref 0.0–149.0)
VLDL: 12.6 mg/dL (ref 0.0–40.0)

## 2016-12-14 MED ORDER — GLIMEPIRIDE 1 MG PO TABS: 1.0000 mg | ORAL_TABLET | Freq: Every day | ORAL | 2 refills | Status: DC

## 2016-12-14 MED ORDER — SIMVASTATIN 20 MG PO TABS: 20.0000 mg | ORAL_TABLET | Freq: Every evening | ORAL | 2 refills | Status: DC

## 2016-12-14 NOTE — Patient Instructions (Signed)
Check sugar 2 hrs after meals

## 2016-12-14 NOTE — Progress Notes (Signed)
Patient ID: Jason Mcpherson, male   DOB: [birth year], 76 y.o.   MRN: [id number]    Reason for Appointment: follow-up for Type 2 Diabetes  History of Present Illness:          Diagnosis: Type 2 diabetes mellitus, date of diagnosis: 1997       Past history: The patient is unclear about his symptoms at diagnosis as well as initial treatment. Most likely has been on metformin for sometime and at some point was also given glipizide. He is also very unclear about when he was started on insulin and records from his previous endocrinologist are not available; may have been taking this over the last 15 years. Also not clear if he had been on different types of insulin However he was previously significantly overweight His A1c in 9/14 was 5.9 and in 2015 had been about 6.9-7 On his initial consultation he was taking metformin and glipizide ER; he was taking Levemir irregularly based on his blood sugar level Levemir was stopped on his initial consultation in 6/15 He was started on Januvia since 10/29/13 after stopping his Levemir insulin  His A1c in 6/17 had gone up to 7.5 and was started back on Levemir  Recent history:          INSULIN regimen = Levemir 18 units at dinnertime  Oral hypoglycemic drugs the patient is taking are: Metformin 1500 mg daily,   Januvia 50mg       A1c has been upper normal and is now lower than expected at 5.3  Current management and blood sugar patterns as well as problems:  His blood sugars after meals are overall relatively higher than before although not consistently high again  He has lost weight and he thinks he may have cut back on portions especially at lunch  Also he is cutting out cereal in the morning which was usually causing his blood sugars to be high ands now getting.  Butter sandwiches in the morning  Also is better about cutting ut drinks with sugar and a lot of fried food  FASTING blood sugars are excellent with less variability with  current regimen of Levemir 18 units  He has been given guidelines on when to check his blood sugar buthe is still doing it on waking up and 3 hours after supper  He is complaining about the cost of Januvia now   Hypoglycemia: None      Side effects from medications have been: None  Glucose monitoring:  done less than once a day with One Touch monitor Blood Glucose readings from meter download recently:  Mean values apply above for all meters except median for One Touch  PRE-MEAL Fasting Lunch Dinner Bedtime Overall  Glucose range: 90-1 45   94-1 95   Mean/median: 107   142 122    Glycemic control:   Lab Results  Component Value Date   HGBA1C 5.3 12/14/2016   HGBA1C 6.3 08/03/2016   HGBA1C 6.0 05/04/2016   Lab Results  Component Value Date   MICROALBUR 1.5 08/03/2016   LDLCALC 52 05/04/2016   CREATININE 1.54 (H) 08/03/2016    Self-care: The diet that the patient has been following is: tries to limit fats and sweets  He is usually eating cereal at breakfast. Usually eating a sandwich at lunch and meat and salad with beans at dinner. Have snacks with Jell-O. Eating out about twice a week And will then have sweet tea  Supper at 5 PM usually  Exercise: walks in his driveway or In stores, 30 min    Dietician visit:  years ago. Does have a diet plan at home              Weight history: Highest weight in the past has been 220 and lowest 157  Wt Readings from Last 3 Encounters:  12/14/16 169 lb 3.2 oz (76.7 kg)  08/03/16 176 lb (79.8 kg)  05/07/16 174 lb (78.9 kg)    OTHER problems discussed in review of systems    Allergies as of 12/14/2016   No Known Allergies     Medication List       Accurate as of 12/14/16 11:58 AM. Always use your most recent med list.          aspirin 81 MG chewable tablet Chew 81 mg by mouth daily.   betamethasone dipropionate 0.05 % cream Commonly known as:  DIPROLENE Apply topically 2 (two) times daily.   furosemide 20 MG  tablet Commonly known as:  LASIX Take 1 tablet (20 mg total) by mouth daily.   glimepiride 1 MG tablet Commonly known as:  AMARYL Take 1 tablet (1 mg total) by mouth daily with breakfast.   Insulin Pen Needle 32G X 4 MM Misc Commonly known as:  CAREFINE PEN NEEDLES Inject insulin 1 time per day   JANUVIA 50 MG tablet Generic drug:  sitaGLIPtin TAKE 1 TABLET (50 MG TOTAL) BY MOUTH DAILY.   LEVEMIR FLEXTOUCH 100 UNIT/ML Pen Generic drug:  Insulin Detemir INJECT 16 UNITS INTO THE SKIN DAILY AT 10PM   losartan 100 MG tablet Commonly known as:  COZAAR Take 1 tablet (100 mg total) by mouth daily.   metFORMIN 1000 MG tablet Commonly known as:  GLUCOPHAGE Take 1 tablet (1,000 mg total) by mouth 2 (two) times daily with a meal.   ONE TOUCH ULTRA TEST test strip Generic drug:  glucose blood USE AS DIRECTED TO TEST TWICE DAILY   ONETOUCH DELICA LANCETS FINE Misc USED TO CHECK SUGAR 2X DAILY.   simvastatin 20 MG tablet Commonly known as:  ZOCOR Take 1 tablet (20 mg total) by mouth every evening.       Allergies: No Known Allergies  Past Medical History:  Diagnosis Date  . Anemia   . Arthritis    KNEES & BACK  . Cellulitis 08/2013   RT LEG  . Chronic kidney disease 08/2013   ACUTE RENAL   . Dehydration   . Diabetes mellitus without complication ([place])   . Hyperkalemia 08/2013  . Hyperlipemia   . Hypertension     Past Surgical History:  Procedure Laterality Date  . ANTERIOR CERVICAL DECOMP/DISCECTOMY FUSION N/A 10/17/2012   Procedure: ANTERIOR CERVICAL DECOMPRESSION/DISCECTOMY FUSION 2 LEVELS;  Surgeon: Charlie Pitter, MD;  Location: [facility];  Service: Neurosurgery;  Laterality: N/A;  Cervical three-four, four-five anterior cervical decompression fusion with allograft plating  . BACK SURGERY     10 years ago  . CERVICAL DISC SURGERY      Family History  Problem Relation Age of Onset  . Cancer - Colon Mother   . Cancer - Prostate Father     Social History:   reports that he quit smoking about 19 years ago. He has never used smokeless tobacco. He reports that he does not drink alcohol or use drugs.    Review of Systems    RENAL dysfunction: Probably diabetes/hypertension related and has been followed by nephrologist Needs follow-up  Lab Results  Component  Value Date   CREATININE 1.54 (H) 08/03/2016   CREATININE 1.54 (H) 05/04/2016   CREATININE 1.82 (H) 10/15/2015   CREATININE 1.57 (H) 04/14/2015         Lipids: treated with Zocor 20 mg, needs follow-up       Lab Results  Component Value Date   CHOL 132 05/04/2016   HDL 59.10 05/04/2016   LDLCALC 52 05/04/2016   LDLDIRECT 48.9 02/21/2014   TRIG 107.0 05/04/2016   CHOLHDL 2 05/04/2016    HYPERTENSION: Has been treated with losartan 100 mg daily, also followed by nephrologist Does not complain of lightheadedness  He is also taking Lasix for pedal edema    Physical Examination:  BP 120/70   Pulse 82   Ht 5\' 9"  (1.753 m)   Wt 169 lb 3.2 oz (76.7 kg)   SpO2 97%   BMI 24.99 kg/m   Repeat blood pressure 122/64 standing  Diabetic Foot Exam - Simple   Simple Foot Form Diabetic Foot exam was performed with the following findings:  Yes 12/14/2016 11:05 AM  Visual Inspection See comments:  Yes Sensation Testing See comments:  Yes Pulse Check Posterior Tibialis and Dorsalis pulse intact bilaterally:  Yes Comments Hammertoes present especially left side No skin lesions or ulcerations on the toes or plantar surfaces Some onychomycosis present Decreased to absent monofilament sensation on the left second through fourth toes, are relatively normal on the first 2 Posterior tibialis pulses difficult to palpate    1+ ankle edema present  ASSESSMENT:   Diabetes type 2, normal BMI  See history of present illness for detailed discussion of current diabetes management, blood sugar patterns and problems identified  A1c it is lower than expected at 5.3 done in the office  today, previously 6.3 He has lost weight and most likely is doing better with diet overall and probably not having as many high readings after breakfast and lunch as before He is trying to be as active as possible Fasting readings are very consistent with taking units Levemir Currently on Januvia and metformin also However he does not want to pay out of pocket for Januvia with being in the doughnut hole  Preventive care: Foot exam done today, discussed general preventive measures, he does try to check his feet daily He is overdue for eye exam and discussed need to have this done regularly, he will schedule  Hypertension: His blood pressure iscontrolled Taking 100 mg losartan   Edema: Controlled fairly wellwith Lasix  History of foot ulcers: Has no skin lesions on his feet again  RENAL dysfunction:needs follow-up  Hyperlipidemia: needs follow-up     PLAN:   Discussed options to improve his postprandial readings in the evenings with his not being able to afford Januvia  Since he is usually eating only 2 meals a day he can try Amaryl 1 mg at suppertime instead of Januvia  Most likely he can reduce his Levemir by 2 units at the same time  She will call if he has any difficulties with blood sugar regulation after this  Again discussed trying to avoid high glycemic index foods at various times especially breakfast  improving diet with cutting back on high glycemic index foods like cereal, avoiding drinks with sugar and high-fat meals  No change in Levemir or Januvia/metformin  He does need to try to check some readings after breakfast or lunch and not fasting every day  He will call if he has persistently high readings 2 hours after any of  the meals  Reduce losartan to half tablet  Reminded him to check his feet daily and report any ulcerations  New prescriptions for his medications given including Lasix    Patient Instructions  Check sugar 2 hrs after meals  Counseling  time on subjects discussed in assessment and plan sections is over 50% of today's 25 minute visit     Jason Mcpherson 12/14/2016, 11:58 AM   Note: This office note was prepared with Estate agent. Any transcriptional errors that result from this process are unintentional.

## 2016-12-20 NOTE — Progress Notes (Signed)
Please call to let patient know that the lab results are good including kidney test and cholesterol and no further action needed

## 2016-12-27 ENCOUNTER — Telehealth: Payer: Self-pay | Admitting: Endocrinology

## 2016-12-27 NOTE — Telephone Encounter (Signed)
**  Remind patient they can make refill requests via MyChart**  Medication refill request (Name & Dosage): [facility] DELICA LANCETS FINE MISC [[id number]]  ONE TOUCH ULTRA TEST test strip [[id number]]  metFORMIN (GLUCOPHAGE) 1000 MG tablet [[id number]]   LEVEMIR FLEXTOUCH 100 UNIT/ML Pen [[id number]]  Insulin Pen Needle (CAREFINE PEN NEEDLES) 32G X 4 MM MISC [[id number]]   furosemide (LASIX) 20 MG tablet [[id number]]   losartan (COZAAR) 100 MG tablet [[id number]]      Preferred pharmacy (Name & Address): [id number] - [place], [place] - [address] [phone] (Phone) [phone] (Fax)       Other comments (if applicable):

## 2016-12-28 ENCOUNTER — Other Ambulatory Visit: Payer: Self-pay

## 2016-12-28 MED ORDER — INSULIN PEN NEEDLE 32G X 4 MM MISC: 3 refills | Status: DC

## 2016-12-28 MED ORDER — LOSARTAN POTASSIUM 100 MG PO TABS: 100.0000 mg | ORAL_TABLET | Freq: Every day | ORAL | 1 refills | Status: DC

## 2016-12-28 MED ORDER — ONETOUCH DELICA LANCETS FINE MISC: 3 refills | Status: DC

## 2016-12-28 MED ORDER — FUROSEMIDE 20 MG PO TABS: 20.0000 mg | ORAL_TABLET | Freq: Every day | ORAL | 1 refills | Status: DC

## 2016-12-28 MED ORDER — METFORMIN HCL 1000 MG PO TABS: 1000.0000 mg | ORAL_TABLET | Freq: Two times a day (BID) | ORAL | 3 refills | Status: DC

## 2016-12-28 MED ORDER — INSULIN DETEMIR 100 UNIT/ML FLEXPEN: PEN_INJECTOR | SUBCUTANEOUS | 1 refills | Status: DC

## 2016-12-28 MED ORDER — GLUCOSE BLOOD VI STRP: ORAL_STRIP | 1 refills | Status: DC

## 2016-12-28 NOTE — Telephone Encounter (Signed)
Called patient and let him know that I have sent in [facility] medication [facility] for [facility].

## 2016-12-29 NOTE — Telephone Encounter (Signed)
Patient can not afford his Insulin Detemir (LEVEMIR FLEXTOUCH) 100 UNIT/ML Pen, it is $151. Call patient to discuss alternatives.

## 2016-12-30 NOTE — Telephone Encounter (Signed)
Is there an alternative that is cheaper please advise

## 2016-12-30 NOTE — Telephone Encounter (Signed)
Routing to you °

## 2016-12-30 NOTE — Telephone Encounter (Signed)
He can get the relion Novolin N from [facility] with the insulin vial and inject at bedtime.  However he will need to take only 14 units instead of 18 with this, will also need syringes for this, 0.3 mL

## 2016-12-30 NOTE — Telephone Encounter (Signed)
Spoke to the patients wife and gave her the information and she verbalized an understanding

## 2017-01-05 DIAGNOSIS — H25043 Posterior subcapsular polar age-related cataract, bilateral: Secondary | ICD-10-CM | POA: Diagnosis not present

## 2017-01-05 DIAGNOSIS — H2513 Age-related nuclear cataract, bilateral: Secondary | ICD-10-CM | POA: Diagnosis not present

## 2017-01-05 DIAGNOSIS — E119 Type 2 diabetes mellitus without complications: Secondary | ICD-10-CM | POA: Diagnosis not present

## 2017-02-02 ENCOUNTER — Other Ambulatory Visit: Payer: Self-pay | Admitting: Endocrinology

## 2017-03-15 ENCOUNTER — Encounter: Payer: Self-pay | Admitting: Endocrinology

## 2017-03-15 ENCOUNTER — Ambulatory Visit (INDEPENDENT_AMBULATORY_CARE_PROVIDER_SITE_OTHER): Payer: PPO | Admitting: Endocrinology

## 2017-03-15 ENCOUNTER — Other Ambulatory Visit: Payer: Self-pay | Admitting: Endocrinology

## 2017-03-15 VITALS — BP 122/62 | HR 82 | Ht 69.0 in | Wt 172.0 lb

## 2017-03-15 DIAGNOSIS — E1142 Type 2 diabetes mellitus with diabetic polyneuropathy: Secondary | ICD-10-CM

## 2017-03-15 DIAGNOSIS — H02839 Dermatochalasis of unspecified eye, unspecified eyelid: Secondary | ICD-10-CM | POA: Diagnosis not present

## 2017-03-15 DIAGNOSIS — H25013 Cortical age-related cataract, bilateral: Secondary | ICD-10-CM | POA: Diagnosis not present

## 2017-03-15 DIAGNOSIS — H2513 Age-related nuclear cataract, bilateral: Secondary | ICD-10-CM | POA: Diagnosis not present

## 2017-03-15 DIAGNOSIS — H2512 Age-related nuclear cataract, left eye: Secondary | ICD-10-CM | POA: Diagnosis not present

## 2017-03-15 DIAGNOSIS — H25043 Posterior subcapsular polar age-related cataract, bilateral: Secondary | ICD-10-CM | POA: Diagnosis not present

## 2017-03-15 LAB — COMPREHENSIVE METABOLIC PANEL
ALT: 11 U/L (ref 0–53)
AST: 15 U/L (ref 0–37)
Albumin: 4.3 g/dL (ref 3.5–5.2)
Alkaline Phosphatase: 64 U/L (ref 39–117)
BUN: 39 mg/dL — AB (ref 6–23)
CHLORIDE: 107 meq/L (ref 96–112)
CO2: 26 mEq/L (ref 19–32)
Calcium: 9.9 mg/dL (ref 8.4–10.5)
Creatinine, Ser: 1.47 mg/dL (ref 0.40–1.50)
GFR: 49.28 mL/min — ABNORMAL LOW (ref >60.00)
GLUCOSE: 172 mg/dL — AB (ref 70–99)
POTASSIUM: 4.4 meq/L (ref 3.5–5.1)
SODIUM: 141 meq/L (ref 135–145)
Total Bilirubin: 0.4 mg/dL (ref 0.2–1.2)
Total Protein: 7 g/dL (ref 6.0–8.3)

## 2017-03-15 LAB — POCT GLYCOSYLATED HEMOGLOBIN (HGB A1C): HEMOGLOBIN A1C: 5

## 2017-03-15 NOTE — Progress Notes (Signed)
Patient ID: Jason Mcpherson, male   DOB: 04-17-1941, 76 y.o.   MRN: [id number]    Reason for Appointment: follow-up for Type 2 Diabetes  History of Present Illness:          Diagnosis: Type 2 diabetes mellitus, date of diagnosis: 1997      Per Past history: The patient is unclear about his symptoms at diagnosis as well as initial treatment. Most likely has been on metformin for sometime and at some point was also given glipizide. He is also very unclear about when he was started on insulin and records from his previous endocrinologist are not available; may have been taking this over the last 15 years. Also not clear if he had been on different types of insulin However he was previously significantly overweight His A1c in 9/14 was 5.9 and in 2015 had been about 6.9-7 On his initial consultation he was taking metformin and glipizide ER; he was taking Levemir irregularly based on his blood sugar level Levemir was stopped on his initial consultation in 6/15 He was started on Januvia since 10/29/13 after stopping his Levemir insulin  His A1c in 6/17 had gone up to 7.5 and was started back on Levemir  Recent history:          INSULIN regimen = Novolin N, [employer] brand 10 units at 4 -5 pm  Oral hypoglycemic drugs the patient is taking are: Metformin 1500 mg daily, Amaryl 1 mg in the morning   A1c has been upper normal and is lower than expected at 5.0, previously 5.3  Current management and blood sugar patterns as well as problems:  Because of his high out-of-pocket expense for Januvia and Levemir he has had new prescriptions now  Insulin was switched in late August  Even though he was taking 18 units of Levemir previously with switching to NPH he was starting to get low sugars overnight and he has cut his doses down to 10 units instead of the recommended 14 units and last night only to 8 units  However he is taking the NPH at suppertime instead of bedtime  With this he has  had at least one episode of low blood sugars, recently documented at about 1:45 AM with a glucose of 57; he treats his low sugars with ice cream  At bedtime he may have a small candy at bedtime such as peanut butter cup  Also taking Amaryl 1 mg in the morning instead of Januvia now  With this his blood sugars after his evening meal/at bedtime are overall lower although still somewhat variable  FASTING blood sugars are excellent with no significant variability, however he has frequent readings in the 70s and his average sugar is only 92  He is tending to gain a little weight now   Side effects from medications have been: None  Glucose monitoring:  done less than once a day with One Touch monitor Blood Glucose readings from meter download recently:  Mean values apply above for all meters except median for One Touch  PRE-MEAL Fasting Lunch Dinner Bedtime Overall  Glucose range:  91-1 37    66-261    Mean/median: 90    122  102     Glycemic control:   Lab Results  Component Value Date   HGBA1C 5.3 12/14/2016   HGBA1C 6.3 08/03/2016   HGBA1C 6.0 05/04/2016   Lab Results  Component Value Date   MICROALBUR 1.5 08/03/2016   [place] 52 12/14/2016  CREATININE 1.42 12/14/2016    Self-care: The diet that the patient has been following is: tries to limit fats and sweets  He is sometimes eating cereal at breakfast. Usually eating a sandwich at lunch and meat and salad with beans at dinner. Have snacks with Jell-O.  Eating out about twice a week   Supper at 4:30-5 PM usually     Exercise: walks in his driveway or with shopping   Dietician visit:  years ago. Does have a diet plan at home              Weight history: Highest weight in the past has been 220 and lowest 157  Wt Readings from Last 3 Encounters:  03/15/17 172 lb (78 kg)  12/14/16 169 lb 3.2 oz (76.7 kg)  08/03/16 176 lb (79.8 kg)    OTHER problems discussed in review of systems    Allergies as of 03/15/2017     No Known Allergies     Medication List        Accurate as of 03/15/17  9:58 AM. Always use your most recent med list.          aspirin 81 MG chewable tablet Chew 81 mg by mouth daily.   betamethasone dipropionate 0.05 % cream Commonly known as:  DIPROLENE Apply topically 2 (two) times daily.   furosemide 20 MG tablet Commonly known as:  LASIX Take 1 tablet (20 mg total) by mouth daily.   glimepiride 1 MG tablet Commonly known as:  AMARYL Take 1 tablet (1 mg total) by mouth daily with breakfast.   glucose blood test strip Commonly known as:  ONE TOUCH ULTRA TEST USE AS DIRECTED TO TEST TWICE DAILY   Insulin Detemir 100 UNIT/ML Pen Commonly known as:  LEVEMIR FLEXTOUCH INJECT 18 UNITS INTO THE SKIN DAILY AT 10PM   Insulin Pen Needle 32G X 4 MM Misc Commonly known as:  CAREFINE PEN NEEDLES Inject insulin 1 time per day   JANUVIA 50 MG tablet Generic drug:  sitaGLIPtin TAKE 1 TABLET (50 MG TOTAL) BY MOUTH DAILY.   losartan 100 MG tablet Commonly known as:  COZAAR Take 1 tablet (100 mg total) by mouth daily.   metFORMIN 1000 MG tablet Commonly known as:  GLUCOPHAGE Take 1 tablet (1,000 mg total) by mouth 2 (two) times daily with a meal.   ONETOUCH DELICA LANCETS FINE Misc USED TO CHECK SUGAR 2X DAILY.   ONETOUCH DELICA LANCETS 78E Misc USED TO CHECK SUGAR 2X DAILY.   simvastatin 20 MG tablet Commonly known as:  ZOCOR Take 1 tablet (20 mg total) by mouth every evening.       Allergies: No Known Allergies  Past Medical History:  Diagnosis Date  . Anemia   . Arthritis    KNEES & BACK  . Cellulitis 08/2013   RT LEG  . Chronic kidney disease 08/2013   ACUTE RENAL   . Dehydration   . Diabetes mellitus without complication ([place])   . Hyperkalemia 08/2013  . Hyperlipemia   . Hypertension     Past Surgical History:  Procedure Laterality Date  . BACK SURGERY     10 years ago  . CERVICAL DISC SURGERY      Family History  Problem Relation Age of  Onset  . Cancer - Colon Mother   . Cancer - Prostate Father     Social History:  reports that he quit smoking about 19 years ago. he has never used smokeless tobacco. He reports  that he does not drink alcohol or use drugs.    Review of Systems    RENAL dysfunction: Probably diabetes/hypertension related and has been followed by nephrologist Last creatinine relatively better  Lab Results  Component Value Date   CREATININE 1.42 12/14/2016   CREATININE 1.54 (H) 08/03/2016   CREATININE 1.54 (H) 05/04/2016   CREATININE 1.82 (H) 10/15/2015         Lipids: treated with Zocor 20 mg, last lipids as follows:       Lab Results  Component Value Date   CHOL 138 12/14/2016   HDL 73.70 12/14/2016   LDLCALC 52 12/14/2016   LDLDIRECT 48.9 02/21/2014   TRIG 63.0 12/14/2016   CHOLHDL 2 12/14/2016    HYPERTENSION: Has been treated with losartan 100 mg daily, also followed by nephrologist  He is also taking Lasix for pedal edema    Physical Examination:  BP 122/62   Pulse 82   Ht 5\' 9"  (1.753 m)   Wt 172 lb (78 kg)   SpO2 97%   BMI 25.40 kg/m   No significant ankle edema No ulcerations or skin lesions on the distal feet or plantar surfaces   ASSESSMENT:   Diabetes type 2, normal BMI  See history of present illness for detailed discussion of current diabetes management, blood sugar patterns and problems identified  A1c it is lower than expected at 5 Blood sugars may be low normal and sometimes low with using Amaryl and NPH instead of Levemir and Januvia and is requiring less insulin also However he has not followed instructions for taking the NPH at bedtime and takes the insulin at suppertime Postprandial readings at night or fairly good but he needs to check some readings after other meals as discussed previously  Hypertension: His blood pressure iscontrolled Taking 100 mg losartan   Edema: Controlled fairly wellwith Lasix  History of foot ulcers: Has no skin  lesions on his feet again  RENAL dysfunction:needs follow-up levels today  Hyperlipidemia: needs follow-up periodically     PLAN:   He will reduce his insulin to at least 8 units and if still getting low sugars overnight cut down to 6 units  He needs to have a bedtime snack with protein daily  Must take his insulin at bedtime instead of suppertime  Continue Amaryl unchanged  Check renal function today  Check fructosamine to correlate his A1c readings   Patient Instructions  Take 6-8 units at bedtime      Raudel Bazen 03/15/2017, 9:58 AM   Note: This office note was prepared with Estate agent. Any transcriptional errors that result from this process are unintentional.

## 2017-03-15 NOTE — Patient Instructions (Addendum)
Take 6-8 units at bedtime of insulin  Bedtime snack

## 2017-03-16 LAB — FRUCTOSAMINE: Fructosamine: 252 umol/L (ref 0–285)

## 2017-04-05 DIAGNOSIS — N183 Chronic kidney disease, stage 3 (moderate): Secondary | ICD-10-CM | POA: Diagnosis not present

## 2017-04-05 DIAGNOSIS — E875 Hyperkalemia: Secondary | ICD-10-CM | POA: Diagnosis not present

## 2017-04-05 DIAGNOSIS — I129 Hypertensive chronic kidney disease with stage 1 through stage 4 chronic kidney disease, or unspecified chronic kidney disease: Secondary | ICD-10-CM | POA: Diagnosis not present

## 2017-05-14 DIAGNOSIS — R69 Illness, unspecified: Secondary | ICD-10-CM | POA: Diagnosis not present

## 2017-05-16 DIAGNOSIS — H2512 Age-related nuclear cataract, left eye: Secondary | ICD-10-CM | POA: Diagnosis not present

## 2017-05-17 DIAGNOSIS — H2511 Age-related nuclear cataract, right eye: Secondary | ICD-10-CM | POA: Diagnosis not present

## 2017-05-30 DIAGNOSIS — H2511 Age-related nuclear cataract, right eye: Secondary | ICD-10-CM | POA: Diagnosis not present

## 2017-06-03 ENCOUNTER — Other Ambulatory Visit: Payer: Self-pay

## 2017-06-03 MED ORDER — GLIMEPIRIDE 1 MG PO TABS: 1.0000 mg | ORAL_TABLET | Freq: Every day | ORAL | 2 refills | Status: DC

## 2017-06-15 ENCOUNTER — Ambulatory Visit: Payer: PPO | Admitting: Endocrinology

## 2017-06-20 ENCOUNTER — Other Ambulatory Visit: Payer: Self-pay | Admitting: Endocrinology

## 2017-06-29 DIAGNOSIS — R69 Illness, unspecified: Secondary | ICD-10-CM | POA: Diagnosis not present

## 2017-06-29 DIAGNOSIS — Z0100 Encounter for examination of eyes and vision without abnormal findings: Secondary | ICD-10-CM | POA: Diagnosis not present

## 2017-06-30 ENCOUNTER — Other Ambulatory Visit: Payer: Self-pay | Admitting: Endocrinology

## 2017-06-30 DIAGNOSIS — R69 Illness, unspecified: Secondary | ICD-10-CM | POA: Diagnosis not present

## 2017-07-13 ENCOUNTER — Ambulatory Visit: Payer: PPO | Admitting: Endocrinology

## 2017-07-25 ENCOUNTER — Ambulatory Visit: Payer: PPO | Admitting: Endocrinology

## 2017-08-08 DIAGNOSIS — J302 Other seasonal allergic rhinitis: Secondary | ICD-10-CM | POA: Diagnosis not present

## 2017-08-11 DIAGNOSIS — H66002 Acute suppurative otitis media without spontaneous rupture of ear drum, left ear: Secondary | ICD-10-CM | POA: Diagnosis not present

## 2017-08-11 DIAGNOSIS — H6122 Impacted cerumen, left ear: Secondary | ICD-10-CM | POA: Diagnosis not present

## 2017-08-22 DIAGNOSIS — Z7982 Long term (current) use of aspirin: Secondary | ICD-10-CM | POA: Diagnosis not present

## 2017-08-22 DIAGNOSIS — J309 Allergic rhinitis, unspecified: Secondary | ICD-10-CM | POA: Diagnosis not present

## 2017-08-22 DIAGNOSIS — E119 Type 2 diabetes mellitus without complications: Secondary | ICD-10-CM | POA: Diagnosis not present

## 2017-08-22 DIAGNOSIS — I10 Essential (primary) hypertension: Secondary | ICD-10-CM | POA: Diagnosis not present

## 2017-08-22 DIAGNOSIS — Z87891 Personal history of nicotine dependence: Secondary | ICD-10-CM | POA: Diagnosis not present

## 2017-08-22 DIAGNOSIS — Z809 Family history of malignant neoplasm, unspecified: Secondary | ICD-10-CM | POA: Diagnosis not present

## 2017-08-22 DIAGNOSIS — R609 Edema, unspecified: Secondary | ICD-10-CM | POA: Diagnosis not present

## 2017-08-22 DIAGNOSIS — E785 Hyperlipidemia, unspecified: Secondary | ICD-10-CM | POA: Diagnosis not present

## 2017-08-22 DIAGNOSIS — Z794 Long term (current) use of insulin: Secondary | ICD-10-CM | POA: Diagnosis not present

## 2017-08-27 ENCOUNTER — Other Ambulatory Visit: Payer: Self-pay | Admitting: Endocrinology

## 2017-08-28 DIAGNOSIS — R69 Illness, unspecified: Secondary | ICD-10-CM | POA: Diagnosis not present

## 2017-09-06 ENCOUNTER — Other Ambulatory Visit: Payer: Self-pay | Admitting: Endocrinology

## 2017-09-25 DIAGNOSIS — R69 Illness, unspecified: Secondary | ICD-10-CM | POA: Diagnosis not present

## 2017-10-21 DIAGNOSIS — R69 Illness, unspecified: Secondary | ICD-10-CM | POA: Diagnosis not present

## 2017-10-30 NOTE — Progress Notes (Signed)
Patient ID: Jason Mcpherson, male   DOB: 10-15-1940, 77 y.o.   MRN: [id number]    Reason for Appointment: follow-up for Type 2 Diabetes  History of Present Illness:          Diagnosis: Type 2 diabetes mellitus, date of diagnosis: 1997      Per Past history: The patient is unclear about his symptoms at diagnosis as well as initial treatment. Most likely has been on metformin for sometime and at some point was also given glipizide. He is also very unclear about when he was started on insulin and records from his previous endocrinologist are not available; may have been taking this over the last 15 years. Also not clear if he had been on different types of insulin However he was previously significantly overweight His A1c in 9/14 was 5.9 and in 2015 had been about 6.9-7 On his initial consultation he was taking metformin and glipizide ER; he was taking Levemir irregularly based on his blood sugar level Levemir was stopped on his initial consultation in 6/15 He was started on Januvia since 10/29/13 after stopping his Levemir insulin  His A1c in 6/17 had gone up to 7.5 and was started back on Levemir  Recent history:          INSULIN regimen = Novolin N, [employer] brand 10 units at 7 PM  Oral hypoglycemic drugs the patient is taking are: Metformin 2000 mg daily, Amaryl 1 mg in the morning   A1c has been upper normal and is lower than expected at 5.4 and usually about the same  Current management and blood sugar patterns as well as problems:  He has not followed up since his last visit in 11/18  He thinks he is taking his evening insulin later at night and not at suppertime as directed but still has a couple of readings in the 60s late evening after about 10 PM  Most likely because of his variable carbohydrate intake and meal size his blood sugars after supper fluctuate considerably as before but on an average are still fairly good  He does not check readings after breakfast or  lunch and still continues to check readings in the mornings and at night  He has gained 10 pounds from his last visit when he does not know why, not having any edema today  He was supposed to be on 1500 mg of metformin but he takes 1 g twice a day now  Also he was supposed to start cutting back on his evening insulin to at least 8 or even 6 units but he still takes 10 units, may be possibly taking more when his sugars are relatively high  With this his FASTING blood sugars are at times low normal and averaging below 100   Side effects from medications have been: None  Glucose monitoring:  done less than once a day with One Touch monitor Blood Glucose readings from meter download recently:  Mean values apply above for all meters except median for One Touch  PRE-MEAL Fasting Lunch Dinner  late evening Overall  Glucose range:  74-149    61, 64   Mean/median:  96       POST-MEAL PC Breakfast PC Lunch PC Dinner  Glucose range:    61-261  Mean/median:   147    Previous readings:  Mean values apply above for all meters except median for One Touch  PRE-MEAL Fasting Lunch Dinner Bedtime Overall  Glucose range:  91-1  2    66-261    Mean/median: 90    122  102     Glycemic control:   Lab Results  Component Value Date   HGBA1C 5.4 10/31/2017   HGBA1C 5.0 03/15/2017   HGBA1C 5.3 12/14/2016   Lab Results  Component Value Date   MICROALBUR 1.5 08/03/2016   LDLCALC 52 12/14/2016   CREATININE 1.47 03/15/2017    Self-care: The diet that the patient has been following is: tries to limit fats and sweets  He is sometimes eating cereal at breakfast. Usually eating a sandwich at lunch and meat and salad with beans at dinner. Have snacks with Jell-O.  Eating out about twice a week   Supper at 4:30-5 PM usually     Exercise: walks in his driveway or with shopping   Dietician visit:  years ago. Does have a diet plan at home              Weight history: Highest weight in the past  has been 220 and lowest 157  Wt Readings from Last 3 Encounters:  10/31/17 182 lb 6.4 oz (82.7 kg)  03/15/17 172 lb (78 kg)  12/14/16 169 lb 3.2 oz (76.7 kg)    OTHER problems discussed in review of systems    Allergies as of 10/31/2017   No Known Allergies     Medication List        Accurate as of 10/31/17  3:06 PM. Always use your most recent med list.          aspirin 81 MG chewable tablet Chew 81 mg by mouth daily.   betamethasone dipropionate 0.05 % cream Commonly known as:  DIPROLENE Apply topically 2 (two) times daily.   furosemide 20 MG tablet Commonly known as:  LASIX TAKE 1 TABLET BY MOUTH EVERY DAY   glimepiride 1 MG tablet Commonly known as:  AMARYL Take 1 tablet (1 mg total) by mouth daily with breakfast.   glucose blood test strip Commonly known as:  ONE TOUCH ULTRA TEST USE AS DIRECTED TO TEST TWICE DAILY   Insulin Pen Needle 32G X 4 MM Misc Commonly known as:  CAREFINE PEN NEEDLES Inject insulin 1 time per day   losartan 100 MG tablet Commonly known as:  COZAAR TAKE 1 TABLET BY MOUTH EVERY DAY   metFORMIN 1000 MG tablet Commonly known as:  GLUCOPHAGE Take 1 tablet (1,000 mg total) by mouth 2 (two) times daily with a meal.   NOVOLIN N RELION 100 UNIT/ML injection Generic drug:  insulin NPH Human Inject 10 Units at bedtime into the skin.   ONETOUCH DELICA LANCETS FINE Misc USED TO CHECK SUGAR 2X DAILY.   simvastatin 20 MG tablet Commonly known as:  ZOCOR Take 1 tablet (20 mg total) by mouth every evening.       Allergies: No Known Allergies  Past Medical History:  Diagnosis Date  . Anemia   . Arthritis    KNEES & BACK  . Cellulitis 08/2013   RT LEG  . Chronic kidney disease 08/2013   ACUTE RENAL   . Dehydration   . Diabetes mellitus without complication ([place])   . Hyperkalemia 08/2013  . Hyperlipemia   . Hypertension     Past Surgical History:  Procedure Laterality Date  . ANTERIOR CERVICAL DECOMP/DISCECTOMY FUSION N/A  10/17/2012   Procedure: ANTERIOR CERVICAL DECOMPRESSION/DISCECTOMY FUSION 2 LEVELS;  Surgeon: Charlie Pitter, MD;  Location: [facility];  Service: Neurosurgery;  Laterality: N/A;  Cervical  three-four, four-five anterior cervical decompression fusion with allograft plating  . BACK SURGERY     10 years ago  . CERVICAL DISC SURGERY      Family History  Problem Relation Age of Onset  . Cancer - Colon Mother   . Cancer - Prostate Father     Social History:  reports that he quit smoking about 20 years ago. He has never used smokeless tobacco. He reports that he does not drink alcohol or use drugs.    Review of Systems    RENAL dysfunction: Probably diabetes/hypertension related and has been followed by nephrologist Last creatinine upper normal  Lab Results  Component Value Date   CREATININE 1.47 03/15/2017   CREATININE 1.42 12/14/2016   CREATININE 1.54 (H) 08/03/2016   CREATININE 1.54 (H) 05/04/2016         Lipids: treated with Zocor 20 mg, last lipids as follows:       Lab Results  Component Value Date   CHOL 138 12/14/2016   HDL 73.70 12/14/2016   LDLCALC 52 12/14/2016   LDLDIRECT 48.9 02/21/2014   TRIG 63.0 12/14/2016   CHOLHDL 2 12/14/2016    HYPERTENSION: Has been treated with losartan 100 mg daily He says he has not been seen by his nephrologist and does not see his PCP regularly Blood pressure was high on his first measurements  He is also taking Lasix for pedal edema with good control    Physical Examination:  BP 140/70   Pulse 82   Ht 5\' 9"  (1.753 m)   Wt 182 lb 6.4 oz (82.7 kg)   SpO2 96%   BMI 26.94 kg/m   Trace lower leg edema No ulcerations or calluses on the distal feet or plantar surfaces  Diabetic Foot Exam - Simple   Simple Foot Form Diabetic Foot exam was performed with the following findings:  Yes   Visual Inspection See comments:  Yes Sensation Testing See comments:  Yes Pulse Check See comments:  Yes Comments Hammertoes present  and some deformities of the joints No calluses or ulcerations on the toes or plantar surfaces Mild onychomycosis present Decreased to absent monofilament sensation on the left distal plantar surfaces and toes, are relatively normal on the distal right toes Posterior tibialis pulses difficult to palpate bilaterally      ASSESSMENT:   Diabetes type 2, now BMI 27 See history of present illness for detailed discussion of current diabetes management, blood sugar patterns and problems identified  A1c it is lower than expected at 5.4 again However his fasting blood sugars are averaging below 100 and he has occasional readings in the 60s late at night He has no changes insulin regimen as directed on the last visit Still benefiting from taking metformin and low-dose Amaryl and on an average readings at night after supper are not consistently high although quite variable based on his diet He is continuing to gain weight Because of his various joint pains and neuropathy he has difficulty doing much exercise   Hypertension: His blood pressure is fairly well controlled although may be higher from whitecoat syndrome today  Taking 100 mg losartan and will continue, currently not monitoring at home, discussed blood pressure target of at least under 140  Edema: Controlled fairly well with Lasix  History of foot ulcers: Has no skin lesions on his feet again  RENAL dysfunction:needs follow-up creatinine levels today  Hyperlipidemia: needs follow-up level today  Handicap form filled out for him for [facility]  PLAN:   He will reduce his insulin to at least 8 units and take it as late as possible at night  To check some readings after breakfast or lunch  Consistent balanced meals at suppertime  No change in metformin unless renal function is abnormal today  Discussed general principles of foot care  More regular follow-up  Annual eye exam recommended  Annual foot exam  recommended   Patient Instructions  Insulin 8 units at nite not 10  Counseling time on subjects discussed in assessment and plan sections is over 50% of today's 25 minute visit     Elayne Snare 10/31/2017, 3:06 PM   Note: This office note was prepared with Dragon voice recognition system technology. Any transcriptional errors that result from this process are unintentional.

## 2017-10-31 ENCOUNTER — Ambulatory Visit (INDEPENDENT_AMBULATORY_CARE_PROVIDER_SITE_OTHER): Payer: Medicare HMO | Admitting: Endocrinology

## 2017-10-31 ENCOUNTER — Encounter: Payer: Self-pay | Admitting: Endocrinology

## 2017-10-31 VITALS — BP 140/70 | HR 82 | Ht 69.0 in | Wt 182.4 lb

## 2017-10-31 DIAGNOSIS — Z794 Long term (current) use of insulin: Secondary | ICD-10-CM | POA: Diagnosis not present

## 2017-10-31 DIAGNOSIS — E1165 Type 2 diabetes mellitus with hyperglycemia: Secondary | ICD-10-CM

## 2017-10-31 DIAGNOSIS — I10 Essential (primary) hypertension: Secondary | ICD-10-CM | POA: Diagnosis not present

## 2017-10-31 DIAGNOSIS — N183 Chronic kidney disease, stage 3 unspecified: Secondary | ICD-10-CM

## 2017-10-31 DIAGNOSIS — E1142 Type 2 diabetes mellitus with diabetic polyneuropathy: Secondary | ICD-10-CM

## 2017-10-31 LAB — COMPREHENSIVE METABOLIC PANEL
ALBUMIN: 4.3 g/dL (ref 3.5–5.2)
ALT: 10 U/L (ref 0–53)
AST: 13 U/L (ref 0–37)
Alkaline Phosphatase: 69 U/L (ref 39–117)
BUN: 41 mg/dL — AB (ref 6–23)
CHLORIDE: 108 meq/L (ref 96–112)
CO2: 28 mEq/L (ref 19–32)
Calcium: 9.7 mg/dL (ref 8.4–10.5)
Creatinine, Ser: 1.5 mg/dL (ref 0.40–1.50)
GFR: 48.07 mL/min — AB (ref >60.00)
Glucose, Bld: 59 mg/dL — ABNORMAL LOW (ref 70–99)
POTASSIUM: 4.5 meq/L (ref 3.5–5.1)
Sodium: 144 mEq/L (ref 135–145)
Total Bilirubin: 0.3 mg/dL (ref 0.2–1.2)
Total Protein: 7 g/dL (ref 6.0–8.3)

## 2017-10-31 LAB — CBC
HCT: 32.9 % — ABNORMAL LOW (ref 39.0–52.0)
HEMOGLOBIN: 10.9 g/dL — AB (ref 13.0–17.0)
MCHC: 33.2 g/dL (ref 30.0–36.0)
MCV: 94.3 fl (ref 78.0–100.0)
Platelets: 233 10*3/uL (ref 150.0–400.0)
RBC: 3.49 Mil/uL — ABNORMAL LOW (ref 4.22–5.81)
RDW: 14.2 % (ref 11.5–15.5)
WBC: 6.1 10*3/uL (ref 4.0–10.5)

## 2017-10-31 LAB — POCT GLYCOSYLATED HEMOGLOBIN (HGB A1C): HEMOGLOBIN A1C: 5.4 % (ref 4.0–5.6)

## 2017-10-31 LAB — LIPID PANEL
Cholesterol: 150 mg/dL (ref 0–200)
HDL: 69.3 mg/dL (ref >39.00)
LDL Cholesterol: 65 mg/dL (ref 0–99)
NONHDL: 81.07
Total CHOL/HDL Ratio: 2
Triglycerides: 79 mg/dL (ref 0.0–149.0)
VLDL: 15.8 mg/dL (ref 0.0–40.0)

## 2017-10-31 LAB — MICROALBUMIN / CREATININE URINE RATIO
Creatinine,U: 59.3 mg/dL
MICROALB/CREAT RATIO: 2.8 mg/g (ref 0.0–30.0)
Microalb, Ur: 1.6 mg/dL (ref 0.0–1.9)

## 2017-10-31 NOTE — Patient Instructions (Signed)
Insulin 8 units at nite not 10

## 2017-11-14 IMAGING — US US RENAL
1 series · 14 of 25 positions shown · non-contrast
Comparison: None.

CLINICAL DATA: Chronic stage 3 kidney disease.

EXAM:
RENAL / URINARY TRACT ULTRASOUND COMPLETE

[Series 1: us renal · 0.24mm/px · 14 of 33 slices shown]
[im 1/33]
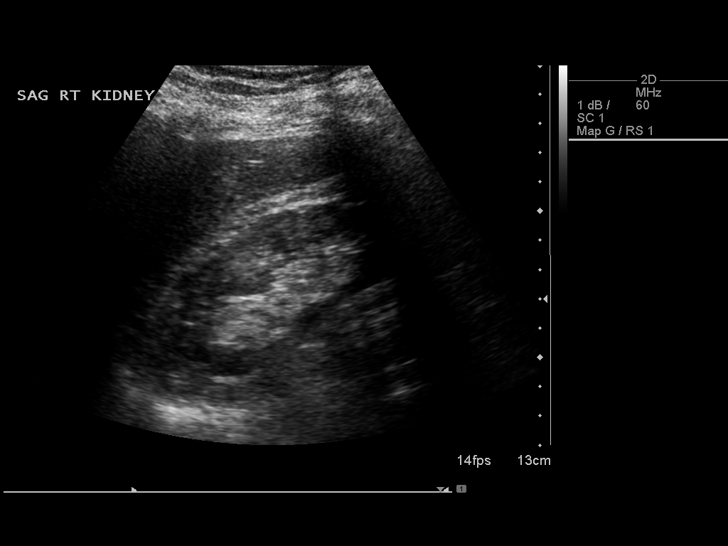
[im 3/33]
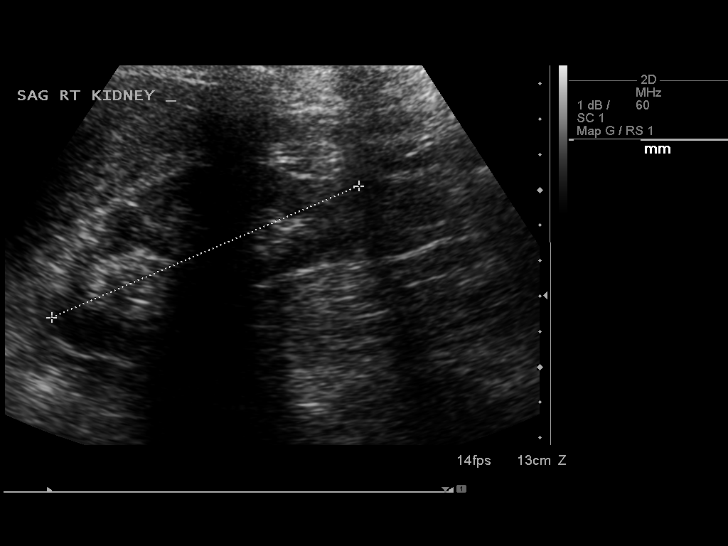
[im 6/33]
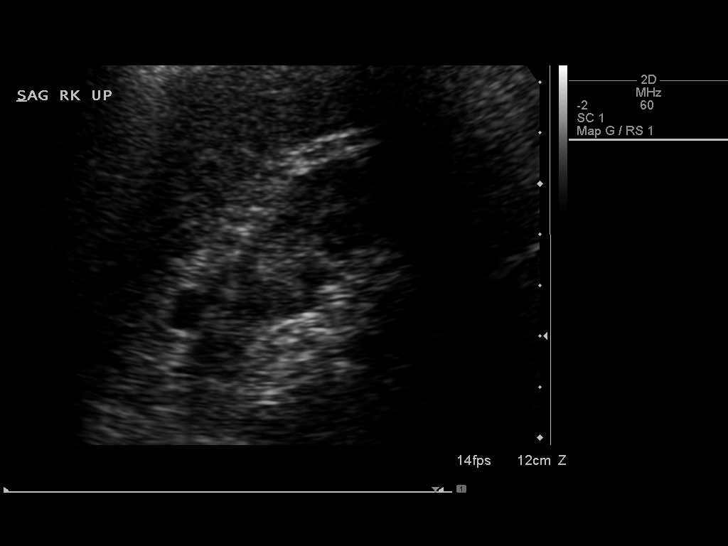
[im 9/33]
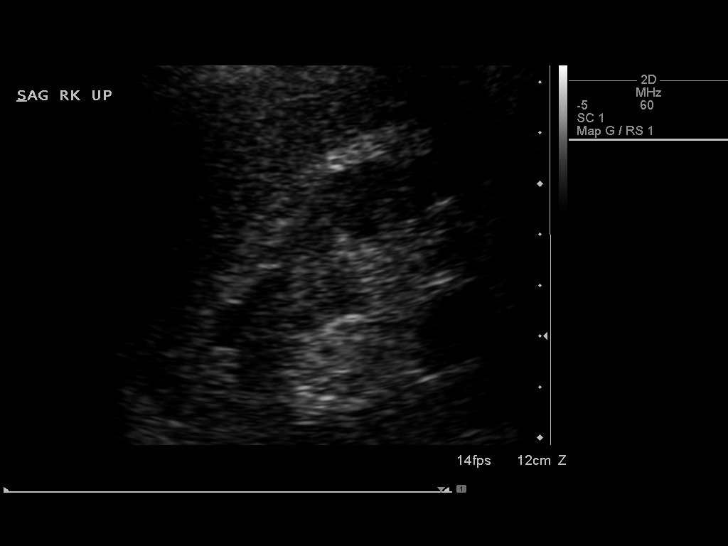
[im 11/33]
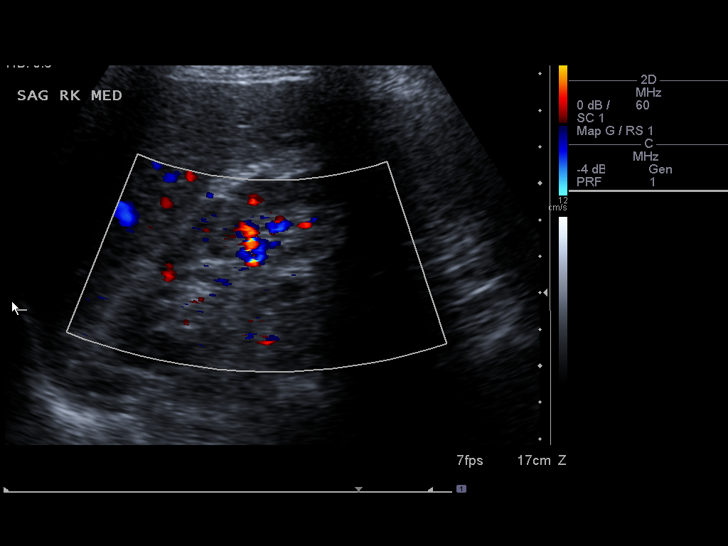
[im 13/33]
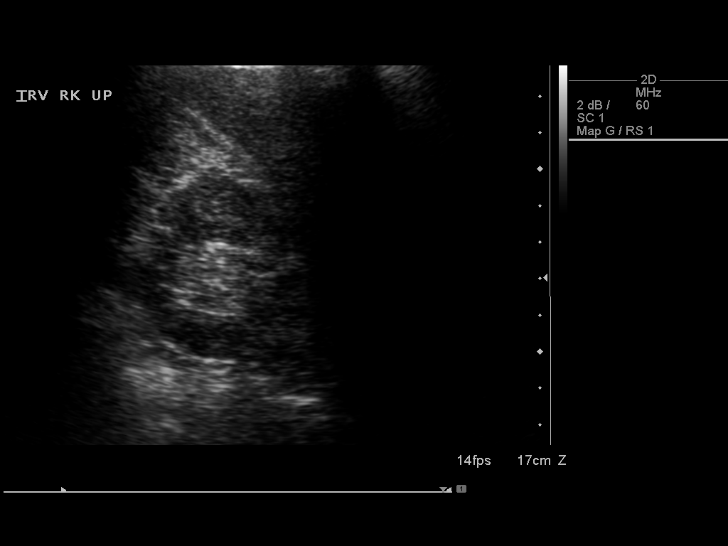
[im 15/33]
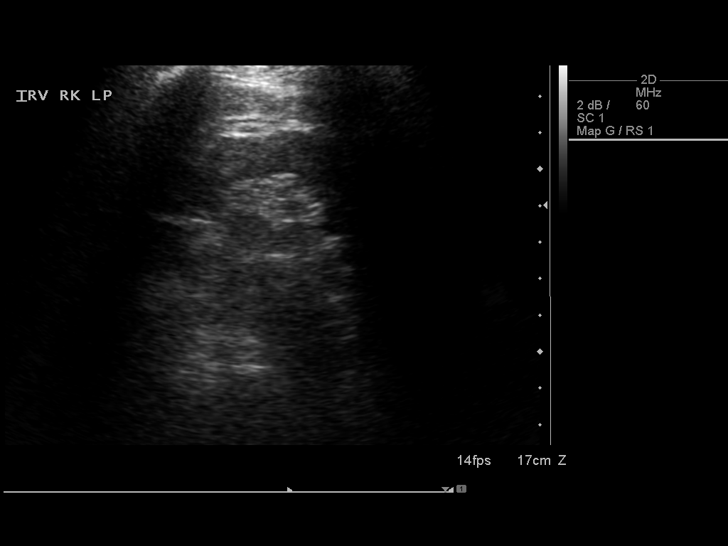
[im 18/33]
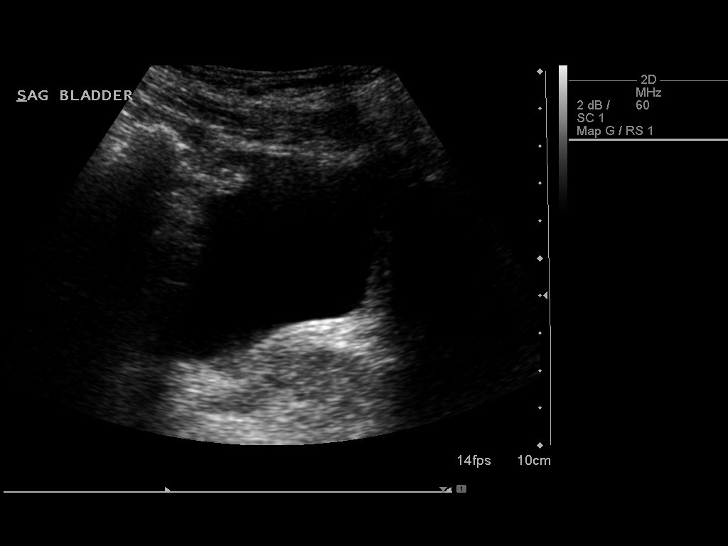
[im 21/33]
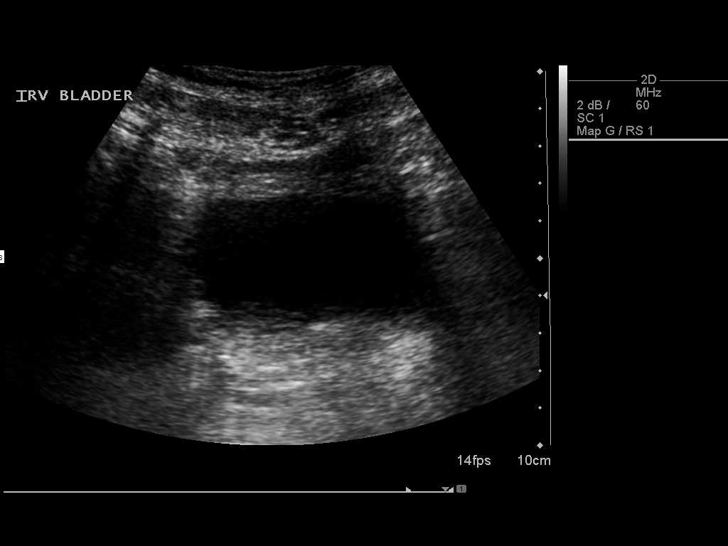
[im 22/33]
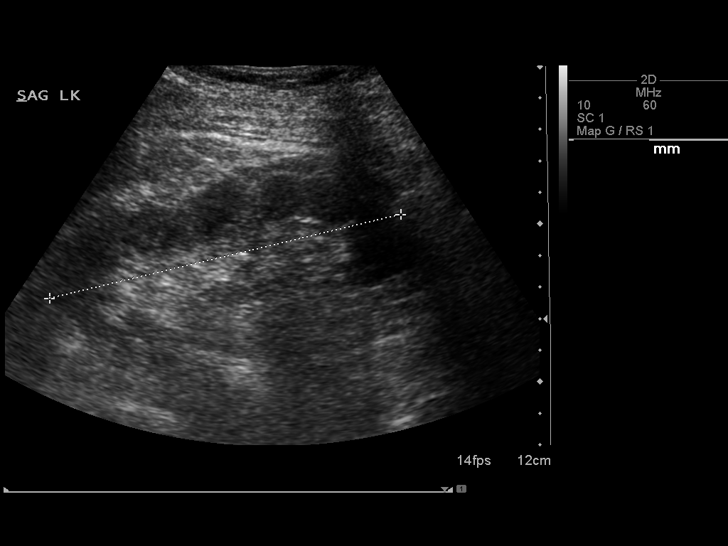
[im 25/33]
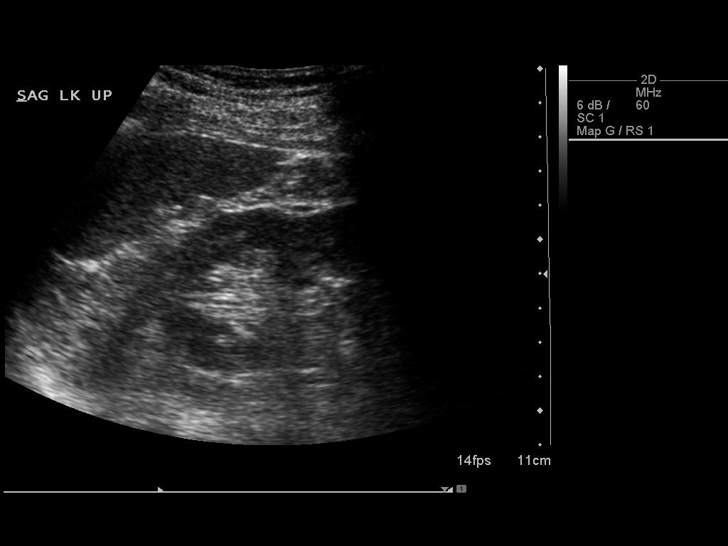
[im 27/33]
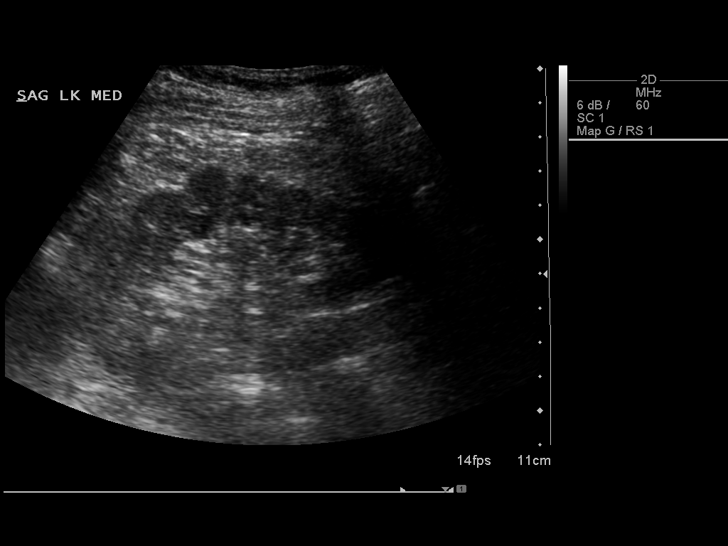
[im 30/33]
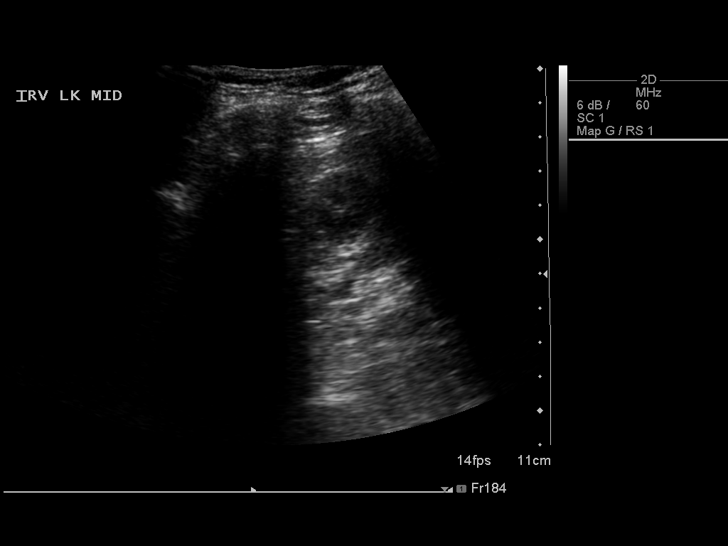
[im 33/33]
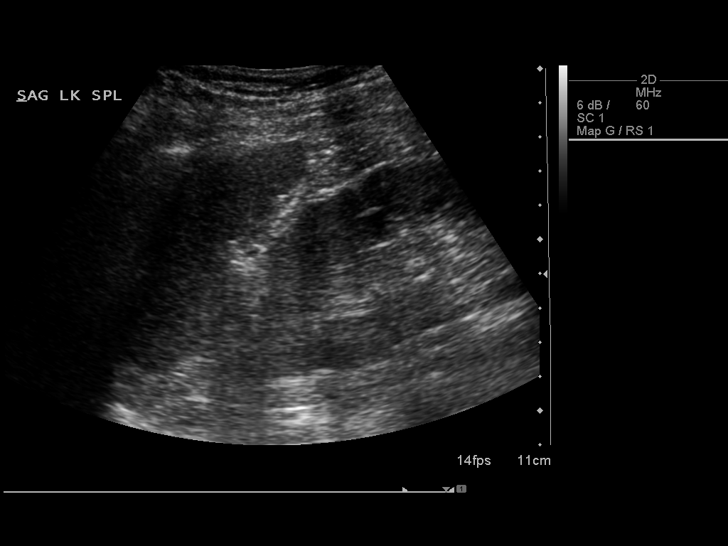

[14 of 25 positions shown; findings below may reference images not displayed]

FINDINGS: Right Kidney:

Length: 9.4 cm. 1.0 cm exophytic upper pole cyst. Normal
echogenicity. No hydronephrosis.

Left Kidney:

Length: 11.4 cm. Echogenicity within normal limits. No mass or
hydronephrosis visualized.

Bladder:

Appears normal for degree of bladder distention.
IMPRESSION: No significant abnormality.

## 2017-12-15 ENCOUNTER — Other Ambulatory Visit: Payer: Self-pay | Admitting: Endocrinology

## 2017-12-20 ENCOUNTER — Other Ambulatory Visit: Payer: Self-pay | Admitting: Endocrinology

## 2017-12-20 DIAGNOSIS — R69 Illness, unspecified: Secondary | ICD-10-CM | POA: Diagnosis not present

## 2017-12-21 ENCOUNTER — Other Ambulatory Visit: Payer: Self-pay | Admitting: Endocrinology

## 2017-12-21 DIAGNOSIS — R69 Illness, unspecified: Secondary | ICD-10-CM | POA: Diagnosis not present

## 2017-12-22 ENCOUNTER — Other Ambulatory Visit: Payer: Self-pay | Admitting: Endocrinology

## 2018-01-04 DIAGNOSIS — E7800 Pure hypercholesterolemia, unspecified: Secondary | ICD-10-CM | POA: Diagnosis not present

## 2018-01-04 DIAGNOSIS — Z794 Long term (current) use of insulin: Secondary | ICD-10-CM | POA: Diagnosis not present

## 2018-01-04 DIAGNOSIS — E1122 Type 2 diabetes mellitus with diabetic chronic kidney disease: Secondary | ICD-10-CM | POA: Diagnosis not present

## 2018-01-04 DIAGNOSIS — I10 Essential (primary) hypertension: Secondary | ICD-10-CM | POA: Diagnosis not present

## 2018-01-04 DIAGNOSIS — E1140 Type 2 diabetes mellitus with diabetic neuropathy, unspecified: Secondary | ICD-10-CM | POA: Diagnosis not present

## 2018-01-04 DIAGNOSIS — N183 Chronic kidney disease, stage 3 (moderate): Secondary | ICD-10-CM | POA: Diagnosis not present

## 2018-02-11 ENCOUNTER — Other Ambulatory Visit: Payer: Self-pay | Admitting: Endocrinology

## 2018-02-13 DIAGNOSIS — R69 Illness, unspecified: Secondary | ICD-10-CM | POA: Diagnosis not present

## 2018-02-14 DIAGNOSIS — R69 Illness, unspecified: Secondary | ICD-10-CM | POA: Diagnosis not present

## 2018-02-16 ENCOUNTER — Other Ambulatory Visit: Payer: Self-pay | Admitting: Endocrinology

## 2018-02-16 DIAGNOSIS — R69 Illness, unspecified: Secondary | ICD-10-CM | POA: Diagnosis not present

## 2018-03-02 ENCOUNTER — Other Ambulatory Visit: Payer: Self-pay | Admitting: Endocrinology

## 2018-03-13 ENCOUNTER — Encounter: Payer: Self-pay | Admitting: Endocrinology

## 2018-03-13 ENCOUNTER — Ambulatory Visit (INDEPENDENT_AMBULATORY_CARE_PROVIDER_SITE_OTHER): Payer: Medicare HMO | Admitting: Endocrinology

## 2018-03-13 VITALS — BP 136/74 | HR 74 | Ht 68.0 in | Wt 181.0 lb

## 2018-03-13 DIAGNOSIS — N182 Chronic kidney disease, stage 2 (mild): Secondary | ICD-10-CM | POA: Diagnosis not present

## 2018-03-13 DIAGNOSIS — E1165 Type 2 diabetes mellitus with hyperglycemia: Secondary | ICD-10-CM | POA: Diagnosis not present

## 2018-03-13 DIAGNOSIS — D649 Anemia, unspecified: Secondary | ICD-10-CM | POA: Diagnosis not present

## 2018-03-13 DIAGNOSIS — Z794 Long term (current) use of insulin: Secondary | ICD-10-CM | POA: Diagnosis not present

## 2018-03-13 LAB — CBC WITH DIFFERENTIAL/PLATELET
Basophils Absolute: 0.1 10*3/uL (ref 0.0–0.1)
Basophils Relative: 1.2 % (ref 0.0–3.0)
Eosinophils Absolute: 0.3 10*3/uL (ref 0.0–0.7)
Eosinophils Relative: 4.6 % (ref 0.0–5.0)
HEMATOCRIT: 33 % — AB (ref 39.0–52.0)
Hemoglobin: 10.9 g/dL — ABNORMAL LOW (ref 13.0–17.0)
LYMPHS PCT: 9.2 % — AB (ref 12.0–46.0)
Lymphs Abs: 0.6 10*3/uL — ABNORMAL LOW (ref 0.7–4.0)
MCHC: 33 g/dL (ref 30.0–36.0)
MCV: 95.2 fl (ref 78.0–100.0)
Monocytes Absolute: 0.7 10*3/uL (ref 0.1–1.0)
Monocytes Relative: 9.6 % (ref 3.0–12.0)
NEUTROS ABS: 5.3 10*3/uL (ref 1.4–7.7)
Neutrophils Relative %: 75.4 % (ref 43.0–77.0)
PLATELETS: 173 10*3/uL (ref 150.0–400.0)
RBC: 3.46 Mil/uL — ABNORMAL LOW (ref 4.22–5.81)
RDW: 14.2 % (ref 11.5–15.5)
WBC: 7 10*3/uL (ref 4.0–10.5)

## 2018-03-13 LAB — POCT GLYCOSYLATED HEMOGLOBIN (HGB A1C): HEMOGLOBIN A1C: 4.9 % (ref 4.0–5.6)

## 2018-03-13 LAB — COMPREHENSIVE METABOLIC PANEL
ALBUMIN: 4.3 g/dL (ref 3.5–5.2)
ALT: 9 U/L (ref 0–53)
AST: 14 U/L (ref 0–37)
Alkaline Phosphatase: 54 U/L (ref 39–117)
BUN: 41 mg/dL — ABNORMAL HIGH (ref 6–23)
CHLORIDE: 110 meq/L (ref 96–112)
CO2: 24 mEq/L (ref 19–32)
CREATININE: 1.52 mg/dL — AB (ref 0.40–1.50)
Calcium: 9.9 mg/dL (ref 8.4–10.5)
GFR: 47.3 mL/min — ABNORMAL LOW (ref >60.00)
Glucose, Bld: 83 mg/dL (ref 70–99)
Potassium: 4.7 mEq/L (ref 3.5–5.1)
SODIUM: 142 meq/L (ref 135–145)
Total Bilirubin: 0.5 mg/dL (ref 0.2–1.2)
Total Protein: 6.8 g/dL (ref 6.0–8.3)

## 2018-03-13 LAB — IBC PANEL
Iron: 69 ug/dL (ref 42–165)
Saturation Ratios: 22.8 % (ref 20.0–50.0)
Transferrin: 216 mg/dL (ref 212.0–360.0)

## 2018-03-13 NOTE — Progress Notes (Signed)
Patient ID: Jason Mcpherson, male   DOB: [birth year], 77 y.o.   MRN: [id number]    Reason for Appointment: follow-up    History of Present Illness:          Diagnosis: Type 2 diabetes mellitus, date of diagnosis: 1997      Per Past history: The patient is unclear about his symptoms at diagnosis as well as initial treatment. Most likely has been on metformin for sometime and at some point was also given glipizide. He is also very unclear about when he was started on insulin and records from his previous endocrinologist are not available; may have been taking this over the last 15 years. Also not clear if he had been on different types of insulin However he was previously significantly overweight His A1c in 9/14 was 5.9 and in 2015 had been about 6.9-7 On his initial consultation he was taking metformin and glipizide ER; he was taking Levemir irregularly based on his blood sugar level Levemir was stopped on his initial consultation in 6/15 He was started on Januvia since 10/29/13 after stopping his Levemir insulin  His A1c in 6/17 had gone up to 7.5 and was started back on Levemir  Recent history:          INSULIN regimen = Novolin N, [employer] brand 8 units after supper  Oral hypoglycemic drugs the patient is taking are: Metformin 2000 mg daily, Amaryl 1 mg in the morning   A1c has been upper normal and is lower than expected at 4.9, previously 5.4 No recent fructosamine available  Current management and blood sugar patterns     He has variable readings after his evening meal and this is mostly because of variable diet  When he is eating food brought in from a restaurant or eating out his blood sugars are as high as 261  Otherwise blood sugars can be as low as 77 after supper  He was told to take his insulin in the evening before eating but he is doing it right after eating mostly and this may be occasionally causing low sugar late at night, documented at  12:63  FASTING readings are excellent and fairly consistent nearly normal  However despite reminders he does not check his sugars after breakfast and lunch  He is trying to be as active as possible but not able to do much since he cannot drive and go to his shopping areas  His weight has leveled off, previously had gained 10 pounds  As before his A1c is lower than expected   Side effects from medications have been: None  Glucose monitoring:  done less than once a day with One Touch monitor Blood Glucose readings from meter download recently:   PRE-MEAL Fasting Lunch Dinner Bedtime Overall  Glucose range:  76-121      Mean/median:  95     103   POST-MEAL PC Breakfast PC Lunch PC Dinner  Glucose range:    77-261  Mean/median:    139   Previous readings:  PRE-MEAL Fasting Lunch Dinner  late evening Overall  Glucose range:  74-149    61, 64   Mean/median:  96       POST-MEAL PC Breakfast PC Lunch PC Dinner  Glucose range:    61-261  Mean/median:   147    Glycemic control:   Lab Results  Component Value Date   HGBA1C 4.9 03/13/2018   HGBA1C 5.4 10/31/2017   HGBA1C 5.0 03/15/2017  Lab Results  Component Value Date   MICROALBUR 1.6 10/31/2017   LDLCALC 65 10/31/2017   CREATININE 1.50 10/31/2017    Self-care: The diet that the patient has been following is: tries to limit fats and sweets  He is sometimes eating cereal at breakfast. Usually eating a sandwich at lunch and meat and salad with beans at dinner.  Eating out about twice a week Usually has low-fat low sugar snacks  Supper at 4:30-5 PM     Exercise: walks in his driveway or at shopping areas  Dietician visit:  years ago.            Weight history: Highest weight in the past has been 220 and lowest 157  Wt Readings from Last 3 Encounters:  03/13/18 181 lb (82.1 kg)  10/31/17 182 lb 6.4 oz (82.7 kg)  03/15/17 172 lb (78 kg)    OTHER problems discussed in review of systems    Allergies as of  03/13/2018   No Known Allergies     Medication List        Accurate as of 03/13/18 10:05 AM. Always use your most recent med list.          aspirin 81 MG chewable tablet Chew 81 mg by mouth daily.   betamethasone dipropionate 0.05 % cream Commonly known as:  DIPROLENE Apply topically 2 (two) times daily.   furosemide 20 MG tablet Commonly known as:  LASIX TAKE 1 TABLET BY MOUTH EVERY DAY   glimepiride 1 MG tablet Commonly known as:  AMARYL TAKE 1 TABLET (1 MG TOTAL) BY MOUTH DAILY WITH BREAKFAST.   glucose blood test strip USE AS DIRECTED TO TEST TWICE DAILY   losartan 100 MG tablet Commonly known as:  COZAAR TAKE 1 TABLET BY MOUTH EVERY DAY   metFORMIN 1000 MG tablet Commonly known as:  GLUCOPHAGE Take 1 tablet (1,000 mg total) by mouth 2 (two) times daily with a meal.   NOVOLIN N RELION 100 UNIT/ML injection Generic drug:  insulin NPH Human Inject 8 Units into the skin at bedtime.   ONETOUCH DELICA LANCETS FINE Misc USED TO CHECK SUGAR 2X DAILY.   ONETOUCH DELICA LANCETS 94W Misc USED TO CHECK SUGAR 2X DAILY.   simvastatin 20 MG tablet Commonly known as:  ZOCOR TAKE 1 TABLET BY MOUTH EVERY DAY IN THE EVENING       Allergies: No Known Allergies  Past Medical History:  Diagnosis Date  . Anemia   . Arthritis    KNEES & BACK  . Cellulitis 08/2013   RT LEG  . Chronic kidney disease 08/2013   ACUTE RENAL   . Dehydration   . Diabetes mellitus without complication ([place])   . Hyperkalemia 08/2013  . Hyperlipemia   . Hypertension     Past Surgical History:  Procedure Laterality Date  . ANTERIOR CERVICAL DECOMP/DISCECTOMY FUSION N/A 10/17/2012   Procedure: ANTERIOR CERVICAL DECOMPRESSION/DISCECTOMY FUSION 2 LEVELS;  Surgeon: Charlie Pitter, MD;  Location: [facility];  Service: Neurosurgery;  Laterality: N/A;  Cervical three-four, four-five anterior cervical decompression fusion with allograft plating  . BACK SURGERY     10 years ago  . CERVICAL DISC  SURGERY      Family History  Problem Relation Age of Onset  . Cancer - Colon Mother   . Cancer - Prostate Father     Social History:  reports that he quit smoking about 20 years ago. He has never used smokeless tobacco. He reports that he  does not drink alcohol or use drugs.    Review of Systems    RENAL dysfunction: Mild and persistent Etiology unclear Microalbumin normal  Last creatinine upper normal  Lab Results  Component Value Date   CREATININE 1.50 10/31/2017   CREATININE 1.47 03/15/2017   CREATININE 1.42 12/14/2016   CREATININE 1.54 (H) 08/03/2016         Lipids: treated with Zocor 20 mg, last lipids as follows:       Lab Results  Component Value Date   CHOL 150 10/31/2017   HDL 69.30 10/31/2017   LDLCALC 65 10/31/2017   LDLDIRECT 48.9 02/21/2014   TRIG 79.0 10/31/2017   CHOLHDL 2 10/31/2017    HYPERTENSION: Has been treated with losartan 100 mg daily   He is also taking Lasix for pedal edema but he is complaining about excessive urination with this   Physical Examination:  BP 136/74   Pulse 74   Ht 5\' 8"  (1.727 m)   Wt 181 lb (82.1 kg)   SpO2 96%   BMI 27.52 kg/m   1+ ankle edema present more on the left Has mild deformities of his toes No ulcerations or calluses on the distal feet or plantar surfaces Pedal pulses normal  ASSESSMENT:   Diabetes type 2, BMI 27 See history of present illness for detailed discussion of current diabetes management, blood sugar patterns and problems identified  A1c it is lower than expected at 4.9  This is likely to be because of his chronic anemia and renal dysfunction His blood sugars are fairly good but frequently may be going up in the evening after his meal because of likely higher fat intake and eating out As before fasting readings are fairly good Fructosamine not available to assess his control otherwise  Since his fasting readings may be low normal he can reduce his metformin by 500 mg in the  evenings especially with his borderline renal function   Hypertension: His blood pressure is well controlled  He will continue losartan and follow-up with PCP also  Edema: Minimal now with 20 mg Lasix; however he does not want to take this because of excessive urination  History of foot ulcers: Has no  recurrence  RENAL dysfunction:needs follow-up creatinine level and CBC today  Anemia: No information available about work-up from PCP, will need to recheck today and check iron also    PLAN:  Take evening insulin before eating Reduce evening metformin to half a tablet Needs to discuss his anemia with PCP May take Lasix every other day to reduce excessive diuresis as long as he does not get more edema  Follow-up in 4 months  There are no Patient Instructions on file for this visit.      Elayne Snare 03/13/2018, 10:05 AM   Note: This office note was prepared with Dragon voice recognition system technology. Any transcriptional errors that result from this process are unintentional.

## 2018-03-13 NOTE — Patient Instructions (Addendum)
Reduce Metformin to 1/2 pill in pm  Check blood sugars on waking up 3 days a week  Also check blood sugars about 2 hours after meals and do this after different meals by rotation  Recommended blood sugar levels on waking up are 90-130 and about 2 hours after meal is 130-160  Please bring your blood sugar monitor to each visit, thank you  Lasix 3 days a week  Take insulin BEFORE SUPPER

## 2018-03-14 LAB — FRUCTOSAMINE: FRUCTOSAMINE: 237 umol/L (ref 0–285)

## 2018-03-15 DIAGNOSIS — R69 Illness, unspecified: Secondary | ICD-10-CM | POA: Diagnosis not present

## 2018-03-23 DIAGNOSIS — R69 Illness, unspecified: Secondary | ICD-10-CM | POA: Diagnosis not present

## 2018-04-14 ENCOUNTER — Other Ambulatory Visit: Payer: Self-pay | Admitting: Endocrinology

## 2018-04-17 DIAGNOSIS — R69 Illness, unspecified: Secondary | ICD-10-CM | POA: Diagnosis not present

## 2018-05-28 ENCOUNTER — Other Ambulatory Visit: Payer: Self-pay | Admitting: Endocrinology

## 2018-06-11 ENCOUNTER — Other Ambulatory Visit: Payer: Self-pay | Admitting: Endocrinology

## 2018-06-18 ENCOUNTER — Other Ambulatory Visit: Payer: Self-pay | Admitting: Endocrinology

## 2018-07-17 ENCOUNTER — Ambulatory Visit (INDEPENDENT_AMBULATORY_CARE_PROVIDER_SITE_OTHER): Payer: Medicare HMO | Admitting: Endocrinology

## 2018-07-17 ENCOUNTER — Other Ambulatory Visit: Payer: Self-pay

## 2018-07-17 ENCOUNTER — Encounter: Payer: Self-pay | Admitting: Endocrinology

## 2018-07-17 VITALS — BP 144/86 | HR 72 | Temp 97.6°F | Resp 14 | Ht 68.0 in | Wt 177.0 lb

## 2018-07-17 DIAGNOSIS — E1165 Type 2 diabetes mellitus with hyperglycemia: Secondary | ICD-10-CM | POA: Diagnosis not present

## 2018-07-17 DIAGNOSIS — E782 Mixed hyperlipidemia: Secondary | ICD-10-CM

## 2018-07-17 DIAGNOSIS — N182 Chronic kidney disease, stage 2 (mild): Secondary | ICD-10-CM

## 2018-07-17 DIAGNOSIS — Z794 Long term (current) use of insulin: Secondary | ICD-10-CM

## 2018-07-17 LAB — COMPREHENSIVE METABOLIC PANEL
ALT: 10 U/L (ref 0–53)
AST: 13 U/L (ref 0–37)
Albumin: 4.4 g/dL (ref 3.5–5.2)
Alkaline Phosphatase: 53 U/L (ref 39–117)
BUN: 28 mg/dL — AB (ref 6–23)
CO2: 27 mEq/L (ref 19–32)
Calcium: 10 mg/dL (ref 8.4–10.5)
Chloride: 108 mEq/L (ref 96–112)
Creatinine, Ser: 1.46 mg/dL (ref 0.40–1.50)
GFR: 46.57 mL/min — ABNORMAL LOW (ref >60.00)
Glucose, Bld: 87 mg/dL (ref 70–99)
Potassium: 4.4 mEq/L (ref 3.5–5.1)
Sodium: 143 mEq/L (ref 135–145)
Total Bilirubin: 0.5 mg/dL (ref 0.2–1.2)
Total Protein: 7 g/dL (ref 6.0–8.3)

## 2018-07-17 LAB — POCT GLYCOSYLATED HEMOGLOBIN (HGB A1C): Hemoglobin A1C: 5.3 % (ref 4.0–5.6)

## 2018-07-17 LAB — LIPID PANEL
CHOLESTEROL: 152 mg/dL (ref 0–200)
HDL: 73.5 mg/dL (ref >39.00)
LDL Cholesterol: 64 mg/dL (ref 0–99)
NonHDL: 78.26
Total CHOL/HDL Ratio: 2
Triglycerides: 73 mg/dL (ref 0.0–149.0)
VLDL: 14.6 mg/dL (ref 0.0–40.0)

## 2018-07-17 NOTE — Patient Instructions (Addendum)
No insulin in pm  Add 1/2 Glimeperide before supper  Check blood sugars on waking up days a week  Also check blood sugars about 2 hours after meals and do this after different meals by rotation  Recommended blood sugar levels on waking up are 90-130 and about 2 hours after meal is 130-160  Please bring your blood sugar monitor to each visit, thank you   Call if sugars get > 200 or low again

## 2018-07-17 NOTE — Progress Notes (Signed)
Please call to let patient know that the lab results are normal and no further action needed, he needs to still follow-up with PCP regarding his anemia

## 2018-07-17 NOTE — Progress Notes (Signed)
Patient ID: Jason Mcpherson, male   DOB: 1941-01-12, 78 y.o.   MRN: [id number]    Reason for Appointment: follow-up    History of Present Illness:          Diagnosis: Type 2 diabetes mellitus, date of diagnosis: 1997      Per Past history: The patient is unclear about his symptoms at diagnosis as well as initial treatment. Most likely has been on metformin for sometime and at some point was also given glipizide. He is also very unclear about when he was started on insulin and records from his previous endocrinologist are not available; may have been taking this over the last 15 years. Also not clear if he had been on different types of insulin However he was previously significantly overweight His A1c in 9/14 was 5.9 and in 2015 had been about 6.9-7 On his initial consultation he was taking metformin and glipizide ER; he was taking Levemir irregularly based on his blood sugar level Levemir was stopped on his initial consultation in 6/15 He was started on Januvia since 10/29/13 after stopping his Levemir insulin  His A1c in 6/17 had gone up to 7.5 and was started back on Levemir  Recent history:          INSULIN regimen = Novolin N, [employer] brand 8 units ac supper  Oral hypoglycemic drugs the patient is taking are: Metformin 2000 mg daily, Amaryl 1 mg in the morning   A1c has been relatively below normal and now 5.3 compared to 4.9  Last fructosamine was 237  Current management and blood sugar patterns     He was told on his last visit to take his evening insulin before dinner and not after and he is doing so  However he still has a tendency to low blood sugars occasionally at bedtime and during the night blood days  Again his blood sugars after evening meal are variable although not consistently high  He is not doing much physical exercise only occasionally walking at the shopping store  Leveled off with his weight gain  As before his A1c is much lower than  expected  He is also taking metformin and Amaryl as directed   Side effects from medications have been: None  Glucose monitoring:  done less than once a day with One Touch monitor Blood Glucose readings from meter download recently:   PRE-MEAL Fasting Lunch Dinner Bedtime Overall  Glucose range:  56-122    54, 58   Mean/median:      101   POST-MEAL PC Breakfast PC Lunch PC Dinner  Glucose range:    81-189  Mean/median:      Previous readings:  PRE-MEAL Fasting Lunch Dinner Bedtime Overall  Glucose range:  76-121      Mean/median:  95     103   POST-MEAL PC Breakfast PC Lunch PC Dinner  Glucose range:    77-261  Mean/median:    139       Glycemic control:   Lab Results  Component Value Date   HGBA1C 5.3 07/17/2018   HGBA1C 4.9 03/13/2018   HGBA1C 5.4 10/31/2017   Lab Results  Component Value Date   MICROALBUR 1.6 10/31/2017   LDLCALC 65 10/31/2017   CREATININE 1.52 (H) 03/13/2018    Self-care: The diet that the patient has been following is: tries to limit fats and sweets  He is sometimes eating cereal at breakfast. Usually eating a sandwich at lunch and meat  and salad with beans at dinner.  Eating out about twice a week Usually has low-fat low sugar snacks  Supper at 4:30-5 PM     Exercise:  Occasional walking at [employer] visit:  years ago.            Weight history: Highest weight in the past has been 220 and lowest 157  Wt Readings from Last 3 Encounters:  07/17/18 177 lb (80.3 kg)  03/13/18 181 lb (82.1 kg)  10/31/17 182 lb 6.4 oz (82.7 kg)    OTHER problems discussed in review of systems    Allergies as of 07/17/2018   No Known Allergies     Medication List       Accurate as of July 17, 2018  9:04 AM. Always use your most recent med list.        aspirin 81 MG chewable tablet Chew 81 mg by mouth daily.   betamethasone dipropionate 0.05 % cream Commonly known as:  DIPROLENE Apply topically 2 (two) times daily.    furosemide 20 MG tablet Commonly known as:  LASIX TAKE 1 TABLET BY MOUTH EVERY DAY   glimepiride 1 MG tablet Commonly known as:  AMARYL TAKE 1 TABLET (1 MG TOTAL) BY MOUTH DAILY WITH BREAKFAST.   glucose blood test strip Commonly known as:  ONE TOUCH ULTRA TEST USE AS DIRECTED TO TEST TWICE DAILY   losartan 100 MG tablet Commonly known as:  COZAAR TAKE 1 TABLET BY MOUTH EVERY DAY   metFORMIN 1000 MG tablet Commonly known as:  GLUCOPHAGE Take 1 tablet (1,000 mg total) by mouth 2 (two) times daily with a meal.   NovoLIN N ReliOn 100 UNIT/ML injection Generic drug:  insulin NPH Human Inject 8 Units into the skin at bedtime.   OneTouch Delica Lancets Fine Misc USED TO CHECK SUGAR 2X DAILY.   OneTouch Delica Lancets 46N Misc USED TO CHECK SUGAR 2X DAILY.   simvastatin 20 MG tablet Commonly known as:  ZOCOR TAKE 1 TABLET BY MOUTH EVERY DAY IN THE EVENING       Allergies: No Known Allergies  Past Medical History:  Diagnosis Date  . Anemia   . Arthritis    KNEES & BACK  . Cellulitis 08/2013   RT LEG  . Chronic kidney disease 08/2013   ACUTE RENAL   . Dehydration   . Diabetes mellitus without complication ([place])   . Hyperkalemia 08/2013  . Hyperlipemia   . Hypertension     Past Surgical History:  Procedure Laterality Date  . ANTERIOR CERVICAL DECOMP/DISCECTOMY FUSION N/A 10/17/2012   Procedure: ANTERIOR CERVICAL DECOMPRESSION/DISCECTOMY FUSION 2 LEVELS;  Surgeon: Charlie Pitter, MD;  Location: [facility];  Service: Neurosurgery;  Laterality: N/A;  Cervical three-four, four-five anterior cervical decompression fusion with allograft plating  . BACK SURGERY     10 years ago  . CERVICAL DISC SURGERY      Family History  Problem Relation Age of Onset  . Cancer - Colon Mother   . Cancer - Prostate Father     Social History:  reports that he quit smoking about 20 years ago. He has never used smokeless tobacco. He reports that he does not drink alcohol or use drugs.     Review of Systems    RENAL dysfunction: Mild and persistent Etiology unclear Microalbumin normal previously  Last creatinine 1.52  Lab Results  Component Value Date   CREATININE 1.52 (H) 03/13/2018   CREATININE 1.50 10/31/2017   CREATININE  1.47 03/15/2017   CREATININE 1.42 12/14/2016         Lipids: treated with Zocor 20 mg, last lipids as follows:       Lab Results  Component Value Date   CHOL 150 10/31/2017   HDL 69.30 10/31/2017   LDLCALC 65 10/31/2017   LDLDIRECT 48.9 02/21/2014   TRIG 79.0 10/31/2017   CHOLHDL 2 10/31/2017    HYPERTENSION: Has been treated with losartan 100 mg daily   He is also taking Lasix for pedal edema however because of frequent urination with this he takes this every few days   Physical Examination:  BP (!) 144/86 (BP Location: Left Arm, Patient Position: Sitting, Cuff Size: Normal)   Pulse 72   Temp 97.6 F (36.4 C)   Resp 14   Ht 5\' 8"  (1.727 m)   Wt 177 lb (80.3 kg)   SpO2 96%   BMI 26.91 kg/m   No significant peripheral edema present Has mild deformities of his distal toes No ulcerations or calluses on the distal feet or plantar surfaces Skin appears normal without pigmentation, rash or cyanosis  ASSESSMENT:   Diabetes type 2, BMI 27 See history of present illness for detailed discussion of current diabetes management, blood sugar patterns and problems identified  A1c it is lower than expected at 5.3  Will need to check fructosamine to correlate his blood sugar readings However he is tending to have occasional hypoglycemia with even 8 units of insulin and this was changed to before dinnertime on the last visit   Hypertension: His blood pressure is well controlled  He will continue losartan and follow-up with PCP as scheduled  Edema: Etiology unclear and may be related to renal dysfunction He takes Lasix sporadically and this appears to be still working   History of foot ulcers: Has no  recurrence  To  follow-up with PCP for other issues including anemia    PLAN:  Stop taking insulin He will instead take half a tablet of Amaryl at dinnertime He will call if he has any continued hypoglycemia More regular walking Balanced meals with low carbohydrate and low fat intake Follow-up in 3 months  Check labs including lipids and chemistry today  Patient Instructions  No insulin in pm  Add 1/2 Glimeperide before supper  Check blood sugars on waking up days a week  Also check blood sugars about 2 hours after meals and do this after different meals by rotation  Recommended blood sugar levels on waking up are 90-130 and about 2 hours after meal is 130-160  Please bring your blood sugar monitor to each visit, thank you           Elayne Snare 07/17/2018, 9:04 AM   Note: This office note was prepared with Dragon voice recognition system technology. Any transcriptional errors that result from this process are unintentional.

## 2018-07-18 LAB — FRUCTOSAMINE: Fructosamine: 246 umol/L (ref 0–285)

## 2018-08-09 DIAGNOSIS — R69 Illness, unspecified: Secondary | ICD-10-CM | POA: Diagnosis not present

## 2018-08-22 ENCOUNTER — Other Ambulatory Visit: Payer: Self-pay | Admitting: Endocrinology

## 2018-08-22 DIAGNOSIS — R69 Illness, unspecified: Secondary | ICD-10-CM | POA: Diagnosis not present

## 2018-08-31 ENCOUNTER — Other Ambulatory Visit: Payer: Self-pay | Admitting: Endocrinology

## 2018-09-21 ENCOUNTER — Other Ambulatory Visit: Payer: Self-pay | Admitting: Endocrinology

## 2018-09-21 DIAGNOSIS — R69 Illness, unspecified: Secondary | ICD-10-CM | POA: Diagnosis not present

## 2018-10-09 DIAGNOSIS — R69 Illness, unspecified: Secondary | ICD-10-CM | POA: Diagnosis not present

## 2018-10-23 ENCOUNTER — Other Ambulatory Visit: Payer: Self-pay

## 2018-10-23 ENCOUNTER — Ambulatory Visit (INDEPENDENT_AMBULATORY_CARE_PROVIDER_SITE_OTHER): Payer: Medicare HMO | Admitting: Endocrinology

## 2018-10-23 ENCOUNTER — Encounter: Payer: Self-pay | Admitting: Endocrinology

## 2018-10-23 VITALS — BP 130/70 | HR 64 | Temp 97.5°F | Ht 68.0 in | Wt 172.8 lb

## 2018-10-23 DIAGNOSIS — Z794 Long term (current) use of insulin: Secondary | ICD-10-CM | POA: Diagnosis not present

## 2018-10-23 DIAGNOSIS — D649 Anemia, unspecified: Secondary | ICD-10-CM | POA: Diagnosis not present

## 2018-10-23 DIAGNOSIS — E1165 Type 2 diabetes mellitus with hyperglycemia: Secondary | ICD-10-CM

## 2018-10-23 LAB — CBC WITH DIFFERENTIAL/PLATELET
Basophils Absolute: 0.1 10*3/uL (ref 0.0–0.1)
Basophils Relative: 1.2 % (ref 0.0–3.0)
Eosinophils Absolute: 0.4 10*3/uL (ref 0.0–0.7)
Eosinophils Relative: 8.1 % — ABNORMAL HIGH (ref 0.0–5.0)
HCT: 32.6 % — ABNORMAL LOW (ref 39.0–52.0)
Hemoglobin: 10.8 g/dL — ABNORMAL LOW (ref 13.0–17.0)
Lymphocytes Relative: 14.9 % (ref 12.0–46.0)
Lymphs Abs: 0.8 10*3/uL (ref 0.7–4.0)
MCHC: 33 g/dL (ref 30.0–36.0)
MCV: 96.5 fl (ref 78.0–100.0)
Monocytes Absolute: 0.6 10*3/uL (ref 0.1–1.0)
Monocytes Relative: 12.2 % — ABNORMAL HIGH (ref 3.0–12.0)
Neutro Abs: 3.4 10*3/uL (ref 1.4–7.7)
Neutrophils Relative %: 63.6 % (ref 43.0–77.0)
Platelets: 190 10*3/uL (ref 150.0–400.0)
RBC: 3.38 Mil/uL — ABNORMAL LOW (ref 4.22–5.81)
RDW: 13.8 % (ref 11.5–15.5)
WBC: 5.3 10*3/uL (ref 4.0–10.5)

## 2018-10-23 LAB — COMPREHENSIVE METABOLIC PANEL
ALT: 9 U/L (ref 0–53)
AST: 11 U/L (ref 0–37)
Albumin: 4.1 g/dL (ref 3.5–5.2)
Alkaline Phosphatase: 58 U/L (ref 39–117)
BUN: 40 mg/dL — ABNORMAL HIGH (ref 6–23)
CO2: 27 mEq/L (ref 19–32)
Calcium: 9.7 mg/dL (ref 8.4–10.5)
Chloride: 108 mEq/L (ref 96–112)
Creatinine, Ser: 1.47 mg/dL (ref 0.40–1.50)
GFR: 46.18 mL/min — ABNORMAL LOW (ref >60.00)
Glucose, Bld: 115 mg/dL — ABNORMAL HIGH (ref 70–99)
Potassium: 4.7 mEq/L (ref 3.5–5.1)
Sodium: 142 mEq/L (ref 135–145)
Total Bilirubin: 0.6 mg/dL (ref 0.2–1.2)
Total Protein: 6.4 g/dL (ref 6.0–8.3)

## 2018-10-23 LAB — MICROALBUMIN / CREATININE URINE RATIO
Creatinine,U: 66.4 mg/dL
Microalb Creat Ratio: 6.4 mg/g (ref 0.0–30.0)
Microalb, Ur: 4.2 mg/dL — ABNORMAL HIGH (ref 0.0–1.9)

## 2018-10-23 LAB — POCT GLYCOSYLATED HEMOGLOBIN (HGB A1C): Hemoglobin A1C: 5.9 % — AB (ref 4.0–5.6)

## 2018-10-23 NOTE — Patient Instructions (Addendum)
Glimeperide: 1/2 pill BEFORE SUPPER  Check blood sugars on waking up 3 days a week  Also check blood sugars about 2 hours after meals and do this after different meals by rotation  Recommended blood sugar levels on waking up are 90-130 and about 2 hours after meal is 130-160  Please bring your blood sugar monitor to each visit, thank you

## 2018-10-23 NOTE — Progress Notes (Signed)
Patient ID: Jason Mcpherson, male   DOB: 1941/05/02, 78 y.o.   MRN: [id number]    Reason for Appointment: follow-up    History of Present Illness:          Diagnosis: Type 2 diabetes mellitus, date of diagnosis: 1997      Per Past history: The patient is unclear about his symptoms at diagnosis as well as initial treatment. Most likely has been on metformin for sometime and at some point was also given glipizide. He is also very unclear about when he was started on insulin and records from his previous endocrinologist are not available; may have been taking this over the last 15 years. Also not clear if he had been on different types of insulin However he was previously significantly overweight His A1c in 9/14 was 5.9 and in 2015 had been about 6.9-7 On his initial consultation he was taking metformin and glipizide ER; he was taking Levemir irregularly based on his blood sugar level Levemir was stopped on his initial consultation in 6/15 He was started on Januvia since 10/29/13 after stopping his Levemir insulin  His A1c in 6/17 had gone up to 7.5 and was started back on Levemir  Recent history:          INSULIN: Not taking currently  Previously Novolin N, [employer] brand 8 units ac supper  Oral hypoglycemic drugs the patient is taking are: Metformin 2000 mg daily, Amaryl 0.5 mg in the evening   A1c has been relatively below normal and now 5.9  Last fructosamine was 237  Current management and blood sugar patterns     Although his A1c is relatively higher he was previously having some hypoglycemia with taking insulin with readings as low as 54  He was told on his last visit to take his Amaryl before dinner and only a half a tablet instead of in the morning  Although his blood sugars may be occasionally high this is not unusual and these are not consistently high and his average readings only about 168  No further hypoglycemia  He is not able to do much physical  activity because of his balance issues and does not like to go outside because of safety  Taking metformin consistently twice a day without side effects  However despite reminders does not check his readings after breakfast and lunch and mostly after dinner  He has lost some weight and not clear why   Side effects from medications have been: None  Glucose monitoring:  done less than once a day with One Touch monitor Blood Glucose readings from meter download recently:   PRE-MEAL Fasting Lunch Dinner Bedtime Overall  Glucose range:  110-137    103-198   Mean/median:  125    169  137   POST-MEAL ?  Breakfast PC Lunch PC Dinner  Glucose range:  114-153    Mean/median:  134     Previous readings:  PRE-MEAL Fasting Lunch Dinner Bedtime Overall  Glucose range:  56-122    54, 58   Mean/median:      101   POST-MEAL PC Breakfast PC Lunch PC Dinner  Glucose range:    81-189  Mean/median:        Glycemic control:   Lab Results  Component Value Date   HGBA1C 5.9 (A) 10/23/2018   HGBA1C 5.3 07/17/2018   HGBA1C 4.9 03/13/2018   Lab Results  Component Value Date   MICROALBUR 1.6 10/31/2017   LDLCALC 64  07/17/2018   CREATININE 1.46 07/17/2018    Self-care: The diet that the patient has been following is: tries to limit fats and sweets  He is sometimes eating cereal at breakfast. Usually eating a sandwich at lunch and meat and salad with beans at dinner.  Eating out about twice a week Usually has low-fat low sugar snacks  Supper at 4:30-5 PM     Exercise:  Occasional walking at [employer] visit:  years ago.            Weight history: Highest weight in the past has been 220 and lowest 157  Wt Readings from Last 3 Encounters:  10/23/18 172 lb 12.8 oz (78.4 kg)  07/17/18 177 lb (80.3 kg)  03/13/18 181 lb (82.1 kg)    OTHER problems discussed in review of systems    Allergies as of 10/23/2018   No Known Allergies     Medication List       Accurate as  of October 23, 2018 11:35 AM. If you have any questions, ask your nurse or doctor.        aspirin 81 MG chewable tablet Chew 81 mg by mouth daily.   betamethasone dipropionate 0.05 % cream Commonly known as: DIPROLENE Apply topically 2 (two) times daily.   furosemide 20 MG tablet Commonly known as: LASIX TAKE 1 TABLET BY MOUTH EVERY DAY   glimepiride 1 MG tablet Commonly known as: AMARYL TAKE 1 TABLET (1 MG TOTAL) BY MOUTH DAILY WITH BREAKFAST.   glucose blood test strip Commonly known as: ONE TOUCH ULTRA TEST USE AS DIRECTED TO TEST TWICE DAILY   losartan 100 MG tablet Commonly known as: COZAAR TAKE 1 TABLET BY MOUTH EVERY DAY   metFORMIN 1000 MG tablet Commonly known as: GLUCOPHAGE Take 1 tablet (1,000 mg total) by mouth 2 (two) times daily with a meal.   NovoLIN N ReliOn 100 UNIT/ML injection Generic drug: insulin NPH Human Inject 8 Units into the skin at bedtime.   OneTouch Delica Lancets Fine Misc USED TO CHECK SUGAR 2X DAILY.   OneTouch Delica Lancets 34V Misc USED TO CHECK SUGAR 2X DAILY.   simvastatin 20 MG tablet Commonly known as: ZOCOR TAKE 1 TABLET BY MOUTH EVERY DAY IN THE EVENING       Allergies: No Known Allergies  Past Medical History:  Diagnosis Date  . Anemia   . Arthritis    KNEES & BACK  . Cellulitis 08/2013   RT LEG  . Chronic kidney disease 08/2013   ACUTE RENAL   . Dehydration   . Diabetes mellitus without complication ([place])   . Hyperkalemia 08/2013  . Hyperlipemia   . Hypertension     Past Surgical History:  Procedure Laterality Date  . ANTERIOR CERVICAL DECOMP/DISCECTOMY FUSION N/A 10/17/2012   Procedure: ANTERIOR CERVICAL DECOMPRESSION/DISCECTOMY FUSION 2 LEVELS;  Surgeon: Charlie Pitter, MD;  Location: [facility];  Service: Neurosurgery;  Laterality: N/A;  Cervical three-four, four-five anterior cervical decompression fusion with allograft plating  . BACK SURGERY     10 years ago  . CERVICAL DISC SURGERY      Family  History  Problem Relation Age of Onset  . Cancer - Colon Mother   . Cancer - Prostate Father     Social History:  reports that he quit smoking about 21 years ago. He has never used smokeless tobacco. He reports that he does not drink alcohol or use drugs.    Review of Systems  RENAL dysfunction: Mild and persistent, not seeing nephrologist Etiology unclear Microalbumin normal previously  Last creatinine 1.46  Lab Results  Component Value Date   CREATININE 1.46 07/17/2018   CREATININE 1.52 (H) 03/13/2018   CREATININE 1.50 10/31/2017   CREATININE 1.47 03/15/2017         Lipids: treated with Zocor 20 mg, last lipids as follows:       Lab Results  Component Value Date   CHOL 152 07/17/2018   HDL 73.50 07/17/2018   LDLCALC 64 07/17/2018   LDLDIRECT 48.9 02/21/2014   TRIG 73.0 07/17/2018   CHOLHDL 2 07/17/2018    HYPERTENSION: Has been treated with losartan 100 mg daily   He is also taking Lasix for pedal edema however because of frequent urination with this he takes this 3 days a week   Physical Examination:  BP 130/70 (BP Location: Left Arm, Patient Position: Sitting, Cuff Size: Normal)   Pulse 64   Temp (!) 97.5 F (36.4 C) (Oral)   Ht 5\' 8"  (1.727 m)   Wt 172 lb 12.8 oz (78.4 kg)   SpO2 96%   BMI 26.27 kg/m   No peripheral edema present   No ulcerations or calluses on the toes or plantar surfaces  ASSESSMENT:   Diabetes type 2, BMI 27  Communication is somewhat difficult because of patient being hard of hearing  See history of present illness for detailed discussion of current diabetes management, blood sugar patterns and problems identified  A1c it is lower than expected at 5.9  As before is blood sugars are somewhat variable after meals but reasonably controlled considering his age and duration of diabetes With not taking any insulin as blood sugars are not getting low, previously taking only 8 units  He has been asked to take his Amaryl  before dinnertime and this was reviewed again with patient At his level of renal function his dose of metformin of 2 g daily is appropriate   Hypertension: His blood pressure is well controlled  He will continue losartan as before  History of anemia, has not had this checked recently with PCP and will check his labs and forward results  History of foot ulcers: Has no  recurrence  Edema: Controlled with low-dose Lasix    PLAN:  No change in medications including Amaryl and metformin Encouraged him to be as active as possible given the limitations Rotate monitoring of blood sugar to after breakfast and after lunch and did not need to check in the morning daily   Patient Instructions  Glimeperide: 1/2 pill BEFORE SUPPER  Check blood sugars on waking up 3 days a week  Also check blood sugars about 2 hours after meals and do this after different meals by rotation  Recommended blood sugar levels on waking up are 90-130 and about 2 hours after meal is 130-160  Please bring your blood sugar monitor to each visit, thank you         Elayne Snare 10/23/2018, 11:35 AM   Note: This office note was prepared with Dragon voice recognition system technology. Any transcriptional errors that result from this process are unintentional.

## 2018-10-23 NOTE — Progress Notes (Signed)
Please call to let patient know that the kidney liver results are normal but he still has anemia and needs to talk to his PCP, please forward to PCP

## 2018-10-31 ENCOUNTER — Ambulatory Visit: Payer: Medicare HMO | Admitting: Podiatry

## 2018-10-31 ENCOUNTER — Other Ambulatory Visit: Payer: Self-pay

## 2018-10-31 ENCOUNTER — Encounter: Payer: Self-pay | Admitting: Podiatry

## 2018-10-31 VITALS — Temp 97.7°F

## 2018-10-31 DIAGNOSIS — E1149 Type 2 diabetes mellitus with other diabetic neurological complication: Secondary | ICD-10-CM

## 2018-10-31 DIAGNOSIS — M2042 Other hammer toe(s) (acquired), left foot: Secondary | ICD-10-CM

## 2018-10-31 DIAGNOSIS — B351 Tinea unguium: Secondary | ICD-10-CM | POA: Diagnosis not present

## 2018-10-31 DIAGNOSIS — M2041 Other hammer toe(s) (acquired), right foot: Secondary | ICD-10-CM

## 2018-10-31 DIAGNOSIS — L84 Corns and callosities: Secondary | ICD-10-CM | POA: Diagnosis not present

## 2018-10-31 DIAGNOSIS — L97521 Non-pressure chronic ulcer of other part of left foot limited to breakdown of skin: Secondary | ICD-10-CM | POA: Diagnosis not present

## 2018-10-31 MED ORDER — MUPIROCIN 2 % EX OINT: 1.0000 "application " | TOPICAL_OINTMENT | Freq: Two times a day (BID) | CUTANEOUS | 2 refills | Status: DC

## 2018-10-31 NOTE — Progress Notes (Signed)
Subjective:   Patient ID: Jason Mcpherson, male   DOB: 78 y.o.   MRN: [id number]   HPI 78 year old male presents the office with concerns of his left big toenail becoming thickened discolored he states that a part of it did fall off.  He denies any redness or drainage or any pain.  Also his nails are noncommunicative but he states are all thickened discolored.  He also has a small skin lesion on the dorsal aspect left third toe. He is unsure how it started. No drainage or pus. He also has a callus on the tip of the right 3rd toe but no opening. He also states that his left big toe "catches" when he walks. He does wear diabetic shoes but they are several years old from 2016. He has not changed shoes since. He is diabetic and last A1c was 5.9 on 10/23/2018.    Review of Systems  All other systems reviewed and are negative.  Past Medical History:  Diagnosis Date  . Anemia   . Arthritis    KNEES & BACK  . Cellulitis 08/2013   RT LEG  . Chronic kidney disease 08/2013   ACUTE RENAL   . Dehydration   . Diabetes mellitus without complication ([place])   . Hyperkalemia 08/2013  . Hyperlipemia   . Hypertension     Past Surgical History:  Procedure Laterality Date  . ANTERIOR CERVICAL DECOMP/DISCECTOMY FUSION N/A 10/17/2012   Procedure: ANTERIOR CERVICAL DECOMPRESSION/DISCECTOMY FUSION 2 LEVELS;  Surgeon: Charlie Pitter, MD;  Location: [facility];  Service: Neurosurgery;  Laterality: N/A;  Cervical three-four, four-five anterior cervical decompression fusion with allograft plating  . BACK SURGERY     10 years ago  . CERVICAL DISC SURGERY       Current Outpatient Medications:  .  aspirin 81 MG chewable tablet, Chew 81 mg by mouth daily., Disp: , Rfl:  .  betamethasone dipropionate (DIPROLENE) 0.05 % cream, Apply topically 2 (two) times daily., Disp: , Rfl:  .  furosemide (LASIX) 20 MG tablet, TAKE 1 TABLET BY MOUTH EVERY DAY, Disp: 90 tablet, Rfl: 1 .  glimepiride (AMARYL) 1 MG tablet, TAKE 1  TABLET (1 MG TOTAL) BY MOUTH DAILY WITH BREAKFAST., Disp: 90 tablet, Rfl: 2 .  glucose blood (ONE TOUCH ULTRA TEST) test strip, USE AS DIRECTED TO TEST TWICE DAILY, Disp: 100 each, Rfl: 1 .  losartan (COZAAR) 100 MG tablet, TAKE 1 TABLET BY MOUTH EVERY DAY, Disp: 90 tablet, Rfl: 1 .  metFORMIN (GLUCOPHAGE) 1000 MG tablet, Take 1 tablet (1,000 mg total) by mouth 2 (two) times daily with a meal., Disp: 60 tablet, Rfl: 3 .  mupirocin ointment (BACTROBAN) 2 %, Apply 1 application topically 2 (two) times daily., Disp: 30 g, Rfl: 2 .  OneTouch Delica Lancets 81W MISC, USED TO CHECK SUGAR 2X DAILY., Disp: 100 each, Rfl: 1 .  ONETOUCH DELICA LANCETS FINE MISC, USED TO CHECK SUGAR 2X DAILY., Disp: 100 each, Rfl: 3 .  simvastatin (ZOCOR) 20 MG tablet, TAKE 1 TABLET BY MOUTH EVERY DAY IN THE EVENING, Disp: 90 tablet, Rfl: 0  No Known Allergies        Objective:  Physical Exam  General: AAO x3, NAD  Dermatological: The left hallux toenail significantly hypertrophic, dystrophic with yellow-brown discoloration.  All the nails are dystrophic and discolored as well the left hallux is the most significant.  There is no edema, erythema, drainage or pus or any signs of infection.  There is a  small superficial abrasion type lesion on the dorsal aspect the left third toe without any ascending cellulitis.  No fluctuation crepitation.  There is no malodor.  Hyperkeratotic lesions to the right third toe without any underlying ulceration drainage or signs of infection.  Vascular: Dorsalis Pedis artery and Posterior Tibial artery pedal pulses are 2/4 bilateral with immedate capillary fill time.  There is no pain with calf compression, swelling, warmth, erythema.   Neruologic: Sensation decreased with Semmes-Weinstein monofilament.  Musculoskeletal: There is no contractures are present.  Gait: Unassisted, Nonantalgic.      Assessment:   Onychodystrophy, onychomycosis; skin lesion left third toe; pre-ulcerative  callus     Plan:  -Treatment options discussed including all alternatives, risks, and complications -Etiology of symptoms were discussed -Prescribed mupirocin ointment and recommended daily dressing changes to the left third toe daily.  If not healed over the next 2 weeks or any worsening issues are to arise.  Monitoring signs or symptoms of infection.  Dispensed offloading pads -Debrided the hyperkeratotic tissue that any complications or bleeding to the right third toe. -Debrided the nails x10 without any complications or bleeding. -Regards to his left big toe "catching" I do think you benefit from new shoes as his shoes are several years old.  They will visit with a precertification diabetic shoes, have him follow-up with Liliane Channel for measurement.  Trula Slade DPM

## 2018-11-09 ENCOUNTER — Other Ambulatory Visit: Payer: Self-pay | Admitting: Endocrinology

## 2018-11-14 ENCOUNTER — Other Ambulatory Visit: Payer: Self-pay

## 2018-11-14 ENCOUNTER — Ambulatory Visit: Payer: Medicare HMO | Admitting: Orthotics

## 2018-11-14 DIAGNOSIS — M2042 Other hammer toe(s) (acquired), left foot: Secondary | ICD-10-CM

## 2018-11-14 DIAGNOSIS — E1149 Type 2 diabetes mellitus with other diabetic neurological complication: Secondary | ICD-10-CM

## 2018-11-14 DIAGNOSIS — L84 Corns and callosities: Secondary | ICD-10-CM

## 2018-11-14 DIAGNOSIS — M2041 Other hammer toe(s) (acquired), right foot: Secondary | ICD-10-CM

## 2018-11-14 NOTE — Progress Notes (Signed)

## 2018-11-23 ENCOUNTER — Other Ambulatory Visit: Payer: Self-pay | Admitting: Endocrinology

## 2018-11-29 ENCOUNTER — Other Ambulatory Visit: Payer: Self-pay | Admitting: Endocrinology

## 2018-11-29 DIAGNOSIS — R69 Illness, unspecified: Secondary | ICD-10-CM | POA: Diagnosis not present

## 2018-12-11 ENCOUNTER — Ambulatory Visit (INDEPENDENT_AMBULATORY_CARE_PROVIDER_SITE_OTHER): Payer: Medicare HMO | Admitting: Orthotics

## 2018-12-11 ENCOUNTER — Other Ambulatory Visit: Payer: Self-pay

## 2018-12-11 DIAGNOSIS — M2041 Other hammer toe(s) (acquired), right foot: Secondary | ICD-10-CM | POA: Diagnosis not present

## 2018-12-11 DIAGNOSIS — E1149 Type 2 diabetes mellitus with other diabetic neurological complication: Secondary | ICD-10-CM | POA: Diagnosis not present

## 2018-12-11 DIAGNOSIS — L84 Corns and callosities: Secondary | ICD-10-CM | POA: Diagnosis not present

## 2018-12-11 DIAGNOSIS — L97521 Non-pressure chronic ulcer of other part of left foot limited to breakdown of skin: Secondary | ICD-10-CM

## 2018-12-11 DIAGNOSIS — M2042 Other hammer toe(s) (acquired), left foot: Secondary | ICD-10-CM | POA: Diagnosis not present

## 2018-12-11 NOTE — Progress Notes (Signed)

## 2018-12-16 ENCOUNTER — Other Ambulatory Visit: Payer: Self-pay | Admitting: Endocrinology

## 2018-12-26 DIAGNOSIS — Z1389 Encounter for screening for other disorder: Secondary | ICD-10-CM | POA: Diagnosis not present

## 2018-12-26 DIAGNOSIS — Z0000 Encounter for general adult medical examination without abnormal findings: Secondary | ICD-10-CM | POA: Diagnosis not present

## 2018-12-26 DIAGNOSIS — N183 Chronic kidney disease, stage 3 (moderate): Secondary | ICD-10-CM | POA: Diagnosis not present

## 2018-12-26 DIAGNOSIS — E1140 Type 2 diabetes mellitus with diabetic neuropathy, unspecified: Secondary | ICD-10-CM | POA: Diagnosis not present

## 2018-12-26 DIAGNOSIS — E1122 Type 2 diabetes mellitus with diabetic chronic kidney disease: Secondary | ICD-10-CM | POA: Diagnosis not present

## 2018-12-26 DIAGNOSIS — E7800 Pure hypercholesterolemia, unspecified: Secondary | ICD-10-CM | POA: Diagnosis not present

## 2018-12-26 DIAGNOSIS — I10 Essential (primary) hypertension: Secondary | ICD-10-CM | POA: Diagnosis not present

## 2018-12-29 ENCOUNTER — Other Ambulatory Visit: Payer: Self-pay | Admitting: Endocrinology

## 2018-12-29 DIAGNOSIS — R69 Illness, unspecified: Secondary | ICD-10-CM | POA: Diagnosis not present

## 2019-01-09 DIAGNOSIS — N182 Chronic kidney disease, stage 2 (mild): Secondary | ICD-10-CM | POA: Diagnosis not present

## 2019-01-09 DIAGNOSIS — E11621 Type 2 diabetes mellitus with foot ulcer: Secondary | ICD-10-CM | POA: Diagnosis not present

## 2019-01-09 DIAGNOSIS — Z794 Long term (current) use of insulin: Secondary | ICD-10-CM | POA: Diagnosis not present

## 2019-01-09 DIAGNOSIS — R600 Localized edema: Secondary | ICD-10-CM | POA: Diagnosis not present

## 2019-01-16 DIAGNOSIS — R69 Illness, unspecified: Secondary | ICD-10-CM | POA: Diagnosis not present

## 2019-01-18 ENCOUNTER — Other Ambulatory Visit: Payer: Self-pay

## 2019-01-22 ENCOUNTER — Encounter: Payer: Self-pay | Admitting: Endocrinology

## 2019-01-22 ENCOUNTER — Ambulatory Visit (INDEPENDENT_AMBULATORY_CARE_PROVIDER_SITE_OTHER): Payer: Medicare HMO | Admitting: Endocrinology

## 2019-01-22 ENCOUNTER — Other Ambulatory Visit: Payer: Self-pay

## 2019-01-22 VITALS — BP 140/80 | HR 91 | Ht 68.0 in | Wt 172.0 lb

## 2019-01-22 DIAGNOSIS — E1165 Type 2 diabetes mellitus with hyperglycemia: Secondary | ICD-10-CM | POA: Diagnosis not present

## 2019-01-22 DIAGNOSIS — Z23 Encounter for immunization: Secondary | ICD-10-CM | POA: Diagnosis not present

## 2019-01-22 DIAGNOSIS — Z794 Long term (current) use of insulin: Secondary | ICD-10-CM

## 2019-01-22 DIAGNOSIS — E1142 Type 2 diabetes mellitus with diabetic polyneuropathy: Secondary | ICD-10-CM

## 2019-01-22 LAB — COMPREHENSIVE METABOLIC PANEL
ALT: 9 U/L (ref 0–53)
AST: 12 U/L (ref 0–37)
Albumin: 4.2 g/dL (ref 3.5–5.2)
Alkaline Phosphatase: 69 U/L (ref 39–117)
BUN: 41 mg/dL — ABNORMAL HIGH (ref 6–23)
CO2: 29 mEq/L (ref 19–32)
Calcium: 10 mg/dL (ref 8.4–10.5)
Chloride: 107 mEq/L (ref 96–112)
Creatinine, Ser: 1.71 mg/dL — ABNORMAL HIGH (ref 0.40–1.50)
GFR: 38.76 mL/min — ABNORMAL LOW (ref >60.00)
Glucose, Bld: 240 mg/dL — ABNORMAL HIGH (ref 70–99)
Potassium: 4.3 mEq/L (ref 3.5–5.1)
Sodium: 143 mEq/L (ref 135–145)
Total Bilirubin: 0.5 mg/dL (ref 0.2–1.2)
Total Protein: 6.7 g/dL (ref 6.0–8.3)

## 2019-01-22 LAB — LIPID PANEL
Cholesterol: 139 mg/dL (ref 0–200)
HDL: 68.5 mg/dL (ref >39.00)
LDL Cholesterol: 57 mg/dL (ref 0–99)
NonHDL: 70.2
Total CHOL/HDL Ratio: 2
Triglycerides: 67 mg/dL (ref 0.0–149.0)
VLDL: 13.4 mg/dL (ref 0.0–40.0)

## 2019-01-22 LAB — POCT GLYCOSYLATED HEMOGLOBIN (HGB A1C): Hemoglobin A1C: 6.5 % — AB (ref 4.0–5.6)

## 2019-01-22 LAB — GLUCOSE, POCT (MANUAL RESULT ENTRY): POC Glucose: 257 mg/dl — AB (ref 70–99)

## 2019-01-22 MED ORDER — REPAGLINIDE 0.5 MG PO TABS: 0.5000 mg | ORAL_TABLET | Freq: Two times a day (BID) | ORAL | 2 refills | Status: DC

## 2019-01-22 NOTE — Progress Notes (Signed)
Patient ID: Jason Mcpherson, male   DOB: Oct 06, 1940, 78 y.o.   MRN: [id number]    Reason for Appointment: follow-up    History of Present Illness:          Diagnosis: Type 2 diabetes mellitus, date of diagnosis: 1997       Past history: The patient is unclear about his symptoms at diagnosis as well as initial treatment. Most likely has been on metformin for sometime and at some point was also given glipizide. He is also very unclear about when he was started on insulin and records from his previous endocrinologist are not available; may have been taking this over the last 15 years. Also not clear if he had been on different types of insulin However he was previously significantly overweight His A1c in 9/14 was 5.9 and in 2015 had been about 6.9-7 On his initial consultation he was taking metformin and glipizide ER; he was taking Levemir irregularly based on his blood sugar level Levemir was stopped on his initial consultation in 6/15 He was started on Januvia since 10/29/13 after stopping his Levemir insulin  His A1c in 6/17 had gone up to 7.5 and was started back on Levemir  Recent history:          INSULIN: As needed  Previously Novolin N, [employer] brand 8 units ac supper  Oral hypoglycemic drugs the patient is taking are: Metformin 2000 mg daily    A1c has been relatively normal and now higher at 6.5 compared to 5.9  Last fructosamine was 237  Current management and blood sugar patterns     He had been taking only 0.5 mg Amaryl on the last visit but he was told to stop this over a month ago by his PCP possibly because of hypoglycemia but this is unclear  Difficult to get a good history from him today because of hearing difficulties  However he says that if his blood sugar is well over 200 at night he may take his NPH insulin  He is giving conflicting responses to questions today  This may have caused his blood sugar of 61 at 2 AM last month  He is now  however trying to do a lot of walking at times up to 30 minutes  His weight is the same  Sometimes may have higher readings based on his type of meal, last night had [place] fries, macaroni and pork chop for dinner  However more recently postprandial readings are generally higher  Also today postprandial reading after breakfast was 257 although baseline was 190 this morning  FASTING blood sugars are significantly higher compared to last visit when they were averaging only 125; last blood possibly when he was taking glimepiride he was having better readings   Side effects from medications have been: None  Glucose monitoring:  done less than once a day with One Touch monitor Blood Glucose readings from meter download for the last 4 weeks:   PRE-MEAL Fasting Lunch Dinner Bedtime Overall  Glucose range:  118-190     61-251  Mean/median:  158    165   POST-MEAL PC Breakfast PC Lunch PC Dinner  Glucose range:    127-251  Mean/median:    176    Previous readings:  PRE-MEAL Fasting Lunch Dinner Bedtime Overall  Glucose range:  110-137    103-198   Mean/median:  125    169  137   POST-MEAL ?  Breakfast PC Lunch PC Dinner  Glucose range:  114-153    Mean/median:  134          Glycemic control:   Lab Results  Component Value Date   HGBA1C 6.5 (A) 01/22/2019   HGBA1C 5.9 (A) 10/23/2018   HGBA1C 5.3 07/17/2018   Lab Results  Component Value Date   MICROALBUR 4.2 (H) 10/23/2018   LDLCALC 64 07/17/2018   CREATININE 1.47 10/23/2018    Self-care: The diet that the patient has been following is: tries to limit fats and sweets  He is sometimes eating cereal at breakfast. Usually eating a sandwich at lunch and meat and salad with beans at dinner.  Eating out about twice a week Usually has low-fat low sugar snacks  Supper at 4:30-5 PM     Exercise:  Occasional walking at [employer] visit:  years ago.            Weight history: Highest weight in the past has been  220 and lowest 157  Wt Readings from Last 3 Encounters:  01/22/19 172 lb (78 kg)  10/23/18 172 lb 12.8 oz (78.4 kg)  07/17/18 177 lb (80.3 kg)    OTHER problems discussed in review of systems    Allergies as of 01/22/2019   No Known Allergies     Medication List       Accurate as of January 22, 2019 12:08 PM. If you have any questions, ask your nurse or doctor.        STOP taking these medications   glimepiride 1 MG tablet Commonly known as: AMARYL Stopped by: Elayne Snare, MD     TAKE these medications   aspirin 81 MG chewable tablet Chew 81 mg by mouth daily.   betamethasone dipropionate 0.05 % cream Commonly known as: DIPROLENE Apply topically 2 (two) times daily.   furosemide 20 MG tablet Commonly known as: LASIX TAKE 1 TABLET BY MOUTH EVERY DAY   losartan 100 MG tablet Commonly known as: COZAAR TAKE 1 TABLET BY MOUTH EVERY DAY   metFORMIN 1000 MG tablet Commonly known as: GLUCOPHAGE Take 1 tablet (1,000 mg total) by mouth 2 (two) times daily with a meal.   mupirocin ointment 2 % Commonly known as: BACTROBAN Apply 1 application topically 2 (two) times daily.   OneTouch Delica Lancets Fine Misc USED TO CHECK SUGAR 2X DAILY.   OneTouch Delica Lancets 99991111 Misc USED TO CHECK SUGAR 2X DAILY.   OneTouch Ultra test strip Generic drug: glucose blood USE AS DIRECTED TO TEST TWICE DAILY   repaglinide 0.5 MG tablet Commonly known as: Prandin Take 1 tablet (0.5 mg total) by mouth 2 (two) times daily before a meal. Started by: Elayne Snare, MD   simvastatin 20 MG tablet Commonly known as: ZOCOR TAKE 1 TABLET BY MOUTH EVERY DAY IN THE EVENING       Allergies: No Known Allergies  Past Medical History:  Diagnosis Date  . Anemia   . Arthritis    KNEES & BACK  . Cellulitis 08/2013   RT LEG  . Chronic kidney disease 08/2013   ACUTE RENAL   . Dehydration   . Diabetes mellitus without complication ([place])   . Hyperkalemia 08/2013  . Hyperlipemia   .  Hypertension     Past Surgical History:  Procedure Laterality Date  . ANTERIOR CERVICAL DECOMP/DISCECTOMY FUSION N/A 10/17/2012   Procedure: ANTERIOR CERVICAL DECOMPRESSION/DISCECTOMY FUSION 2 LEVELS;  Surgeon: Charlie Pitter, MD;  Location: [facility];  Service: Neurosurgery;  Laterality: N/A;  Cervical three-four, four-five anterior cervical decompression fusion with allograft plating  . BACK SURGERY     10 years ago  . CERVICAL DISC SURGERY      Family History  Problem Relation Age of Onset  . Cancer - Colon Mother   . Cancer - Prostate Father     Social History:  reports that he quit smoking about 21 years ago. He has never used smokeless tobacco. He reports that he does not drink alcohol or use drugs.    Review of Systems    RENAL dysfunction: Mild and persistent, not seeing nephrologist Etiology unclear Microalbumin normal previously  Last creatinines:  Lab Results  Component Value Date   CREATININE 1.47 10/23/2018   CREATININE 1.46 07/17/2018   CREATININE 1.52 (H) 03/13/2018   CREATININE 1.50 10/31/2017         Lipids: treated with Zocor 20 mg, last lipids as follows:       Lab Results  Component Value Date   CHOL 152 07/17/2018   HDL 73.50 07/17/2018   LDLCALC 64 07/17/2018   LDLDIRECT 48.9 02/21/2014   TRIG 73.0 07/17/2018   CHOLHDL 2 07/17/2018    HYPERTENSION: Has been treated with losartan 100 mg daily by his PCP   He is taking Lasix for pedal edema however because of frequent urination with this he may not take this every day  He has been asked to see his PCP regarding chronic anemia   Physical Examination:  BP 140/80 (BP Location: Left Arm, Patient Position: Sitting, Cuff Size: Normal)   Pulse 91   Ht 5\' 8"  (1.727 m)   Wt 172 lb (78 kg)   SpO2 96%   BMI 26.15 kg/m   No pedal edema present   No ulcerations or calluses on the toes or plantar surfaces, Mild peripheral cyanosis present on keeping the feet dependent  ASSESSMENT:    Diabetes type 2, BMI 26  Communication is somewhat difficult because of patient being hard of hearing  See history of present illness for detailed discussion of current diabetes management, blood sugar patterns and problems identified  A1c is relatively higher at 6.5  Apparently his PCP stopped his glimepiride possibly earlier this month As above his history is difficult to ascertain because of his hearing difficulties No family member present today Although he has been taking Amaryl for quite some time and the dose was reduced to 0.5 mg he may have had some hypoglycemia However he also says that he may take his 8 units of insulin sometimes when his blood sugars over 200 at night Discussed that this is inappropriate as this is a long-acting insulin  Also on metformin maximum dose   Hypertension: His blood pressure is well controlled  He will continue losartan unchanged  History of anemia, follow-up with PCP, discussed need for evaluation and treatment for this  History of foot ulcers: Has normal exam on inspection today  Edema: Controlled with low-dose Lasix and he can take this as needed    PLAN:  Since he primarily has postprandial hyperglycemia he will be given a trial of Prandin instead of Amaryl This will be taken up to 30 minutes before the meal and given him patient handout on this medication He will take this before breakfast and dinner Also instructed to check blood sugars after breakfast more often than before breakfast Instructed not to take any insulin Check renal function and if stable will continue same dose of metformin  Recheck lipids, last evaluation was in  3/20  Total visit time for evaluation and management of multiple problems and counseling =25 minutes  High dose influenza vaccine given and given patient information on on this  There are no Patient Instructions on file for this visit.      Elayne Snare 01/22/2019, 12:08 PM   Note: This office note  was prepared with Dragon voice recognition system technology. Any transcriptional errors that result from this process are unintentional.

## 2019-01-24 ENCOUNTER — Other Ambulatory Visit: Payer: Self-pay

## 2019-01-24 DIAGNOSIS — H903 Sensorineural hearing loss, bilateral: Secondary | ICD-10-CM | POA: Diagnosis not present

## 2019-01-29 ENCOUNTER — Other Ambulatory Visit: Payer: Self-pay

## 2019-01-29 ENCOUNTER — Ambulatory Visit: Payer: Medicare HMO | Admitting: Podiatry

## 2019-01-29 ENCOUNTER — Encounter: Payer: Self-pay | Admitting: Podiatry

## 2019-01-29 DIAGNOSIS — E119 Type 2 diabetes mellitus without complications: Secondary | ICD-10-CM

## 2019-01-29 DIAGNOSIS — B351 Tinea unguium: Secondary | ICD-10-CM | POA: Diagnosis not present

## 2019-01-29 DIAGNOSIS — M79674 Pain in right toe(s): Secondary | ICD-10-CM | POA: Diagnosis not present

## 2019-01-29 DIAGNOSIS — M79675 Pain in left toe(s): Secondary | ICD-10-CM

## 2019-01-29 DIAGNOSIS — L84 Corns and callosities: Secondary | ICD-10-CM

## 2019-01-29 NOTE — Progress Notes (Signed)
Subjective: Jason Mcpherson is seen today for follow up painful, elongated, thickened toenails 1-5 b/l feet.  Patient states it is difficult for him to cut his b/l great toes. He did attempt to cut his lesser toenails.   Current Outpatient Medications on File Prior to Visit  Medication Sig  . aspirin 81 MG chewable tablet Chew 81 mg by mouth daily.  . betamethasone dipropionate (DIPROLENE) 0.05 % cream Apply topically 2 (two) times daily.  . furosemide (LASIX) 20 MG tablet TAKE 1 TABLET BY MOUTH EVERY DAY  . losartan (COZAAR) 100 MG tablet TAKE 1 TABLET BY MOUTH EVERY DAY  . metFORMIN (GLUCOPHAGE) 1000 MG tablet Take 1 tablet (1,000 mg total) by mouth 2 (two) times daily with a meal. (Patient taking differently: Take 500 mg by mouth 2 (two) times daily with a meal. Take 1/2 tablet (500mg  total) by mouth twice daily.)  . mupirocin ointment (BACTROBAN) 2 % Apply 1 application topically 2 (two) times daily.  Glory Rosebush Delica Lancets 99991111 MISC USED TO CHECK SUGAR 2X DAILY.  Marland Kitchen ONETOUCH DELICA LANCETS FINE MISC USED TO CHECK SUGAR 2X DAILY.  Marland Kitchen ONETOUCH ULTRA test strip USE AS DIRECTED TO TEST TWICE DAILY  . repaglinide (PRANDIN) 0.5 MG tablet Take 1 tablet (0.5 mg total) by mouth 2 (two) times daily before a meal.  . simvastatin (ZOCOR) 20 MG tablet TAKE 1 TABLET BY MOUTH EVERY DAY IN THE EVENING   No current facility-administered medications on file prior to visit.      No Known Allergies   Objective:  Vascular Examination: Capillary refill time immediate x 10 digits.  Dorsalis pedis present b/l.  Posterior tibial pulses present b/l.  Digital hair  present x 10 digits.  Skin temperature gradient WNL b/l.   Dermatological Examination: Skin with normal turgor, texture and tone b/l.  Toenails 1-5 b/l discolored, thick, dystrophic with subungual debris and pain with palpation to nailbeds due to thickness of nails.    Lesser toenails with evidence of aggressive trimming, especially left  3rd digit. Left 3rd digit nailplate noted to be incurvated. Digit suspicious for gouty arthritis. There was small amount of gouty tophi noted in lateral nailfold.  Hyperkeratotic lesion distal tip right 2nd digit. No erythema, no edema, no drainage, no flocculence noted.  Musculoskeletal: Muscle strength 5/5 to all LE muscle groups.  Hammertoes lesser digits b/l.  No pain, crepitus or joint limitation noted with ROM.   Neurological Examination: Protective sensation intact with 10 gram monofilament bilaterally.  Epicritic sensation present bilaterally.  Vibratory sensation intact bilaterally.   Assessment: Painful onychomycosis toenails 1-5 b/l  Ingrown toenail left 3rd digit, noninfected Corn distal tip right 2nd digit NIDDM  Plan: 1. Toenails 1-5 b/l were debrided in length and girth. Offending nail borders debrided and curretaged left 3rd digit. Light bleeding addressed with Lumicain Hemostatic Solution. Borders cleansed with alcohol. Antibiotic ointment applied. He has prescription for Mupirocin Ointment and was instructed to place on left 3rd toe once daily for one week. 2. Corn(s) right 2nd digit pared utilizing sterile scalpel blade without incident. Dispensed digital toe cap for daily protection. 3. Patient to continue soft, supportive shoe gear daily. 4. Patient to report any pedal injuries to medical professional immediately. 5. Patient requests to be seen after the New Year.  6. Patient/POA to call should there be a concern in the interim.

## 2019-01-29 NOTE — Patient Instructions (Addendum)
APPLY ANTIBIOTIC OINTMENT TO LEFT THIRD TOE EVERY DAY FOR ONE WEEK WEAR TOE CAP EVERY DAY  Diabetes Mellitus and Foot Care Foot care is an important part of your health, especially when you have diabetes. Diabetes may cause you to have problems because of poor blood flow (circulation) to your feet and legs, which can cause your skin to:  Become thinner and drier.  Break more easily.  Heal more slowly.  Peel and crack. You may also have nerve damage (neuropathy) in your legs and feet, causing decreased feeling in them. This means that you may not notice minor injuries to your feet that could lead to more serious problems. Noticing and addressing any potential problems early is the best way to prevent future foot problems. How to care for your feet Foot hygiene  Wash your feet daily with warm water and mild soap. Do not use hot water. Then, pat your feet and the areas between your toes until they are completely dry. Do not soak your feet as this can dry your skin.  Trim your toenails straight across. Do not dig under them or around the cuticle. File the edges of your nails with an emery board or nail file.  Apply a moisturizing lotion or petroleum jelly to the skin on your feet and to dry, brittle toenails. Use lotion that does not contain alcohol and is unscented. Do not apply lotion between your toes. Shoes and socks  Wear clean socks or stockings every day. Make sure they are not too tight. Do not wear knee-high stockings since they may decrease blood flow to your legs.  Wear shoes that fit properly and have enough cushioning. Always look in your shoes before you put them on to be sure there are no objects inside.  To break in new shoes, wear them for just a few hours a day. This prevents injuries on your feet. Wounds, scrapes, corns, and calluses  Check your feet daily for blisters, cuts, bruises, sores, and redness. If you cannot see the bottom of your feet, use a mirror or ask  someone for help.  Do not cut corns or calluses or try to remove them with medicine.  If you find a minor scrape, cut, or break in the skin on your feet, keep it and the skin around it clean and dry. You may clean these areas with mild soap and water. Do not clean the area with peroxide, alcohol, or iodine.  If you have a wound, scrape, corn, or callus on your foot, look at it several times a day to make sure it is healing and not infected. Check for: ? Redness, swelling, or pain. ? Fluid or blood. ? Warmth. ? Pus or a bad smell. General instructions  Do not cross your legs. This may decrease blood flow to your feet.  Do not use heating pads or hot water bottles on your feet. They may burn your skin. If you have lost feeling in your feet or legs, you may not know this is happening until it is too late.  Protect your feet from hot and cold by wearing shoes, such as at the beach or on hot pavement.  Schedule a complete foot exam at least once a year (annually) or more often if you have foot problems. If you have foot problems, report any cuts, sores, or bruises to your health care provider immediately. Contact a health care provider if:  You have a medical condition that increases your risk of infection and  you have any cuts, sores, or bruises on your feet.  You have an injury that is not healing.  You have redness on your legs or feet.  You feel burning or tingling in your legs or feet.  You have pain or cramps in your legs and feet.  Your legs or feet are numb.  Your feet always feel cold.  You have pain around a toenail. Get help right away if:  You have a wound, scrape, corn, or callus on your foot and: ? You have pain, swelling, or redness that gets worse. ? You have fluid or blood coming from the wound, scrape, corn, or callus. ? Your wound, scrape, corn, or callus feels warm to the touch. ? You have pus or a bad smell coming from the wound, scrape, corn, or callus. ?  You have a fever. ? You have a red line going up your leg. Summary  Check your feet every day for cuts, sores, red spots, swelling, and blisters.  Moisturize feet and legs daily.  Wear shoes that fit properly and have enough cushioning.  If you have foot problems, report any cuts, sores, or bruises to your health care provider immediately.  Schedule a complete foot exam at least once a year (annually) or more often if you have foot problems. This information is not intended to replace advice given to you by your health care provider. Make sure you discuss any questions you have with your health care provider. Document Released: 04/16/2000 Document Revised: 06/01/2017 Document Reviewed: 05/21/2016 Elsevier Patient Education  [place]? An infection that lies within the keratin of your nail plate that is caused by a fungus.  WHY ME? Fungal infections affect all ages, sexes, races, and creeds.  There may be many factors that predispose you to a fungal infection such as age, coexisting medical conditions such as diabetes, or an autoimmune disease; stress, medications, fatigue, genetics, etc.  Bottom line: fungus thrives in a warm, moist environment and your shoes offer such a location.  IS IT CONTAGIOUS? Theoretically, yes.  You do not want to share shoes, nail clippers or files with someone who has fungal toenails.  Walking around barefoot in the same room or sleeping in the same bed is unlikely to transfer the organism.  It is important to realize, however, that fungus can spread easily from one nail to the next on the same foot.  HOW DO WE TREAT THIS?  There are several ways to treat this condition.  Treatment may depend on many factors such as age, medications, pregnancy, liver and kidney conditions, etc.  It is best to ask your doctor which options are available to you.  1. No treatment.   Unlike many other medical concerns, you can  live with this condition.  However for many people this can be a painful condition and may lead to ingrown toenails or a bacterial infection.  It is recommended that you keep the nails cut short to help reduce the amount of fungal nail. 2. Topical treatment.  These range from herbal remedies to prescription strength nail lacquers.  About 40-50% effective, topicals require twice daily application for approximately 9 to 12 months or until an entirely new nail has grown out.  The most effective topicals are medical grade medications available through physicians offices. 3. Oral antifungal medications.  With an 80-90% cure rate, the most common oral medication requires 3 to 4 months of therapy and stays  in your system for a year as the new nail grows out.  Oral antifungal medications do require blood work to make sure it is a safe drug for you.  A liver function panel will be performed prior to starting the medication and after the first month of treatment.  It is important to have the blood work performed to avoid any harmful side effects.  In general, this medication safe but blood work is required. 4. Laser Therapy.  This treatment is performed by applying a specialized laser to the affected nail plate.  This therapy is noninvasive, fast, and non-painful.  It is not covered by insurance and is therefore, out of pocket.  The results have been very good with a 80-95% cure rate.  [facility] is the only practice in the area to offer this therapy. 5. Permanent Nail Avulsion.  Removing the entire nail so that a new nail will not grow back. 

## 2019-01-30 ENCOUNTER — Other Ambulatory Visit: Payer: Self-pay | Admitting: Endocrinology

## 2019-02-23 DIAGNOSIS — R69 Illness, unspecified: Secondary | ICD-10-CM | POA: Diagnosis not present

## 2019-02-27 DIAGNOSIS — R69 Illness, unspecified: Secondary | ICD-10-CM | POA: Diagnosis not present

## 2019-03-15 ENCOUNTER — Other Ambulatory Visit: Payer: Self-pay

## 2019-03-19 ENCOUNTER — Encounter: Payer: Self-pay | Admitting: Endocrinology

## 2019-03-19 ENCOUNTER — Ambulatory Visit (INDEPENDENT_AMBULATORY_CARE_PROVIDER_SITE_OTHER): Payer: Medicare HMO | Admitting: Endocrinology

## 2019-03-19 VITALS — BP 150/62 | HR 83 | Ht 68.0 in | Wt 174.8 lb

## 2019-03-19 DIAGNOSIS — N183 Chronic kidney disease, stage 3 unspecified: Secondary | ICD-10-CM | POA: Diagnosis not present

## 2019-03-19 DIAGNOSIS — E1142 Type 2 diabetes mellitus with diabetic polyneuropathy: Secondary | ICD-10-CM

## 2019-03-19 LAB — BASIC METABOLIC PANEL
BUN: 33 mg/dL — ABNORMAL HIGH (ref 6–23)
CO2: 26 mEq/L (ref 19–32)
Calcium: 9.6 mg/dL (ref 8.4–10.5)
Chloride: 106 mEq/L (ref 96–112)
Creatinine, Ser: 1.45 mg/dL (ref 0.40–1.50)
GFR: 46.87 mL/min — ABNORMAL LOW (ref >60.00)
Glucose, Bld: 151 mg/dL — ABNORMAL HIGH (ref 70–99)
Potassium: 4.3 mEq/L (ref 3.5–5.1)
Sodium: 141 mEq/L (ref 135–145)

## 2019-03-19 MED ORDER — LOSARTAN POTASSIUM-HCTZ 50-12.5 MG PO TABS: 1.0000 | ORAL_TABLET | Freq: Every day | ORAL | 0 refills | Status: DC

## 2019-03-19 NOTE — Patient Instructions (Addendum)
Check blood sugars on waking up days a week  Also check blood sugars about 2 hours after meals and do this after different meals by rotation  Recommended blood sugar levels on waking up are 90-130 and about 2 hours after meal is 130-160  Please bring your blood sugar monitor to each visit, thank you  Reduce metformin to half tablet twice a day   Change Losartan to new Rx  May not need Lasix except when swelling

## 2019-03-19 NOTE — Progress Notes (Signed)
Patient ID: Jason Mcpherson, male   DOB: [birth year], 78 y.o.   MRN: [id number]    Reason for Appointment: follow-up    History of Present Illness:          Diagnosis: Type 2 diabetes mellitus, date of diagnosis: 1997       Past history: The patient is unclear about his symptoms at diagnosis as well as initial treatment. Most likely has been on metformin for sometime and at some point was also given glipizide. He is also very unclear about when he was started on insulin and records from his previous endocrinologist are not available; may have been taking this over the last 15 years. Also not clear if he had been on different types of insulin However he was previously significantly overweight His A1c in 9/14 was 5.9 and in 2015 had been about 6.9-7 On his initial consultation he was taking metformin and glipizide ER; he was taking Levemir irregularly based on his blood sugar level Levemir was stopped on his initial consultation in 6/15 He was started on Januvia since 10/29/13 after stopping his Levemir insulin  His A1c in 6/17 had gone up to 7.5 and was started back on Levemir  Recent history:           Oral hypoglycemic drugs the patient is taking are: Metformin 1500 mg daily and Prandin 0.5 mg twice daily   A1c has been usually normal and last higher at 6.5 compared to 5.9  Last fructosamine was 237  Current management and blood sugar analysis:    He had been taken off Amaryl by his PCP and his blood sugars were as high as 251 on his last visit  He was then occasionally taking NPH insulin also but this would cause low sugars  He was started on Prandin 0.5 mg twice daily  He thinks that initially his blood sugars were higher with the change but subsequently they have been improved  No labs available to assess his level of control now  He was told to reduce his Metformin 1000 mg to half tablet twice a day because of higher creatinine but he is still taking 1500 mg  a day because he has a large supply  Blood sugars are excellent both at breakfast time and bedtime  Despite reminders he does not check his sugars after meals  His weight is up 2 pounds  He is trying to get some protein at breakfast with peanut butter toast, however some of his meals are still from restaurant meals in the evening.  Not clear how his blood sugars are after breakfast  Only once has had a relatively high reading of 198 after supper but is checking his blood sugars about 4 hours after eating   Side effects from medications have been: None  Glucose monitoring:  done less than once a day with One Touch monitor Blood Glucose readings from meter download for the last 2 weeks:   PRE-MEAL Fasting Lunch Dinner Bedtime Overall  Glucose range:  97-133    105-198   Mean/median:  116    130  123   Previous readings:  PRE-MEAL Fasting Lunch Dinner Bedtime Overall  Glucose range:  118-190     61-251  Mean/median:  158    165   POST-MEAL PC Breakfast PC Lunch PC Dinner  Glucose range:    127-251  Mean/median:    176       Glycemic control:   Lab Results  Component Value Date   HGBA1C 6.5 (A) 01/22/2019   HGBA1C 5.9 (A) 10/23/2018   HGBA1C 5.3 07/17/2018   Lab Results  Component Value Date   MICROALBUR 4.2 (H) 10/23/2018   LDLCALC 57 01/22/2019   CREATININE 1.71 (H) 01/22/2019    Self-care: The diet that the patient has been following is: tries to limit fats and sweets  He is sometimes eating PBJ in am. Usually eating a sandwich at lunch and meat and salad with beans at dinner.  Eating out about twice a week Usually has low-fat low sugar snacks  Supper at 4:30-5 PM     Exercise:  Occasional walking at [employer] visit:  years ago.            Weight history: Highest weight in the past has been 220 and lowest 157  Wt Readings from Last 3 Encounters:  03/19/19 174 lb 12.8 oz (79.3 kg)  01/22/19 172 lb (78 kg)  10/23/18 172 lb 12.8 oz (78.4 kg)     OTHER problems discussed in review of systems    Allergies as of 03/19/2019   No Known Allergies     Medication List       Accurate as of March 19, 2019  9:53 AM. If you have any questions, ask your nurse or doctor.        STOP taking these medications   glimepiride 1 MG tablet Commonly known as: AMARYL Stopped by: Elayne Snare, MD     TAKE these medications   aspirin 81 MG chewable tablet Chew 81 mg by mouth daily.   betamethasone dipropionate 0.05 % cream Apply topically 2 (two) times daily.   furosemide 20 MG tablet Commonly known as: LASIX TAKE 1 TABLET BY MOUTH EVERY DAY   losartan 100 MG tablet Commonly known as: COZAAR TAKE 1 TABLET BY MOUTH EVERY DAY   losartan-hydrochlorothiazide 50-12.5 MG tablet Commonly known as: HYZAAR Take 1 tablet by mouth daily. Started by: Elayne Snare, MD   metFORMIN 1000 MG tablet Commonly known as: GLUCOPHAGE Take 1,000 mg by mouth 2 (two) times daily with a meal. What changed: Another medication with the same name was removed. Continue taking this medication, and follow the directions you see here. Changed by: Elayne Snare, MD   mupirocin ointment 2 % Commonly known as: BACTROBAN Apply 1 application topically 2 (two) times daily.   OneTouch Delica Lancets Fine Misc USED TO CHECK SUGAR 2X DAILY.   OneTouch Delica Lancets 99991111 Misc USED TO CHECK SUGAR 2X DAILY.   OneTouch Ultra test strip Generic drug: glucose blood USE AS DIRECTED TO TEST TWICE DAILY   repaglinide 0.5 MG tablet Commonly known as: Prandin Take 1 tablet (0.5 mg total) by mouth 2 (two) times daily before a meal.   simvastatin 20 MG tablet Commonly known as: ZOCOR TAKE 1 TABLET BY MOUTH EVERY DAY IN THE EVENING       Allergies: No Known Allergies  Past Medical History:  Diagnosis Date  . Anemia   . Arthritis    KNEES & BACK  . Cellulitis 08/2013   RT LEG  . Chronic kidney disease 08/2013   ACUTE RENAL   . Dehydration   . Diabetes  mellitus without complication ([place])   . Hyperkalemia 08/2013  . Hyperlipemia   . Hypertension     Past Surgical History:  Procedure Laterality Date  . ANTERIOR CERVICAL DECOMP/DISCECTOMY FUSION N/A 10/17/2012   Procedure: ANTERIOR CERVICAL DECOMPRESSION/DISCECTOMY FUSION 2 LEVELS;  Surgeon: Cooper Render  Pool, MD;  Location: [facility];  Service: Neurosurgery;  Laterality: N/A;  Cervical three-four, four-five anterior cervical decompression fusion with allograft plating  . BACK SURGERY     10 years ago  . CERVICAL DISC SURGERY      Family History  Problem Relation Age of Onset  . Cancer - Colon Mother   . Cancer - Prostate Father     Social History:  reports that he quit smoking about 21 years ago. He has never used smokeless tobacco. He reports that he does not drink alcohol or use drugs.    Review of Systems    RENAL dysfunction: Mild and persistent, not seeing nephrologist despite recommendations Etiology unclear Does not take any ibuprofen or naproxen Microalbumin normal  Previous creatinines:  Lab Results  Component Value Date   CREATININE 1.71 (H) 01/22/2019   CREATININE 1.47 10/23/2018   CREATININE 1.46 07/17/2018   CREATININE 1.52 (H) 03/13/2018         Lipids: treated with Zocor 20 mg, last lipids as follows:       Lab Results  Component Value Date   CHOL 139 01/22/2019   HDL 68.50 01/22/2019   LDLCALC 57 01/22/2019   LDLDIRECT 48.9 02/21/2014   TRIG 67.0 01/22/2019   CHOLHDL 2 01/22/2019    HYPERTENSION: Has been treated with losartan 100 mg daily Has been followed by PCP but does not have any appointments till next March Blood pressure appears higher than usual  BP Readings from Last 3 Encounters:  03/19/19 (!) 150/62  01/22/19 140/80  10/23/18 130/70     He is taking Lasix for pedal edema and generally takes it 3 days a week    Physical Examination:  BP (!) 150/62 (BP Location: Left Arm, Patient Position: Sitting, Cuff Size: Normal)    Pulse 83   Ht 5\' 8"  (1.727 m)   Wt 174 lb 12.8 oz (79.3 kg)   SpO2 95%   BMI 26.58 kg/m   No pedal edema present   Skin on the feet including plantar surfaces is intact   ASSESSMENT:   Diabetes type 2, BMI 26   See history of present illness for detailed discussion of current diabetes management, blood sugar patterns and problems identified  He is doing much better with his control judging from his home readings with using Prandin 0.5 mg twice daily before breakfast and suppertime However not clear if his blood sugars are high after breakfast or lunch since he does not monitor these readings Overall trying to eat healthy meals but eats a lot of sandwiches  His Metformin dose is limited because of renal dysfunction but he is still taking 1500 mg without side effects Last creatinine 1.7   Hypertension: His blood pressure is not as well controlled  Because of his renal dysfunction and tendency for edema he will likely do better with reducing his losartan and adding a diuretic  History of anemia to be evaluated by PCP  History of foot ulcers: Has normal foot exam on inspection today  Edema: Controlled with 3 days a week Lasix    PLAN:  He was asked to check more readings after meals However he can continue Prandin Needs to reduce Metformin to 500 mg twice daily for now until renal functions are rechecked today  Change losartan to losartan HCT 50/12.5 and may take Lasix only as needed  A1c to be done in 2 months    Patient Instructions  Check blood sugars on waking up days  a week  Also check blood sugars about 2 hours after meals and do this after different meals by rotation  Recommended blood sugar levels on waking up are 90-130 and about 2 hours after meal is 130-160  Please bring your blood sugar monitor to each visit, thank you  Reduce metformin to half tablet twice a day   Change Losartan to new Rx  May not need Lasix except when swelling       Elayne Snare 03/19/2019, 9:53 AM   Note: This office note was prepared with Dragon voice recognition system technology. Any transcriptional errors that result from this process are unintentional.

## 2019-03-20 ENCOUNTER — Telehealth: Payer: Self-pay

## 2019-03-20 LAB — FRUCTOSAMINE: Fructosamine: 265 umol/L (ref 0–285)

## 2019-03-20 NOTE — Telephone Encounter (Signed)
Please refer to telephone note. Called pt to review lab results and to advise patient about Dr. Ronnie Derby recommendations below. Pt's wife answered the phone and stated that the pt was not available at this time. Wife stated that she would have the pt return the call when he is available.

## 2019-03-20 NOTE — Telephone Encounter (Signed)
Called pt's cell phone and he did not answer, and no voicemail option was given.

## 2019-03-20 NOTE — Telephone Encounter (Signed)
-----   Message from Elayne Snare, MD sent at 03/19/2019  9:02 PM EST ----- Kidney test currently improved, can stay on 1-1/2 tablets a day of the Metformin

## 2019-03-21 NOTE — Telephone Encounter (Signed)
Attempted to call pt's cell phone and no answer was received. Will make one more attempt before sending pt a letter.

## 2019-03-21 NOTE — Telephone Encounter (Signed)
Made another attempt to contact pt and received no answer. Letter has been sent to the pt with Dr. Ronnie Derby message.

## 2019-04-08 ENCOUNTER — Other Ambulatory Visit: Payer: Self-pay | Admitting: Endocrinology

## 2019-04-09 ENCOUNTER — Other Ambulatory Visit: Payer: Self-pay | Admitting: Endocrinology

## 2019-04-09 DIAGNOSIS — R69 Illness, unspecified: Secondary | ICD-10-CM | POA: Diagnosis not present

## 2019-04-16 DIAGNOSIS — R69 Illness, unspecified: Secondary | ICD-10-CM | POA: Diagnosis not present

## 2019-04-22 ENCOUNTER — Other Ambulatory Visit: Payer: Self-pay | Admitting: Endocrinology

## 2019-04-23 ENCOUNTER — Other Ambulatory Visit: Payer: Self-pay | Admitting: Endocrinology

## 2019-05-24 ENCOUNTER — Other Ambulatory Visit: Payer: Self-pay | Admitting: Endocrinology

## 2019-05-28 ENCOUNTER — Ambulatory Visit: Payer: Medicare HMO | Admitting: Podiatry

## 2019-05-30 ENCOUNTER — Other Ambulatory Visit: Payer: Self-pay | Admitting: Endocrinology

## 2019-05-30 DIAGNOSIS — R69 Illness, unspecified: Secondary | ICD-10-CM | POA: Diagnosis not present

## 2019-05-31 DIAGNOSIS — I87322 Chronic venous hypertension (idiopathic) with inflammation of left lower extremity: Secondary | ICD-10-CM | POA: Diagnosis not present

## 2019-06-01 ENCOUNTER — Other Ambulatory Visit: Payer: Self-pay | Admitting: Endocrinology

## 2019-06-01 DIAGNOSIS — R69 Illness, unspecified: Secondary | ICD-10-CM | POA: Diagnosis not present

## 2019-06-13 ENCOUNTER — Other Ambulatory Visit: Payer: Self-pay | Admitting: Endocrinology

## 2019-06-17 ENCOUNTER — Other Ambulatory Visit: Payer: Self-pay | Admitting: Endocrinology

## 2019-06-18 ENCOUNTER — Ambulatory Visit: Payer: Medicare HMO | Admitting: Endocrinology

## 2019-06-19 ENCOUNTER — Encounter: Payer: Self-pay | Admitting: Endocrinology

## 2019-06-19 ENCOUNTER — Ambulatory Visit (INDEPENDENT_AMBULATORY_CARE_PROVIDER_SITE_OTHER): Payer: Medicare HMO | Admitting: Endocrinology

## 2019-06-19 ENCOUNTER — Other Ambulatory Visit: Payer: Self-pay

## 2019-06-19 VITALS — BP 130/70 | HR 86 | Ht 68.0 in | Wt 172.4 lb

## 2019-06-19 DIAGNOSIS — Z794 Long term (current) use of insulin: Secondary | ICD-10-CM | POA: Diagnosis not present

## 2019-06-19 DIAGNOSIS — I10 Essential (primary) hypertension: Secondary | ICD-10-CM

## 2019-06-19 DIAGNOSIS — E1165 Type 2 diabetes mellitus with hyperglycemia: Secondary | ICD-10-CM | POA: Diagnosis not present

## 2019-06-19 DIAGNOSIS — E1142 Type 2 diabetes mellitus with diabetic polyneuropathy: Secondary | ICD-10-CM | POA: Diagnosis not present

## 2019-06-19 DIAGNOSIS — N183 Chronic kidney disease, stage 3 unspecified: Secondary | ICD-10-CM

## 2019-06-19 LAB — POCT GLYCOSYLATED HEMOGLOBIN (HGB A1C): Hemoglobin A1C: 7 % — AB (ref 4.0–5.6)

## 2019-06-19 LAB — LIPID PANEL
Cholesterol: 154 mg/dL (ref 0–200)
HDL: 75 mg/dL (ref >39.00)
LDL Cholesterol: 61 mg/dL (ref 0–99)
NonHDL: 79.03
Total CHOL/HDL Ratio: 2
Triglycerides: 88 mg/dL (ref 0.0–149.0)
VLDL: 17.6 mg/dL (ref 0.0–40.0)

## 2019-06-19 LAB — COMPREHENSIVE METABOLIC PANEL
ALT: 11 U/L (ref 0–53)
AST: 12 U/L (ref 0–37)
Albumin: 4.1 g/dL (ref 3.5–5.2)
Alkaline Phosphatase: 71 U/L (ref 39–117)
BUN: 42 mg/dL — ABNORMAL HIGH (ref 6–23)
CO2: 25 mEq/L (ref 19–32)
Calcium: 9.7 mg/dL (ref 8.4–10.5)
Chloride: 108 mEq/L (ref 96–112)
Creatinine, Ser: 1.67 mg/dL — ABNORMAL HIGH (ref 0.40–1.50)
GFR: 39.79 mL/min — ABNORMAL LOW (ref >60.00)
Glucose, Bld: 239 mg/dL — ABNORMAL HIGH (ref 70–99)
Potassium: 4.5 mEq/L (ref 3.5–5.1)
Sodium: 140 mEq/L (ref 135–145)
Total Bilirubin: 0.4 mg/dL (ref 0.2–1.2)
Total Protein: 6.7 g/dL (ref 6.0–8.3)

## 2019-06-19 MED ORDER — RYBELSUS 3 MG PO TABS: 3.0000 mg | ORAL_TABLET | Freq: Every day | ORAL | 0 refills | Status: DC

## 2019-06-19 NOTE — Progress Notes (Signed)
Patient ID: Jason Mcpherson, male   DOB: 11/01/1940, 79 y.o.   MRN: [id number]    Reason for Appointment: follow-up    History of Present Illness:          Diagnosis: Type 2 diabetes mellitus, date of diagnosis: 1997       Past history: The patient is unclear about his symptoms at diagnosis as well as initial treatment. Most likely has been on metformin for sometime and at some point was also given glipizide. He is also very unclear about when he was started on insulin and records from his previous endocrinologist are not available; may have been taking this over the last 15 years. Also not clear if he had been on different types of insulin However he was previously significantly overweight His A1c in 9/14 was 5.9 and in 2015 had been about 6.9-7 On his initial consultation he was taking metformin and glipizide ER; he was taking Levemir irregularly based on his blood sugar level Levemir was stopped on his initial consultation in 6/15 He was started on Januvia since 10/29/13 after stopping his Levemir insulin  His A1c in 6/17 had gone up to 7.5 and was started back on Levemir  Recent history:           Oral hypoglycemic drugs the patient is taking are: Metformin 1500 mg daily and Prandin 0.5 mg twice daily    Current management and blood sugar analysis:  His A1c is higher than before at 7%, has been as low as 5.9   He was started on Prandin 0.5 mg twice daily in 9/20 because of tendency to hypoglycemia with both Amaryl and NPH insulin  Not clear why his blood sugars are much higher than on his last visit when Prandin was appearing to be working very well  Also fasting blood sugars are mostly high  His blood sugars fluctuate significantly after dinner  He thinks that his wife sometimes will give him high carbohydrate meals and this will raise his blood sugar  He was told to check his blood sugar sometimes after breakfast or 2 hours after dinner but he has a routine  of checking it in the morning around 8-9 AM and also at 8-9 PM  No hypoglycemia  However he says that his wife and refused to see the dietitian  He has difficulty doing much walking  His weight is about the same   Side effects from medications have been: None  Glucose monitoring:  done less than once a day with One Touch monitor Blood Glucose readings from meter download for the last 2 weeks:   PRE-MEAL Fasting Lunch Dinner Bedtime Overall  Glucose range:  147-216    150-323   Mean/median:  172    219    Previous data  PRE-MEAL Fasting Lunch Dinner Bedtime Overall  Glucose range:  97-133    105-198   Mean/median:  116    130  123      Glycemic control:   Lab Results  Component Value Date   HGBA1C 7.0 (A) 06/19/2019   HGBA1C 6.5 (A) 01/22/2019   HGBA1C 5.9 (A) 10/23/2018   Lab Results  Component Value Date   MICROALBUR 4.2 (H) 10/23/2018   LDLCALC 57 01/22/2019   CREATININE 1.45 03/19/2019    Self-care: The diet that the patient has been following is: tries to limit fats and sweets  He is sometimes eating PBJ in am. Usually eating a sandwich at lunch and meat  and salad with beans at dinner.  Eating out about twice a week Usually has low-fat low sugar snacks  Supper at 4:30-5 PM     Exercise:  Occasional walking at [employer] visit:  years ago.            Weight history: Highest weight in the past has been 220 and lowest 157  Wt Readings from Last 3 Encounters:  06/19/19 172 lb 6.4 oz (78.2 kg)  03/19/19 174 lb 12.8 oz (79.3 kg)  01/22/19 172 lb (78 kg)    OTHER problems discussed in review of systems    Allergies as of 06/19/2019   No Known Allergies     Medication List       Accurate as of June 19, 2019  9:57 AM. If you have any questions, ask your nurse or doctor.        aspirin 81 MG chewable tablet Chew 81 mg by mouth daily.   betamethasone dipropionate 0.05 % cream Apply topically 2 (two) times daily.   furosemide 20  MG tablet Commonly known as: LASIX TAKE 1 TABLET BY MOUTH EVERY DAY   losartan 100 MG tablet Commonly known as: COZAAR TAKE 1 TABLET BY MOUTH EVERY DAY   losartan-hydrochlorothiazide 50-12.5 MG tablet Commonly known as: HYZAAR TAKE 1 TABLET BY MOUTH EVERY DAY   metFORMIN 1000 MG tablet Commonly known as: GLUCOPHAGE Take 1,000 mg by mouth 2 (two) times daily with a meal.   mupirocin ointment 2 % Commonly known as: BACTROBAN Apply 1 application topically 2 (two) times daily.   OneTouch Delica Lancets Fine Misc USED TO CHECK SUGAR 2X DAILY.   OneTouch Delica Lancets 99991111 Misc USED TO CHECK SUGAR 2X DAILY.   OneTouch Ultra test strip Generic drug: glucose blood USE AS DIRECTED TO TEST TWICE DAILY   repaglinide 0.5 MG tablet Commonly known as: PRANDIN TAKE 1 TABLET (0.5 MG TOTAL) BY MOUTH 2 (TWO) TIMES DAILY BEFORE A MEAL.   simvastatin 20 MG tablet Commonly known as: ZOCOR TAKE 1 TABLET BY MOUTH EVERY DAY IN THE EVENING       Allergies: No Known Allergies  Past Medical History:  Diagnosis Date  . Anemia   . Arthritis    KNEES & BACK  . Cellulitis 08/2013   RT LEG  . Chronic kidney disease 08/2013   ACUTE RENAL   . Dehydration   . Diabetes mellitus without complication ([place])   . Hyperkalemia 08/2013  . Hyperlipemia   . Hypertension     Past Surgical History:  Procedure Laterality Date  . ANTERIOR CERVICAL DECOMP/DISCECTOMY FUSION N/A 10/17/2012   Procedure: ANTERIOR CERVICAL DECOMPRESSION/DISCECTOMY FUSION 2 LEVELS;  Surgeon: Charlie Pitter, MD;  Location: [facility];  Service: Neurosurgery;  Laterality: N/A;  Cervical three-four, four-five anterior cervical decompression fusion with allograft plating  . BACK SURGERY     10 years ago  . CERVICAL DISC SURGERY      Family History  Problem Relation Age of Onset  . Cancer - Colon Mother   . Cancer - Prostate Father     Social History:  reports that he quit smoking about 21 years ago. He has never used  smokeless tobacco. He reports that he does not drink alcohol or use drugs.    Review of Systems    RENAL dysfunction: Mild and persistent, not followed by nephrologist Etiology unclear Does not take any ibuprofen or naproxen Microalbumin normal  Previous creatinines:  Lab Results  Component Value Date  CREATININE 1.45 03/19/2019   CREATININE 1.71 (H) 01/22/2019   CREATININE 1.47 10/23/2018   CREATININE 1.46 07/17/2018         Lipids: treated with Zocor 20 mg, last lipids as follows:       Lab Results  Component Value Date   CHOL 139 01/22/2019   HDL 68.50 01/22/2019   LDLCALC 57 01/22/2019   LDLDIRECT 48.9 02/21/2014   TRIG 67.0 01/22/2019   CHOLHDL 2 01/22/2019    HYPERTENSION: Has been treated with losartan HCT Blood pressure has improved   BP Readings from Last 3 Encounters:  06/19/19 130/70  03/19/19 (!) 150/62  01/22/19 140/80    He is taking Lasix for pedal edema and does not think he is taking this now since he has been on losartan HCT   Physical Examination:  BP 130/70 (BP Location: Left Arm, Patient Position: Sitting, Cuff Size: Normal)   Pulse 86   Ht 5\' 8"  (1.727 m)   Wt 172 lb 6.4 oz (78.2 kg)   SpO2 94%   BMI 26.21 kg/m   No pedal edema present, mild swelling of the left ankle present   Skin on the feet including plantar surfaces is intact, no active ulceration   ASSESSMENT:   Diabetes type 2, BMI 26   See history of present illness for detailed discussion of current diabetes management, blood sugar patterns and problems identified  Not clear why his blood sugars are not controlled again despite good response to Prandin previously His Metformin was also increased to 1000 mg twice daily since renal function on the last visit was improved  He is not able to modify his diet and has a high carbohydrate intake Also not able to do as much exercise as he needs to He does need meal planning advice and needs to go with his wife who is  preparing his food but she refuses to do so   Hypertension: His blood pressure is improved with adding HCTZ to his losartan  History of foot ulcers: No recurrence, has no evidence of any ulceration or high risk skin changes  Edema: Controlled with only losartan HCTZ and not needing Lasix    PLAN:  He will be given a trial of Rybelsus since this will avoid any tendency to hypoglycemia Discussed with the patient the nature of GLP-1 drugs, the actions on insulin secretion, slowing stomach emptying, reduction of appetite and reduced liver glucose production Explained that Rybelsus improves blood sugar control as well as produces weight loss and reduces cardiovascular events. Explained possible side effects especially nausea and vomiting that may initially; usually side effects improved with time.  Patient to call if nausea or vomiting does not improve within 2 weeks Have explained the need to take the capsules on empty stomach 30 minutes before breakfast daily.  Patient education material given He will continue Prandin until his next visit Also continue Metformin 1 g twice daily unless renal function worse today Labs to be done Advised him to cut back on carbohydrates especially at dinnertime No change in losartan HCT    There are no Patient Instructions on file for this visit.      Elayne Snare 06/19/2019, 9:57 AM   Note: This office note was prepared with Dragon voice recognition system technology. Any transcriptional errors that result from this process are unintentional.

## 2019-06-19 NOTE — Patient Instructions (Signed)
Take new Rx 30 min before Bfst with 4 oz water  Stay on Prandin and metformin

## 2019-07-17 ENCOUNTER — Other Ambulatory Visit: Payer: Self-pay | Admitting: Endocrinology

## 2019-07-22 DIAGNOSIS — R69 Illness, unspecified: Secondary | ICD-10-CM | POA: Diagnosis not present

## 2019-07-24 ENCOUNTER — Ambulatory Visit: Payer: Medicare HMO | Admitting: Endocrinology

## 2019-07-24 ENCOUNTER — Other Ambulatory Visit: Payer: Self-pay

## 2019-07-24 ENCOUNTER — Encounter: Payer: Self-pay | Admitting: Endocrinology

## 2019-07-24 VITALS — BP 120/74 | HR 76 | Ht 68.0 in | Wt 169.6 lb

## 2019-07-24 DIAGNOSIS — I10 Essential (primary) hypertension: Secondary | ICD-10-CM | POA: Diagnosis not present

## 2019-07-24 DIAGNOSIS — Z794 Long term (current) use of insulin: Secondary | ICD-10-CM | POA: Diagnosis not present

## 2019-07-24 DIAGNOSIS — N183 Chronic kidney disease, stage 3 unspecified: Secondary | ICD-10-CM | POA: Diagnosis not present

## 2019-07-24 DIAGNOSIS — E1165 Type 2 diabetes mellitus with hyperglycemia: Secondary | ICD-10-CM

## 2019-07-24 LAB — COMPREHENSIVE METABOLIC PANEL
ALT: 14 U/L (ref 0–53)
AST: 18 U/L (ref 0–37)
Albumin: 4.4 g/dL (ref 3.5–5.2)
Alkaline Phosphatase: 59 U/L (ref 39–117)
BUN: 47 mg/dL — ABNORMAL HIGH (ref 6–23)
CO2: 28 mEq/L (ref 19–32)
Calcium: 10 mg/dL (ref 8.4–10.5)
Chloride: 107 mEq/L (ref 96–112)
Creatinine, Ser: 1.61 mg/dL — ABNORMAL HIGH (ref 0.40–1.50)
GFR: 41.5 mL/min — ABNORMAL LOW (ref >60.00)
Glucose, Bld: 73 mg/dL (ref 70–99)
Potassium: 4.3 mEq/L (ref 3.5–5.1)
Sodium: 140 mEq/L (ref 135–145)
Total Bilirubin: 0.6 mg/dL (ref 0.2–1.2)
Total Protein: 6.9 g/dL (ref 6.0–8.3)

## 2019-07-24 MED ORDER — REPAGLINIDE 1 MG PO TABS: 1.0000 mg | ORAL_TABLET | Freq: Two times a day (BID) | ORAL | 2 refills | Status: DC

## 2019-07-24 NOTE — Progress Notes (Signed)
Patient ID: Jason Mcpherson, male   DOB: [birth year], 79 y.o.   MRN: [id number]    Reason for Appointment: follow-up    History of Present Illness:          Diagnosis: Type 2 diabetes mellitus, date of diagnosis: 1997       Past history: The patient is unclear about his symptoms at diagnosis as well as initial treatment. Most likely has been on metformin for sometime and at some point was also given glipizide. He is also very unclear about when he was started on insulin and records from his previous endocrinologist are not available; may have been taking this over the last 15 years. Also not clear if he had been on different types of insulin However he was previously significantly overweight His A1c in 9/14 was 5.9 and in 2015 had been about 6.9-7 On his initial consultation he was taking metformin and glipizide ER; he was taking Levemir irregularly based on his blood sugar level Levemir was stopped on his initial consultation in 6/15 He was started on Januvia since 10/29/13 after stopping his Levemir insulin  His A1c in 6/17 had gone up to 7.5 and was started back on Levemir  Recent history:           Oral hypoglycemic drugs the patient is taking are: Metformin 2000 mg daily, Rybelsus 3 mg daily, Prandin 0.5 mg twice daily    Current management and blood sugar analysis:  His A1c is higher than before at 7%, has been as low as 5.9   He was started on Rybelsus 3 mg about a month ago  He was supposed to stop Prandin 0.5 mg when this ran out but he still seems to be taking it  He has no nausea with Rybelsus  His blood sugars at least in the last 1 to 2 weeks are excellent now compared to previous readings which were high both morning and nighttime  Also previously had tendency to hypoglycemia with NPH insulin and Amaryl  His weight is slightly lower  As before he does not check his sugars after breakfast as directed, sometimes will eat cereal in the morning  He  was told to reduce his Metformin to 1500 mg because of renal dysfunction but he did not do so  No nausea with either medications   Side effects from medications have been: None  Glucose monitoring:  done less than once a day with One Touch monitor Blood Glucose readings from meter download for the last 2 weeks:   PRE-MEAL Fasting Lunch Dinner Bedtime Overall  Glucose range:  107-154    78-249  78-249  Mean/median:  126    145  135   Previous readings   PRE-MEAL Fasting Lunch Dinner Readings:Bedtime Overall  Glucose range:  147-216    150-323   Mean/median:  172    219       Glycemic control:   Lab Results  Component Value Date   HGBA1C 7.0 (A) 06/19/2019   HGBA1C 6.5 (A) 01/22/2019   HGBA1C 5.9 (A) 10/23/2018   Lab Results  Component Value Date   MICROALBUR 4.2 (H) 10/23/2018   LDLCALC 61 06/19/2019   CREATININE 1.67 (H) 06/19/2019    Self-care: The diet that the patient has been following is: tries to limit fats and sweets  He is sometimes eating PBJ in am. Usually eating a sandwich at lunch and meat and salad with beans at dinner.  Eating out about twice  a week Usually has low-fat low sugar snacks  Supper at 4:30-5 PM     Exercise:  Occasional walking at [employer] visit:  years ago.            Weight history: Highest weight in the past has been 220 and lowest 157  Wt Readings from Last 3 Encounters:  07/24/19 169 lb 9.6 oz (76.9 kg)  06/19/19 172 lb 6.4 oz (78.2 kg)  03/19/19 174 lb 12.8 oz (79.3 kg)    OTHER problems discussed in review of systems    Allergies as of 07/24/2019   No Known Allergies     Medication List       Accurate as of July 24, 2019  9:10 AM. If you have any questions, ask your nurse or doctor.        aspirin 81 MG chewable tablet Chew 81 mg by mouth daily.   betamethasone dipropionate 0.05 % cream Apply topically 2 (two) times daily.   furosemide 20 MG tablet Commonly known as: LASIX TAKE 1 TABLET BY  MOUTH EVERY DAY   losartan-hydrochlorothiazide 50-12.5 MG tablet Commonly known as: HYZAAR TAKE 1 TABLET BY MOUTH EVERY DAY   metFORMIN 1000 MG tablet Commonly known as: GLUCOPHAGE Take 1,000 mg by mouth 2 (two) times daily with a meal.   mupirocin ointment 2 % Commonly known as: BACTROBAN Apply 1 application topically 2 (two) times daily.   OneTouch Delica Lancets Fine Misc USED TO CHECK SUGAR 2X DAILY.   OneTouch Delica Lancets 90W Misc USED TO CHECK SUGAR 2X DAILY.   OneTouch Ultra test strip Generic drug: glucose blood USE AS DIRECTED TO TEST TWICE DAILY   repaglinide 0.5 MG tablet Commonly known as: PRANDIN TAKE 1 TABLET (0.5 MG TOTAL) BY MOUTH 2 (TWO) TIMES DAILY BEFORE A MEAL.   Rybelsus 3 MG Tabs Generic drug: Semaglutide Take 3 mg by mouth daily before breakfast.   simvastatin 20 MG tablet Commonly known as: ZOCOR TAKE 1 TABLET BY MOUTH EVERY DAY IN THE EVENING       Allergies: No Known Allergies  Past Medical History:  Diagnosis Date  . Anemia   . Arthritis    KNEES & BACK  . Cellulitis 08/2013   RT LEG  . Chronic kidney disease 08/2013   ACUTE RENAL   . Dehydration   . Diabetes mellitus without complication ([place])   . Hyperkalemia 08/2013  . Hyperlipemia   . Hypertension     Past Surgical History:  Procedure Laterality Date  . ANTERIOR CERVICAL DECOMP/DISCECTOMY FUSION N/A 10/17/2012   Procedure: ANTERIOR CERVICAL DECOMPRESSION/DISCECTOMY FUSION 2 LEVELS;  Surgeon: Charlie Pitter, MD;  Location: [facility];  Service: Neurosurgery;  Laterality: N/A;  Cervical three-four, four-five anterior cervical decompression fusion with allograft plating  . BACK SURGERY     10 years ago  . CERVICAL DISC SURGERY      Family History  Problem Relation Age of Onset  . Cancer - Colon Mother   . Cancer - Prostate Father     Social History:  reports that he quit smoking about 21 years ago. He has never used smokeless tobacco. He reports that he does not drink  alcohol or use drugs.    Review of Systems    RENAL dysfunction: He has not been evaluated by nephrology Etiology unclear Does not take any ibuprofen or naproxen Microalbumin normal  Renal function history:  Lab Results  Component Value Date   CREATININE 1.67 (H) 06/19/2019  CREATININE 1.45 03/19/2019   CREATININE 1.71 (H) 01/22/2019   CREATININE 1.47 10/23/2018         Lipids: treated with Zocor 20 mg, last lipids as follows:       Lab Results  Component Value Date   CHOL 154 06/19/2019   HDL 75.00 06/19/2019   LDLCALC 61 06/19/2019   LDLDIRECT 48.9 02/21/2014   TRIG 88.0 06/19/2019   CHOLHDL 2 06/19/2019    HYPERTENSION: Has been treated with losartan HCT Blood pressure has been controlled   BP Readings from Last 3 Encounters:  07/24/19 120/74  06/19/19 130/70  03/19/19 (!) 150/62    He is taking Lasix for pedal edema and currently taking only 3 days a week because of frequent urination  He thinks his legs are swelling again but has not followed up with PCP   Physical Examination:  BP 120/74 (BP Location: Left Arm, Patient Position: Sitting, Cuff Size: Normal)   Pulse 76   Ht 5\' 8"  (1.727 m)   Wt 169 lb 9.6 oz (76.9 kg)   SpO2 97%   BMI 25.79 kg/m   Lower leg and pedal edema present especially on the left   ASSESSMENT:   Diabetes type 2, BMI 26   See history of present illness for detailed discussion of current diabetes management, blood sugar patterns and problems identified  Although his last A1c was 7% he tends to have significant postprandial hyperglycemia  Currently with Rybelsus his blood sugars are excellent especially recently and only with 3 mg every morning However he does not want to continue this because of cost Also weight is coming down with this   Hypertension: His blood pressure is controlled  Edema: May be related to renal dysfunction, not taking Lasix daily and edema is relatively worse now   PLAN:  He will go up  on the dose of Prandin and try 1 mg before breakfast and dinner, may consider higher dose if needed Otherwise may also consider glipizide ER if fasting readings are consistently high Emphasized the need to check blood sugars after breakfast alternating with fasting readings He will need to make sure he is avoiding high-fat and high carbohydrate meals Adjust Metformin based on renal function studies today Consider patient assistance program for Rybelsus although unlikely that he will be covered Check fructosamine along with A1c for better assessment of his control  Need to take Lasix daily for edema control Encouraged him to follow-up with his PCP for evaluation of renal dysfunction and monitoring for anemia May also consider stopping the losartan if renal function is worse   There are no Patient Instructions on file for this visit.      Elayne Snare 07/24/2019, 9:10 AM   Note: This office note was prepared with Dragon voice recognition system technology. Any transcriptional errors that result from this process are unintentional.

## 2019-07-24 NOTE — Patient Instructions (Signed)
Check blood sugars on waking up days a week  Also check blood sugars about 2 hours after meals and do this after different meals by rotation  Recommended blood sugar levels on waking up are 90-130 and about 2 hours after meal is 130-160  Please bring your blood sugar monitor to each visit, thank you  Stop Rybelsus

## 2019-07-27 ENCOUNTER — Other Ambulatory Visit: Payer: Self-pay | Admitting: Endocrinology

## 2019-07-27 DIAGNOSIS — R69 Illness, unspecified: Secondary | ICD-10-CM | POA: Diagnosis not present

## 2019-07-31 ENCOUNTER — Telehealth: Payer: Self-pay

## 2019-07-31 NOTE — Telephone Encounter (Signed)
-----   Message from Elayne Snare, MD sent at 07/27/2019  9:08 AM EDT ----- Kidney function test is not improved, he needs to discuss with his PCP

## 2019-07-31 NOTE — Telephone Encounter (Signed)
Attempts have been made to reach pt but all attempts have been unsuccessful. Therefore, letter has been mailed.

## 2019-09-08 ENCOUNTER — Other Ambulatory Visit: Payer: Self-pay | Admitting: Endocrinology

## 2019-09-11 ENCOUNTER — Other Ambulatory Visit: Payer: Self-pay | Admitting: Endocrinology

## 2019-09-11 DIAGNOSIS — R69 Illness, unspecified: Secondary | ICD-10-CM | POA: Diagnosis not present

## 2019-09-13 DIAGNOSIS — R69 Illness, unspecified: Secondary | ICD-10-CM | POA: Diagnosis not present

## 2019-10-11 ENCOUNTER — Other Ambulatory Visit: Payer: Self-pay | Admitting: Endocrinology

## 2019-10-16 ENCOUNTER — Encounter: Payer: Self-pay | Admitting: Endocrinology

## 2019-10-16 ENCOUNTER — Other Ambulatory Visit: Payer: Self-pay

## 2019-10-16 ENCOUNTER — Ambulatory Visit: Payer: Medicare HMO | Admitting: Endocrinology

## 2019-10-16 VITALS — BP 140/70 | HR 104 | Ht 68.0 in | Wt 170.6 lb

## 2019-10-16 DIAGNOSIS — I10 Essential (primary) hypertension: Secondary | ICD-10-CM | POA: Diagnosis not present

## 2019-10-16 DIAGNOSIS — Z794 Long term (current) use of insulin: Secondary | ICD-10-CM | POA: Diagnosis not present

## 2019-10-16 DIAGNOSIS — N183 Chronic kidney disease, stage 3 unspecified: Secondary | ICD-10-CM

## 2019-10-16 DIAGNOSIS — E1165 Type 2 diabetes mellitus with hyperglycemia: Secondary | ICD-10-CM | POA: Diagnosis not present

## 2019-10-16 LAB — URINALYSIS, ROUTINE W REFLEX MICROSCOPIC
Bilirubin Urine: NEGATIVE
Hgb urine dipstick: NEGATIVE
Ketones, ur: NEGATIVE
Leukocytes,Ua: NEGATIVE
Nitrite: NEGATIVE
Specific Gravity, Urine: 1.015 (ref 1.000–1.030)
Total Protein, Urine: 30 — AB
Urine Glucose: NEGATIVE
Urobilinogen, UA: 0.2 (ref 0.0–1.0)
pH: 6 (ref 5.0–8.0)

## 2019-10-16 LAB — COMPREHENSIVE METABOLIC PANEL
ALT: 11 U/L (ref 0–53)
AST: 14 U/L (ref 0–37)
Albumin: 4.4 g/dL (ref 3.5–5.2)
Alkaline Phosphatase: 61 U/L (ref 39–117)
BUN: 32 mg/dL — ABNORMAL HIGH (ref 6–23)
CO2: 27 mEq/L (ref 19–32)
Calcium: 9.9 mg/dL (ref 8.4–10.5)
Chloride: 109 mEq/L (ref 96–112)
Creatinine, Ser: 1.33 mg/dL (ref 0.40–1.50)
GFR: 51.7 mL/min — ABNORMAL LOW (ref >60.00)
Glucose, Bld: 78 mg/dL (ref 70–99)
Potassium: 4.5 mEq/L (ref 3.5–5.1)
Sodium: 141 mEq/L (ref 135–145)
Total Bilirubin: 0.6 mg/dL (ref 0.2–1.2)
Total Protein: 6.9 g/dL (ref 6.0–8.3)

## 2019-10-16 LAB — MICROALBUMIN / CREATININE URINE RATIO
Creatinine,U: 74.1 mg/dL
Microalb Creat Ratio: 24.5 mg/g (ref 0.0–30.0)
Microalb, Ur: 18.2 mg/dL — ABNORMAL HIGH (ref 0.0–1.9)

## 2019-10-16 LAB — POCT GLYCOSYLATED HEMOGLOBIN (HGB A1C): Hemoglobin A1C: 5.4 % (ref 4.0–5.6)

## 2019-10-16 MED ORDER — METFORMIN HCL 1000 MG PO TABS: ORAL_TABLET | ORAL | 2 refills | Status: DC

## 2019-10-16 MED ORDER — REPAGLINIDE 1 MG PO TABS: 1.0000 mg | ORAL_TABLET | Freq: Two times a day (BID) | ORAL | 2 refills | Status: DC

## 2019-10-16 NOTE — Patient Instructions (Signed)
Metformin 1/2 in pm and 1 in am  Check blood sugars on waking up 4 days a week  Also check blood sugars about 2 hours after meals and do this after different meals by rotation  Recommended blood sugar levels on waking up are 90-130 and about 2 hours after meal is 130-160  Please bring your blood sugar monitor to each visit, thank you  Call if getting low sugars  Have a starch with dinner daily

## 2019-10-16 NOTE — Progress Notes (Signed)
Patient ID: Jason Mcpherson, male   DOB: 11/04/1940, 79 y.o.   MRN: [id number]    Reason for Appointment: follow-up    History of Present Illness:          Diagnosis: Type 2 diabetes mellitus, date of diagnosis: 1997       Past history: The patient is unclear about his symptoms at diagnosis as well as initial treatment. Most likely has been on metformin for sometime and at some point was also given glipizide. He is also very unclear about when he was started on insulin and records from his previous endocrinologist are not available; may have been taking this over the last 15 years. Also not clear if he had been on different types of insulin However he was previously significantly overweight His A1c in 9/14 was 5.9 and in 2015 had been about 6.9-7 On his initial consultation he was taking metformin and glipizide ER; he was taking Levemir irregularly based on his blood sugar level Levemir was stopped on his initial consultation in 6/15 He was started on Januvia since 10/29/13 after stopping his Levemir insulin  His A1c in 6/17 had gone up to 7.5 and was started back on Levemir  Recent history:           Oral hypoglycemic drugs the patient is taking are: Metformin 2000 mg daily, Prandin 1.0 mg twice daily   Current management and blood sugar analysis:  His A1c is much lower than before at 5.4 compared to 7%   He was taken off Rybelsus 3 mg in March because of excess cost  He is now taking Prandin 1 mg before breakfast and dinnertime  With this his blood sugars are excellent with near normal readings most of the time except only a couple of outliers at night after dinner  He is trying to take this 30-minute before eating  His only low blood sugar was 62 on 6/8 but does not remember what he ate that day and had only mild symptoms  As before is not able to do much walking  His weight is about the same  He was told to reduce his Metformin to 1500 mg because of renal  dysfunction but he still takes 1 g twice daily on his own  Not checking blood sugars after breakfast despite reminders   Side effects from medications have been: None  Glucose monitoring:  done less than once a day with One Touch monitor  Blood Glucose readings and averages from meter download for the last 2 weeks:   PRE-MEAL Fasting Lunch Dinner Bedtime Overall  Glucose range:  87-124      Mean/median:  106     113   POST-MEAL PC Breakfast PC Lunch PC Dinner  Glucose range:    62-206  Average:    121     PRE-MEAL Fasting Lunch Dinner Bedtime Overall  Glucose range:  107-154    78-249  78-249  Mean/median:  126    145  135      Glycemic control:   Lab Results  Component Value Date   HGBA1C 5.4 10/16/2019   HGBA1C 7.0 (A) 06/19/2019   HGBA1C 6.5 (A) 01/22/2019   Lab Results  Component Value Date   MICROALBUR 4.2 (H) 10/23/2018   LDLCALC 61 06/19/2019   CREATININE 1.61 (H) 07/24/2019    Self-care: The diet that the patient has been following is: tries to limit fats and sweets  He is sometimes eating PBJ in  am. Usually eating a sandwich at lunch and meat and salad with beans at dinner.  Eating out about twice a week Usually has low-fat low su3gar snacks  Supper at 4:30-5 PM     Exercise:  Occasional walking at [employer] visit:  years ago.            Weight history: Highest weight in the past has been 220 and lowest 157  Wt Readings from Last 3 Encounters:  10/16/19 170 lb 9.6 oz (77.4 kg)  07/24/19 169 lb 9.6 oz (76.9 kg)  06/19/19 172 lb 6.4 oz (78.2 kg)    OTHER problems discussed in review of systems    Allergies as of 10/16/2019   No Known Allergies     Medication List       Accurate as of October 16, 2019  1:05 PM. If you have any questions, ask your nurse or doctor.        aspirin 81 MG chewable tablet Chew 81 mg by mouth daily.   betamethasone dipropionate 0.05 % cream Apply topically 2 (two) times daily.   furosemide 20 MG  tablet Commonly known as: LASIX TAKE 1 TABLET BY MOUTH EVERY DAY   losartan-hydrochlorothiazide 50-12.5 MG tablet Commonly known as: HYZAAR TAKE 1 TABLET BY MOUTH EVERY DAY   metFORMIN 1000 MG tablet Commonly known as: GLUCOPHAGE 1 tab in am and 1/2 in pm What changed:   how much to take  how to take this  when to take this  additional instructions Changed by: Elayne Snare, MD   mupirocin ointment 2 % Commonly known as: BACTROBAN Apply 1 application topically 2 (two) times daily.   OneTouch Delica Lancets Fine Misc USED TO CHECK SUGAR 2X DAILY.   OneTouch Delica Lancets 82N Misc USED TO CHECK SUGAR 2X DAILY.   OneTouch Ultra test strip Generic drug: glucose blood USE AS DIRECTED TO TEST TWICE DAILY   repaglinide 1 MG tablet Commonly known as: PRANDIN Take 1 tablet (1 mg total) by mouth 2 (two) times daily before a meal. What changed: Another medication with the same name was removed. Continue taking this medication, and follow the directions you see here. Changed by: Elayne Snare, MD   simvastatin 20 MG tablet Commonly known as: ZOCOR TAKE 1 TABLET BY MOUTH EVERY DAY IN THE EVENING       Allergies: No Known Allergies  Past Medical History:  Diagnosis Date  . Anemia   . Arthritis    KNEES & BACK  . Cellulitis 08/2013   RT LEG  . Chronic kidney disease 08/2013   ACUTE RENAL   . Dehydration   . Diabetes mellitus without complication ([place])   . Hyperkalemia 08/2013  . Hyperlipemia   . Hypertension     Past Surgical History:  Procedure Laterality Date  . ANTERIOR CERVICAL DECOMP/DISCECTOMY FUSION N/A 10/17/2012   Procedure: ANTERIOR CERVICAL DECOMPRESSION/DISCECTOMY FUSION 2 LEVELS;  Surgeon: Charlie Pitter, MD;  Location: [facility];  Service: Neurosurgery;  Laterality: N/A;  Cervical three-four, four-five anterior cervical decompression fusion with allograft plating  . BACK SURGERY     10 years ago  . CERVICAL DISC SURGERY      Family History  Problem  Relation Age of Onset  . Cancer - Colon Mother   . Cancer - Prostate Father     Social History:  reports that he quit smoking about 22 years ago. He has never used smokeless tobacco. He reports that he does not drink alcohol  and does not use drugs.    Review of Systems    RENAL dysfunction: He has not been evaluated by nephrology Etiology unclear Does not take any ibuprofen or naproxen Microalbumin normal  Renal function history:  Lab Results  Component Value Date   CREATININE 1.61 (H) 07/24/2019   CREATININE 1.67 (H) 06/19/2019   CREATININE 1.45 03/19/2019   CREATININE 1.71 (H) 01/22/2019         Lipids: treated with Zocor 20 mg, last lipids as follows:       Lab Results  Component Value Date   CHOL 154 06/19/2019   HDL 75.00 06/19/2019   LDLCALC 61 06/19/2019   LDLDIRECT 48.9 02/21/2014   TRIG 88.0 06/19/2019   CHOLHDL 2 06/19/2019    HYPERTENSION: Has been treated with losartan HCT Blood pressure has been controlled   BP Readings from Last 3 Encounters:  10/16/19 140/70  07/24/19 120/74  06/19/19 130/70    He is on Lasix for pedal edema and taking only 3 days a week because of frequent urination    Physical Examination:  BP 140/70 (BP Location: Left Arm, Patient Position: Sitting, Cuff Size: Normal)   Pulse (!) 104   Ht 5\' 8"  (1.727 m)   Wt 170 lb 9.6 oz (77.4 kg)   SpO2 98%   BMI 25.94 kg/m    Diabetic Foot Exam - Simple   Simple Foot Form Diabetic Foot exam was performed with the following findings: Yes   Visual Inspection See comments: Yes Sensation Testing See comments: Yes Pulse Check Posterior Tibialis and Dorsalis pulse intact bilaterally: Yes Comments Osteoarthritic changes in the toes of the left foot Absent monofilament sensation in distal left plantar surface and decreased sensation on dorsal left toes. Sensation relatively normal on right toes No skin breakdown or ulceration    No edema  ASSESSMENT:   Diabetes type  2, BMI 26   See history of present illness for detailed discussion of current diabetes management, blood sugar patterns and problems identified  His A1c is only 5.4 compared to 7% now  He is taking Prandin twice daily which is more affordable than Rybelsus Also taking maximum dose Metformin Although his blood sugars are probably too tightly controlled after dinner with average only 121 he refuses to reduce the suppertime Prandin dose down to 0.5 mg No hypoglycemia except once and not clear if this is related to variable diet  Weight has leveled off He has been again forgetting to check blood sugars after breakfast   Hypertension: His blood pressure is consistently controlled  Peripheral neuropathy: Has more signs of sensory loss on the left   PLAN:  Check renal function today Reduce Metformin to half tablet in the evening because of persistent renal dysfunction and explained to him the reason for doing this He will call if he has more frequent low blood sugar He will make sure he has balanced meals with at least 1 carbohydrate exchange at dinnertime No blood sugars after breakfast and less on waking up Walk as tolerated  Encouraged him to schedule his eye exams and he will discuss with his wife who is going to schedule   Patient Instructions  Metformin 1/2 in pm and 1 in am  Check blood sugars on waking up 4 days a week  Also check blood sugars about 2 hours after meals and do this after different meals by rotation  Recommended blood sugar levels on waking up are 90-130 and about 2 hours after meal is  130-160  Please bring your blood sugar monitor to each visit, thank you  Call if getting low sugars  Have a starch with dinner daily         Elayne Snare 10/16/2019, 1:05 PM   Note: This office note was prepared with Dragon voice recognition system technology. Any transcriptional errors that result from this process are unintentional.

## 2019-10-31 DIAGNOSIS — R69 Illness, unspecified: Secondary | ICD-10-CM | POA: Diagnosis not present

## 2019-11-05 ENCOUNTER — Other Ambulatory Visit: Payer: Self-pay | Admitting: Endocrinology

## 2019-11-06 DIAGNOSIS — R69 Illness, unspecified: Secondary | ICD-10-CM | POA: Diagnosis not present

## 2019-11-22 ENCOUNTER — Other Ambulatory Visit: Payer: Self-pay | Admitting: Endocrinology

## 2019-12-07 ENCOUNTER — Other Ambulatory Visit: Payer: Self-pay | Admitting: Endocrinology

## 2019-12-07 NOTE — Telephone Encounter (Signed)
Please advise 

## 2019-12-16 ENCOUNTER — Other Ambulatory Visit: Payer: Self-pay | Admitting: Endocrinology

## 2019-12-17 DIAGNOSIS — R69 Illness, unspecified: Secondary | ICD-10-CM | POA: Diagnosis not present

## 2019-12-28 DIAGNOSIS — R69 Illness, unspecified: Secondary | ICD-10-CM | POA: Diagnosis not present

## 2020-01-04 ENCOUNTER — Other Ambulatory Visit: Payer: Self-pay | Admitting: Endocrinology

## 2020-01-15 ENCOUNTER — Ambulatory Visit: Payer: Medicare HMO | Admitting: Endocrinology

## 2020-01-15 ENCOUNTER — Other Ambulatory Visit: Payer: Self-pay

## 2020-01-15 ENCOUNTER — Encounter: Payer: Self-pay | Admitting: Endocrinology

## 2020-01-15 VITALS — BP 142/70 | HR 64 | Wt 169.0 lb

## 2020-01-15 DIAGNOSIS — Z23 Encounter for immunization: Secondary | ICD-10-CM | POA: Diagnosis not present

## 2020-01-15 DIAGNOSIS — E1165 Type 2 diabetes mellitus with hyperglycemia: Secondary | ICD-10-CM

## 2020-01-15 DIAGNOSIS — Z794 Long term (current) use of insulin: Secondary | ICD-10-CM

## 2020-01-15 DIAGNOSIS — N183 Chronic kidney disease, stage 3 unspecified: Secondary | ICD-10-CM | POA: Diagnosis not present

## 2020-01-15 DIAGNOSIS — E1142 Type 2 diabetes mellitus with diabetic polyneuropathy: Secondary | ICD-10-CM | POA: Diagnosis not present

## 2020-01-15 LAB — COMPREHENSIVE METABOLIC PANEL
ALT: 9 U/L (ref 0–53)
AST: 12 U/L (ref 0–37)
Albumin: 4.3 g/dL (ref 3.5–5.2)
Alkaline Phosphatase: 54 U/L (ref 39–117)
BUN: 30 mg/dL — ABNORMAL HIGH (ref 6–23)
CO2: 26 mEq/L (ref 19–32)
Calcium: 9.8 mg/dL (ref 8.4–10.5)
Chloride: 108 mEq/L (ref 96–112)
Creatinine, Ser: 1.34 mg/dL (ref 0.40–1.50)
GFR: 51.22 mL/min — ABNORMAL LOW (ref >60.00)
Glucose, Bld: 84 mg/dL (ref 70–99)
Potassium: 4.5 mEq/L (ref 3.5–5.1)
Sodium: 142 mEq/L (ref 135–145)
Total Bilirubin: 0.6 mg/dL (ref 0.2–1.2)
Total Protein: 6.9 g/dL (ref 6.0–8.3)

## 2020-01-15 LAB — POCT GLYCOSYLATED HEMOGLOBIN (HGB A1C): Hemoglobin A1C: 5.8 % — AB (ref 4.0–5.6)

## 2020-01-15 NOTE — Progress Notes (Signed)
Patient ID: Jason Mcpherson, male   DOB: 1940-07-29, 79 y.o.   MRN: [id number]    Reason for Appointment: follow-up    History of Present Illness:          Diagnosis: Type 2 diabetes mellitus, date of diagnosis: 1997       Past history: The patient is unclear about his symptoms at diagnosis as well as initial treatment. Most likely has been on metformin for sometime and at some point was also given glipizide. He is also very unclear about when he was started on insulin and records from his previous endocrinologist are not available; may have been taking this over the last 15 years. Also not clear if he had been on different types of insulin However he was previously significantly overweight His A1c in 9/14 was 5.9 and in 2015 had been about 6.9-7 On his initial consultation he was taking metformin and glipizide ER; he was taking Levemir irregularly based on his blood sugar level Levemir was stopped on his initial consultation in 6/15 He was started on Januvia since 10/29/13 after stopping his Levemir insulin  His A1c in 6/17 had gone up to 7.5 and was started back on Levemir  Recent history:           Oral hypoglycemic drugs the patient is taking are: Metformin 2000 mg daily, Prandin 1.0 mg twice daily   Current management and blood sugar analysis:  His A1c is again lower than expected at 5.8, previously 5.4   He is still forgetting to check his sugars after meals in the morning or lunchtime  He is still taking Prandin 1 mg before breakfast and dinnertime  His blood sugars are generally fairly well controlled, however relatively lower after dinner about 2 weeks ago but now reasonably good  He says his blood sugar may get low in the evening if he is only eating a salad which he does occasionally  Recently has had only one relatively high reading of 189  No other hypoglycemia also  FASTING blood sugars are in excellent range below the 130 target  He is usually  trying to take Prandin before starting the weight as directed  He was told to reduce his Metformin to 1500 mg because of renal dysfunction but he still takes 1 g twice daily because he thinks his blood sugar went up to 364 when he did this once  His weight is about the same   Side effects from medications have been: None  Glucose monitoring:  done less than once a day with One Touch monitor  Blood Glucose readings and averages from meter download for the last 2 weeks:   PRE-MEAL Fasting Lunch Dinner Bedtime Overall  Glucose range:  120-146      Mean/median:  129     128   POST-MEAL PC Breakfast PC Lunch PC Dinner  Glucose range:    64-189  Mean/median:    126   Previously:   PRE-MEAL Fasting Lunch Dinner Bedtime Overall  Glucose range:  87-124      Mean/median:  106     113   POST-MEAL PC Breakfast PC Lunch PC Dinner  Glucose range:    62-206  Average:    121       Glycemic control:   Lab Results  Component Value Date   HGBA1C 5.8 (A) 01/15/2020   HGBA1C 5.4 10/16/2019   HGBA1C 7.0 (A) 06/19/2019   Lab Results  Component Value Date  MICROALBUR 18.2 (H) 10/16/2019   LDLCALC 61 06/19/2019   CREATININE 1.33 10/16/2019    Self-care: The diet that the patient has been following is: tries to limit fats and sweets  He is sometimes eating PBJ in am. Usually eating a sandwich at lunch and meat and salad with beans at dinner.  Eating out about twice a week Usually has low-fat low su3gar snacks  Supper at 4:30-5 PM   Dietician visit:  years ago.            Weight history: Highest weight in the past has been 220 and lowest 157  Wt Readings from Last 3 Encounters:  01/15/20 169 lb (76.7 kg)  10/16/19 170 lb 9.6 oz (77.4 kg)  07/24/19 169 lb 9.6 oz (76.9 kg)    OTHER problems discussed in review of systems    Allergies as of 01/15/2020   No Known Allergies     Medication List       Accurate as of January 15, 2020  9:23 AM. If you have any questions,  ask your nurse or doctor.        aspirin 81 MG chewable tablet Chew 81 mg by mouth daily.   betamethasone dipropionate 0.05 % cream Apply topically 2 (two) times daily.   furosemide 20 MG tablet Commonly known as: LASIX Take one 20mg  tablet 3 times weekly for pedal edema   losartan-hydrochlorothiazide 50-12.5 MG tablet Commonly known as: HYZAAR TAKE 1 TABLET BY MOUTH EVERY DAY   metFORMIN 1000 MG tablet Commonly known as: GLUCOPHAGE 1 tab in am and 1/2 in pm   mupirocin ointment 2 % Commonly known as: BACTROBAN Apply 1 application topically 2 (two) times daily.   OneTouch Delica Lancets Fine Misc USED TO CHECK SUGAR 2X DAILY.   OneTouch Delica Plus [id number] Misc USED TO CHECK SUGAR 2X DAILY.   OneTouch Ultra test strip Generic drug: glucose blood USE AS DIRECTED TO TEST TWICE DAILY   repaglinide 1 MG tablet Commonly known as: PRANDIN Take 1 tablet (1 mg total) by mouth 2 (two) times daily before a meal.   simvastatin 20 MG tablet Commonly known as: ZOCOR TAKE 1 TABLET BY MOUTH EVERY DAY IN THE EVENING       Allergies: No Known Allergies  Past Medical History:  Diagnosis Date  . Anemia   . Arthritis    KNEES & BACK  . Cellulitis 08/2013   RT LEG  . Chronic kidney disease 08/2013   ACUTE RENAL   . Dehydration   . Diabetes mellitus without complication ([place])   . Hyperkalemia 08/2013  . Hyperlipemia   . Hypertension     Past Surgical History:  Procedure Laterality Date  . ANTERIOR CERVICAL DECOMP/DISCECTOMY FUSION N/A 10/17/2012   Procedure: ANTERIOR CERVICAL DECOMPRESSION/DISCECTOMY FUSION 2 LEVELS;  Surgeon: Charlie Pitter, MD;  Location: [facility] [place];  Service: Neurosurgery;  Laterality: N/A;  Cervical three-four, four-five anterior cervical decompression fusion with allograft plating  . BACK SURGERY     10 years ago  . CERVICAL DISC SURGERY      Family History  Problem Relation Age of Onset  . Cancer - Colon Mother   . Cancer - Prostate Father      Social History:  reports that he quit smoking about 22 years ago. He has never used smokeless tobacco. He reports that he does not drink alcohol and does not use drugs.    Review of Systems    RENAL dysfunction: He has not been  evaluated by nephrology Etiology unclear  Microalbumin normal  Renal function history:  Lab Results  Component Value Date   CREATININE 1.33 10/16/2019   CREATININE 1.61 (H) 07/24/2019   CREATININE 1.67 (H) 06/19/2019   CREATININE 1.45 03/19/2019         Lipids: treated with Zocor 20 mg, last lipids as follows:       Lab Results  Component Value Date   CHOL 154 06/19/2019   HDL 75.00 06/19/2019   LDLCALC 61 06/19/2019   LDLDIRECT 48.9 02/21/2014   TRIG 88.0 06/19/2019   CHOLHDL 2 06/19/2019    HYPERTENSION: Has been treated with losartan HCT Blood pressure has been generally controlled   BP Readings from Last 3 Encounters:  01/15/20 (!) 142/70  10/16/19 140/70  07/24/19 120/74    He is on Lasix for pedal edema and taking only 3 days a week because of frequent urination  He is having some difficulties with balance and has fallen  Physical Examination:  BP (!) 142/70   Pulse 64   Wt 169 lb (76.7 kg)   SpO2 96%   BMI 25.70 kg/m   No ulcerations on his feet, has scaling on the right second toe at the end without any ulceration. Has great toe deformities of his left foot distally  ASSESSMENT:   Diabetes type 2, BMI 26   See history of present illness for detailed discussion of current diabetes management, blood sugar patterns and problems identified  His A1c is still under 6%  He is taking Prandin twice daily with improvement in his postprandial readings Also taking maximum dose Metformin Currently has only rare low normal blood sugars only when he is eating salads  Not clear if his blood sugars are adequately controlled after breakfast but he does not report any low sugar symptoms with the morning  Prandin   Hypertension: His blood pressure is well controlled, high normal today from his not taking his losartan  Peripheral neuropathy: Foot exam on inspection is not showing any recurrence of ulcers   PLAN:  Check renal function today and decide on dose of Metformin, discussed that he has reduced GFR and may need to reduce Metformin He needs to check readings after breakfast at time If he is eating salad he will skip Prandin on Thursdays  Follow-up with PCP regarding balance issues and falls   There are no Patient Instructions on file for this visit.      Elayne Snare 01/15/2020, 9:23 AM   Note: This office note was prepared with Dragon voice recognition system technology. Any transcriptional errors that result from this process are unintentional.

## 2020-01-15 NOTE — Patient Instructions (Signed)
Check blood sugars on waking up 3 days a week  Also check blood sugars about 2 hours after meals and do this after different meals by rotation  Recommended blood sugar levels on waking up are 90-130 and about 2 hours after meal is 130-160  Please bring your blood sugar monitor to each visit, thank you  Some sugars after breakfast  If eating salad don't take Repeglanide

## 2020-01-16 ENCOUNTER — Other Ambulatory Visit: Payer: Self-pay | Admitting: Endocrinology

## 2020-01-21 DIAGNOSIS — M21372 Foot drop, left foot: Secondary | ICD-10-CM | POA: Diagnosis not present

## 2020-01-21 DIAGNOSIS — W19XXXA Unspecified fall, initial encounter: Secondary | ICD-10-CM | POA: Diagnosis not present

## 2020-01-27 NOTE — Progress Notes (Signed)
Please call to let patient know that the kidney tests are better.  Please remind him to check some readings after breakfast instead of before eating for his next visit

## 2020-01-28 ENCOUNTER — Telehealth: Payer: Self-pay

## 2020-01-28 NOTE — Telephone Encounter (Signed)
-----   Message from Elayne Snare, MD sent at 01/27/2020  9:16 PM EDT ----- Please call to let patient know that the kidney tests are better.  Please remind him to check some readings after breakfast instead of before eating for his next visit

## 2020-01-28 NOTE — Telephone Encounter (Signed)
Left message for patient to call office regarding results and recommendations. 

## 2020-02-04 DIAGNOSIS — R69 Illness, unspecified: Secondary | ICD-10-CM | POA: Diagnosis not present

## 2020-02-05 DIAGNOSIS — R69 Illness, unspecified: Secondary | ICD-10-CM | POA: Diagnosis not present

## 2020-02-08 ENCOUNTER — Other Ambulatory Visit: Payer: Self-pay | Admitting: Endocrinology

## 2020-03-17 ENCOUNTER — Telehealth: Payer: Self-pay | Admitting: Endocrinology

## 2020-03-17 ENCOUNTER — Other Ambulatory Visit: Payer: Self-pay | Admitting: *Deleted

## 2020-03-17 MED ORDER — LOSARTAN POTASSIUM-HCTZ 50-12.5 MG PO TABS: 1.0000 | ORAL_TABLET | Freq: Every day | ORAL | 1 refills | Status: DC

## 2020-03-17 NOTE — Telephone Encounter (Signed)
Rx sent 

## 2020-03-17 NOTE — Telephone Encounter (Signed)
Patient called and requested a refill on his Losartan HCTZ. This is for 90 days and goes to the [facility] in [place] on [address]

## 2020-03-21 ENCOUNTER — Other Ambulatory Visit: Payer: Self-pay | Admitting: Endocrinology

## 2020-03-30 ENCOUNTER — Other Ambulatory Visit: Payer: Self-pay | Admitting: Endocrinology

## 2020-04-10 ENCOUNTER — Other Ambulatory Visit: Payer: Self-pay | Admitting: Endocrinology

## 2020-04-10 DIAGNOSIS — R69 Illness, unspecified: Secondary | ICD-10-CM | POA: Diagnosis not present

## 2020-05-13 ENCOUNTER — Other Ambulatory Visit: Payer: Self-pay

## 2020-05-13 ENCOUNTER — Encounter: Payer: Self-pay | Admitting: Endocrinology

## 2020-05-13 ENCOUNTER — Ambulatory Visit: Payer: Medicare HMO | Admitting: Endocrinology

## 2020-05-13 VITALS — BP 165/80 | HR 53 | Ht 68.0 in | Wt 168.6 lb

## 2020-05-13 DIAGNOSIS — E1142 Type 2 diabetes mellitus with diabetic polyneuropathy: Secondary | ICD-10-CM

## 2020-05-13 DIAGNOSIS — I10 Essential (primary) hypertension: Secondary | ICD-10-CM

## 2020-05-13 DIAGNOSIS — E782 Mixed hyperlipidemia: Secondary | ICD-10-CM | POA: Diagnosis not present

## 2020-05-13 LAB — COMPREHENSIVE METABOLIC PANEL
ALT: 9 U/L (ref 0–53)
AST: 12 U/L (ref 0–37)
Albumin: 4.4 g/dL (ref 3.5–5.2)
Alkaline Phosphatase: 48 U/L (ref 39–117)
BUN: 27 mg/dL — ABNORMAL HIGH (ref 6–23)
CO2: 27 mEq/L (ref 19–32)
Calcium: 9.8 mg/dL (ref 8.4–10.5)
Chloride: 105 mEq/L (ref 96–112)
Creatinine, Ser: 1.44 mg/dL (ref 0.40–1.50)
GFR: 45.79 mL/min — ABNORMAL LOW (ref >60.00)
Glucose, Bld: 100 mg/dL — ABNORMAL HIGH (ref 70–99)
Potassium: 4.2 mEq/L (ref 3.5–5.1)
Sodium: 140 mEq/L (ref 135–145)
Total Bilirubin: 0.6 mg/dL (ref 0.2–1.2)
Total Protein: 6.8 g/dL (ref 6.0–8.3)

## 2020-05-13 LAB — LIPID PANEL
Cholesterol: 153 mg/dL (ref 0–200)
HDL: 84.8 mg/dL (ref >39.00)
LDL Cholesterol: 56 mg/dL (ref 0–99)
NonHDL: 68.14
Total CHOL/HDL Ratio: 2
Triglycerides: 62 mg/dL (ref 0.0–149.0)
VLDL: 12.4 mg/dL (ref 0.0–40.0)

## 2020-05-13 LAB — CBC
HCT: 32.9 % — ABNORMAL LOW (ref 39.0–52.0)
Hemoglobin: 11 g/dL — ABNORMAL LOW (ref 13.0–17.0)
MCHC: 33.5 g/dL (ref 30.0–36.0)
MCV: 95.5 fl (ref 78.0–100.0)
Platelets: 178 10*3/uL (ref 150.0–400.0)
RBC: 3.44 Mil/uL — ABNORMAL LOW (ref 4.22–5.81)
RDW: 14 % (ref 11.5–15.5)
WBC: 6.8 10*3/uL (ref 4.0–10.5)

## 2020-05-13 LAB — TSH: TSH: 1.62 u[IU]/mL (ref 0.35–4.50)

## 2020-05-13 MED ORDER — AMLODIPINE BESYLATE 5 MG PO TABS: 5.0000 mg | ORAL_TABLET | Freq: Every day | ORAL | 0 refills | Status: DC

## 2020-05-13 MED ORDER — ONETOUCH ULTRA VI STRP
ORAL_STRIP | 3 refills | Status: DC
Start: 2020-05-13 — End: 2022-06-14

## 2020-05-13 NOTE — Patient Instructions (Signed)
Check blood sugars on waking up 3 days a week  Also check blood sugars about 2 hours after meals and do this after different meals by rotation  Recommended blood sugar levels on waking up are 90-130 and about 2 hours after meal is 130-160  Please bring your blood sugar monitor to each visit, thank you  Stay on Losartan, add amlodipine

## 2020-05-13 NOTE — Addendum Note (Signed)
Addended by: Kaylyn Lim I on: 05/13/2020 01:54 PM   Modules accepted: Orders

## 2020-05-13 NOTE — Progress Notes (Signed)
Patient ID: Jason Mcpherson, male   DOB: [birth year], 80 y.o.   MRN: [id number]    Reason for Appointment: follow-up    History of Present Illness:          Diagnosis: Type 2 diabetes mellitus, date of diagnosis: 1997       Past history: The patient is unclear about his symptoms at diagnosis as well as initial treatment. Most likely has been on metformin for sometime and at some point was also given glipizide. He is also very unclear about when he was started on insulin and records from his previous endocrinologist are not available; may have been taking this over the last 15 years. Also not clear if he had been on different types of insulin However he was previously significantly overweight His A1c in 9/14 was 5.9 and in 2015 had been about 6.9-7 On his initial consultation he was taking metformin and glipizide ER; he was taking Levemir irregularly based on his blood sugar level Levemir was stopped on his initial consultation in 6/15 He was started on Januvia since 10/29/13 after stopping his Levemir insulin  His A1c in 6/17 had gone up to 7.5 and was started back on Levemir  Recent history:           Oral hypoglycemic drugs the patient is taking are: Metformin 2000 mg daily, Prandin 1.0 mg twice daily   Current management and blood sugar analysis:  His A1c is pending Previously lower than expected at 5.8, previously 5.4   He is having excellent blood sugars overall although relatively higher compared to his last visit  As before he takes metformin twice a day and tolerating 1 g twice daily  Although he was reminded to check his sugars after meals he is only doing readings before breakfast and after dinner around 6-7 PM  No further tendency to low sugars, previously was as low as 64  He does not think he has changed his diet  He is regular with taking Prandin 1 mg before breakfast and dinnertime  Is not able to do any walking for exercise  FASTING blood sugars  are mildly increased but relatively variable  He is usually trying to take Prandin before starting the weight as directed  Blood sugars after dinner are recently fairly good although higher in late December  Recently running out of test trips  His last renal function had been near normal and metformin was continued unchanged  His weight is about the same   Side effects from medications have been: None  Glucose monitoring:  done less than once a day with One Touch monitor  Blood Glucose readings and averages from meter download for the last 4 weeks:   PRE-MEAL Fasting Lunch Dinner Bedtime Overall  Glucose range:  110-149     71-240  Mean/median:  130     137   POST-MEAL PC Breakfast PC Lunch PC Dinner  Glucose range:    75-240  Mean/median:    144   previously:  PRE-MEAL Fasting Lunch Dinner Bedtime Overall  Glucose range:  120-146      Mean/median:  129     128   POST-MEAL PC Breakfast PC Lunch PC Dinner  Glucose range:    64-189  Mean/median:    126     Glycemic control:   Lab Results  Component Value Date   HGBA1C 5.8 (A) 01/15/2020   HGBA1C 5.4 10/16/2019   HGBA1C 7.0 (A) 06/19/2019   Lab  Results  Component Value Date   MICROALBUR 18.2 (H) 10/16/2019   LDLCALC 61 06/19/2019   CREATININE 1.34 01/15/2020    Self-care: The diet that the patient has been following is: tries to limit fats and sweets  He is sometimes eating biscuit..  . Usually eating a sandwich at lunch and meat and salad with beans at dinner.  Eating out about twice a week Usually has low-fat low su3gar snacks  Supper at 4:30-5 PM   Dietician visit:  years ago.            Weight history: Highest weight in the past has been 220 and lowest 157  Wt Readings from Last 3 Encounters:  05/13/20 168 lb 9.6 oz (76.5 kg)  01/15/20 169 lb (76.7 kg)  10/16/19 170 lb 9.6 oz (77.4 kg)    OTHER problems discussed in review of systems    Allergies as of 05/13/2020   No Known Allergies      Medication List       Accurate as of May 13, 2020  1:16 PM. If you have any questions, ask your nurse or doctor.        amLODipine 5 MG tablet Commonly known as: NORVASC Take 1 tablet (5 mg total) by mouth daily. Started by: Elayne Snare, MD   aspirin 81 MG chewable tablet Chew 81 mg by mouth daily.   betamethasone dipropionate 0.05 % cream Apply topically 2 (two) times daily.   furosemide 20 MG tablet Commonly known as: LASIX Take one 20mg  tablet 3 times weekly for pedal edema   losartan-hydrochlorothiazide 50-12.5 MG tablet Commonly known as: HYZAAR Take 1 tablet by mouth daily.   metFORMIN 1000 MG tablet Commonly known as: GLUCOPHAGE 1 tab in am and 1/2 in pm   mupirocin ointment 2 % Commonly known as: BACTROBAN Apply 1 application topically 2 (two) times daily.   OneTouch Delica Lancets Fine Misc USED TO CHECK SUGAR 2X DAILY.   OneTouch Delica Plus [id number] Misc USED TO CHECK SUGAR 2X DAILY.   OneTouch Ultra test strip Generic drug: glucose blood USE AS DIRECTED TO TEST TWICE DAILY   repaglinide 1 MG tablet Commonly known as: PRANDIN Take 1 tablet (1 mg total) by mouth 2 (two) times daily before a meal.   simvastatin 20 MG tablet Commonly known as: ZOCOR TAKE 1 TABLET BY MOUTH EVERY DAY IN THE EVENING       Allergies: No Known Allergies  Past Medical History:  Diagnosis Date  . Anemia   . Arthritis    KNEES & BACK  . Cellulitis 08/2013   RT LEG  . Chronic kidney disease 08/2013   ACUTE RENAL   . Dehydration   . Diabetes mellitus without complication ([place])   . Hyperkalemia 08/2013  . Hyperlipemia   . Hypertension     Past Surgical History:  Procedure Laterality Date  . ANTERIOR CERVICAL DECOMP/DISCECTOMY FUSION N/A 10/17/2012   Procedure: ANTERIOR CERVICAL DECOMPRESSION/DISCECTOMY FUSION 2 LEVELS;  Surgeon: Charlie Pitter, MD;  Location: [place] [facility];  Service: Neurosurgery;  Laterality: N/A;  Cervical three-four, four-five anterior  cervical decompression fusion with allograft plating  . BACK SURGERY     10 years ago  . CERVICAL DISC SURGERY      Family History  Problem Relation Age of Onset  . Cancer - Colon Mother   . Cancer - Prostate Father     Social History:  reports that he quit smoking about 22 years ago. He has never used  smokeless tobacco. He reports that he does not drink alcohol and does not use drugs.    Review of Systems    RENAL dysfunction: He has not been evaluated by nephrology Etiology unclear  Microalbumin normal  Renal function history:  Lab Results  Component Value Date   CREATININE 1.34 01/15/2020   CREATININE 1.33 10/16/2019   CREATININE 1.61 (H) 07/24/2019   CREATININE 1.67 (H) 06/19/2019         Lipids: treated with Zocor 20 mg, last lipids as follows:       Lab Results  Component Value Date   CHOL 154 06/19/2019   HDL 75.00 06/19/2019   LDLCALC 61 06/19/2019   LDLDIRECT 48.9 02/21/2014   TRIG 88.0 06/19/2019   CHOLHDL 2 06/19/2019    HYPERTENSION: Has been treated with losartan HCT Blood pressure has been previously controlled Not clear why blood pressure is high today, checked twice He has not followed up with his PCP as directed  BP Readings from Last 3 Encounters:  05/13/20 (!) 165/80  01/15/20 (!) 142/70  10/16/19 140/70    He is on Lasix for pedal edema and taking this 3 days a week, does not like excessive diuresis with this  He is having more difficulties with balance and he thinks this is partly related to his knee pain, using walker recently  History of anemia: He has not followed up with his PCP  Physical Examination:  BP (!) 165/80   Pulse (!) 53   Ht 5\' 8"  (1.727 m)   Wt 168 lb 9.6 oz (76.5 kg)   SpO2 (!) 73%   BMI 25.64 kg/m   Foot exam is unremarkable with no changes and no active ulcers No significant edema  ASSESSMENT:   Diabetes type 2, BMI 26   See history of present illness for detailed discussion of current diabetes  management, blood sugar patterns and problems identified  His A1c is usually near normal  He is taking Prandin twice daily regularly along with 1 g metformin twice daily Blood sugars at home are excellent Considering his age is average blood sugar at home is excellent at 137 However not checking blood sugars after breakfast or lunch No recent hypoglycemia with low-dose Prandin Usually not able to afford brand-name medications   Hypertension: His blood pressure is appearing poorly controlled and he does not have a reliable monitor at home  Peripheral neuropathy: Foot exam on inspection is unremarkable  History of edema: This is well controlled with Lasix 3 times a week   PLAN:  Check blood sugars after breakfast alternating with fasting May check some readings in the afternoons also To call if having any hypoglycemia Also as before he can skip the Prandin if not eating any carbohydrates with a solid meal Avoid high-fat foods especially if blood sugars are higher after breakfast with eating biscuits  Start AMLODIPINE 5 mg daily for better blood pressure control He was advised to come back in a month for blood pressure check but he does not want to come back until early spring  Follow-up with PCP regarding balance issues, knee pain and other general problems   Patient Instructions  Check blood sugars on waking up 3 days a week  Also check blood sugars about 2 hours after meals and do this after different meals by rotation  Recommended blood sugar levels on waking up are 90-130 and about 2 hours after meal is 130-160  Please bring your blood sugar monitor to each visit, thank  you  Stay on Losartan, add amlodipine       Elayne Snare 05/13/2020, 1:16 PM   Note: This office note was prepared with Dragon voice recognition system technology. Any transcriptional errors that result from this process are unintentional.

## 2020-05-14 LAB — HEMOGLOBIN A1C: Hgb A1c MFr Bld: 5.8 % (ref 4.6–6.5)

## 2020-05-14 NOTE — Progress Notes (Signed)
Please let him know that kidney test is slightly worse and he has mild anemia.  Needs to follow-up with his PCP.  Cholesterol okay

## 2020-06-06 ENCOUNTER — Other Ambulatory Visit: Payer: Self-pay | Admitting: Endocrinology

## 2020-07-05 ENCOUNTER — Other Ambulatory Visit: Payer: Self-pay | Admitting: Endocrinology

## 2020-07-08 ENCOUNTER — Other Ambulatory Visit: Payer: Self-pay

## 2020-07-08 ENCOUNTER — Encounter: Payer: Self-pay | Admitting: Endocrinology

## 2020-07-08 ENCOUNTER — Ambulatory Visit: Payer: Medicare HMO | Admitting: Endocrinology

## 2020-07-08 VITALS — BP 140/65 | HR 72 | Ht 66.0 in | Wt 155.0 lb

## 2020-07-08 DIAGNOSIS — N182 Chronic kidney disease, stage 2 (mild): Secondary | ICD-10-CM

## 2020-07-08 DIAGNOSIS — I10 Essential (primary) hypertension: Secondary | ICD-10-CM | POA: Diagnosis not present

## 2020-07-08 DIAGNOSIS — Z794 Long term (current) use of insulin: Secondary | ICD-10-CM | POA: Diagnosis not present

## 2020-07-08 DIAGNOSIS — E1165 Type 2 diabetes mellitus with hyperglycemia: Secondary | ICD-10-CM | POA: Diagnosis not present

## 2020-07-08 DIAGNOSIS — R634 Abnormal weight loss: Secondary | ICD-10-CM

## 2020-07-08 LAB — COMPREHENSIVE METABOLIC PANEL
ALT: 10 U/L (ref 0–53)
AST: 12 U/L (ref 0–37)
Albumin: 4.4 g/dL (ref 3.5–5.2)
Alkaline Phosphatase: 49 U/L (ref 39–117)
BUN: 45 mg/dL — ABNORMAL HIGH (ref 6–23)
CO2: 26 mEq/L (ref 19–32)
Calcium: 10.1 mg/dL (ref 8.4–10.5)
Chloride: 109 mEq/L (ref 96–112)
Creatinine, Ser: 1.54 mg/dL — ABNORMAL HIGH (ref 0.40–1.50)
GFR: 42.2 mL/min — ABNORMAL LOW (ref >60.00)
Glucose, Bld: 68 mg/dL — ABNORMAL LOW (ref 70–99)
Potassium: 4.4 mEq/L (ref 3.5–5.1)
Sodium: 145 mEq/L (ref 135–145)
Total Bilirubin: 0.5 mg/dL (ref 0.2–1.2)
Total Protein: 6.9 g/dL (ref 6.0–8.3)

## 2020-07-08 LAB — CBC WITH DIFFERENTIAL/PLATELET
Basophils Absolute: 0.1 10*3/uL (ref 0.0–0.1)
Basophils Relative: 1.9 % (ref 0.0–3.0)
Eosinophils Absolute: 0.2 10*3/uL (ref 0.0–0.7)
Eosinophils Relative: 4.5 % (ref 0.0–5.0)
HCT: 32 % — ABNORMAL LOW (ref 39.0–52.0)
Hemoglobin: 10.7 g/dL — ABNORMAL LOW (ref 13.0–17.0)
Lymphocytes Relative: 13.7 % (ref 12.0–46.0)
Lymphs Abs: 0.7 10*3/uL (ref 0.7–4.0)
MCHC: 33.5 g/dL (ref 30.0–36.0)
MCV: 94.5 fl (ref 78.0–100.0)
Monocytes Absolute: 0.6 10*3/uL (ref 0.1–1.0)
Monocytes Relative: 10.6 % (ref 3.0–12.0)
Neutro Abs: 3.7 10*3/uL (ref 1.4–7.7)
Neutrophils Relative %: 69.3 % (ref 43.0–77.0)
Platelets: 200 10*3/uL (ref 150.0–400.0)
RBC: 3.39 Mil/uL — ABNORMAL LOW (ref 4.22–5.81)
RDW: 14.2 % (ref 11.5–15.5)
WBC: 5.4 10*3/uL (ref 4.0–10.5)

## 2020-07-08 MED ORDER — REPAGLINIDE 0.5 MG PO TABS: 0.5000 mg | ORAL_TABLET | Freq: Two times a day (BID) | ORAL | 1 refills | Status: DC

## 2020-07-08 NOTE — Patient Instructions (Signed)
Check blood sugars on waking up 3 days a week  Also check blood sugars about 2 hours after meals and do this after different meals by rotation  Recommended blood sugar levels on waking up are 90-130 and about 2 hours after meal is 130-160  Please bring your blood sugar monitor to each visit, thank you   

## 2020-07-08 NOTE — Progress Notes (Signed)
Patient ID: Jason Mcpherson, male   DOB: 1941/02/16, 80 y.o.   MRN: [id number]    Reason for Appointment: follow-up    History of Present Illness:          DIABETES:  Diagnosis: Type 2 diabetes mellitus, date of diagnosis: 1997       Past history: The patient is unclear about his symptoms at diagnosis as well as initial treatment. Most likely has been on metformin for sometime and at some point was also given glipizide. He is also very unclear about when he was started on insulin and records from his previous endocrinologist are not available; may have been taking this over the last 15 years. Also not clear if he had been on different types of insulin However he was previously significantly overweight His A1c in 9/14 was 5.9 and in 2015 had been about 6.9-7 On his initial consultation he was taking metformin and glipizide ER; he was taking Levemir irregularly based on his blood sugar level Levemir was stopped on his initial consultation in 6/15 He was started on Januvia since 10/29/13 after stopping his Levemir insulin  His A1c in 6/17 had gone up to 7.5 and was started back on Levemir  Recent history:           Oral hypoglycemic drugs the patient is taking are: Metformin 2000 mg daily, Prandin 1.0 mg twice daily   Current management and blood sugar analysis:  His A1c is 5.8 as of 1/22   He is having somewhat lower blood sugars compared to his last visit  Also has lost 13 pounds and he does not know why  He thinks he is eating 2 meals a day as before and has not changed his diet  However especially after dinner and blood sugars are not as high  Most of his blood sugars are being checked fasting and probably couple of hours after dinner but his meter has the wrong time programmed on it now  FASTING blood sugars are lower than before  He has had a couple of low sugars after dinner nightly when he was eating smaller portions  He is not able to do much physical  activity   Side effects from medications have been: None  Glucose monitoring:  done less than once a day with One Touch monitor  Blood Glucose readings and averages from meter download for the last 4 weeks:   PRE-MEAL Fasting Lunch Dinner Bedtime Overall  Glucose range:  92-141     56-194  Mean/median:  112     113   POST-MEAL PC Breakfast PC Lunch PC Dinner  Glucose range:    56-194  Mean/median:    114   Prior   PRE-MEAL Fasting Lunch Dinner Bedtime Overall  Glucose range:  110-149     71-240  Mean/median:  130     137   POST-MEAL PC Breakfast PC Lunch PC Dinner  Glucose range:    75-240  Mean/median:    144     Glycemic control:   Lab Results  Component Value Date   HGBA1C 5.8 05/13/2020   HGBA1C 5.8 (A) 01/15/2020   HGBA1C 5.4 10/16/2019   Lab Results  Component Value Date   MICROALBUR 18.2 (H) 10/16/2019   LDLCALC 56 05/13/2020   CREATININE 1.44 05/13/2020    Self-care: The diet that the patient has been following is: tries to limit fats and sweets  He is sometimes eating biscuit..  . Usually eating  a sandwich at lunch and meat and salad with beans at dinner.  Eating out about twice a week Usually has low-fat low su3gar snacks  Supper at 4:30-5 PM   Dietician visit:  years ago.            Weight history: Highest weight in the past has been 220 and lowest 157  Wt Readings from Last 3 Encounters:  07/08/20 155 lb (70.3 kg)  05/13/20 168 lb 9.6 oz (76.5 kg)  01/15/20 169 lb (76.7 kg)    OTHER problems discussed in review of systems    Allergies as of 07/08/2020   No Known Allergies     Medication List       Accurate as of July 08, 2020 11:06 AM. If you have any questions, ask your nurse or doctor.        STOP taking these medications   furosemide 20 MG tablet Commonly known as: LASIX Stopped by: Elayne Snare, MD   mupirocin ointment 2 % Commonly known as: BACTROBAN Stopped by: Elayne Snare, MD     TAKE these medications    amLODipine 5 MG tablet Commonly known as: NORVASC TAKE 1 TABLET (5 MG TOTAL) BY MOUTH DAILY.   aspirin 81 MG chewable tablet Chew 81 mg by mouth daily.   betamethasone dipropionate 0.05 % cream Apply topically 2 (two) times daily.   losartan-hydrochlorothiazide 50-12.5 MG tablet Commonly known as: HYZAAR Take 1 tablet by mouth daily.   metFORMIN 1000 MG tablet Commonly known as: GLUCOPHAGE 1 tab in am and 1/2 in pm   OneTouch Delica Lancets Fine Misc USED TO CHECK SUGAR 2X DAILY.   OneTouch Delica Plus 0000000 Misc USED TO CHECK SUGAR 2X DAILY.   OneTouch Ultra test strip Generic drug: glucose blood USE AS DIRECTED TO TEST TWICE DAILY   repaglinide 0.5 MG tablet Commonly known as: PRANDIN Take 1 tablet (0.5 mg total) by mouth 2 (two) times daily before a meal. What changed:   medication strength  how much to take Changed by: Elayne Snare, MD   simvastatin 20 MG tablet Commonly known as: ZOCOR TAKE 1 TABLET BY MOUTH EVERY DAY IN THE EVENING       Allergies: No Known Allergies  Past Medical History:  Diagnosis Date  . Anemia   . Arthritis    KNEES & BACK  . Cellulitis 08/2013   RT LEG  . Chronic kidney disease 08/2013   ACUTE RENAL   . Dehydration   . Diabetes mellitus without complication ([place])   . Hyperkalemia 08/2013  . Hyperlipemia   . Hypertension     Past Surgical History:  Procedure Laterality Date  . ANTERIOR CERVICAL DECOMP/DISCECTOMY FUSION N/A 10/17/2012   Procedure: ANTERIOR CERVICAL DECOMPRESSION/DISCECTOMY FUSION 2 LEVELS;  Surgeon: Charlie Pitter, MD;  Location: [facility];  Service: Neurosurgery;  Laterality: N/A;  Cervical three-four, four-five anterior cervical decompression fusion with allograft plating  . BACK SURGERY     10 years ago  . CERVICAL DISC SURGERY      Family History  Problem Relation Age of Onset  . Cancer - Colon Mother   . Cancer - Prostate Father     Social History:  reports that he quit smoking about 22  years ago. He has never used smokeless tobacco. He reports that he does not drink alcohol and does not use drugs.    Review of Systems    RENAL dysfunction: This is somewhat variable He has not been evaluated by a  nephrologist Etiology unclear  Microalbumin normal  Renal function history:  Lab Results  Component Value Date   CREATININE 1.44 05/13/2020   CREATININE 1.34 01/15/2020   CREATININE 1.33 10/16/2019   CREATININE 1.61 (H) 07/24/2019         Lipids: treated with Zocor 20 mg, last lipids as follows:       Lab Results  Component Value Date   CHOL 153 05/13/2020   HDL 84.80 05/13/2020   LDLCALC 56 05/13/2020   LDLDIRECT 48.9 02/21/2014   TRIG 62.0 05/13/2020   CHOLHDL 2 05/13/2020    HYPERTENSION: Has been treated with losartan HCT and now amlodipine He does not check blood pressure at home Because of significant high blood pressure in 1/22 he was told to start amlodipine also  He has not followed up with his PCP as directed  BP Readings from Last 3 Encounters:  07/08/20 140/65  05/13/20 (!) 165/80  01/15/20 (!) 142/70    He is on Lasix for pedal edema and he does not think he has taken this lately   History of anemia: He has not followed up with his PCP  Lab Results  Component Value Date   HGB 11.0 (L) 05/13/2020     Physical Examination:  BP 140/65 (Patient Position: Standing)   Pulse 72   Ht '5\' 6"'$  (1.676 m)   Wt 155 lb (70.3 kg)   SpO2 99%   BMI 25.02 kg/m   No lower leg edema Has 1+ ankle edema Skin on feet is intact without ulceration   ASSESSMENT:   Diabetes type 2, BMI 26   See history of present illness for detailed discussion of current diabetes management, blood sugar patterns and problems identified  His A1c is usually lower than expected  He is taking Prandin 1 mg twice daily regularly along with 1 g metformin twice daily Blood sugars at home are practically normal and also has a couple of low sugars after  dinner With his weight loss likely he is not eating as much but difficult to get an accurate history Again he checks blood sugars only in a limited fashion in the morning and after dinner and not wearing the timing as discussed   Hypertension: His blood pressure is better controlled with adding amlodipine Not able to monitor at home  Unexpected weight loss: Advised him to have evaluation with his PCP but not clear if he will make an appointment  History of anemia: We will follow up   PLAN:  Check blood sugars by rotation at different times and instructions given Reduce Prandin to 0.5 mg Call if blood sugars not consistently controlled No change in blood pressure medication  Recommended chest x-ray because of his weight loss but he refuses as he says he does not have time  Follow-up for diabetes in 3 months  Follow-up with PCP regarding weight loss evaluation   Patient Instructions  Check blood sugars on waking up 3 days a week  Also check blood sugars about 2 hours after meals and do this after different meals by rotation  Recommended blood sugar levels on waking up are 90-130 and about 2 hours after meal is 130-160  Please bring your blood sugar monitor to each visit, thank you         Elayne Snare 07/08/2020, 11:06 AM   Note: This office note was prepared with Dragon voice recognition system technology. Any transcriptional errors that result from this process are unintentional.

## 2020-07-09 ENCOUNTER — Telehealth: Payer: Self-pay | Admitting: Endocrinology

## 2020-07-09 ENCOUNTER — Other Ambulatory Visit: Payer: Self-pay | Admitting: Endocrinology

## 2020-07-09 NOTE — Telephone Encounter (Signed)
Pt called back to receive his lab results. Pt says to call him back on his house phone at [phone]

## 2020-07-14 NOTE — Telephone Encounter (Signed)
Already notified pt with the lab results and instructions.

## 2020-07-16 ENCOUNTER — Other Ambulatory Visit: Payer: Self-pay | Admitting: *Deleted

## 2020-07-16 ENCOUNTER — Telehealth: Payer: Self-pay | Admitting: Endocrinology

## 2020-07-16 DIAGNOSIS — Z794 Long term (current) use of insulin: Secondary | ICD-10-CM

## 2020-07-16 DIAGNOSIS — E1165 Type 2 diabetes mellitus with hyperglycemia: Secondary | ICD-10-CM

## 2020-07-16 MED ORDER — LOSARTAN POTASSIUM-HCTZ 50-12.5 MG PO TABS: 1.0000 | ORAL_TABLET | Freq: Every day | ORAL | 1 refills | Status: DC

## 2020-07-16 NOTE — Telephone Encounter (Signed)
Sent Rx to the pharmacy.

## 2020-07-16 NOTE — Telephone Encounter (Signed)
MEDICATION: fluid pills (Furosemide)  PHARMACY:   [id number]- [place], [place] - [address] Phone:  [phone] Fax:  [phone]     HAS THE PATIENT CONTACTED THEIR PHARMACY?  no  IS THIS A 90 DAY SUPPLY : yes  IS PATIENT OUT OF MEDICATION: yes  IF NOT; HOW MUCH IS LEFT:   LAST APPOINTMENT DATE: '@3'$ /01/2021  NEXT APPOINTMENT DATE:'@6'$ /14/2022  DO WE HAVE YOUR PERMISSION TO LEAVE A DETAILED MESSAGE?:  OTHER COMMENTS:    **Let patient know to contact pharmacy at the end of the day to make sure medication is ready. **  ** Please notify patient to allow 48-72 hours to process**  **Encourage patient to contact the pharmacy for refills or they can request refills through [facility]*

## 2020-08-06 ENCOUNTER — Other Ambulatory Visit: Payer: Self-pay | Admitting: Endocrinology

## 2020-08-15 DIAGNOSIS — M25461 Effusion, right knee: Secondary | ICD-10-CM | POA: Diagnosis not present

## 2020-08-15 DIAGNOSIS — M1711 Unilateral primary osteoarthritis, right knee: Secondary | ICD-10-CM | POA: Diagnosis not present

## 2020-08-15 DIAGNOSIS — G8929 Other chronic pain: Secondary | ICD-10-CM | POA: Diagnosis not present

## 2020-08-15 DIAGNOSIS — M25561 Pain in right knee: Secondary | ICD-10-CM | POA: Diagnosis not present

## 2020-08-15 DIAGNOSIS — E1169 Type 2 diabetes mellitus with other specified complication: Secondary | ICD-10-CM | POA: Diagnosis not present

## 2020-08-15 DIAGNOSIS — I10 Essential (primary) hypertension: Secondary | ICD-10-CM | POA: Diagnosis not present

## 2020-08-15 DIAGNOSIS — I872 Venous insufficiency (chronic) (peripheral): Secondary | ICD-10-CM | POA: Diagnosis not present

## 2020-08-15 DIAGNOSIS — E119 Type 2 diabetes mellitus without complications: Secondary | ICD-10-CM | POA: Diagnosis not present

## 2020-08-15 DIAGNOSIS — I708 Atherosclerosis of other arteries: Secondary | ICD-10-CM | POA: Diagnosis not present

## 2020-08-15 DIAGNOSIS — E785 Hyperlipidemia, unspecified: Secondary | ICD-10-CM | POA: Diagnosis not present

## 2020-09-09 DIAGNOSIS — Z20822 Contact with and (suspected) exposure to covid-19: Secondary | ICD-10-CM | POA: Diagnosis not present

## 2020-09-09 DIAGNOSIS — E785 Hyperlipidemia, unspecified: Secondary | ICD-10-CM | POA: Diagnosis not present

## 2020-09-09 DIAGNOSIS — W231XXA Caught, crushed, jammed, or pinched between stationary objects, initial encounter: Secondary | ICD-10-CM | POA: Diagnosis not present

## 2020-09-09 DIAGNOSIS — Z66 Do not resuscitate: Secondary | ICD-10-CM | POA: Diagnosis not present

## 2020-09-09 DIAGNOSIS — E1169 Type 2 diabetes mellitus with other specified complication: Secondary | ICD-10-CM | POA: Diagnosis not present

## 2020-09-09 DIAGNOSIS — S99922A Unspecified injury of left foot, initial encounter: Secondary | ICD-10-CM | POA: Diagnosis not present

## 2020-09-09 DIAGNOSIS — I96 Gangrene, not elsewhere classified: Secondary | ICD-10-CM | POA: Diagnosis not present

## 2020-09-09 DIAGNOSIS — E1122 Type 2 diabetes mellitus with diabetic chronic kidney disease: Secondary | ICD-10-CM | POA: Diagnosis not present

## 2020-09-09 DIAGNOSIS — L97524 Non-pressure chronic ulcer of other part of left foot with necrosis of bone: Secondary | ICD-10-CM | POA: Diagnosis not present

## 2020-09-09 DIAGNOSIS — Z789 Other specified health status: Secondary | ICD-10-CM | POA: Diagnosis not present

## 2020-09-09 DIAGNOSIS — L03032 Cellulitis of left toe: Secondary | ICD-10-CM | POA: Diagnosis not present

## 2020-09-09 DIAGNOSIS — M86172 Other acute osteomyelitis, left ankle and foot: Secondary | ICD-10-CM | POA: Diagnosis not present

## 2020-09-09 DIAGNOSIS — A4901 Methicillin susceptible Staphylococcus aureus infection, unspecified site: Secondary | ICD-10-CM | POA: Diagnosis not present

## 2020-09-09 DIAGNOSIS — M868X7 Other osteomyelitis, ankle and foot: Secondary | ICD-10-CM | POA: Diagnosis not present

## 2020-09-09 DIAGNOSIS — M869 Osteomyelitis, unspecified: Secondary | ICD-10-CM | POA: Diagnosis not present

## 2020-09-09 DIAGNOSIS — E11628 Type 2 diabetes mellitus with other skin complications: Secondary | ICD-10-CM | POA: Diagnosis not present

## 2020-09-09 DIAGNOSIS — L089 Local infection of the skin and subcutaneous tissue, unspecified: Secondary | ICD-10-CM | POA: Diagnosis not present

## 2020-09-09 DIAGNOSIS — D649 Anemia, unspecified: Secondary | ICD-10-CM | POA: Diagnosis not present

## 2020-09-09 DIAGNOSIS — I129 Hypertensive chronic kidney disease with stage 1 through stage 4 chronic kidney disease, or unspecified chronic kidney disease: Secondary | ICD-10-CM | POA: Diagnosis not present

## 2020-09-09 DIAGNOSIS — N183 Chronic kidney disease, stage 3 unspecified: Secondary | ICD-10-CM | POA: Diagnosis not present

## 2020-09-09 DIAGNOSIS — E1152 Type 2 diabetes mellitus with diabetic peripheral angiopathy with gangrene: Secondary | ICD-10-CM | POA: Diagnosis not present

## 2020-09-09 DIAGNOSIS — M79675 Pain in left toe(s): Secondary | ICD-10-CM | POA: Diagnosis not present

## 2020-09-09 DIAGNOSIS — L03116 Cellulitis of left lower limb: Secondary | ICD-10-CM | POA: Diagnosis not present

## 2020-09-09 DIAGNOSIS — M898X7 Other specified disorders of bone, ankle and foot: Secondary | ICD-10-CM | POA: Diagnosis not present

## 2020-09-09 DIAGNOSIS — Z89422 Acquired absence of other left toe(s): Secondary | ICD-10-CM | POA: Diagnosis not present

## 2020-09-13 DIAGNOSIS — Z789 Other specified health status: Secondary | ICD-10-CM | POA: Diagnosis not present

## 2020-09-13 DIAGNOSIS — M869 Osteomyelitis, unspecified: Secondary | ICD-10-CM | POA: Diagnosis not present

## 2020-09-13 DIAGNOSIS — Z22322 Carrier or suspected carrier of Methicillin resistant Staphylococcus aureus: Secondary | ICD-10-CM | POA: Diagnosis not present

## 2020-09-13 DIAGNOSIS — Z89422 Acquired absence of other left toe(s): Secondary | ICD-10-CM | POA: Diagnosis not present

## 2020-09-13 DIAGNOSIS — E785 Hyperlipidemia, unspecified: Secondary | ICD-10-CM | POA: Diagnosis not present

## 2020-09-13 DIAGNOSIS — L03116 Cellulitis of left lower limb: Secondary | ICD-10-CM | POA: Diagnosis not present

## 2020-09-13 DIAGNOSIS — B9561 Methicillin susceptible Staphylococcus aureus infection as the cause of diseases classified elsewhere: Secondary | ICD-10-CM | POA: Diagnosis not present

## 2020-09-13 DIAGNOSIS — L03032 Cellulitis of left toe: Secondary | ICD-10-CM | POA: Diagnosis not present

## 2020-09-13 DIAGNOSIS — I129 Hypertensive chronic kidney disease with stage 1 through stage 4 chronic kidney disease, or unspecified chronic kidney disease: Secondary | ICD-10-CM | POA: Diagnosis not present

## 2020-09-13 DIAGNOSIS — N183 Chronic kidney disease, stage 3 unspecified: Secondary | ICD-10-CM | POA: Diagnosis not present

## 2020-09-13 DIAGNOSIS — R5381 Other malaise: Secondary | ICD-10-CM | POA: Diagnosis not present

## 2020-09-13 DIAGNOSIS — Z7982 Long term (current) use of aspirin: Secondary | ICD-10-CM | POA: Diagnosis not present

## 2020-09-13 DIAGNOSIS — E119 Type 2 diabetes mellitus without complications: Secondary | ICD-10-CM | POA: Diagnosis not present

## 2020-09-13 DIAGNOSIS — H9193 Unspecified hearing loss, bilateral: Secondary | ICD-10-CM | POA: Diagnosis not present

## 2020-09-13 DIAGNOSIS — E1122 Type 2 diabetes mellitus with diabetic chronic kidney disease: Secondary | ICD-10-CM | POA: Diagnosis not present

## 2020-09-13 DIAGNOSIS — Z4781 Encounter for orthopedic aftercare following surgical amputation: Secondary | ICD-10-CM | POA: Diagnosis not present

## 2020-09-15 DIAGNOSIS — L03116 Cellulitis of left lower limb: Secondary | ICD-10-CM | POA: Diagnosis not present

## 2020-09-15 DIAGNOSIS — R5381 Other malaise: Secondary | ICD-10-CM | POA: Diagnosis not present

## 2020-09-15 DIAGNOSIS — M869 Osteomyelitis, unspecified: Secondary | ICD-10-CM | POA: Diagnosis not present

## 2020-09-16 DIAGNOSIS — E1122 Type 2 diabetes mellitus with diabetic chronic kidney disease: Secondary | ICD-10-CM | POA: Diagnosis not present

## 2020-09-16 DIAGNOSIS — E785 Hyperlipidemia, unspecified: Secondary | ICD-10-CM | POA: Diagnosis not present

## 2020-09-16 DIAGNOSIS — R5381 Other malaise: Secondary | ICD-10-CM | POA: Diagnosis not present

## 2020-09-16 DIAGNOSIS — N183 Chronic kidney disease, stage 3 unspecified: Secondary | ICD-10-CM | POA: Diagnosis not present

## 2020-09-16 DIAGNOSIS — L03116 Cellulitis of left lower limb: Secondary | ICD-10-CM | POA: Diagnosis not present

## 2020-09-16 DIAGNOSIS — I129 Hypertensive chronic kidney disease with stage 1 through stage 4 chronic kidney disease, or unspecified chronic kidney disease: Secondary | ICD-10-CM | POA: Diagnosis not present

## 2020-09-16 DIAGNOSIS — M869 Osteomyelitis, unspecified: Secondary | ICD-10-CM | POA: Diagnosis not present

## 2020-09-16 DIAGNOSIS — Z22322 Carrier or suspected carrier of Methicillin resistant Staphylococcus aureus: Secondary | ICD-10-CM | POA: Diagnosis not present

## 2020-09-25 DIAGNOSIS — N183 Chronic kidney disease, stage 3 unspecified: Secondary | ICD-10-CM | POA: Diagnosis not present

## 2020-09-25 DIAGNOSIS — Z89422 Acquired absence of other left toe(s): Secondary | ICD-10-CM | POA: Diagnosis not present

## 2020-09-25 DIAGNOSIS — E1122 Type 2 diabetes mellitus with diabetic chronic kidney disease: Secondary | ICD-10-CM | POA: Diagnosis not present

## 2020-09-25 DIAGNOSIS — I129 Hypertensive chronic kidney disease with stage 1 through stage 4 chronic kidney disease, or unspecified chronic kidney disease: Secondary | ICD-10-CM | POA: Diagnosis not present

## 2020-09-25 DIAGNOSIS — E785 Hyperlipidemia, unspecified: Secondary | ICD-10-CM | POA: Diagnosis not present

## 2020-09-25 DIAGNOSIS — E119 Type 2 diabetes mellitus without complications: Secondary | ICD-10-CM | POA: Diagnosis not present

## 2020-09-25 DIAGNOSIS — Z22322 Carrier or suspected carrier of Methicillin resistant Staphylococcus aureus: Secondary | ICD-10-CM | POA: Diagnosis not present

## 2020-09-25 DIAGNOSIS — M869 Osteomyelitis, unspecified: Secondary | ICD-10-CM | POA: Diagnosis not present

## 2020-09-25 DIAGNOSIS — L03116 Cellulitis of left lower limb: Secondary | ICD-10-CM | POA: Diagnosis not present

## 2020-09-25 DIAGNOSIS — R5381 Other malaise: Secondary | ICD-10-CM | POA: Diagnosis not present

## 2020-10-02 DIAGNOSIS — L03116 Cellulitis of left lower limb: Secondary | ICD-10-CM | POA: Diagnosis not present

## 2020-10-02 DIAGNOSIS — E785 Hyperlipidemia, unspecified: Secondary | ICD-10-CM | POA: Diagnosis not present

## 2020-10-02 DIAGNOSIS — I129 Hypertensive chronic kidney disease with stage 1 through stage 4 chronic kidney disease, or unspecified chronic kidney disease: Secondary | ICD-10-CM | POA: Diagnosis not present

## 2020-10-02 DIAGNOSIS — N183 Chronic kidney disease, stage 3 unspecified: Secondary | ICD-10-CM | POA: Diagnosis not present

## 2020-10-02 DIAGNOSIS — Z22322 Carrier or suspected carrier of Methicillin resistant Staphylococcus aureus: Secondary | ICD-10-CM | POA: Diagnosis not present

## 2020-10-02 DIAGNOSIS — M869 Osteomyelitis, unspecified: Secondary | ICD-10-CM | POA: Diagnosis not present

## 2020-10-02 DIAGNOSIS — E1122 Type 2 diabetes mellitus with diabetic chronic kidney disease: Secondary | ICD-10-CM | POA: Diagnosis not present

## 2020-10-02 DIAGNOSIS — R5381 Other malaise: Secondary | ICD-10-CM | POA: Diagnosis not present

## 2020-10-06 ENCOUNTER — Telehealth: Payer: Self-pay | Admitting: Endocrinology

## 2020-10-06 NOTE — Telephone Encounter (Signed)
Patient getting treatment from a different Endocrinologist and will not be rescheduling appointment

## 2020-10-08 DIAGNOSIS — S98131A Complete traumatic amputation of one right lesser toe, initial encounter: Secondary | ICD-10-CM | POA: Diagnosis not present

## 2020-10-08 DIAGNOSIS — Z789 Other specified health status: Secondary | ICD-10-CM | POA: Diagnosis not present

## 2020-10-08 DIAGNOSIS — E782 Mixed hyperlipidemia: Secondary | ICD-10-CM | POA: Diagnosis present

## 2020-10-08 DIAGNOSIS — E1169 Type 2 diabetes mellitus with other specified complication: Secondary | ICD-10-CM | POA: Diagnosis not present

## 2020-10-08 DIAGNOSIS — E1165 Type 2 diabetes mellitus with hyperglycemia: Secondary | ICD-10-CM | POA: Diagnosis present

## 2020-10-08 DIAGNOSIS — M7989 Other specified soft tissue disorders: Secondary | ICD-10-CM | POA: Diagnosis not present

## 2020-10-08 DIAGNOSIS — E785 Hyperlipidemia, unspecified: Secondary | ICD-10-CM | POA: Diagnosis not present

## 2020-10-08 DIAGNOSIS — N1831 Chronic kidney disease, stage 3a: Secondary | ICD-10-CM | POA: Diagnosis not present

## 2020-10-08 DIAGNOSIS — D631 Anemia in chronic kidney disease: Secondary | ICD-10-CM | POA: Diagnosis not present

## 2020-10-08 DIAGNOSIS — I129 Hypertensive chronic kidney disease with stage 1 through stage 4 chronic kidney disease, or unspecified chronic kidney disease: Secondary | ICD-10-CM | POA: Diagnosis not present

## 2020-10-08 DIAGNOSIS — E11621 Type 2 diabetes mellitus with foot ulcer: Secondary | ICD-10-CM | POA: Diagnosis not present

## 2020-10-08 DIAGNOSIS — L97509 Non-pressure chronic ulcer of other part of unspecified foot with unspecified severity: Secondary | ICD-10-CM | POA: Diagnosis not present

## 2020-10-08 DIAGNOSIS — K219 Gastro-esophageal reflux disease without esophagitis: Secondary | ICD-10-CM | POA: Diagnosis present

## 2020-10-09 DIAGNOSIS — Z89422 Acquired absence of other left toe(s): Secondary | ICD-10-CM | POA: Diagnosis not present

## 2020-10-09 DIAGNOSIS — E119 Type 2 diabetes mellitus without complications: Secondary | ICD-10-CM | POA: Diagnosis not present

## 2020-10-14 ENCOUNTER — Ambulatory Visit: Payer: Medicare HMO | Admitting: Endocrinology

## 2020-11-14 DIAGNOSIS — L03032 Cellulitis of left toe: Secondary | ICD-10-CM | POA: Diagnosis not present

## 2020-11-14 DIAGNOSIS — R600 Localized edema: Secondary | ICD-10-CM | POA: Diagnosis not present

## 2020-11-14 DIAGNOSIS — E119 Type 2 diabetes mellitus without complications: Secondary | ICD-10-CM | POA: Diagnosis not present

## 2020-11-14 DIAGNOSIS — I10 Essential (primary) hypertension: Secondary | ICD-10-CM | POA: Diagnosis not present

## 2020-11-18 DIAGNOSIS — I129 Hypertensive chronic kidney disease with stage 1 through stage 4 chronic kidney disease, or unspecified chronic kidney disease: Secondary | ICD-10-CM | POA: Diagnosis not present

## 2020-11-18 DIAGNOSIS — E1151 Type 2 diabetes mellitus with diabetic peripheral angiopathy without gangrene: Secondary | ICD-10-CM | POA: Diagnosis not present

## 2020-11-18 DIAGNOSIS — E782 Mixed hyperlipidemia: Secondary | ICD-10-CM | POA: Diagnosis not present

## 2020-11-18 DIAGNOSIS — L97509 Non-pressure chronic ulcer of other part of unspecified foot with unspecified severity: Secondary | ICD-10-CM | POA: Diagnosis not present

## 2020-11-18 DIAGNOSIS — E119 Type 2 diabetes mellitus without complications: Secondary | ICD-10-CM | POA: Diagnosis not present

## 2020-11-18 DIAGNOSIS — E1122 Type 2 diabetes mellitus with diabetic chronic kidney disease: Secondary | ICD-10-CM | POA: Diagnosis not present

## 2020-11-18 DIAGNOSIS — Z7984 Long term (current) use of oral hypoglycemic drugs: Secondary | ICD-10-CM | POA: Diagnosis not present

## 2020-11-18 DIAGNOSIS — E1142 Type 2 diabetes mellitus with diabetic polyneuropathy: Secondary | ICD-10-CM | POA: Diagnosis not present

## 2020-11-18 DIAGNOSIS — E11621 Type 2 diabetes mellitus with foot ulcer: Secondary | ICD-10-CM | POA: Diagnosis not present

## 2020-11-18 DIAGNOSIS — N1831 Chronic kidney disease, stage 3a: Secondary | ICD-10-CM | POA: Diagnosis not present

## 2020-11-26 DIAGNOSIS — L03032 Cellulitis of left toe: Secondary | ICD-10-CM | POA: Diagnosis not present

## 2020-11-26 DIAGNOSIS — I10 Essential (primary) hypertension: Secondary | ICD-10-CM | POA: Diagnosis not present

## 2021-01-13 DIAGNOSIS — E1142 Type 2 diabetes mellitus with diabetic polyneuropathy: Secondary | ICD-10-CM | POA: Diagnosis not present

## 2021-01-13 DIAGNOSIS — Z89422 Acquired absence of other left toe(s): Secondary | ICD-10-CM | POA: Diagnosis not present

## 2021-02-25 DIAGNOSIS — R81 Glycosuria: Secondary | ICD-10-CM | POA: Diagnosis not present

## 2021-02-25 DIAGNOSIS — R3129 Other microscopic hematuria: Secondary | ICD-10-CM | POA: Diagnosis not present

## 2021-02-25 DIAGNOSIS — N200 Calculus of kidney: Secondary | ICD-10-CM | POA: Diagnosis not present

## 2021-02-25 DIAGNOSIS — I129 Hypertensive chronic kidney disease with stage 1 through stage 4 chronic kidney disease, or unspecified chronic kidney disease: Secondary | ICD-10-CM | POA: Diagnosis not present

## 2021-02-25 DIAGNOSIS — N1831 Chronic kidney disease, stage 3a: Secondary | ICD-10-CM | POA: Diagnosis not present

## 2021-02-25 DIAGNOSIS — N183 Chronic kidney disease, stage 3 unspecified: Secondary | ICD-10-CM | POA: Diagnosis not present

## 2021-02-25 DIAGNOSIS — E1122 Type 2 diabetes mellitus with diabetic chronic kidney disease: Secondary | ICD-10-CM | POA: Diagnosis not present

## 2021-03-02 DIAGNOSIS — I499 Cardiac arrhythmia, unspecified: Secondary | ICD-10-CM | POA: Diagnosis not present

## 2021-03-02 DIAGNOSIS — N1831 Chronic kidney disease, stage 3a: Secondary | ICD-10-CM | POA: Diagnosis not present

## 2021-03-02 DIAGNOSIS — I10 Essential (primary) hypertension: Secondary | ICD-10-CM | POA: Diagnosis not present

## 2021-03-02 DIAGNOSIS — Z23 Encounter for immunization: Secondary | ICD-10-CM | POA: Diagnosis not present

## 2021-03-02 DIAGNOSIS — I129 Hypertensive chronic kidney disease with stage 1 through stage 4 chronic kidney disease, or unspecified chronic kidney disease: Secondary | ICD-10-CM | POA: Diagnosis not present

## 2021-03-02 DIAGNOSIS — I491 Atrial premature depolarization: Secondary | ICD-10-CM | POA: Diagnosis not present

## 2021-03-02 DIAGNOSIS — R55 Syncope and collapse: Secondary | ICD-10-CM | POA: Diagnosis not present

## 2021-03-02 DIAGNOSIS — E785 Hyperlipidemia, unspecified: Secondary | ICD-10-CM | POA: Diagnosis not present

## 2021-03-02 DIAGNOSIS — E1169 Type 2 diabetes mellitus with other specified complication: Secondary | ICD-10-CM | POA: Diagnosis not present

## 2021-03-02 DIAGNOSIS — E1122 Type 2 diabetes mellitus with diabetic chronic kidney disease: Secondary | ICD-10-CM | POA: Diagnosis not present

## 2021-03-24 DIAGNOSIS — N1831 Chronic kidney disease, stage 3a: Secondary | ICD-10-CM | POA: Diagnosis not present

## 2021-03-24 DIAGNOSIS — I129 Hypertensive chronic kidney disease with stage 1 through stage 4 chronic kidney disease, or unspecified chronic kidney disease: Secondary | ICD-10-CM | POA: Diagnosis not present

## 2021-03-24 DIAGNOSIS — Z7984 Long term (current) use of oral hypoglycemic drugs: Secondary | ICD-10-CM | POA: Diagnosis not present

## 2021-03-24 DIAGNOSIS — E1122 Type 2 diabetes mellitus with diabetic chronic kidney disease: Secondary | ICD-10-CM | POA: Diagnosis not present

## 2021-03-24 DIAGNOSIS — E782 Mixed hyperlipidemia: Secondary | ICD-10-CM | POA: Diagnosis not present

## 2021-07-15 DIAGNOSIS — S72112A Displaced fracture of greater trochanter of left femur, initial encounter for closed fracture: Secondary | ICD-10-CM | POA: Diagnosis not present

## 2021-07-15 DIAGNOSIS — N183 Chronic kidney disease, stage 3 unspecified: Secondary | ICD-10-CM | POA: Diagnosis not present

## 2021-07-15 DIAGNOSIS — Z7982 Long term (current) use of aspirin: Secondary | ICD-10-CM | POA: Diagnosis not present

## 2021-07-15 DIAGNOSIS — D649 Anemia, unspecified: Secondary | ICD-10-CM | POA: Diagnosis not present

## 2021-07-15 DIAGNOSIS — W19XXXA Unspecified fall, initial encounter: Secondary | ICD-10-CM | POA: Diagnosis not present

## 2021-07-15 DIAGNOSIS — Z79899 Other long term (current) drug therapy: Secondary | ICD-10-CM | POA: Diagnosis not present

## 2021-07-15 DIAGNOSIS — Z66 Do not resuscitate: Secondary | ICD-10-CM | POA: Diagnosis not present

## 2021-07-15 DIAGNOSIS — R5381 Other malaise: Secondary | ICD-10-CM | POA: Diagnosis not present

## 2021-07-15 DIAGNOSIS — R739 Hyperglycemia, unspecified: Secondary | ICD-10-CM | POA: Diagnosis not present

## 2021-07-15 DIAGNOSIS — D631 Anemia in chronic kidney disease: Secondary | ICD-10-CM | POA: Diagnosis not present

## 2021-07-15 DIAGNOSIS — R2689 Other abnormalities of gait and mobility: Secondary | ICD-10-CM | POA: Diagnosis not present

## 2021-07-15 DIAGNOSIS — E1122 Type 2 diabetes mellitus with diabetic chronic kidney disease: Secondary | ICD-10-CM | POA: Diagnosis not present

## 2021-07-15 DIAGNOSIS — S72401A Unspecified fracture of lower end of right femur, initial encounter for closed fracture: Secondary | ICD-10-CM | POA: Diagnosis not present

## 2021-07-15 DIAGNOSIS — Z794 Long term (current) use of insulin: Secondary | ICD-10-CM | POA: Diagnosis not present

## 2021-07-15 DIAGNOSIS — S32000A Wedge compression fracture of unspecified lumbar vertebra, initial encounter for closed fracture: Secondary | ICD-10-CM | POA: Diagnosis not present

## 2021-07-15 DIAGNOSIS — W1789XA Other fall from one level to another, initial encounter: Secondary | ICD-10-CM | POA: Diagnosis not present

## 2021-07-15 DIAGNOSIS — I10 Essential (primary) hypertension: Secondary | ICD-10-CM | POA: Diagnosis not present

## 2021-07-15 DIAGNOSIS — R102 Pelvic and perineal pain: Secondary | ICD-10-CM | POA: Diagnosis not present

## 2021-07-15 DIAGNOSIS — E1165 Type 2 diabetes mellitus with hyperglycemia: Secondary | ICD-10-CM | POA: Diagnosis not present

## 2021-07-15 DIAGNOSIS — M5136 Other intervertebral disc degeneration, lumbar region: Secondary | ICD-10-CM | POA: Diagnosis not present

## 2021-07-15 DIAGNOSIS — R002 Palpitations: Secondary | ICD-10-CM | POA: Diagnosis not present

## 2021-07-15 DIAGNOSIS — Z743 Need for continuous supervision: Secondary | ICD-10-CM | POA: Diagnosis not present

## 2021-07-15 DIAGNOSIS — N281 Cyst of kidney, acquired: Secondary | ICD-10-CM | POA: Diagnosis not present

## 2021-07-15 DIAGNOSIS — R600 Localized edema: Secondary | ICD-10-CM | POA: Diagnosis not present

## 2021-07-15 DIAGNOSIS — N1831 Chronic kidney disease, stage 3a: Secondary | ICD-10-CM | POA: Diagnosis not present

## 2021-07-15 DIAGNOSIS — R296 Repeated falls: Secondary | ICD-10-CM | POA: Diagnosis not present

## 2021-07-15 DIAGNOSIS — I129 Hypertensive chronic kidney disease with stage 1 through stage 4 chronic kidney disease, or unspecified chronic kidney disease: Secondary | ICD-10-CM | POA: Diagnosis not present

## 2021-07-15 DIAGNOSIS — Y998 Other external cause status: Secondary | ICD-10-CM | POA: Diagnosis not present

## 2021-07-15 DIAGNOSIS — Z981 Arthrodesis status: Secondary | ICD-10-CM | POA: Diagnosis not present

## 2021-07-15 DIAGNOSIS — E1142 Type 2 diabetes mellitus with diabetic polyneuropathy: Secondary | ICD-10-CM | POA: Diagnosis not present

## 2021-07-15 DIAGNOSIS — N189 Chronic kidney disease, unspecified: Secondary | ICD-10-CM | POA: Diagnosis not present

## 2021-07-15 DIAGNOSIS — S72102A Unspecified trochanteric fracture of left femur, initial encounter for closed fracture: Secondary | ICD-10-CM | POA: Diagnosis not present

## 2021-07-15 DIAGNOSIS — M7989 Other specified soft tissue disorders: Secondary | ICD-10-CM | POA: Diagnosis not present

## 2021-07-15 DIAGNOSIS — M1612 Unilateral primary osteoarthritis, left hip: Secondary | ICD-10-CM | POA: Diagnosis not present

## 2021-07-15 DIAGNOSIS — N179 Acute kidney failure, unspecified: Secondary | ICD-10-CM | POA: Diagnosis not present

## 2021-07-15 DIAGNOSIS — R609 Edema, unspecified: Secondary | ICD-10-CM | POA: Diagnosis not present

## 2021-07-15 DIAGNOSIS — Z7984 Long term (current) use of oral hypoglycemic drugs: Secondary | ICD-10-CM | POA: Diagnosis not present

## 2021-07-15 DIAGNOSIS — Z043 Encounter for examination and observation following other accident: Secondary | ICD-10-CM | POA: Diagnosis not present

## 2021-07-16 DIAGNOSIS — R296 Repeated falls: Secondary | ICD-10-CM | POA: Diagnosis not present

## 2021-07-16 DIAGNOSIS — E1122 Type 2 diabetes mellitus with diabetic chronic kidney disease: Secondary | ICD-10-CM | POA: Diagnosis not present

## 2021-07-16 DIAGNOSIS — R600 Localized edema: Secondary | ICD-10-CM | POA: Diagnosis not present

## 2021-07-16 DIAGNOSIS — D631 Anemia in chronic kidney disease: Secondary | ICD-10-CM | POA: Diagnosis not present

## 2021-07-16 DIAGNOSIS — N183 Chronic kidney disease, stage 3 unspecified: Secondary | ICD-10-CM | POA: Diagnosis not present

## 2021-07-16 DIAGNOSIS — S72112A Displaced fracture of greater trochanter of left femur, initial encounter for closed fracture: Secondary | ICD-10-CM | POA: Diagnosis not present

## 2021-07-16 DIAGNOSIS — Z7984 Long term (current) use of oral hypoglycemic drugs: Secondary | ICD-10-CM | POA: Diagnosis not present

## 2021-07-16 DIAGNOSIS — N179 Acute kidney failure, unspecified: Secondary | ICD-10-CM | POA: Diagnosis not present

## 2021-07-16 DIAGNOSIS — Z794 Long term (current) use of insulin: Secondary | ICD-10-CM | POA: Diagnosis not present

## 2021-07-16 DIAGNOSIS — I129 Hypertensive chronic kidney disease with stage 1 through stage 4 chronic kidney disease, or unspecified chronic kidney disease: Secondary | ICD-10-CM | POA: Diagnosis not present

## 2021-07-16 DIAGNOSIS — E1165 Type 2 diabetes mellitus with hyperglycemia: Secondary | ICD-10-CM | POA: Diagnosis not present

## 2021-07-16 DIAGNOSIS — N189 Chronic kidney disease, unspecified: Secondary | ICD-10-CM | POA: Diagnosis not present

## 2021-07-17 DIAGNOSIS — Z9181 History of falling: Secondary | ICD-10-CM | POA: Diagnosis not present

## 2021-07-17 DIAGNOSIS — R808 Other proteinuria: Secondary | ICD-10-CM | POA: Diagnosis not present

## 2021-07-17 DIAGNOSIS — N189 Chronic kidney disease, unspecified: Secondary | ICD-10-CM | POA: Diagnosis not present

## 2021-07-17 DIAGNOSIS — D631 Anemia in chronic kidney disease: Secondary | ICD-10-CM | POA: Diagnosis not present

## 2021-07-17 DIAGNOSIS — R9431 Abnormal electrocardiogram [ECG] [EKG]: Secondary | ICD-10-CM | POA: Diagnosis not present

## 2021-07-17 DIAGNOSIS — Z7984 Long term (current) use of oral hypoglycemic drugs: Secondary | ICD-10-CM | POA: Diagnosis not present

## 2021-07-17 DIAGNOSIS — N200 Calculus of kidney: Secondary | ICD-10-CM | POA: Diagnosis not present

## 2021-07-17 DIAGNOSIS — N183 Chronic kidney disease, stage 3 unspecified: Secondary | ICD-10-CM | POA: Diagnosis not present

## 2021-07-17 DIAGNOSIS — M7989 Other specified soft tissue disorders: Secondary | ICD-10-CM | POA: Diagnosis not present

## 2021-07-17 DIAGNOSIS — E1122 Type 2 diabetes mellitus with diabetic chronic kidney disease: Secondary | ICD-10-CM | POA: Diagnosis not present

## 2021-07-17 DIAGNOSIS — N179 Acute kidney failure, unspecified: Secondary | ICD-10-CM | POA: Diagnosis not present

## 2021-07-17 DIAGNOSIS — S72112A Displaced fracture of greater trochanter of left femur, initial encounter for closed fracture: Secondary | ICD-10-CM | POA: Diagnosis not present

## 2021-07-17 DIAGNOSIS — I129 Hypertensive chronic kidney disease with stage 1 through stage 4 chronic kidney disease, or unspecified chronic kidney disease: Secondary | ICD-10-CM | POA: Diagnosis not present

## 2021-07-18 DIAGNOSIS — N200 Calculus of kidney: Secondary | ICD-10-CM | POA: Diagnosis not present

## 2021-07-18 DIAGNOSIS — E1122 Type 2 diabetes mellitus with diabetic chronic kidney disease: Secondary | ICD-10-CM | POA: Diagnosis not present

## 2021-07-18 DIAGNOSIS — N179 Acute kidney failure, unspecified: Secondary | ICD-10-CM | POA: Diagnosis not present

## 2021-07-18 DIAGNOSIS — Z9181 History of falling: Secondary | ICD-10-CM | POA: Diagnosis not present

## 2021-07-18 DIAGNOSIS — I129 Hypertensive chronic kidney disease with stage 1 through stage 4 chronic kidney disease, or unspecified chronic kidney disease: Secondary | ICD-10-CM | POA: Diagnosis not present

## 2021-07-18 DIAGNOSIS — Z7984 Long term (current) use of oral hypoglycemic drugs: Secondary | ICD-10-CM | POA: Diagnosis not present

## 2021-07-18 DIAGNOSIS — N183 Chronic kidney disease, stage 3 unspecified: Secondary | ICD-10-CM | POA: Diagnosis not present

## 2021-07-18 DIAGNOSIS — S72112A Displaced fracture of greater trochanter of left femur, initial encounter for closed fracture: Secondary | ICD-10-CM | POA: Diagnosis not present

## 2021-07-18 DIAGNOSIS — N189 Chronic kidney disease, unspecified: Secondary | ICD-10-CM | POA: Diagnosis not present

## 2021-07-18 DIAGNOSIS — D631 Anemia in chronic kidney disease: Secondary | ICD-10-CM | POA: Diagnosis not present

## 2021-07-18 DIAGNOSIS — R808 Other proteinuria: Secondary | ICD-10-CM | POA: Diagnosis not present

## 2021-07-19 DIAGNOSIS — Z7984 Long term (current) use of oral hypoglycemic drugs: Secondary | ICD-10-CM | POA: Diagnosis not present

## 2021-07-19 DIAGNOSIS — N179 Acute kidney failure, unspecified: Secondary | ICD-10-CM | POA: Diagnosis not present

## 2021-07-19 DIAGNOSIS — D631 Anemia in chronic kidney disease: Secondary | ICD-10-CM | POA: Diagnosis not present

## 2021-07-19 DIAGNOSIS — Z9181 History of falling: Secondary | ICD-10-CM | POA: Diagnosis not present

## 2021-07-19 DIAGNOSIS — E1122 Type 2 diabetes mellitus with diabetic chronic kidney disease: Secondary | ICD-10-CM | POA: Diagnosis not present

## 2021-07-19 DIAGNOSIS — I129 Hypertensive chronic kidney disease with stage 1 through stage 4 chronic kidney disease, or unspecified chronic kidney disease: Secondary | ICD-10-CM | POA: Diagnosis not present

## 2021-07-19 DIAGNOSIS — N183 Chronic kidney disease, stage 3 unspecified: Secondary | ICD-10-CM | POA: Diagnosis not present

## 2021-07-19 DIAGNOSIS — N189 Chronic kidney disease, unspecified: Secondary | ICD-10-CM | POA: Diagnosis not present

## 2021-07-19 DIAGNOSIS — R808 Other proteinuria: Secondary | ICD-10-CM | POA: Diagnosis not present

## 2021-07-19 DIAGNOSIS — S72112A Displaced fracture of greater trochanter of left femur, initial encounter for closed fracture: Secondary | ICD-10-CM | POA: Diagnosis not present

## 2021-07-19 DIAGNOSIS — N200 Calculus of kidney: Secondary | ICD-10-CM | POA: Diagnosis not present

## 2021-07-20 DIAGNOSIS — E1122 Type 2 diabetes mellitus with diabetic chronic kidney disease: Secondary | ICD-10-CM | POA: Diagnosis not present

## 2021-07-20 DIAGNOSIS — M7989 Other specified soft tissue disorders: Secondary | ICD-10-CM | POA: Diagnosis not present

## 2021-07-20 DIAGNOSIS — N183 Chronic kidney disease, stage 3 unspecified: Secondary | ICD-10-CM | POA: Diagnosis not present

## 2021-07-20 DIAGNOSIS — S72112A Displaced fracture of greater trochanter of left femur, initial encounter for closed fracture: Secondary | ICD-10-CM | POA: Diagnosis not present

## 2021-07-20 DIAGNOSIS — R808 Other proteinuria: Secondary | ICD-10-CM | POA: Diagnosis not present

## 2021-07-20 DIAGNOSIS — N179 Acute kidney failure, unspecified: Secondary | ICD-10-CM | POA: Diagnosis not present

## 2021-07-20 DIAGNOSIS — N200 Calculus of kidney: Secondary | ICD-10-CM | POA: Diagnosis not present

## 2021-07-20 DIAGNOSIS — E1142 Type 2 diabetes mellitus with diabetic polyneuropathy: Secondary | ICD-10-CM | POA: Diagnosis not present

## 2021-07-20 DIAGNOSIS — Z9181 History of falling: Secondary | ICD-10-CM | POA: Diagnosis not present

## 2021-07-20 DIAGNOSIS — I129 Hypertensive chronic kidney disease with stage 1 through stage 4 chronic kidney disease, or unspecified chronic kidney disease: Secondary | ICD-10-CM | POA: Diagnosis not present

## 2021-07-20 DIAGNOSIS — D631 Anemia in chronic kidney disease: Secondary | ICD-10-CM | POA: Diagnosis not present

## 2021-07-21 DIAGNOSIS — N1831 Chronic kidney disease, stage 3a: Secondary | ICD-10-CM | POA: Diagnosis not present

## 2021-07-21 DIAGNOSIS — N179 Acute kidney failure, unspecified: Secondary | ICD-10-CM | POA: Diagnosis not present

## 2021-07-21 DIAGNOSIS — E1122 Type 2 diabetes mellitus with diabetic chronic kidney disease: Secondary | ICD-10-CM | POA: Diagnosis not present

## 2021-07-21 DIAGNOSIS — I129 Hypertensive chronic kidney disease with stage 1 through stage 4 chronic kidney disease, or unspecified chronic kidney disease: Secondary | ICD-10-CM | POA: Diagnosis not present

## 2021-07-22 DIAGNOSIS — S72111D Displaced fracture of greater trochanter of right femur, subsequent encounter for closed fracture with routine healing: Secondary | ICD-10-CM | POA: Diagnosis not present

## 2021-07-22 DIAGNOSIS — M160 Bilateral primary osteoarthritis of hip: Secondary | ICD-10-CM | POA: Diagnosis not present

## 2021-07-22 DIAGNOSIS — D631 Anemia in chronic kidney disease: Secondary | ICD-10-CM | POA: Diagnosis not present

## 2021-07-22 DIAGNOSIS — M48061 Spinal stenosis, lumbar region without neurogenic claudication: Secondary | ICD-10-CM | POA: Diagnosis not present

## 2021-07-22 DIAGNOSIS — Z7984 Long term (current) use of oral hypoglycemic drugs: Secondary | ICD-10-CM | POA: Diagnosis not present

## 2021-07-22 DIAGNOSIS — E1151 Type 2 diabetes mellitus with diabetic peripheral angiopathy without gangrene: Secondary | ICD-10-CM | POA: Diagnosis not present

## 2021-07-22 DIAGNOSIS — Z9181 History of falling: Secondary | ICD-10-CM | POA: Diagnosis not present

## 2021-07-22 DIAGNOSIS — E1142 Type 2 diabetes mellitus with diabetic polyneuropathy: Secondary | ICD-10-CM | POA: Diagnosis not present

## 2021-07-22 DIAGNOSIS — S30810D Abrasion of lower back and pelvis, subsequent encounter: Secondary | ICD-10-CM | POA: Diagnosis not present

## 2021-07-22 DIAGNOSIS — Z89422 Acquired absence of other left toe(s): Secondary | ICD-10-CM | POA: Diagnosis not present

## 2021-07-22 DIAGNOSIS — Z7982 Long term (current) use of aspirin: Secondary | ICD-10-CM | POA: Diagnosis not present

## 2021-07-22 DIAGNOSIS — H9193 Unspecified hearing loss, bilateral: Secondary | ICD-10-CM | POA: Diagnosis not present

## 2021-07-22 DIAGNOSIS — L89153 Pressure ulcer of sacral region, stage 3: Secondary | ICD-10-CM | POA: Diagnosis not present

## 2021-07-22 DIAGNOSIS — N1831 Chronic kidney disease, stage 3a: Secondary | ICD-10-CM | POA: Diagnosis not present

## 2021-07-22 DIAGNOSIS — S72112D Displaced fracture of greater trochanter of left femur, subsequent encounter for closed fracture with routine healing: Secondary | ICD-10-CM | POA: Diagnosis not present

## 2021-07-22 DIAGNOSIS — M1712 Unilateral primary osteoarthritis, left knee: Secondary | ICD-10-CM | POA: Diagnosis not present

## 2021-07-22 DIAGNOSIS — N179 Acute kidney failure, unspecified: Secondary | ICD-10-CM | POA: Diagnosis not present

## 2021-07-22 DIAGNOSIS — I129 Hypertensive chronic kidney disease with stage 1 through stage 4 chronic kidney disease, or unspecified chronic kidney disease: Secondary | ICD-10-CM | POA: Diagnosis not present

## 2021-07-22 DIAGNOSIS — E1122 Type 2 diabetes mellitus with diabetic chronic kidney disease: Secondary | ICD-10-CM | POA: Diagnosis not present

## 2021-07-22 DIAGNOSIS — K219 Gastro-esophageal reflux disease without esophagitis: Secondary | ICD-10-CM | POA: Diagnosis not present

## 2021-07-22 DIAGNOSIS — S72102A Unspecified trochanteric fracture of left femur, initial encounter for closed fracture: Secondary | ICD-10-CM | POA: Diagnosis not present

## 2021-07-22 DIAGNOSIS — E782 Mixed hyperlipidemia: Secondary | ICD-10-CM | POA: Diagnosis not present

## 2021-07-22 DIAGNOSIS — E1169 Type 2 diabetes mellitus with other specified complication: Secondary | ICD-10-CM | POA: Diagnosis not present

## 2021-07-23 DIAGNOSIS — N179 Acute kidney failure, unspecified: Secondary | ICD-10-CM | POA: Diagnosis not present

## 2021-07-23 DIAGNOSIS — N1832 Chronic kidney disease, stage 3b: Secondary | ICD-10-CM | POA: Diagnosis not present

## 2021-07-23 DIAGNOSIS — R600 Localized edema: Secondary | ICD-10-CM | POA: Diagnosis not present

## 2021-07-23 DIAGNOSIS — N183 Chronic kidney disease, stage 3 unspecified: Secondary | ICD-10-CM | POA: Diagnosis not present

## 2021-07-23 DIAGNOSIS — N1831 Chronic kidney disease, stage 3a: Secondary | ICD-10-CM | POA: Diagnosis not present

## 2021-07-23 DIAGNOSIS — M7989 Other specified soft tissue disorders: Secondary | ICD-10-CM | POA: Diagnosis not present

## 2021-07-23 DIAGNOSIS — E1122 Type 2 diabetes mellitus with diabetic chronic kidney disease: Secondary | ICD-10-CM | POA: Diagnosis not present

## 2021-07-23 DIAGNOSIS — S72102S Unspecified trochanteric fracture of left femur, sequela: Secondary | ICD-10-CM | POA: Diagnosis not present

## 2021-07-23 DIAGNOSIS — I129 Hypertensive chronic kidney disease with stage 1 through stage 4 chronic kidney disease, or unspecified chronic kidney disease: Secondary | ICD-10-CM | POA: Diagnosis not present

## 2021-07-23 DIAGNOSIS — R801 Persistent proteinuria, unspecified: Secondary | ICD-10-CM | POA: Diagnosis not present

## 2021-07-29 DIAGNOSIS — S72112D Displaced fracture of greater trochanter of left femur, subsequent encounter for closed fracture with routine healing: Secondary | ICD-10-CM | POA: Diagnosis not present

## 2021-07-29 DIAGNOSIS — H9193 Unspecified hearing loss, bilateral: Secondary | ICD-10-CM | POA: Diagnosis not present

## 2021-07-29 DIAGNOSIS — E1151 Type 2 diabetes mellitus with diabetic peripheral angiopathy without gangrene: Secondary | ICD-10-CM | POA: Diagnosis not present

## 2021-07-29 DIAGNOSIS — K219 Gastro-esophageal reflux disease without esophagitis: Secondary | ICD-10-CM | POA: Diagnosis not present

## 2021-07-29 DIAGNOSIS — Z9181 History of falling: Secondary | ICD-10-CM | POA: Diagnosis not present

## 2021-07-29 DIAGNOSIS — E1169 Type 2 diabetes mellitus with other specified complication: Secondary | ICD-10-CM | POA: Diagnosis not present

## 2021-07-29 DIAGNOSIS — E782 Mixed hyperlipidemia: Secondary | ICD-10-CM | POA: Diagnosis not present

## 2021-07-29 DIAGNOSIS — M1712 Unilateral primary osteoarthritis, left knee: Secondary | ICD-10-CM | POA: Diagnosis not present

## 2021-07-29 DIAGNOSIS — S30810D Abrasion of lower back and pelvis, subsequent encounter: Secondary | ICD-10-CM | POA: Diagnosis not present

## 2021-07-29 DIAGNOSIS — N1831 Chronic kidney disease, stage 3a: Secondary | ICD-10-CM | POA: Diagnosis not present

## 2021-07-29 DIAGNOSIS — M48061 Spinal stenosis, lumbar region without neurogenic claudication: Secondary | ICD-10-CM | POA: Diagnosis not present

## 2021-07-29 DIAGNOSIS — E1142 Type 2 diabetes mellitus with diabetic polyneuropathy: Secondary | ICD-10-CM | POA: Diagnosis not present

## 2021-07-29 DIAGNOSIS — D631 Anemia in chronic kidney disease: Secondary | ICD-10-CM | POA: Diagnosis not present

## 2021-07-29 DIAGNOSIS — Z7982 Long term (current) use of aspirin: Secondary | ICD-10-CM | POA: Diagnosis not present

## 2021-07-29 DIAGNOSIS — N179 Acute kidney failure, unspecified: Secondary | ICD-10-CM | POA: Diagnosis not present

## 2021-07-29 DIAGNOSIS — E1122 Type 2 diabetes mellitus with diabetic chronic kidney disease: Secondary | ICD-10-CM | POA: Diagnosis not present

## 2021-07-29 DIAGNOSIS — Z89422 Acquired absence of other left toe(s): Secondary | ICD-10-CM | POA: Diagnosis not present

## 2021-07-29 DIAGNOSIS — M160 Bilateral primary osteoarthritis of hip: Secondary | ICD-10-CM | POA: Diagnosis not present

## 2021-07-29 DIAGNOSIS — I129 Hypertensive chronic kidney disease with stage 1 through stage 4 chronic kidney disease, or unspecified chronic kidney disease: Secondary | ICD-10-CM | POA: Diagnosis not present

## 2021-08-04 DIAGNOSIS — S30810D Abrasion of lower back and pelvis, subsequent encounter: Secondary | ICD-10-CM | POA: Diagnosis not present

## 2021-08-04 DIAGNOSIS — E1169 Type 2 diabetes mellitus with other specified complication: Secondary | ICD-10-CM | POA: Diagnosis not present

## 2021-08-04 DIAGNOSIS — N179 Acute kidney failure, unspecified: Secondary | ICD-10-CM | POA: Diagnosis not present

## 2021-08-04 DIAGNOSIS — D631 Anemia in chronic kidney disease: Secondary | ICD-10-CM | POA: Diagnosis not present

## 2021-08-04 DIAGNOSIS — Z89422 Acquired absence of other left toe(s): Secondary | ICD-10-CM | POA: Diagnosis not present

## 2021-08-04 DIAGNOSIS — I129 Hypertensive chronic kidney disease with stage 1 through stage 4 chronic kidney disease, or unspecified chronic kidney disease: Secondary | ICD-10-CM | POA: Diagnosis not present

## 2021-08-04 DIAGNOSIS — E1142 Type 2 diabetes mellitus with diabetic polyneuropathy: Secondary | ICD-10-CM | POA: Diagnosis not present

## 2021-08-04 DIAGNOSIS — E1122 Type 2 diabetes mellitus with diabetic chronic kidney disease: Secondary | ICD-10-CM | POA: Diagnosis not present

## 2021-08-04 DIAGNOSIS — Z9181 History of falling: Secondary | ICD-10-CM | POA: Diagnosis not present

## 2021-08-04 DIAGNOSIS — S72112D Displaced fracture of greater trochanter of left femur, subsequent encounter for closed fracture with routine healing: Secondary | ICD-10-CM | POA: Diagnosis not present

## 2021-08-04 DIAGNOSIS — E1151 Type 2 diabetes mellitus with diabetic peripheral angiopathy without gangrene: Secondary | ICD-10-CM | POA: Diagnosis not present

## 2021-08-04 DIAGNOSIS — M48061 Spinal stenosis, lumbar region without neurogenic claudication: Secondary | ICD-10-CM | POA: Diagnosis not present

## 2021-08-04 DIAGNOSIS — M160 Bilateral primary osteoarthritis of hip: Secondary | ICD-10-CM | POA: Diagnosis not present

## 2021-08-04 DIAGNOSIS — Z7982 Long term (current) use of aspirin: Secondary | ICD-10-CM | POA: Diagnosis not present

## 2021-08-04 DIAGNOSIS — H9193 Unspecified hearing loss, bilateral: Secondary | ICD-10-CM | POA: Diagnosis not present

## 2021-08-04 DIAGNOSIS — E782 Mixed hyperlipidemia: Secondary | ICD-10-CM | POA: Diagnosis not present

## 2021-08-04 DIAGNOSIS — M1712 Unilateral primary osteoarthritis, left knee: Secondary | ICD-10-CM | POA: Diagnosis not present

## 2021-08-04 DIAGNOSIS — N1831 Chronic kidney disease, stage 3a: Secondary | ICD-10-CM | POA: Diagnosis not present

## 2021-08-04 DIAGNOSIS — K219 Gastro-esophageal reflux disease without esophagitis: Secondary | ICD-10-CM | POA: Diagnosis not present

## 2021-08-06 DIAGNOSIS — E1151 Type 2 diabetes mellitus with diabetic peripheral angiopathy without gangrene: Secondary | ICD-10-CM | POA: Diagnosis not present

## 2021-08-06 DIAGNOSIS — Z7982 Long term (current) use of aspirin: Secondary | ICD-10-CM | POA: Diagnosis not present

## 2021-08-06 DIAGNOSIS — E1169 Type 2 diabetes mellitus with other specified complication: Secondary | ICD-10-CM | POA: Diagnosis not present

## 2021-08-06 DIAGNOSIS — Z9181 History of falling: Secondary | ICD-10-CM | POA: Diagnosis not present

## 2021-08-06 DIAGNOSIS — S30810D Abrasion of lower back and pelvis, subsequent encounter: Secondary | ICD-10-CM | POA: Diagnosis not present

## 2021-08-06 DIAGNOSIS — M48061 Spinal stenosis, lumbar region without neurogenic claudication: Secondary | ICD-10-CM | POA: Diagnosis not present

## 2021-08-06 DIAGNOSIS — N1831 Chronic kidney disease, stage 3a: Secondary | ICD-10-CM | POA: Diagnosis not present

## 2021-08-06 DIAGNOSIS — H9193 Unspecified hearing loss, bilateral: Secondary | ICD-10-CM | POA: Diagnosis not present

## 2021-08-06 DIAGNOSIS — E1142 Type 2 diabetes mellitus with diabetic polyneuropathy: Secondary | ICD-10-CM | POA: Diagnosis not present

## 2021-08-06 DIAGNOSIS — E1122 Type 2 diabetes mellitus with diabetic chronic kidney disease: Secondary | ICD-10-CM | POA: Diagnosis not present

## 2021-08-06 DIAGNOSIS — D631 Anemia in chronic kidney disease: Secondary | ICD-10-CM | POA: Diagnosis not present

## 2021-08-06 DIAGNOSIS — M160 Bilateral primary osteoarthritis of hip: Secondary | ICD-10-CM | POA: Diagnosis not present

## 2021-08-06 DIAGNOSIS — S72112D Displaced fracture of greater trochanter of left femur, subsequent encounter for closed fracture with routine healing: Secondary | ICD-10-CM | POA: Diagnosis not present

## 2021-08-06 DIAGNOSIS — M1712 Unilateral primary osteoarthritis, left knee: Secondary | ICD-10-CM | POA: Diagnosis not present

## 2021-08-06 DIAGNOSIS — I129 Hypertensive chronic kidney disease with stage 1 through stage 4 chronic kidney disease, or unspecified chronic kidney disease: Secondary | ICD-10-CM | POA: Diagnosis not present

## 2021-08-06 DIAGNOSIS — Z89422 Acquired absence of other left toe(s): Secondary | ICD-10-CM | POA: Diagnosis not present

## 2021-08-06 DIAGNOSIS — K219 Gastro-esophageal reflux disease without esophagitis: Secondary | ICD-10-CM | POA: Diagnosis not present

## 2021-08-06 DIAGNOSIS — E782 Mixed hyperlipidemia: Secondary | ICD-10-CM | POA: Diagnosis not present

## 2021-08-06 DIAGNOSIS — N179 Acute kidney failure, unspecified: Secondary | ICD-10-CM | POA: Diagnosis not present

## 2021-08-10 ENCOUNTER — Emergency Department (HOSPITAL_BASED_OUTPATIENT_CLINIC_OR_DEPARTMENT_OTHER)
Admission: EM | Admit: 2021-08-10 | Discharge: 2021-08-10 | Disposition: A | Discharge status: Home / Self Care | Payer: Medicare HMO | Attending: Emergency Medicine | Admitting: Emergency Medicine

## 2021-08-10 ENCOUNTER — Emergency Department (HOSPITAL_BASED_OUTPATIENT_CLINIC_OR_DEPARTMENT_OTHER): Payer: Medicare HMO

## 2021-08-10 ENCOUNTER — Other Ambulatory Visit: Payer: Self-pay

## 2021-08-10 ENCOUNTER — Encounter (HOSPITAL_BASED_OUTPATIENT_CLINIC_OR_DEPARTMENT_OTHER): Payer: Self-pay | Admitting: Emergency Medicine

## 2021-08-10 DIAGNOSIS — R197 Diarrhea, unspecified: Secondary | ICD-10-CM | POA: Diagnosis not present

## 2021-08-10 DIAGNOSIS — Z79899 Other long term (current) drug therapy: Secondary | ICD-10-CM | POA: Insufficient documentation

## 2021-08-10 DIAGNOSIS — N183 Chronic kidney disease, stage 3 unspecified: Secondary | ICD-10-CM | POA: Insufficient documentation

## 2021-08-10 DIAGNOSIS — I10 Essential (primary) hypertension: Secondary | ICD-10-CM | POA: Diagnosis not present

## 2021-08-10 DIAGNOSIS — R634 Abnormal weight loss: Secondary | ICD-10-CM | POA: Diagnosis not present

## 2021-08-10 DIAGNOSIS — L89152 Pressure ulcer of sacral region, stage 2: Secondary | ICD-10-CM | POA: Diagnosis not present

## 2021-08-10 DIAGNOSIS — D72829 Elevated white blood cell count, unspecified: Secondary | ICD-10-CM | POA: Insufficient documentation

## 2021-08-10 DIAGNOSIS — R918 Other nonspecific abnormal finding of lung field: Secondary | ICD-10-CM | POA: Diagnosis not present

## 2021-08-10 DIAGNOSIS — N179 Acute kidney failure, unspecified: Secondary | ICD-10-CM | POA: Insufficient documentation

## 2021-08-10 DIAGNOSIS — R638 Other symptoms and signs concerning food and fluid intake: Secondary | ICD-10-CM | POA: Diagnosis not present

## 2021-08-10 DIAGNOSIS — Z7982 Long term (current) use of aspirin: Secondary | ICD-10-CM | POA: Insufficient documentation

## 2021-08-10 DIAGNOSIS — E1122 Type 2 diabetes mellitus with diabetic chronic kidney disease: Secondary | ICD-10-CM | POA: Diagnosis not present

## 2021-08-10 DIAGNOSIS — E1165 Type 2 diabetes mellitus with hyperglycemia: Secondary | ICD-10-CM | POA: Diagnosis not present

## 2021-08-10 DIAGNOSIS — I129 Hypertensive chronic kidney disease with stage 1 through stage 4 chronic kidney disease, or unspecified chronic kidney disease: Secondary | ICD-10-CM | POA: Insufficient documentation

## 2021-08-10 DIAGNOSIS — J984 Other disorders of lung: Secondary | ICD-10-CM | POA: Diagnosis not present

## 2021-08-10 DIAGNOSIS — Z7984 Long term (current) use of oral hypoglycemic drugs: Secondary | ICD-10-CM | POA: Diagnosis not present

## 2021-08-10 LAB — URINALYSIS, MICROSCOPIC (REFLEX): Squamous Epithelial / HPF: NONE SEEN (ref 0–5)

## 2021-08-10 LAB — URINALYSIS, ROUTINE W REFLEX MICROSCOPIC
Bilirubin Urine: NEGATIVE
Glucose, UA: >=500 mg/dL — AB
Ketones, ur: NEGATIVE mg/dL
Nitrite: NEGATIVE
Protein, ur: >300 mg/dL — AB
Specific Gravity, Urine: 1.015 (ref 1.005–1.030)
pH: 5.5 (ref 5.0–8.0)

## 2021-08-10 LAB — CBC
HCT: 31.9 % — ABNORMAL LOW (ref 39.0–52.0)
Hemoglobin: 10 g/dL — ABNORMAL LOW (ref 13.0–17.0)
MCH: 28.8 pg (ref 26.0–34.0)
MCHC: 31.3 g/dL (ref 30.0–36.0)
MCV: 91.9 fL (ref 80.0–100.0)
Platelets: 344 10*3/uL (ref 150–400)
RBC: 3.47 MIL/uL — ABNORMAL LOW (ref 4.22–5.81)
RDW: 14.3 % (ref 11.5–15.5)
WBC: 15.6 10*3/uL — ABNORMAL HIGH (ref 4.0–10.5)
nRBC: 0 % (ref 0.0–0.2)

## 2021-08-10 LAB — COMPREHENSIVE METABOLIC PANEL
ALT: 11 U/L (ref 0–44)
AST: 13 U/L — ABNORMAL LOW (ref 15–41)
Albumin: 2.6 g/dL — ABNORMAL LOW (ref 3.5–5.0)
Alkaline Phosphatase: 129 U/L — ABNORMAL HIGH (ref 38–126)
Anion gap: 11 (ref 5–15)
BUN: 56 mg/dL — ABNORMAL HIGH (ref 8–23)
CO2: 23 mmol/L (ref 22–32)
Calcium: 8.4 mg/dL — ABNORMAL LOW (ref 8.9–10.3)
Chloride: 103 mmol/L (ref 98–111)
Creatinine, Ser: 2.67 mg/dL — ABNORMAL HIGH (ref 0.61–1.24)
GFR, Estimated: 23 mL/min — ABNORMAL LOW (ref >60)
Glucose, Bld: 291 mg/dL — ABNORMAL HIGH (ref 70–99)
Potassium: 4.6 mmol/L (ref 3.5–5.1)
Sodium: 137 mmol/L (ref 135–145)
Total Bilirubin: 0.3 mg/dL (ref 0.3–1.2)
Total Protein: 6.5 g/dL (ref 6.5–8.1)

## 2021-08-10 NOTE — ED Notes (Signed)
Unable to collect stool specimen at this time,  Pt incontinent of small amount liquid stool.  Pt aware of need to collect specimen when able. ?

## 2021-08-10 NOTE — ED Provider Notes (Signed)
?[facility] EMERGENCY DEPARTMENT ?Provider Note ? ? ?CSN: [id number] ?Arrival date & time: 08/10/21  1542 ? ?  ? ?History ? ?Chief Complaint  ?Patient presents with  ? Diarrhea  ? ? ?Jason Mcpherson is a 81 y.o. male with past medical history of diabetes mellitus, CKD stage III, hypertension, anemia.  Presents to the emergency department today with a chief complaint of diarrhea.  Patient has had diarrhea constantly over the last month after being discharged from [facility].  Patient's diarrhea has been worse over the last 4 days.  Patient is going to the bathroom almost every hour.  Family reports that patient has had 4 episodes of diarrhea this morning.  Denies any recent antibiotic use, international travel, or camping.  Patient has been eating and drinking well with family reported that they have been giving patient Pedialyte to help with hydration. ? ?Patient does complain of pain to buttocks.  Family reports that he has a wound to sacrum.  Patient's family members have been putting bacitracin on the wound to help care for it.  They deny any worsening erythema surrounding the wound.  Denies any purulent discharge. ? ?Patient denies any fever, chills, abdominal pain, nausea, vomiting, constipation, blood in stool, melena, dysuria, hematuria, urinary urgency, lightheadedness, syncope. ? ? ?Diarrhea ?Associated symptoms: no abdominal pain, no chills, no fever, no headaches and no vomiting   ? ?  ? ?Home Medications ?Prior to Admission medications   ?Medication Sig Start Date End Date Taking? Authorizing Provider  ?amLODipine (NORVASC) 5 MG tablet TAKE 1 TABLET (5 MG TOTAL) BY MOUTH DAILY. 07/05/20   Elayne Snare, MD  ?aspirin 81 MG chewable tablet Chew 81 mg by mouth daily.    [provider]  ?betamethasone dipropionate (DIPROLENE) 0.05 % cream Apply topically 2 (two) times daily.    [provider]  ?glucose blood (ONETOUCH ULTRA) test strip USE AS DIRECTED TO TEST  TWICE DAILY 05/13/20   Elayne Snare, MD  ?Lancets (ONETOUCH DELICA PLUS [id number]) [place] 2X DAILY. 06/06/20   Elayne Snare, MD  ?losartan-hydrochlorothiazide (HYZAAR) 50-12.5 MG tablet Take 1 tablet by mouth daily. 07/16/20   Elayne Snare, MD  ?metFORMIN (GLUCOPHAGE) 1000 MG tablet TAKE 1 TABLET EVERY MORNING AND 1/2 TABLET IN THE EVENING 08/06/20   Elayne Snare, MD  ?Jonetta Speak LANCETS FINE MISC USED TO CHECK SUGAR 2X DAILY. 12/28/16   Elayne Snare, MD  ?repaglinide (PRANDIN) 0.5 MG tablet Take 1 tablet (0.5 mg total) by mouth 2 (two) times daily before a meal. 07/08/20   Elayne Snare, MD  ?simvastatin (ZOCOR) 20 MG tablet TAKE 1 TABLET BY MOUTH EVERY DAY IN THE EVENING 03/30/20   Elayne Snare, MD  ?   ? ?Allergies    ?Patient has no known allergies.   ? ?Review of Systems   ?Review of Systems  ?Constitutional:  Negative for chills and fever.  ?Eyes:  Negative for visual disturbance.  ?Respiratory:  Negative for shortness of breath.   ?Cardiovascular:  Negative for chest pain.  ?Gastrointestinal:  Positive for diarrhea. Negative for abdominal distention, abdominal pain, anal bleeding, blood in stool, constipation, nausea, rectal pain and vomiting.  ?Genitourinary:  Negative for difficulty urinating, dysuria, hematuria and urgency.  ?Musculoskeletal:  Negative for back pain and neck pain.  ?Skin:  Positive for wound. Negative for color change, pallor and rash.  ?Neurological:  Negative for dizziness, syncope, light-headedness and headaches.  ?Psychiatric/Behavioral:  Negative for confusion.   ? ?  Physical Exam ?Updated Vital Signs ?BP 134/74   Pulse 74   Temp 98 ?F (36.7 ?C) (Oral)   Resp 16   Ht '5\' 6"'  (1.676 m)   Wt 68 kg   SpO2 98%   BMI 24.21 kg/m?  ?Physical Exam ?Vitals and nursing note reviewed. Chaperone present: Male RN present as Producer, television/film/video.Marland Kitchen  ?Constitutional:   ?   General: He is not in acute distress. ?   Appearance: He is not ill-appearing, toxic-appearing or diaphoretic.  ?HENT:  ?    Head: Normocephalic.  ?Eyes:  ?   General: No scleral icterus.    ?   Right eye: No discharge.     ?   Left eye: No discharge.  ?Cardiovascular:  ?   Rate and Rhythm: Normal rate.  ?Pulmonary:  ?   Effort: Pulmonary effort is normal. No tachypnea, bradypnea or respiratory distress.  ?   Breath sounds: Normal breath sounds. No stridor.  ?Abdominal:  ?   General: Bowel sounds are normal. There is no distension. There are no signs of injury.  ?   Palpations: Abdomen is soft. There is no mass or pulsatile mass.  ?   Tenderness: There is no abdominal tenderness. There is no guarding or rebound.  ?   Hernia: There is no hernia in the umbilical area or ventral area.  ?Genitourinary: ?   Comments: Patient has quarter sized stage II pressure ulcer to sacrum.  There is surrounding stage I pressure ulcer to sacrum as well. ?Skin: ?   General: Skin is warm and dry.  ?Neurological:  ?   General: No focal deficit present.  ?   Mental Status: He is alert.  ?Psychiatric:     ?   Behavior: Behavior is cooperative.  ? ? ?ED Results / Procedures / Treatments   ?Labs ?(all labs ordered are listed, but only abnormal results are displayed) ?Labs Reviewed  ?CBC - Abnormal; Notable for the following components:  ?    Result Value  ? WBC 15.6 (*)   ? RBC 3.47 (*)   ? Hemoglobin 10.0 (*)   ? HCT 31.9 (*)   ? All other components within normal limits  ?URINALYSIS, ROUTINE W REFLEX MICROSCOPIC - Abnormal; Notable for the following components:  ? APPearance CLOUDY (*)   ? Glucose, UA >=500 (*)   ? Hgb urine dipstick TRACE (*)   ? Protein, ur >300 (*)   ? Leukocytes,Ua TRACE (*)   ? All other components within normal limits  ?COMPREHENSIVE METABOLIC PANEL - Abnormal; Notable for the following components:  ? Glucose, Bld 291 (*)   ? BUN 56 (*)   ? Creatinine, Ser 2.67 (*)   ? Calcium 8.4 (*)   ? Albumin 2.6 (*)   ? AST 13 (*)   ? Alkaline Phosphatase 129 (*)   ? GFR, Estimated 23 (*)   ? All other components within normal limits  ?URINALYSIS,  MICROSCOPIC (REFLEX) - Abnormal; Notable for the following components:  ? Bacteria, UA FEW (*)   ? All other components within normal limits  ?GASTROINTESTINAL PANEL BY PCR, STOOL (REPLACES STOOL CULTURE)  ?C DIFFICILE QUICK SCREEN W PCR REFLEX    ? ? ?EKG ?None ? ?Radiology ?No results found. ? ?Procedures ?Procedures  ? ? ?Medications Ordered in ED ?Medications - No data to display ? ?ED Course/ Medical Decision Making/ A&P ?  ?                        ?  Medical Decision Making ?Amount and/or Complexity of Data Reviewed ?Labs: ordered. ?Radiology: ordered. ? ? ?Alert 81 year old male in no acute distress, nontoxic-appearing.  Presents to the emergency department with a chief complaint of diarrhea. ? ?Information obtained from patient, patient's son, and patient's wife.  Past medical records were reviewed including previous provider notes, labs, imaging.  Patient has medical history as outlined in HPI which complicates his care. ? ?While patient has not had any recent antibiotic use due to frequent nature of his diarrhea after discharge from hospital concern for possible C. difficile.  Will place order for C. difficile and GI stool testing.  Additionally will check CMP, CBC, and UA. ? ?I personally viewed and interpreted patient's EKG.  Tracing shows sinus rhythm ? ?I personally viewed and interpreted patient's chest x-ray.  Imaging shows no no focal evidence of pneumonia. ? ?I personally viewed and interpreted patient's lab results.  Pertinent findings include: ?-Leukocytosis 15.6 ?-BUN 56, creatinine 2.67. ?-Alk phos 129 ?-Urinalysis shows bacteria few, WBC 11-20, leukocyte esterase, nitrite negative. ?-Unable to obtain stool sample for testing ? ?Patient's baseline creatinine appears to be 2-2.20 based on labs reviewed from care everywhere.  Suspect that he has a AKI on top of his chronic CKD due to frequent episodes of diarrhea. ? ?Patient care discussed with attending physician Dr. Billy Fischer. ? ?Shared decision  making with patient and patient's family about admission for rehydration versus discharge and close follow-up with PCP.  Patient and patient's family elects for discharge and close follow-up.  Due to patient's sa

## 2021-08-10 NOTE — ED Triage Notes (Signed)
Pt arrives pov, to triage in wheelchair, family reports watery diarrhea x 1 month, worsening x 4 days. Referred by UC. Denies abx use, denies abdominal pain . Pt reports skin breakdown on buttocks at coccyx ?

## 2021-08-10 NOTE — Discharge Instructions (Addendum)
You came to the emergency department today to be evaluated for your diarrhea.  We were unable to obtain a stool sample today to send for testing. ? ?The remainder of your lab work showed that you have a acute kidney injury on top of your chronic kidney disease.  Please continue to hydrate well and avoid any NSAID medications.  Please follow-up closely with your primary care doctor to have this rechecked in the outpatient setting. ? ?I have placed an ambulatory referral to the wound care clinic.  They should reach out to you in the next 3-5 business days.  If you do not hear back from them please use the information on this paperwork to give them a call to schedule a follow-up appointment. ? ?Get help right away if: ?You have chest pain. ?You feel extremely weak or you faint. ?You have bloody or black stools or stools that look like tar. ?You have severe pain, cramping, or bloating in your abdomen. ?You have trouble breathing or you are breathing very quickly. ?Your heart is beating very quickly. ?Your skin feels cold and clammy. ?You feel confused. ?You have signs of dehydration, such as: ?Dark urine, very little urine, or no urine. ?Cracked lips. ?Dry mouth. ?Sunken eyes. ?Sleepiness. ?Weakness. ?

## 2021-08-11 DIAGNOSIS — N179 Acute kidney failure, unspecified: Secondary | ICD-10-CM | POA: Diagnosis not present

## 2021-08-11 DIAGNOSIS — E782 Mixed hyperlipidemia: Secondary | ICD-10-CM | POA: Diagnosis not present

## 2021-08-11 DIAGNOSIS — I129 Hypertensive chronic kidney disease with stage 1 through stage 4 chronic kidney disease, or unspecified chronic kidney disease: Secondary | ICD-10-CM | POA: Diagnosis not present

## 2021-08-11 DIAGNOSIS — M1712 Unilateral primary osteoarthritis, left knee: Secondary | ICD-10-CM | POA: Diagnosis not present

## 2021-08-11 DIAGNOSIS — E1142 Type 2 diabetes mellitus with diabetic polyneuropathy: Secondary | ICD-10-CM | POA: Diagnosis not present

## 2021-08-11 DIAGNOSIS — Z7982 Long term (current) use of aspirin: Secondary | ICD-10-CM | POA: Diagnosis not present

## 2021-08-11 DIAGNOSIS — K219 Gastro-esophageal reflux disease without esophagitis: Secondary | ICD-10-CM | POA: Diagnosis not present

## 2021-08-11 DIAGNOSIS — Z9181 History of falling: Secondary | ICD-10-CM | POA: Diagnosis not present

## 2021-08-11 DIAGNOSIS — Z89422 Acquired absence of other left toe(s): Secondary | ICD-10-CM | POA: Diagnosis not present

## 2021-08-11 DIAGNOSIS — E1169 Type 2 diabetes mellitus with other specified complication: Secondary | ICD-10-CM | POA: Diagnosis not present

## 2021-08-11 DIAGNOSIS — M48061 Spinal stenosis, lumbar region without neurogenic claudication: Secondary | ICD-10-CM | POA: Diagnosis not present

## 2021-08-11 DIAGNOSIS — H9193 Unspecified hearing loss, bilateral: Secondary | ICD-10-CM | POA: Diagnosis not present

## 2021-08-11 DIAGNOSIS — M160 Bilateral primary osteoarthritis of hip: Secondary | ICD-10-CM | POA: Diagnosis not present

## 2021-08-11 DIAGNOSIS — D631 Anemia in chronic kidney disease: Secondary | ICD-10-CM | POA: Diagnosis not present

## 2021-08-11 DIAGNOSIS — S30810D Abrasion of lower back and pelvis, subsequent encounter: Secondary | ICD-10-CM | POA: Diagnosis not present

## 2021-08-11 DIAGNOSIS — E1122 Type 2 diabetes mellitus with diabetic chronic kidney disease: Secondary | ICD-10-CM | POA: Diagnosis not present

## 2021-08-11 DIAGNOSIS — N1831 Chronic kidney disease, stage 3a: Secondary | ICD-10-CM | POA: Diagnosis not present

## 2021-08-11 DIAGNOSIS — E1151 Type 2 diabetes mellitus with diabetic peripheral angiopathy without gangrene: Secondary | ICD-10-CM | POA: Diagnosis not present

## 2021-08-11 DIAGNOSIS — S72112D Displaced fracture of greater trochanter of left femur, subsequent encounter for closed fracture with routine healing: Secondary | ICD-10-CM | POA: Diagnosis not present

## 2021-08-13 DIAGNOSIS — E782 Mixed hyperlipidemia: Secondary | ICD-10-CM | POA: Diagnosis not present

## 2021-08-13 DIAGNOSIS — D631 Anemia in chronic kidney disease: Secondary | ICD-10-CM | POA: Diagnosis not present

## 2021-08-13 DIAGNOSIS — I129 Hypertensive chronic kidney disease with stage 1 through stage 4 chronic kidney disease, or unspecified chronic kidney disease: Secondary | ICD-10-CM | POA: Diagnosis not present

## 2021-08-13 DIAGNOSIS — S30810D Abrasion of lower back and pelvis, subsequent encounter: Secondary | ICD-10-CM | POA: Diagnosis not present

## 2021-08-13 DIAGNOSIS — Z7982 Long term (current) use of aspirin: Secondary | ICD-10-CM | POA: Diagnosis not present

## 2021-08-13 DIAGNOSIS — M48061 Spinal stenosis, lumbar region without neurogenic claudication: Secondary | ICD-10-CM | POA: Diagnosis not present

## 2021-08-13 DIAGNOSIS — Z89422 Acquired absence of other left toe(s): Secondary | ICD-10-CM | POA: Diagnosis not present

## 2021-08-13 DIAGNOSIS — E1169 Type 2 diabetes mellitus with other specified complication: Secondary | ICD-10-CM | POA: Diagnosis not present

## 2021-08-13 DIAGNOSIS — K219 Gastro-esophageal reflux disease without esophagitis: Secondary | ICD-10-CM | POA: Diagnosis not present

## 2021-08-13 DIAGNOSIS — H9193 Unspecified hearing loss, bilateral: Secondary | ICD-10-CM | POA: Diagnosis not present

## 2021-08-13 DIAGNOSIS — M160 Bilateral primary osteoarthritis of hip: Secondary | ICD-10-CM | POA: Diagnosis not present

## 2021-08-13 DIAGNOSIS — S72112D Displaced fracture of greater trochanter of left femur, subsequent encounter for closed fracture with routine healing: Secondary | ICD-10-CM | POA: Diagnosis not present

## 2021-08-13 DIAGNOSIS — Z9181 History of falling: Secondary | ICD-10-CM | POA: Diagnosis not present

## 2021-08-13 DIAGNOSIS — N1831 Chronic kidney disease, stage 3a: Secondary | ICD-10-CM | POA: Diagnosis not present

## 2021-08-13 DIAGNOSIS — E1122 Type 2 diabetes mellitus with diabetic chronic kidney disease: Secondary | ICD-10-CM | POA: Diagnosis not present

## 2021-08-13 DIAGNOSIS — N179 Acute kidney failure, unspecified: Secondary | ICD-10-CM | POA: Diagnosis not present

## 2021-08-13 DIAGNOSIS — E1142 Type 2 diabetes mellitus with diabetic polyneuropathy: Secondary | ICD-10-CM | POA: Diagnosis not present

## 2021-08-13 DIAGNOSIS — E1151 Type 2 diabetes mellitus with diabetic peripheral angiopathy without gangrene: Secondary | ICD-10-CM | POA: Diagnosis not present

## 2021-08-13 DIAGNOSIS — M1712 Unilateral primary osteoarthritis, left knee: Secondary | ICD-10-CM | POA: Diagnosis not present

## 2021-08-17 DIAGNOSIS — K219 Gastro-esophageal reflux disease without esophagitis: Secondary | ICD-10-CM | POA: Diagnosis not present

## 2021-08-17 DIAGNOSIS — Z89422 Acquired absence of other left toe(s): Secondary | ICD-10-CM | POA: Diagnosis not present

## 2021-08-17 DIAGNOSIS — D631 Anemia in chronic kidney disease: Secondary | ICD-10-CM | POA: Diagnosis not present

## 2021-08-17 DIAGNOSIS — M160 Bilateral primary osteoarthritis of hip: Secondary | ICD-10-CM | POA: Diagnosis not present

## 2021-08-17 DIAGNOSIS — Z7982 Long term (current) use of aspirin: Secondary | ICD-10-CM | POA: Diagnosis not present

## 2021-08-17 DIAGNOSIS — M48061 Spinal stenosis, lumbar region without neurogenic claudication: Secondary | ICD-10-CM | POA: Diagnosis not present

## 2021-08-17 DIAGNOSIS — E1122 Type 2 diabetes mellitus with diabetic chronic kidney disease: Secondary | ICD-10-CM | POA: Diagnosis not present

## 2021-08-17 DIAGNOSIS — I129 Hypertensive chronic kidney disease with stage 1 through stage 4 chronic kidney disease, or unspecified chronic kidney disease: Secondary | ICD-10-CM | POA: Diagnosis not present

## 2021-08-17 DIAGNOSIS — S72112D Displaced fracture of greater trochanter of left femur, subsequent encounter for closed fracture with routine healing: Secondary | ICD-10-CM | POA: Diagnosis not present

## 2021-08-17 DIAGNOSIS — H9193 Unspecified hearing loss, bilateral: Secondary | ICD-10-CM | POA: Diagnosis not present

## 2021-08-17 DIAGNOSIS — N179 Acute kidney failure, unspecified: Secondary | ICD-10-CM | POA: Diagnosis not present

## 2021-08-17 DIAGNOSIS — Z9181 History of falling: Secondary | ICD-10-CM | POA: Diagnosis not present

## 2021-08-17 DIAGNOSIS — E1151 Type 2 diabetes mellitus with diabetic peripheral angiopathy without gangrene: Secondary | ICD-10-CM | POA: Diagnosis not present

## 2021-08-17 DIAGNOSIS — E1142 Type 2 diabetes mellitus with diabetic polyneuropathy: Secondary | ICD-10-CM | POA: Diagnosis not present

## 2021-08-17 DIAGNOSIS — E782 Mixed hyperlipidemia: Secondary | ICD-10-CM | POA: Diagnosis not present

## 2021-08-17 DIAGNOSIS — M1712 Unilateral primary osteoarthritis, left knee: Secondary | ICD-10-CM | POA: Diagnosis not present

## 2021-08-17 DIAGNOSIS — S30810D Abrasion of lower back and pelvis, subsequent encounter: Secondary | ICD-10-CM | POA: Diagnosis not present

## 2021-08-17 DIAGNOSIS — E1169 Type 2 diabetes mellitus with other specified complication: Secondary | ICD-10-CM | POA: Diagnosis not present

## 2021-08-17 DIAGNOSIS — N1831 Chronic kidney disease, stage 3a: Secondary | ICD-10-CM | POA: Diagnosis not present

## 2021-08-19 ENCOUNTER — Encounter (HOSPITAL_BASED_OUTPATIENT_CLINIC_OR_DEPARTMENT_OTHER): Payer: Medicare HMO | Attending: Physician Assistant | Admitting: Physician Assistant

## 2021-08-19 DIAGNOSIS — M6281 Muscle weakness (generalized): Secondary | ICD-10-CM | POA: Diagnosis not present

## 2021-08-19 DIAGNOSIS — L89323 Pressure ulcer of left buttock, stage 3: Secondary | ICD-10-CM | POA: Insufficient documentation

## 2021-08-19 DIAGNOSIS — L89313 Pressure ulcer of right buttock, stage 3: Secondary | ICD-10-CM | POA: Insufficient documentation

## 2021-08-19 DIAGNOSIS — Z7982 Long term (current) use of aspirin: Secondary | ICD-10-CM | POA: Diagnosis not present

## 2021-08-19 DIAGNOSIS — I129 Hypertensive chronic kidney disease with stage 1 through stage 4 chronic kidney disease, or unspecified chronic kidney disease: Secondary | ICD-10-CM | POA: Diagnosis not present

## 2021-08-19 DIAGNOSIS — L89303 Pressure ulcer of unspecified buttock, stage 3: Secondary | ICD-10-CM | POA: Diagnosis not present

## 2021-08-19 DIAGNOSIS — H9193 Unspecified hearing loss, bilateral: Secondary | ICD-10-CM | POA: Diagnosis not present

## 2021-08-19 DIAGNOSIS — M48061 Spinal stenosis, lumbar region without neurogenic claudication: Secondary | ICD-10-CM | POA: Diagnosis not present

## 2021-08-19 DIAGNOSIS — S72112D Displaced fracture of greater trochanter of left femur, subsequent encounter for closed fracture with routine healing: Secondary | ICD-10-CM | POA: Diagnosis not present

## 2021-08-19 DIAGNOSIS — S30810D Abrasion of lower back and pelvis, subsequent encounter: Secondary | ICD-10-CM | POA: Diagnosis not present

## 2021-08-19 DIAGNOSIS — I10 Essential (primary) hypertension: Secondary | ICD-10-CM | POA: Diagnosis not present

## 2021-08-19 DIAGNOSIS — Z9181 History of falling: Secondary | ICD-10-CM | POA: Diagnosis not present

## 2021-08-19 DIAGNOSIS — E1169 Type 2 diabetes mellitus with other specified complication: Secondary | ICD-10-CM | POA: Diagnosis not present

## 2021-08-19 DIAGNOSIS — Z89422 Acquired absence of other left toe(s): Secondary | ICD-10-CM | POA: Diagnosis not present

## 2021-08-19 DIAGNOSIS — M1712 Unilateral primary osteoarthritis, left knee: Secondary | ICD-10-CM | POA: Diagnosis not present

## 2021-08-19 DIAGNOSIS — E1122 Type 2 diabetes mellitus with diabetic chronic kidney disease: Secondary | ICD-10-CM | POA: Insufficient documentation

## 2021-08-19 DIAGNOSIS — N179 Acute kidney failure, unspecified: Secondary | ICD-10-CM | POA: Diagnosis not present

## 2021-08-19 DIAGNOSIS — E1151 Type 2 diabetes mellitus with diabetic peripheral angiopathy without gangrene: Secondary | ICD-10-CM | POA: Diagnosis not present

## 2021-08-19 DIAGNOSIS — E782 Mixed hyperlipidemia: Secondary | ICD-10-CM | POA: Diagnosis not present

## 2021-08-19 DIAGNOSIS — N1831 Chronic kidney disease, stage 3a: Secondary | ICD-10-CM | POA: Diagnosis not present

## 2021-08-19 DIAGNOSIS — E11622 Type 2 diabetes mellitus with other skin ulcer: Secondary | ICD-10-CM | POA: Insufficient documentation

## 2021-08-19 DIAGNOSIS — N183 Chronic kidney disease, stage 3 unspecified: Secondary | ICD-10-CM | POA: Diagnosis not present

## 2021-08-19 DIAGNOSIS — M160 Bilateral primary osteoarthritis of hip: Secondary | ICD-10-CM | POA: Diagnosis not present

## 2021-08-19 DIAGNOSIS — D631 Anemia in chronic kidney disease: Secondary | ICD-10-CM | POA: Diagnosis not present

## 2021-08-19 DIAGNOSIS — K219 Gastro-esophageal reflux disease without esophagitis: Secondary | ICD-10-CM | POA: Diagnosis not present

## 2021-08-19 DIAGNOSIS — E1142 Type 2 diabetes mellitus with diabetic polyneuropathy: Secondary | ICD-10-CM | POA: Diagnosis not present

## 2021-08-19 NOTE — Progress Notes (Signed)
OSMANY, AZER. ([id number]) ?Visit Report for 08/19/2021 ?Chief Complaint Document Details ?Patient Name: Date of Service: ?Jason Mcpherson, Jason RSHA [place] W. 08/19/2021 9:00 A M ?Medical Record Number: [id number] ?Patient Account Number: [id number] ?Date of Birth/Sex: Treating RN: ?1940/05/31 (81 y.o. M) Rolin Barry, Bobbi ?Primary Care Provider: SILV A ZA PA TA, EDWIN Other Clinician: ?Referring Provider: ?Treating Provider/Extender: Worthy Keeler ?SILV A ZA PA TA, EDWIN ?Weeks in Treatment: 0 ?Information Obtained from: Patient ?Chief Complaint ?Bilateral gluteal pressure ulcers ?Electronic Signature(s) ?Signed: 08/19/2021 9:37:31 AM By: Worthy Keeler PA-C ?Entered By: Worthy Keeler on 08/19/2021 09:37:31 ?-------------------------------------------------------------------------------- ?Debridement Details ?Patient Name: Date of Service: ?Jason Mcpherson, Jason RSHA [place] W. 08/19/2021 9:00 A M ?Medical Record Number: [id number] ?Patient Account Number: [id number] ?Date of Birth/Sex: Treating RN: ?1941/02/13 (81 y.o. M) Rolin Barry, Bobbi ?Primary Care Provider: SILV A ZA PA TA, EDWIN Other Clinician: ?Referring Provider: ?Treating Provider/Extender: Worthy Keeler ?SILV A ZA PA TA, EDWIN ?Weeks in Treatment: 0 ?Debridement Performed for Assessment: Wound #2 Right Gluteus ?Performed By: Clinician Deon Pilling, RN ?Debridement Type: Chemical/Enzymatic/Mechanical ?Agent Used: gauze and wound cleanser ?Level of Consciousness (Pre-procedure): Awake and Alert ?Pre-procedure Verification/Time Out No ?Taken: ?Bleeding: None ?Response to Treatment: Procedure was tolerated well ?Level of Consciousness (Post- Awake and Alert ?procedure): ?Post Debridement Measurements of Total Wound ?Length: (cm) 0.3 ?Stage: Category/Stage III ?Width: (cm) 0.3 ?Depth: (cm) 0.2 ?Volume: (cm?) 0.014 ?Character of Wound/Ulcer Post Debridement: Stable ?Post Procedure Diagnosis ?Same as Pre-procedure ?Electronic Signature(s) ?Signed: 08/19/2021 4:47:54 PM By: Worthy Keeler  PA-C ?Signed: 08/19/2021 4:48:33 PM By: Deon Pilling RN, BSN ?Entered By: Deon Pilling on 08/19/2021 09:42:56 ?-------------------------------------------------------------------------------- ?Debridement Details ?Patient Name: ?Date of Service: ?Jason Mcpherson, Jason RSHA [place] W. 08/19/2021 9:00 A M ?Medical Record Number: [id number] ?Patient Account Number: [id number] ?Date of Birth/Sex: ?Treating RN: ?[birth year] (81 y.o. M) Rolin Barry, Bobbi ?Primary Care Provider: SILV A ZA PA TA, EDWIN ?Other Clinician: ?Referring Provider: ?Treating Provider/Extender: Worthy Keeler ?SILV A ZA PA TA, EDWIN ?Weeks in Treatment: 0 ?Debridement Performed for Assessment: Wound #1 Left Gluteus ?Performed By: Physician Worthy Keeler, PA ?Debridement Type: Debridement ?Level of Consciousness (Pre-procedure): Awake and Alert ?Pre-procedure Verification/Time Out Yes - 09:35 ?Taken: ?Start Time: 09:36 ?Pain Control: Lidocaine 5% topical ointment ?T Area Debrided (L x W): ?otal 5.3 (cm) x 4.8 (cm) = 25.44 (cm?) ?Tissue and other material debrided: ?Viable, Non-Viable, Slough, Subcutaneous, Skin: Dermis , Skin: Epidermis, Fibrin/Exudate, Slough ?Level: Skin/Subcutaneous Tissue ?Debridement Description: Excisional ?Instrument: Curette ?Bleeding: Minimum ?Hemostasis Achieved: Pressure ?End Time: 09:43 ?Procedural Pain: 0 ?Post Procedural Pain: 0 ?Response to Treatment: Procedure was tolerated well ?Level of Consciousness (Post- Awake and Alert ?procedure): ?Post Debridement Measurements of Total Wound ?Length: (cm) 5.3 ?Stage: Category/Stage III ?Width: (cm) 4.8 ?Depth: (cm) 0.2 ?Volume: (cm?) 3.996 ?Character of Wound/Ulcer Post Debridement: Requires Further Debridement ?Post Procedure Diagnosis ?Same as Pre-procedure ?Electronic Signature(s) ?Signed: 08/19/2021 4:47:54 PM By: Worthy Keeler PA-C ?Signed: 08/19/2021 4:48:33 PM By: Deon Pilling RN, BSN ?Entered By: Deon Pilling on 08/19/2021  09:43:55 ?-------------------------------------------------------------------------------- ?HPI Details ?Patient Name: ?Date of Service: ?Jason Mcpherson, Jason RSHA [place] W. 08/19/2021 9:00 A M ?Medical Record Number: [id number] ?Patient Account Number: [id number] ?Date of Birth/Sex: ?Treating RN: ?1940/06/18 (81 y.o. M) Rolin Barry, Bobbi ?Primary Care Provider: SILV A ZA PA TA, EDWIN ?Other Clinician: ?Referring Provider: ?Treating Provider/Extender: Worthy Keeler ?SILV A ZA PA TA, EDWIN ?Weeks in Treatment: 0 ?History of Present Illness ?HPI Description: 08-19-2021 upon evaluation today patient appears to be doing somewhat  poorly in regard to wounds over the left and right gluteal region. The ?left is much larger than the right. With that being said he was seeing recently in the ER on 08-10-2021 for chief complaint of diarrhea. He over the last month ?has been having issues since being discharged from [facility]. Is been it was worse over the past 4 days leading up to the 10th. ?With that being said he was going to the bathroom about every hour this probably worsened things from the standpoint of the wounds. With that being said he ?is currently been using bacitracin for the wounds to help care for it. He does sleep in a recliner and he also sits in the recliner most of the days. With that being ?said I did inquire about how often he gets up to move around the patient tells me and his wife concurs maybe every 3 hours or so. I think this is a big part of ?the issue coupled with sleep in the recliner is that he is putting pressure on this area he is also been using a foam doughnut which is really not ideal to be ?perfectly honest. ?Patient does have a history of hypertension as well as generalized muscle weakness which is contributing to this he is able to get around and walk however ?which is good news. He does have diabetes as well most recent hemoglobin A1c was 7.0. That was in March 2023. ?Electronic  Signature(s) ?Signed: 08/19/2021 10:13:33 AM By: Worthy Keeler PA-C ?Entered By: Worthy Keeler on 08/19/2021 10:13:33 ?-------------------------------------------------------------------------------- ?Physical Exam Details ?Patient Name: Date of Service: ?Jason Mcpherson, Jason RSHA [place] W. 08/19/2021 9:00 A M ?Medical Record Number: [id number] ?Patient Account Number: [id number] ?Date of Birth/Sex: Treating RN: ?[birth year] (81 y.o. M) Rolin Barry, Bobbi ?Primary Care Provider: SILV A ZA PA TA, EDWIN Other Clinician: ?Referring Provider: ?Treating Provider/Extender: Worthy Keeler ?SILV A ZA PA TA, EDWIN ?Weeks in Treatment: 0 ?Constitutional ?supine blood pressure is within target range for patient.Marland Kitchen respirations regular, non-labored and within target range for patient.Marland Kitchen temperature within target range for ?patient.. Well-nourished and well-hydrated in no acute distress. ?Eyes ?conjunctiva clear no eyelid edema noted. pupils equal round and reactive to light and accommodation. ?Ears, Nose, Mouth, and Throat ?no gross abnormality of ear auricles or external auditory canals. normal hearing noted during conversation. mucus membranes moist. ?Respiratory ?normal breathing without difficulty. ?Musculoskeletal ?Patient unable to walk without assistance. no significant deformity or arthritic changes, no loss or range of motion, no clubbing. ?Psychiatric ?this patient is able to make decisions and demonstrates good insight into disease process. Alert and Oriented x 3. pleasant and cooperative. ?Notes ?Upon inspection patient's wound bed actually showed signs of good granulation and epithelization at this point. Fortunately I do not see any signs of active ?infection locally nor systemically which is great news and overall I am extremely pleased with where we stand today. He did have a little small area of necrotic ?tissue and some minimal slough noted I did perform debridement to clear this away and the patient tolerated that today without  complication. Postdebridement ?wound bed appears to be doing much better which is great news. I do feel like this has a good chance to heal if he can keep pressure off. I think that is going ?to be the biggest hindrance. I did have a long discussion with him today about what he nee

## 2021-08-19 NOTE — Progress Notes (Signed)
Jason Mcpherson. ([id number]) ?Visit Report for 08/19/2021 ?Allergy List Details ?Patient Name: Date of Service: ?Jason Mcpherson Mcpherson, [name] 08/19/2021 9:00 A M ?Medical Record Number: [id number] ?Patient Account Number: [id number] ?Date of Birth/Sex: Treating RN: ?1940/07/20 (81 y.o. M) Jason Mcpherson ?Primary Care Jason Mcpherson: Jason Mcpherson Other Clinician: ?Referring Jason Mcpherson: ?Treating Jason Mcpherson/Extender: Jason Mcpherson ?Jason Mcpherson ?Weeks in Treatment: 0 ?Allergies ?Active Allergies ?No Known Drug Allergies ?Allergy Notes ?Electronic Signature(s) ?Signed: 08/19/2021 4:48:33 PM By: Jason Pilling RN, BSN ?Entered By: Jason Mcpherson on 08/18/2021 16:48:02 ?-------------------------------------------------------------------------------- ?Arrival Information Details ?Patient Name: Date of Service: ?Jason Mcpherson Mcpherson, [name] 08/19/2021 9:00 A M ?Medical Record Number: [id number] ?Patient Account Number: [id number] ?Date of Birth/Sex: Treating RN: ?October 16, 1940 (81 y.o. M) Jason Mcpherson ?Primary Care Jason Mcpherson: Jason Mcpherson Other Clinician: ?Referring Jason Mcpherson: ?Treating Jason Mcpherson/Extender: Jason Mcpherson ?Jason Mcpherson ?Weeks in Treatment: 0 ?Visit Information ?Patient Arrived: Wheel Chair ?Arrival Time: 08:46 ?Accompanied By: wife ?Transfer Assistance: Manual ?Patient Identification Verified: Yes ?Secondary Verification Process Completed: Yes ?Patient Requires Transmission-Based Precautions: No ?Patient Has Alerts: No ?Electronic Signature(s) ?Signed: 08/19/2021 4:48:33 PM By: Jason Pilling RN, BSN ?Entered By: Jason Mcpherson on 08/19/2021 08:48:52 ?-------------------------------------------------------------------------------- ?Clinic Level of Care Assessment Details ?Patient Name: Date of Service: ?Jason Mcpherson Mcpherson, [name] 08/19/2021 9:00 A M ?Medical Record Number: [id number] ?Patient Account Number: [id number] ?Date of Birth/Sex: Treating RN: ?08/22/1940 (81 y.o. M) Jason Mcpherson ?Primary Care Jason Mcpherson:  Jason Mcpherson Other Clinician: ?Referring Jason Mcpherson: ?Treating Jason Mcpherson/Extender: Jason Mcpherson ?Jason Mcpherson ?Weeks in Treatment: 0 ?Clinic Level of Care Assessment Items ?TOOL 1 Quantity Score ?X- 1 0 ?Use when EandM and Procedure is performed on INITIAL visit ?ASSESSMENTS - Nursing Assessment / Reassessment ?X- 1 20 ?General Physical Exam (combine w/ comprehensive assessment (listed just below) when performed on new pt. evals) ?X- 1 25 ?Comprehensive Assessment (HX, ROS, Risk Assessments, Wounds Hx, etc.) ?ASSESSMENTS - Wound and Skin Assessment / Reassessment ?X- 1 10 ?Dermatologic / Skin Assessment (not related to wound area) ?ASSESSMENTS - Ostomy and/or Continence Assessment and Care ?_0  - 0 ?Incontinence Assessment and Management ?_1  - 0 ?Ostomy Care Assessment and Management (repouching, etc.) ?PROCESS - Coordination of Care ?_2  - 0 ?Simple Patient / Family Education for ongoing care ?X- 1 20 ?Complex (extensive) Patient / Family Education for ongoing care ?X- 1 10 ?Staff obtains Consents, Records, T Results / Process Orders ?est ?X- 1 10 ?Staff telephones HHA, Nursing Homes / Clarify orders / etc ?_3  - 0 ?Routine Transfer to another Facility (non-emergent condition) ?_4  - 0 ?Routine Hospital Admission (non-emergent condition) ?X- 1 15 ?New Admissions / Biomedical engineer / Ordering NPWT Apligraf, etc. ?, ?_5  - 0 ?Emergency Hospital Admission (emergent condition) ?PROCESS - Special Needs ?_6  - 0 ?Pediatric / Minor Patient Management ?_7  - 0 ?Isolation Patient Management ?_8  - 0 ?Hearing / Language / Visual special needs ?_9  - 0 ?Assessment of Community assistance (transportation, D/C planning, etc.) ?_10  - 0 ?Additional assistance / Altered mentation ?X- 1 15 ?Support Surface(s) Assessment (bed, cushion, seat, etc.) ?INTERVENTIONS - Miscellaneous ?_11  - 0 ?External ear exam ?_12  - 0 ?Patient Transfer (multiple staff / Civil Service fast streamer / Similar devices) ?_13  - 0 ?Simple Staple / Suture  removal (25 or less) ?_14  - 0 ?Complex Staple / Suture removal (26 or more) ?_15  - 0 ?Hypo/Hyperglycemic Management (do not check if billed separately) ?_16  -  0 ?Ankle / Brachial Index (ABI) - do not check if billed separately ?Has the patient been seen at the hospital within the last three years: Yes ?Total Score: 125 ?Level Of Care: New/Established - Level 4 ?Electronic Signature(s) ?Signed: 08/19/2021 4:48:33 PM By: Jason Pilling RN, BSN ?Entered By: Jason Mcpherson on 08/19/2021 09:41:39 ?-------------------------------------------------------------------------------- ?Encounter Discharge Information Details ?Patient Name: ?Date of Service: ?Jason Mcpherson, [name] 08/19/2021 9:00 A M ?Medical Record Number: [id number] ?Patient Account Number: [id number] ?Date of Birth/Sex: ?Treating RN: ?July 20, 1940 (81 y.o. M) Jason Mcpherson ?Primary Care Jason Mcpherson: Jason Mcpherson ?Other Clinician: ?Referring Jason Mcpherson: ?Treating Jason Mcpherson/Extender: Jason Mcpherson ?Jason Mcpherson ?Weeks in Treatment: 0 ?Encounter Discharge Information Items Post Procedure Vitals ?Discharge Condition: Stable ?Temperature (F): 97.9 ?Ambulatory Status: Wheelchair ?Pulse (bpm): 64 ?Discharge Destination: Home ?Respiratory Rate (breaths/min): 16 ?Transportation: Private Auto ?Blood Pressure (mmHg): 105/60 ?Accompanied By: wife ?Schedule Follow-up Appointment: Yes ?Clinical Summary of Care: ?Electronic Signature(s) ?Signed: 08/19/2021 4:48:33 PM By: Jason Pilling RN, BSN ?Entered By: Jason Mcpherson on 08/19/2021 09:52:36 ?-------------------------------------------------------------------------------- ?Lower Extremity Assessment Details ?Patient Name: ?Date of Service: ?Jason Mcpherson, [name] 08/19/2021 9:00 A M ?Medical Record Number: [id number] ?Patient Account Number: [id number] ?Date of Birth/Sex: ?Treating RN: ?Sep 24, 1940 (81 y.o. M) Jason Mcpherson ?Primary Care Petrona Wyeth: Jason Mcpherson ?Other Clinician: ?Referring Kenya Shiraishi: ?Treating  Giliana Vantil/Extender: Jason Mcpherson ?Jason Mcpherson ?Weeks in Treatment: 0 ?Electronic Signature(s) ?Signed: 08/19/2021 4:48:33 PM By: Jason Pilling RN, BSN ?Entered By: Jason Mcpherson on 08/19/2021 08:55:21 ?-------------------------------------------------------------------------------- ?Multi-Disciplinary Care Plan Details ?Patient Name: ?Date of Service: ?Jason Mcpherson, [name] 08/19/2021 9:00 A M ?Medical Record Number: [id number] ?Patient Account Number: [id number] ?Date of Birth/Sex: ?Treating RN: ?Dec 27, 1940 (81 y.o. M) Jason Mcpherson ?Primary Care Shaela Boer: Jason Mcpherson ?Other Clinician: ?Referring Aryana Wonnacott: ?Treating Eydie Wormley/Extender: Jason Mcpherson ?Jason Mcpherson ?Weeks in Treatment: 0 ?Active Inactive ?Orientation to the Wound Care Program ?Nursing Diagnoses: ?Knowledge deficit related to the wound healing center program ?Goals: ?Patient/caregiver will verbalize understanding of the [place] ?Date Initiated: 08/19/2021 ?Target Resolution Date: 09/04/2021 ?Goal Status: Active ?Interventions: ?Provide education on orientation to the wound center ?Notes: ?Pain, Acute or Chronic ?Nursing Diagnoses: ?Pain, acute or chronic: actual or potential ?Potential alteration in comfort, pain ?Goals: ?Patient will verbalize adequate pain control and receive pain control interventions during procedures as needed ?Date Initiated: 08/19/2021 ?Target Resolution Date: 09/04/2021 ?Goal Status: Active ?Patient/caregiver will verbalize comfort level met ?Date Initiated: 08/19/2021 ?Target Resolution Date: 09/04/2021 ?Goal Status: Active ?Interventions: ?Complete pain assessment as per visit requirements ?Encourage patient to take pain medications as prescribed ?Provide education on pain management ?Reposition patient for comfort ?Treatment Activities: ?Administer pain control measures as ordered : 08/19/2021 ?Notes: ?Pressure ?Nursing Diagnoses: ?Potential for impaired tissue integrity related to  pressure, friction, moisture, and shear ?Goals: ?Patient will remain free of pressure ulcers ?Date Initiated: 08/19/2021 ?Target Resolution Date: 09/04/2021 ?Goal Status: Active ?Patient/caregiver will verbalize understand

## 2021-08-19 NOTE — Progress Notes (Signed)
NASIRE, REALI. ([id number]) ?Visit Report for 08/19/2021 ?Abuse Risk Screen Details ?Patient Name: Date of Service: ?Jason Mcpherson, Jason RSHA [place] W. 08/19/2021 9:00 A M ?Medical Record Number: [id number] ?Patient Account Number: [id number] ?Date of Birth/Sex: Treating RN: ?11/29/1940 (81 y.o. M) Rolin Barry, Jason Mcpherson ?Primary Care Nadine Ryle: SILV A ZA PA TA, EDWIN Other Clinician: ?Referring Natalia Wittmeyer: ?Treating Ari Bernabei/Extender: Worthy Keeler ?SILV A ZA PA TA, EDWIN ?Weeks in Treatment: 0 ?Abuse Risk Screen Items ?Answer ?ABUSE RISK SCREEN: ?Has anyone close to you tried to hurt or harm you recentlyo No ?Do you feel uncomfortable with anyone in your familyo No ?Has anyone forced you do things that you didnt want to doo No ?Electronic Signature(s) ?Signed: 08/19/2021 4:48:33 PM By: Deon Pilling RN, BSN ?Entered By: Deon Pilling on 08/19/2021 08:52:38 ?-------------------------------------------------------------------------------- ?Activities of Daily Living Details ?Patient Name: Date of Service: ?Jason Mcpherson, Jason RSHA [place] W. 08/19/2021 9:00 A M ?Medical Record Number: [id number] ?Patient Account Number: [id number] ?Date of Birth/Sex: Treating RN: ?November 10, 1940 (81 y.o. M) Rolin Barry, Jason Mcpherson ?Primary Care Dyani Babel: SILV A ZA PA TA, EDWIN Other Clinician: ?Referring Corryn Madewell: ?Treating Koy Lamp/Extender: Worthy Keeler ?SILV A ZA PA TA, EDWIN ?Weeks in Treatment: 0 ?Activities of Daily Living Items ?Answer ?Activities of Daily Living (Please select one for each item) ?Drive Automobile Not Able ?T Medications ?ake Completely Able ?Use T elephone Completely Able ?Care for Appearance Need Assistance ?Use T oilet Need Assistance ?Bath / Shower Need Assistance ?Dress Self Need Assistance ?Feed Self Completely Able ?Walk Need Assistance ?Get In / Out Bed Need Assistance ?Housework Not Able ?Prepare Meals Not Able ?Handle Money Not Able ?Shop for Self Not Able ?Electronic Signature(s) ?Signed: 08/19/2021 4:48:33 PM By: Deon Pilling RN, BSN ?Entered By:  Deon Pilling on 08/19/2021 08:53:02 ?-------------------------------------------------------------------------------- ?Education Screening Details ?Patient Name: ?Date of Service: ?Jason Mcpherson, Jason RSHA [place] W. 08/19/2021 9:00 A M ?Medical Record Number: [id number] ?Patient Account Number: [id number] ?Date of Birth/Sex: ?Treating RN: ?1940-09-27 (81 y.o. M) Rolin Barry, Jason Mcpherson ?Primary Care Giulietta Prokop: SILV A ZA PA TA, EDWIN ?Other Clinician: ?Referring Nancye Grumbine: ?Treating Cobe Viney/Extender: Worthy Keeler ?SILV A ZA PA TA, EDWIN ?Weeks in Treatment: 0 ?Primary Learner Assessed: Patient ?Learning Preferences/Education Level/Primary Language ?Learning Preference: Explanation, Demonstration, Printed Material ?Highest Education Level: High School ?Preferred Language: English ?Cognitive Barrier ?Language Barrier: No ?Translator Needed: No ?Memory Deficit: No ?Emotional Barrier: No ?Cultural/Religious Beliefs Affecting Medical Care: No ?Physical Barrier ?Impaired Vision: Yes Glasses ?Impaired Hearing: Yes Hearing Aid, both ears ?Decreased Hand dexterity: No ?Knowledge/Comprehension ?Knowledge Level: High ?Comprehension Level: High ?Ability to understand written instructions: High ?Ability to understand verbal instructions: High ?Motivation ?Anxiety Level: Calm ?Cooperation: Cooperative ?Education Importance: Acknowledges Need ?Interest in Health Problems: Asks Questions ?Perception: Coherent ?Willingness to Engage in Self-Management High ?Activities: ?Readiness to Engage in Self-Management High ?Activities: ?Electronic Signature(s) ?Signed: 08/19/2021 4:48:33 PM By: Deon Pilling RN, BSN ?Entered By: Deon Pilling on 08/19/2021 08:53:54 ?-------------------------------------------------------------------------------- ?Fall Risk Assessment Details ?Patient Name: ?Date of Service: ?Jason Mcpherson, Jason RSHA [place] W. 08/19/2021 9:00 A M ?Medical Record Number: [id number] ?Patient Account Number: [id number] ?Date of Birth/Sex: ?Treating RN: ?Aug 16, 1940 (81  y.o. M) Rolin Barry, Jason Mcpherson ?Primary Care Ameilia Rattan: SILV A ZA PA TA, EDWIN ?Other Clinician: ?Referring Sharesa Kemp: ?Treating Leomar Westberg/Extender: Worthy Keeler ?SILV A ZA PA TA, EDWIN ?Weeks in Treatment: 0 ?Fall Risk Assessment Items ?Have you had 2 or more falls in the last 12 monthso 0 Yes ?Have you had any fall that resulted in injury in the last 12 monthso 0 No ?FALLS RISK  SCREEN ?History of falling - immediate or within 3 months 25 Yes ?Secondary diagnosis (Do you have 2 or more medical diagnoseso) 0 No ?Ambulatory aid ?None/bed rest/wheelchair/nurse 0 Yes ?Crutches/cane/walker 15 Yes ?Furniture 0 No ?Intravenous therapy Access/Saline/Heparin Lock 0 No ?Gait/Transferring ?Normal/ bed rest/ wheelchair 0 No ?Weak (short steps with or without shuffle, stooped but able to lift head while walking, may seek 10 Yes ?support from furniture) ?Impaired (short steps with shuffle, may have difficulty arising from chair, head down, impaired 0 No ?balance) ?Mental Status ?Oriented to own ability 0 Yes ?Electronic Signature(s) ?Signed: 08/19/2021 4:48:33 PM By: Deon Pilling RN, BSN ?Entered By: Deon Pilling on 08/19/2021 08:54:19 ?-------------------------------------------------------------------------------- ?Foot Assessment Details ?Patient Name: ?Date of Service: ?Jason Mcpherson, Jason RSHA [place] W. 08/19/2021 9:00 A M ?Medical Record Number: [id number] ?Patient Account Number: [id number] ?Date of Birth/Sex: ?Treating RN: ?[birth year] (81 y.o. M) Rolin Barry, Jason Mcpherson ?Primary Care Neamiah Sciarra: SILV A ZA PA TA, EDWIN ?Other Clinician: ?Referring Brittanya Winburn: ?Treating Millard Bautch/Extender: Worthy Keeler ?SILV A ZA PA TA, EDWIN ?Weeks in Treatment: 0 ?Foot Assessment Items ?Site Locations ?+ = Sensation present, - = Sensation absent, C = Callus, U = Ulcer ?R = Redness, W = Warmth, M = Maceration, PU = Pre-ulcerative lesion ?F = Fissure, S = Swelling, D = Dryness ?Assessment ?Right: Left: ?Other Deformity: No No ?Prior Foot Ulcer: No No ?Prior Amputation: No  No ?Charcot Joint: No No ?Ambulatory Status: Ambulatory With Help ?Assistance Device: Wheelchair ?Gait: Unsteady ?Notes ?NO BLE wounds. ?Electronic Signature(s) ?Signed: 08/19/2021 4:48:33 PM By: Deon Pilling RN, BSN ?Entered By: Deon Pilling on 08/19/2021 08:55:11 ?-------------------------------------------------------------------------------- ?Nutrition Risk Screening Details ?Patient Name: ?Date of Service: ?Jason Mcpherson, Jason RSHA [place] W. 08/19/2021 9:00 A M ?Medical Record Number: [id number] ?Patient Account Number: [id number] ?Date of Birth/Sex: ?Treating RN: ?1940-10-23 (81 y.o. M) Rolin Barry, Jason Mcpherson ?Primary Care Charlotta Lapaglia: SILV A ZA PA TA, EDWIN ?Other Clinician: ?Referring Ravon Mortellaro: ?Treating Pratham Cassatt/Extender: Worthy Keeler ?SILV A ZA PA TA, EDWIN ?Weeks in Treatment: 0 ?Height (in): 67 ?Weight (lbs): 150 ?Body Mass Index (BMI): 23.5 ?Nutrition Risk Screening Items ?Score Screening ?NUTRITION RISK SCREEN: ?I have an illness or condition that made me change the kind and/or amount of food I eat 2 Yes ?I eat fewer than two meals per day 0 No ?I eat few fruits and vegetables, or milk products 0 No ?I have three or more drinks of beer, liquor or wine almost every day 0 No ?I have tooth or mouth problems that make it hard for me to eat 0 No ?I don't always have enough money to buy the food I need 0 No ?I eat alone most of the time 0 No ?I take three or more different prescribed or over-the-counter drugs a day 1 Yes ?Without wanting to, I have lost or gained 10 pounds in the last six months 0 No ?I am not always physically able to shop, cook and/or feed myself 2 Yes ?Nutrition Protocols ?Good Risk Protocol ?Provide education on elevated blood ?Moderate Risk Protocol 0 sugars and impact on wound healing, ?as applicable ?High Risk Proctocol ?Risk Level: Moderate Risk ?Score: 5 ?Electronic Signature(s) ?Signed: 08/19/2021 4:48:33 PM By: Deon Pilling RN, BSN ?Entered By: Deon Pilling on 08/19/2021 08:54:35 ?

## 2021-08-24 DIAGNOSIS — M160 Bilateral primary osteoarthritis of hip: Secondary | ICD-10-CM | POA: Diagnosis not present

## 2021-08-24 DIAGNOSIS — I129 Hypertensive chronic kidney disease with stage 1 through stage 4 chronic kidney disease, or unspecified chronic kidney disease: Secondary | ICD-10-CM | POA: Diagnosis not present

## 2021-08-24 DIAGNOSIS — E1122 Type 2 diabetes mellitus with diabetic chronic kidney disease: Secondary | ICD-10-CM | POA: Diagnosis not present

## 2021-08-24 DIAGNOSIS — S30810D Abrasion of lower back and pelvis, subsequent encounter: Secondary | ICD-10-CM | POA: Diagnosis not present

## 2021-08-24 DIAGNOSIS — L89323 Pressure ulcer of left buttock, stage 3: Secondary | ICD-10-CM | POA: Diagnosis not present

## 2021-08-24 DIAGNOSIS — E1151 Type 2 diabetes mellitus with diabetic peripheral angiopathy without gangrene: Secondary | ICD-10-CM | POA: Diagnosis not present

## 2021-08-24 DIAGNOSIS — M48061 Spinal stenosis, lumbar region without neurogenic claudication: Secondary | ICD-10-CM | POA: Diagnosis not present

## 2021-08-24 DIAGNOSIS — E1142 Type 2 diabetes mellitus with diabetic polyneuropathy: Secondary | ICD-10-CM | POA: Diagnosis not present

## 2021-08-24 DIAGNOSIS — L89313 Pressure ulcer of right buttock, stage 3: Secondary | ICD-10-CM | POA: Diagnosis not present

## 2021-08-24 DIAGNOSIS — D631 Anemia in chronic kidney disease: Secondary | ICD-10-CM | POA: Diagnosis not present

## 2021-08-24 DIAGNOSIS — M1712 Unilateral primary osteoarthritis, left knee: Secondary | ICD-10-CM | POA: Diagnosis not present

## 2021-08-24 DIAGNOSIS — N179 Acute kidney failure, unspecified: Secondary | ICD-10-CM | POA: Diagnosis not present

## 2021-08-24 DIAGNOSIS — H9193 Unspecified hearing loss, bilateral: Secondary | ICD-10-CM | POA: Diagnosis not present

## 2021-08-24 DIAGNOSIS — E782 Mixed hyperlipidemia: Secondary | ICD-10-CM | POA: Diagnosis not present

## 2021-08-24 DIAGNOSIS — E1169 Type 2 diabetes mellitus with other specified complication: Secondary | ICD-10-CM | POA: Diagnosis not present

## 2021-08-24 DIAGNOSIS — K219 Gastro-esophageal reflux disease without esophagitis: Secondary | ICD-10-CM | POA: Diagnosis not present

## 2021-08-24 DIAGNOSIS — S72112D Displaced fracture of greater trochanter of left femur, subsequent encounter for closed fracture with routine healing: Secondary | ICD-10-CM | POA: Diagnosis not present

## 2021-08-24 DIAGNOSIS — Z7982 Long term (current) use of aspirin: Secondary | ICD-10-CM | POA: Diagnosis not present

## 2021-08-24 DIAGNOSIS — Z89422 Acquired absence of other left toe(s): Secondary | ICD-10-CM | POA: Diagnosis not present

## 2021-08-24 DIAGNOSIS — N1831 Chronic kidney disease, stage 3a: Secondary | ICD-10-CM | POA: Diagnosis not present

## 2021-08-24 DIAGNOSIS — Z9181 History of falling: Secondary | ICD-10-CM | POA: Diagnosis not present

## 2021-08-25 DIAGNOSIS — K219 Gastro-esophageal reflux disease without esophagitis: Secondary | ICD-10-CM | POA: Diagnosis not present

## 2021-08-25 DIAGNOSIS — Z7982 Long term (current) use of aspirin: Secondary | ICD-10-CM | POA: Diagnosis not present

## 2021-08-25 DIAGNOSIS — E1122 Type 2 diabetes mellitus with diabetic chronic kidney disease: Secondary | ICD-10-CM | POA: Diagnosis not present

## 2021-08-25 DIAGNOSIS — S30810D Abrasion of lower back and pelvis, subsequent encounter: Secondary | ICD-10-CM | POA: Diagnosis not present

## 2021-08-25 DIAGNOSIS — H9193 Unspecified hearing loss, bilateral: Secondary | ICD-10-CM | POA: Diagnosis not present

## 2021-08-25 DIAGNOSIS — Z9181 History of falling: Secondary | ICD-10-CM | POA: Diagnosis not present

## 2021-08-25 DIAGNOSIS — M160 Bilateral primary osteoarthritis of hip: Secondary | ICD-10-CM | POA: Diagnosis not present

## 2021-08-25 DIAGNOSIS — M1712 Unilateral primary osteoarthritis, left knee: Secondary | ICD-10-CM | POA: Diagnosis not present

## 2021-08-25 DIAGNOSIS — M48061 Spinal stenosis, lumbar region without neurogenic claudication: Secondary | ICD-10-CM | POA: Diagnosis not present

## 2021-08-25 DIAGNOSIS — E1151 Type 2 diabetes mellitus with diabetic peripheral angiopathy without gangrene: Secondary | ICD-10-CM | POA: Diagnosis not present

## 2021-08-25 DIAGNOSIS — E782 Mixed hyperlipidemia: Secondary | ICD-10-CM | POA: Diagnosis not present

## 2021-08-25 DIAGNOSIS — E1142 Type 2 diabetes mellitus with diabetic polyneuropathy: Secondary | ICD-10-CM | POA: Diagnosis not present

## 2021-08-25 DIAGNOSIS — N179 Acute kidney failure, unspecified: Secondary | ICD-10-CM | POA: Diagnosis not present

## 2021-08-25 DIAGNOSIS — S72112D Displaced fracture of greater trochanter of left femur, subsequent encounter for closed fracture with routine healing: Secondary | ICD-10-CM | POA: Diagnosis not present

## 2021-08-25 DIAGNOSIS — E1169 Type 2 diabetes mellitus with other specified complication: Secondary | ICD-10-CM | POA: Diagnosis not present

## 2021-08-25 DIAGNOSIS — N1831 Chronic kidney disease, stage 3a: Secondary | ICD-10-CM | POA: Diagnosis not present

## 2021-08-25 DIAGNOSIS — D631 Anemia in chronic kidney disease: Secondary | ICD-10-CM | POA: Diagnosis not present

## 2021-08-25 DIAGNOSIS — Z89422 Acquired absence of other left toe(s): Secondary | ICD-10-CM | POA: Diagnosis not present

## 2021-08-25 DIAGNOSIS — I129 Hypertensive chronic kidney disease with stage 1 through stage 4 chronic kidney disease, or unspecified chronic kidney disease: Secondary | ICD-10-CM | POA: Diagnosis not present

## 2021-08-26 ENCOUNTER — Encounter (HOSPITAL_BASED_OUTPATIENT_CLINIC_OR_DEPARTMENT_OTHER): Payer: Medicare HMO | Admitting: Physician Assistant

## 2021-08-26 DIAGNOSIS — L89323 Pressure ulcer of left buttock, stage 3: Secondary | ICD-10-CM | POA: Diagnosis not present

## 2021-08-26 DIAGNOSIS — M6281 Muscle weakness (generalized): Secondary | ICD-10-CM | POA: Diagnosis not present

## 2021-08-26 DIAGNOSIS — E1122 Type 2 diabetes mellitus with diabetic chronic kidney disease: Secondary | ICD-10-CM | POA: Diagnosis not present

## 2021-08-26 DIAGNOSIS — E11622 Type 2 diabetes mellitus with other skin ulcer: Secondary | ICD-10-CM | POA: Diagnosis not present

## 2021-08-26 DIAGNOSIS — L89313 Pressure ulcer of right buttock, stage 3: Secondary | ICD-10-CM | POA: Diagnosis not present

## 2021-08-26 DIAGNOSIS — I129 Hypertensive chronic kidney disease with stage 1 through stage 4 chronic kidney disease, or unspecified chronic kidney disease: Secondary | ICD-10-CM | POA: Diagnosis not present

## 2021-08-26 DIAGNOSIS — N183 Chronic kidney disease, stage 3 unspecified: Secondary | ICD-10-CM | POA: Diagnosis not present

## 2021-08-26 NOTE — Progress Notes (Addendum)
MELBOURNE, JAKUBIAK ([id number]) Visit Report for 08/26/2021 Arrival Information Details Patient Name: Date of Service: [place] NES, [place]. 08/26/2021 12:45 PM Medical Record Number: [id number] Patient Account Number: [id number] Date of Birth/Sex: Treating RN: [birth year] (81 y.o. Lorette Ang, Meta.Reding Primary Care Courteney Alderete: Patrecia Pour, Christean Grief Other Clinician: Referring Poppy Mcafee: Treating Tawonda Legaspi/Extender: Allegra Lai Weeks in Treatment: 1 Visit Information History Since Last Visit Added or deleted any medications: No Patient Arrived: Wheel Chair Any new allergies or adverse reactions: No Arrival Time: 12:45 Had a fall or experienced change in No Accompanied By: wife activities of daily living that may affect Transfer Assistance: Manual risk of falls: Patient Identification Verified: Yes Signs or symptoms of abuse/neglect since last visito No Secondary Verification Process Completed: Yes Hospitalized since last visit: No Patient Requires Transmission-Based Precautions: No Implantable device outside of the clinic excluding No Patient Has Alerts: No cellular tissue based products placed in the center since last visit: Has Dressing in Place as Prescribed: Yes Pain Present Now: No Electronic Signature(s) Signed: 08/26/2021 3:21:12 PM By: Deon Pilling RN, BSN Entered By: Deon Pilling on 08/26/2021 12:45:19 -------------------------------------------------------------------------------- Encounter Discharge Information Details Patient Name: Date of Service: [name], [place] W. 08/26/2021 12:45 PM Medical Record Number: [id number] Patient Account Number: [id number] Date of Birth/Sex: Treating RN: [birth year] (81 y.o. Hessie Diener Primary Care Vershawn Westrup: Patrecia Pour, Christean Grief Other Clinician: Referring Doninique Lwin: Treating Reyn Faivre/Extender: Allegra Lai Weeks in Treatment: 1 Encounter Discharge Information Items Post Procedure  Vitals Discharge Condition: Stable Temperature (F): 98.1 Ambulatory Status: Wheelchair Pulse (bpm): 83 Discharge Destination: Home Respiratory Rate (breaths/min): 20 Transportation: Private Auto Blood Pressure (mmHg): 118/74 Accompanied By: wife Schedule Follow-up Appointment: Yes Clinical Summary of Care: Electronic Signature(s) Signed: 08/26/2021 3:21:12 PM By: Deon Pilling RN, BSN Entered By: Deon Pilling on 08/26/2021 13:08:17 -------------------------------------------------------------------------------- Lower Extremity Assessment Details Patient Name: Date of Service: [facility] NES, [place] RSHA [place] W. 08/26/2021 12:45 PM Medical Record Number: [id number] Patient Account Number: [id number] Date of Birth/Sex: Treating RN: 05-20-1940 (81 y.o. Hessie Diener Primary Care Alzena Gerber: Patrecia Pour, Christean Grief Other Clinician: Referring Rubie Ficco: Treating Leldon Steege/Extender: Allegra Lai Weeks in Treatment: 1 Electronic Signature(s) Signed: 08/26/2021 3:21:12 PM By: Deon Pilling RN, BSN Entered By: Deon Pilling on 08/26/2021 12:45:49 -------------------------------------------------------------------------------- Viola Details Patient Name: Date of Service: [facility] NES, Laura W. 08/26/2021 12:45 PM Medical Record Number: [id number] Patient Account Number: [id number] Date of Birth/Sex: Treating RN: 1940-05-07 (81 y.o. Hessie Diener Primary Care Nazario Russom: Patrecia Pour, Christean Grief Other Clinician: Referring Glynn Freas: Treating Odessia Asleson/Extender: Allegra Lai Weeks in Treatment: 1 Active Inactive Electronic Signature(s) Signed: 10/02/2021 8:59:42 AM By: Deon Pilling RN, BSN Previous Signature: 08/26/2021 3:21:12 PM Version By: Deon Pilling RN, BSN Entered By: Deon Pilling on 10/02/2021 08:59:42 -------------------------------------------------------------------------------- Pain Assessment Details Patient Name: Date of  Service: [name], [place] W. 08/26/2021 12:45 PM Medical Record Number: [id number] Patient Account Number: [id number] Date of Birth/Sex: Treating RN: Aug 10, 1940 (81 y.o. Hessie Diener Primary Care Jacorey Donaway: Patrecia Pour, Christean Grief Other Clinician: Referring Narayan Scull: Treating Cledis Sohn/Extender: Allegra Lai Weeks in Treatment: 1 Active Problems Location of Pain Severity and Description of Pain Patient Has Paino No Site Locations Rate the pain. Rate the pain. Current Pain Level: 0 Pain Management and Medication Current Pain Management: Medication: No Cold Application: No Rest: No Massage: No Activity: No T.E.N.S.: No Heat Application: No Leg drop or  elevation: No Is the Current Pain Management Adequate: Adequate How does your wound impact your activities of daily livingo Sleep: No Bathing: No Appetite: No Relationship With Others: No Bladder Continence: No Emotions: No Bowel Continence: No Work: No Toileting: No Drive: No Dressing: No Hobbies: No Engineer, maintenance) Signed: 08/26/2021 3:21:12 PM By: Deon Pilling RN, BSN Entered By: Deon Pilling on 08/26/2021 12:45:43 -------------------------------------------------------------------------------- Patient/Caregiver Education Details Patient Name: Date of Service: [name], [place] RSHA Cherene Julian 4/26/2023andnbsp12:45 PM Medical Record Number: [id number] Patient Account Number: [id number] Date of Birth/Gender: Treating RN: 17-Jan-1941 (81 y.o. Hessie Diener Primary Care Physician: Patrecia Pour, Christean Grief Other Clinician: Referring Physician: Treating Physician/Extender: Gwenevere Abbot in Treatment: 1 Education Assessment Education Provided To: Patient Education Topics Provided Pain: Handouts: A Guide to Pain Control Methods: Explain/Verbal Responses: Reinforcements needed Electronic Signature(s) Signed: 08/26/2021 3:21:12 PM By: Deon Pilling RN, BSN Entered By:  Deon Pilling on 08/26/2021 13:01:57 -------------------------------------------------------------------------------- Wound Assessment Details Patient Name: Date of Service: [name], [place] W. 08/26/2021 12:45 PM Medical Record Number: [id number] Patient Account Number: [id number] Date of Birth/Sex: Treating RN: [birth year] (81 y.o. Lorette Ang, Meta.Reding Primary Care Charlotte Brafford: Patrecia Pour, Christean Grief Other Clinician: Referring Velita Quirk: Treating Guenther Dunshee/Extender: Allegra Lai Weeks in Treatment: 1 Wound Status Wound Number: 1 Primary Etiology: Pressure Ulcer Wound Location: Left Gluteus Wound Status: Open Wounding Event: Pressure Injury Comorbid History: Anemia, Hypertension, Type II Diabetes, Osteoarthritis Date Acquired: 07/27/2021 Weeks Of Treatment: 1 Clustered Wound: No Photos Wound Measurements Length: (cm) 5 Width: (cm) 4.3 Depth: (cm) 0.2 Area: (cm) 16.886 Volume: (cm) 3.377 % Reduction in Area: 15.5% % Reduction in Volume: 15.5% Epithelialization: None Tunneling: No Undermining: No Wound Description Classification: Category/Stage III Wound Margin: Thickened Exudate Amount: Medium Exudate Type: Serosanguineous Exudate Color: red, brown Foul Odor After Cleansing: No Slough/Fibrino Yes Wound Bed Granulation Amount: Small (1-33%) Exposed Structure Granulation Quality: Pink, Pale Fascia Exposed: No Necrotic Amount: Large (67-100%) Fat Layer (Subcutaneous Tissue) Exposed: No Necrotic Quality: Adherent Slough Tendon Exposed: No Muscle Exposed: No Joint Exposed: No Bone Exposed: No Treatment Notes Wound #1 (Gluteus) Wound Laterality: Left Cleanser Normal Saline Discharge Instruction: Cleanse the wound with Normal Saline prior to applying a clean dressing using gauze sponges, not tissue or cotton balls. Soap and Water Discharge Instruction: May shower and wash wound with dial antibacterial soap and water prior to dressing change. Wound  Cleanser Discharge Instruction: Cleanse the wound with wound cleanser prior to applying a clean dressing using gauze sponges, not tissue or cotton balls. Peri-Wound Care Skin Prep Discharge Instruction: Use skin prep as directed Topical Primary Dressing Promogran Prisma Matrix, 4.34 (sq in) (silver collagen) Discharge Instruction: Moisten collagen with saline or hydrogel Secondary Dressing Zetuvit Plus Silicone Border Dressing 5x5 (in/in) Discharge Instruction: Apply silicone border over primary dressing as directed. Secured With Compression Wrap Compression Stockings Environmental education officer) Signed: 08/26/2021 3:21:12 PM By: Deon Pilling RN, BSN Signed: 08/26/2021 3:30:56 PM By: Rhae Hammock RN Entered By: Rhae Hammock on 08/26/2021 12:55:44 -------------------------------------------------------------------------------- Wound Assessment Details Patient Name: Date of Service: [name], [place] W. 08/26/2021 12:45 PM Medical Record Number: [id number] Patient Account Number: [id number] Date of Birth/Sex: Treating RN: 08-22-1940 (81 y.o. Hessie Diener Primary Care Arleen Bar: Patrecia Pour, Christean Grief Other Clinician: Referring Jordyan Hardiman: Treating Ajanae Virag/Extender: Allegra Lai Weeks in Treatment: 1 Wound Status Wound Number: 2 Primary Etiology: Pressure Ulcer Wound Location: Right Gluteus Wound Status: Open Wounding Event: Pressure Injury Comorbid  History: Anemia, Hypertension, Type II Diabetes, Osteoarthritis Date Acquired: 07/27/2021 Weeks Of Treatment: 1 Clustered Wound: No Photos Wound Measurements Length: (cm) Width: (cm) Depth: (cm) Area: (cm) Volume: (cm) 0 % Reduction in Area: 100% 0 % Reduction in Volume: 100% 0 Epithelialization: Large (67-100%) 0 Tunneling: No 0 Undermining: No Wound Description Classification: Category/Stage III Wound Margin: Thickened Exudate Amount: None Present Foul Odor After Cleansing:  No Slough/Fibrino Yes Wound Bed Granulation Amount: None Present (0%) Exposed Structure Necrotic Amount: None Present (0%) Fascia Exposed: No Fat Layer (Subcutaneous Tissue) Exposed: No Tendon Exposed: No Muscle Exposed: No Joint Exposed: No Bone Exposed: No Electronic Signature(s) Signed: 08/26/2021 3:21:12 PM By: Deon Pilling RN, BSN Signed: 08/26/2021 3:30:56 PM By: Rhae Hammock RN Entered By: Rhae Hammock on 08/26/2021 12:55:16 -------------------------------------------------------------------------------- Vitals Details Patient Name: Date of Service: [name], [place] W. 08/26/2021 12:45 PM Medical Record Number: [id number] Patient Account Number: [id number] Date of Birth/Sex: Treating RN: 1940-05-20 (81 y.o. Lorette Ang, Meta.Reding Primary Care Tahirah Sara: Patrecia Pour, Christean Grief Other Clinician: Referring Milaya Hora: Treating Meliya Mcconahy/Extender: Allegra Lai Weeks in Treatment: 1 Vital Signs Time Taken: 12:45 Temperature (F): 98.1 Height (in): 67 Pulse (bpm): 83 Weight (lbs): 150 Respiratory Rate (breaths/min): 20 Body Mass Index (BMI): 23.5 Blood Pressure (mmHg): 118/74 Reference Range: 80 - 120 mg / dl Electronic Signature(s) Signed: 08/26/2021 3:21:12 PM By: Deon Pilling RN, BSN Entered By: Deon Pilling on 08/26/2021 12:45:33

## 2021-08-26 NOTE — Progress Notes (Addendum)
JENTZEN, MINASYAN. ([id number]) ?Visit Report for 08/26/2021 ?Chief Complaint Document Details ?Patient Name: Date of Service: ?Jason Mcpherson, [place] W. 08/26/2021 12:45 PM ?Medical Record Number: [id number] ?Patient Account Number: [id number] ?Date of Birth/Sex: Treating RN: ?1941/02/14 (81 y.o. M) Rolin Barry, Bobbi ?Primary Care Provider: Patrecia Pour, Christean Grief Other Clinician: ?Referring Provider: ?Treating Provider/Extender: Worthy Keeler ?Patrecia Pour, Edwin ?Weeks in Treatment: 1 ?Information Obtained from: Patient ?Chief Complaint ?Bilateral gluteal pressure ulcers ?Electronic Signature(s) ?Signed: 08/26/2021 1:00:10 PM By: Worthy Keeler PA-C ?Entered By: Worthy Keeler on 08/26/2021 13:00:10 ?-------------------------------------------------------------------------------- ?Debridement Details ?Patient Name: Date of Service: ?Jason Mcpherson, [place] W. 08/26/2021 12:45 PM ?Medical Record Number: [id number] ?Patient Account Number: [id number] ?Date of Birth/Sex: Treating RN: ?April 18, 1941 (81 y.o. M) Rolin Barry, Bobbi ?Primary Care Provider: Patrecia Pour, Christean Grief Other Clinician: ?Referring Provider: ?Treating Provider/Extender: Worthy Keeler ?Patrecia Pour, Edwin ?Weeks in Treatment: 1 ?Debridement Performed for Assessment: Wound #1 Left Gluteus ?Performed By: Physician Worthy Keeler, PA ?Debridement Type: Debridement ?Level of Consciousness (Pre-procedure): Awake and Alert ?Pre-procedure Verification/Time Out Yes - 12:58 ?Taken: ?Start Time: 12:59 ?Pain Control: ?Other : benzocaine 20% ?T Area Debrided (L x W): ?otal 5 (cm) x 4.3 (cm) = 21.5 (cm?) ?Tissue and other material debrided: ?Viable, Non-Viable, Slough, Subcutaneous, Skin: Dermis , Skin: Epidermis, Fibrin/Exudate, Slough ?Level: Skin/Subcutaneous Tissue ?Debridement Description: Excisional ?Instrument: Curette ?Bleeding: Minimum ?Hemostasis Achieved: Pressure ?End Time: 13:03 ?Procedural Pain: 0 ?Post Procedural Pain: 0 ?Response to Treatment: Procedure was tolerated  well ?Level of Consciousness (Post- Awake and Alert ?procedure): ?Post Debridement Measurements of Total Wound ?Length: (cm) 5 ?Stage: Category/Stage III ?Width: (cm) 4.3 ?Depth: (cm) 0.2 ?Volume: (cm?) 3.377 ?Character of Wound/Ulcer Post Debridement: Improved ?Post Procedure Diagnosis ?Same as Pre-procedure ?Electronic Signature(s) ?Signed: 08/26/2021 3:21:12 PM By: Deon Pilling RN, BSN ?Signed: 08/26/2021 4:00:03 PM By: Worthy Keeler PA-C ?Entered By: Deon Pilling on 08/26/2021 13:03:21 ?-------------------------------------------------------------------------------- ?HPI Details ?Patient Name: Date of Service: ?Jason Mcpherson, [place] W. 08/26/2021 12:45 PM ?Medical Record Number: [id number] ?Patient Account Number: [id number] ?Date of Birth/Sex: Treating RN: ?01/18/1941 (81 y.o. M) Rolin Barry, Bobbi ?Primary Care Provider: Patrecia Pour, Christean Grief Other Clinician: ?Referring Provider: ?Treating Provider/Extender: Worthy Keeler ?Patrecia Pour, Edwin ?Weeks in Treatment: 1 ?History of Present Illness ?HPI Description: 08-19-2021 upon evaluation today patient appears to be doing somewhat poorly in regard to wounds over the left and right gluteal region. The ?left is much larger than the right. With that being said he was seeing recently in the ER on 08-10-2021 for chief complaint of diarrhea. He over the last month ?has been having issues since being discharged from [facility]. Is been it was worse over the past 4 days leading up to the 10th. ?With that being said he was going to the bathroom about every hour this probably worsened things from the standpoint of the wounds. With that being said he ?is currently been using bacitracin for the wounds to help care for it. He does sleep in a recliner and he also sits in the recliner most of the days. With that being ?said I did inquire about how often he gets up to move around the patient tells me and his wife concurs maybe every 3 hours or so. I think this is a  big part of ?the issue coupled with sleep in the recliner is that he is putting pressure on this area he is also been using a foam doughnut which is really not ideal to be ?perfectly  honest. ?Patient does have a history of hypertension as well as generalized muscle weakness which is contributing to this he is able to get around and walk however ?which is good news. He does have diabetes as well most recent hemoglobin A1c was 7.0. That was in March 2023. ?08-26-2021 upon evaluation today patient appears to be doing well currently in regard to his wound. One of the wounds has healed the other though still open ?does appear to be doing better. I think we are on the right track here. ?Electronic Signature(s) ?Signed: 08/26/2021 1:07:15 PM By: Worthy Keeler PA-C ?Entered By: Worthy Keeler on 08/26/2021 13:07:15 ?-------------------------------------------------------------------------------- ?Physical Exam Details ?Patient Name: Date of Service: ?Jason Mcpherson, [place] W. 08/26/2021 12:45 PM ?Medical Record Number: [id number] ?Patient Account Number: [id number] ?Date of Birth/Sex: Treating RN: ?July 13, 1940 (81 y.o. M) Rolin Barry, Bobbi ?Primary Care Provider: Patrecia Pour, Christean Grief Other Clinician: ?Referring Provider: ?Treating Provider/Extender: Worthy Keeler ?Patrecia Pour, Edwin ?Weeks in Treatment: 1 ?Constitutional ?Well-nourished and well-hydrated in no acute distress. ?Respiratory ?normal breathing without difficulty. ?Psychiatric ?this patient is able to make decisions and demonstrates good insight into disease process. Alert and Oriented x 3. pleasant and cooperative. ?Notes ?Upon inspection patient's wound bed showed signs of good granulation and epithelization at this point. Fortunately I do not see any signs of infection locally or ?systemically which is great news and overall I am extremely pleased with where we stand today. No fevers, chills, nausea, vomiting, or diarrhea. I did perform ?sharp debridement clearly some  of the necrotic debris patient tolerated that today without complication and postdebridement wound bed appears to be doing ?much better. ?Electronic Signature(s) ?Signed: 08/26/2021 1:07:46 PM By: Worthy Keeler PA-C ?Entered By: Worthy Keeler on 08/26/2021 13:07:46 ?-------------------------------------------------------------------------------- ?Physician Orders Details ?Patient Name: Date of Service: ?Jason Mcpherson, [place] W. 08/26/2021 12:45 PM ?Medical Record Number: [id number] ?Patient Account Number: [id number] ?Date of Birth/Sex: Treating RN: ?04/01/1941 (81 y.o. M) Rolin Barry, Bobbi ?Primary Care Provider: Patrecia Pour, Christean Grief Other Clinician: ?Referring Provider: ?Treating Provider/Extender: Worthy Keeler ?Patrecia Pour, Edwin ?Weeks in Treatment: 1 ?Verbal / Phone Orders: No ?Diagnosis Coding ?ICD-10 Coding ?Code Description ?L89.313 Pressure ulcer of right buttock, stage 3 ?C37.628 Pressure ulcer of left buttock, stage 3 ?I10 Essential (primary) hypertension ?M62.81 Muscle weakness (generalized) ?E11.622 Type 2 diabetes mellitus with other skin ulcer ?Follow-up Appointments ?ppointment in 2 weeks. Jeri Cos, PA and [address], Room 8 09/09/2021 1115 ?Return A ?Bathing/ Shower/ Hygiene ?May shower with protection but do not get wound dressing(s) wet. - when not changing the dressing. ?May shower and wash wound with soap and water. - with dressing changes. ?Off-Loading ?Low air-loss mattress (Group 2) - and hospital bed for patient- [facility] to provide. ?Turn and reposition every 2 hours ?Other: - ensure when sitting, every hour stand to apply pressure off buttock. ?Home Health ?New wound care orders this week; continue Home Health for wound care. May utilize formulary equivalent dressing for wound treatment ?orders unless otherwise specified. ?Dressing changes to be completed by Home Health on Monday / Wednesday / Friday except when patient has scheduled visit at [facility]. - three times a  week on the week patient is not coming to wound center. twice a week when patient is coming to wound center on ?Wednesdays. ?Other Home Health Orders/Instructions: - [facility] ?***Wound center

## 2021-08-27 DIAGNOSIS — E1151 Type 2 diabetes mellitus with diabetic peripheral angiopathy without gangrene: Secondary | ICD-10-CM | POA: Diagnosis not present

## 2021-08-27 DIAGNOSIS — S72112D Displaced fracture of greater trochanter of left femur, subsequent encounter for closed fracture with routine healing: Secondary | ICD-10-CM | POA: Diagnosis not present

## 2021-08-27 DIAGNOSIS — E782 Mixed hyperlipidemia: Secondary | ICD-10-CM | POA: Diagnosis not present

## 2021-08-27 DIAGNOSIS — S30810D Abrasion of lower back and pelvis, subsequent encounter: Secondary | ICD-10-CM | POA: Diagnosis not present

## 2021-08-27 DIAGNOSIS — E1122 Type 2 diabetes mellitus with diabetic chronic kidney disease: Secondary | ICD-10-CM | POA: Diagnosis not present

## 2021-08-27 DIAGNOSIS — E1169 Type 2 diabetes mellitus with other specified complication: Secondary | ICD-10-CM | POA: Diagnosis not present

## 2021-08-27 DIAGNOSIS — M160 Bilateral primary osteoarthritis of hip: Secondary | ICD-10-CM | POA: Diagnosis not present

## 2021-08-27 DIAGNOSIS — H9193 Unspecified hearing loss, bilateral: Secondary | ICD-10-CM | POA: Diagnosis not present

## 2021-08-27 DIAGNOSIS — N179 Acute kidney failure, unspecified: Secondary | ICD-10-CM | POA: Diagnosis not present

## 2021-08-27 DIAGNOSIS — Z7982 Long term (current) use of aspirin: Secondary | ICD-10-CM | POA: Diagnosis not present

## 2021-08-27 DIAGNOSIS — E1142 Type 2 diabetes mellitus with diabetic polyneuropathy: Secondary | ICD-10-CM | POA: Diagnosis not present

## 2021-08-27 DIAGNOSIS — D631 Anemia in chronic kidney disease: Secondary | ICD-10-CM | POA: Diagnosis not present

## 2021-08-27 DIAGNOSIS — K219 Gastro-esophageal reflux disease without esophagitis: Secondary | ICD-10-CM | POA: Diagnosis not present

## 2021-08-27 DIAGNOSIS — Z89422 Acquired absence of other left toe(s): Secondary | ICD-10-CM | POA: Diagnosis not present

## 2021-08-27 DIAGNOSIS — M48061 Spinal stenosis, lumbar region without neurogenic claudication: Secondary | ICD-10-CM | POA: Diagnosis not present

## 2021-08-27 DIAGNOSIS — I129 Hypertensive chronic kidney disease with stage 1 through stage 4 chronic kidney disease, or unspecified chronic kidney disease: Secondary | ICD-10-CM | POA: Diagnosis not present

## 2021-08-27 DIAGNOSIS — M1712 Unilateral primary osteoarthritis, left knee: Secondary | ICD-10-CM | POA: Diagnosis not present

## 2021-08-27 DIAGNOSIS — Z9181 History of falling: Secondary | ICD-10-CM | POA: Diagnosis not present

## 2021-08-27 DIAGNOSIS — N1831 Chronic kidney disease, stage 3a: Secondary | ICD-10-CM | POA: Diagnosis not present

## 2021-08-31 DIAGNOSIS — D631 Anemia in chronic kidney disease: Secondary | ICD-10-CM | POA: Diagnosis not present

## 2021-08-31 DIAGNOSIS — E1122 Type 2 diabetes mellitus with diabetic chronic kidney disease: Secondary | ICD-10-CM | POA: Diagnosis not present

## 2021-08-31 DIAGNOSIS — H9193 Unspecified hearing loss, bilateral: Secondary | ICD-10-CM | POA: Diagnosis not present

## 2021-08-31 DIAGNOSIS — Z7982 Long term (current) use of aspirin: Secondary | ICD-10-CM | POA: Diagnosis not present

## 2021-08-31 DIAGNOSIS — Z7984 Long term (current) use of oral hypoglycemic drugs: Secondary | ICD-10-CM | POA: Diagnosis not present

## 2021-08-31 DIAGNOSIS — I129 Hypertensive chronic kidney disease with stage 1 through stage 4 chronic kidney disease, or unspecified chronic kidney disease: Secondary | ICD-10-CM | POA: Diagnosis not present

## 2021-08-31 DIAGNOSIS — Z79899 Other long term (current) drug therapy: Secondary | ICD-10-CM | POA: Diagnosis not present

## 2021-08-31 DIAGNOSIS — L89313 Pressure ulcer of right buttock, stage 3: Secondary | ICD-10-CM | POA: Diagnosis not present

## 2021-08-31 DIAGNOSIS — Z0000 Encounter for general adult medical examination without abnormal findings: Secondary | ICD-10-CM | POA: Diagnosis not present

## 2021-08-31 DIAGNOSIS — S72112D Displaced fracture of greater trochanter of left femur, subsequent encounter for closed fracture with routine healing: Secondary | ICD-10-CM | POA: Diagnosis not present

## 2021-08-31 DIAGNOSIS — Z9181 History of falling: Secondary | ICD-10-CM | POA: Diagnosis not present

## 2021-08-31 DIAGNOSIS — M1712 Unilateral primary osteoarthritis, left knee: Secondary | ICD-10-CM | POA: Diagnosis not present

## 2021-08-31 DIAGNOSIS — E785 Hyperlipidemia, unspecified: Secondary | ICD-10-CM | POA: Diagnosis not present

## 2021-08-31 DIAGNOSIS — L89323 Pressure ulcer of left buttock, stage 3: Secondary | ICD-10-CM | POA: Diagnosis present

## 2021-08-31 DIAGNOSIS — E1169 Type 2 diabetes mellitus with other specified complication: Secondary | ICD-10-CM | POA: Diagnosis not present

## 2021-08-31 DIAGNOSIS — E782 Mixed hyperlipidemia: Secondary | ICD-10-CM | POA: Diagnosis not present

## 2021-08-31 DIAGNOSIS — N1831 Chronic kidney disease, stage 3a: Secondary | ICD-10-CM | POA: Diagnosis not present

## 2021-08-31 DIAGNOSIS — N179 Acute kidney failure, unspecified: Secondary | ICD-10-CM | POA: Diagnosis not present

## 2021-08-31 DIAGNOSIS — K219 Gastro-esophageal reflux disease without esophagitis: Secondary | ICD-10-CM | POA: Diagnosis not present

## 2021-08-31 DIAGNOSIS — S30810D Abrasion of lower back and pelvis, subsequent encounter: Secondary | ICD-10-CM | POA: Diagnosis not present

## 2021-08-31 DIAGNOSIS — E1142 Type 2 diabetes mellitus with diabetic polyneuropathy: Secondary | ICD-10-CM | POA: Diagnosis not present

## 2021-08-31 DIAGNOSIS — Z89422 Acquired absence of other left toe(s): Secondary | ICD-10-CM | POA: Diagnosis not present

## 2021-08-31 DIAGNOSIS — M160 Bilateral primary osteoarthritis of hip: Secondary | ICD-10-CM | POA: Diagnosis not present

## 2021-08-31 DIAGNOSIS — E1151 Type 2 diabetes mellitus with diabetic peripheral angiopathy without gangrene: Secondary | ICD-10-CM | POA: Diagnosis not present

## 2021-08-31 DIAGNOSIS — M48061 Spinal stenosis, lumbar region without neurogenic claudication: Secondary | ICD-10-CM | POA: Diagnosis not present

## 2021-09-01 DIAGNOSIS — Z7982 Long term (current) use of aspirin: Secondary | ICD-10-CM | POA: Diagnosis not present

## 2021-09-01 DIAGNOSIS — K219 Gastro-esophageal reflux disease without esophagitis: Secondary | ICD-10-CM | POA: Diagnosis not present

## 2021-09-01 DIAGNOSIS — I129 Hypertensive chronic kidney disease with stage 1 through stage 4 chronic kidney disease, or unspecified chronic kidney disease: Secondary | ICD-10-CM | POA: Diagnosis not present

## 2021-09-01 DIAGNOSIS — S72112D Displaced fracture of greater trochanter of left femur, subsequent encounter for closed fracture with routine healing: Secondary | ICD-10-CM | POA: Diagnosis not present

## 2021-09-01 DIAGNOSIS — E1169 Type 2 diabetes mellitus with other specified complication: Secondary | ICD-10-CM | POA: Diagnosis not present

## 2021-09-01 DIAGNOSIS — N179 Acute kidney failure, unspecified: Secondary | ICD-10-CM | POA: Diagnosis not present

## 2021-09-01 DIAGNOSIS — N1831 Chronic kidney disease, stage 3a: Secondary | ICD-10-CM | POA: Diagnosis not present

## 2021-09-01 DIAGNOSIS — D631 Anemia in chronic kidney disease: Secondary | ICD-10-CM | POA: Diagnosis not present

## 2021-09-01 DIAGNOSIS — M1712 Unilateral primary osteoarthritis, left knee: Secondary | ICD-10-CM | POA: Diagnosis not present

## 2021-09-01 DIAGNOSIS — E1151 Type 2 diabetes mellitus with diabetic peripheral angiopathy without gangrene: Secondary | ICD-10-CM | POA: Diagnosis not present

## 2021-09-01 DIAGNOSIS — M48061 Spinal stenosis, lumbar region without neurogenic claudication: Secondary | ICD-10-CM | POA: Diagnosis not present

## 2021-09-01 DIAGNOSIS — S30810D Abrasion of lower back and pelvis, subsequent encounter: Secondary | ICD-10-CM | POA: Diagnosis not present

## 2021-09-01 DIAGNOSIS — M160 Bilateral primary osteoarthritis of hip: Secondary | ICD-10-CM | POA: Diagnosis not present

## 2021-09-01 DIAGNOSIS — Z9181 History of falling: Secondary | ICD-10-CM | POA: Diagnosis not present

## 2021-09-01 DIAGNOSIS — E782 Mixed hyperlipidemia: Secondary | ICD-10-CM | POA: Diagnosis not present

## 2021-09-01 DIAGNOSIS — Z89422 Acquired absence of other left toe(s): Secondary | ICD-10-CM | POA: Diagnosis not present

## 2021-09-01 DIAGNOSIS — E1122 Type 2 diabetes mellitus with diabetic chronic kidney disease: Secondary | ICD-10-CM | POA: Diagnosis not present

## 2021-09-01 DIAGNOSIS — E1142 Type 2 diabetes mellitus with diabetic polyneuropathy: Secondary | ICD-10-CM | POA: Diagnosis not present

## 2021-09-01 DIAGNOSIS — H9193 Unspecified hearing loss, bilateral: Secondary | ICD-10-CM | POA: Diagnosis not present

## 2021-09-03 DIAGNOSIS — K219 Gastro-esophageal reflux disease without esophagitis: Secondary | ICD-10-CM | POA: Diagnosis not present

## 2021-09-03 DIAGNOSIS — M1712 Unilateral primary osteoarthritis, left knee: Secondary | ICD-10-CM | POA: Diagnosis not present

## 2021-09-03 DIAGNOSIS — I129 Hypertensive chronic kidney disease with stage 1 through stage 4 chronic kidney disease, or unspecified chronic kidney disease: Secondary | ICD-10-CM | POA: Diagnosis not present

## 2021-09-03 DIAGNOSIS — M160 Bilateral primary osteoarthritis of hip: Secondary | ICD-10-CM | POA: Diagnosis not present

## 2021-09-03 DIAGNOSIS — E1122 Type 2 diabetes mellitus with diabetic chronic kidney disease: Secondary | ICD-10-CM | POA: Diagnosis not present

## 2021-09-03 DIAGNOSIS — Z89422 Acquired absence of other left toe(s): Secondary | ICD-10-CM | POA: Diagnosis not present

## 2021-09-03 DIAGNOSIS — N1831 Chronic kidney disease, stage 3a: Secondary | ICD-10-CM | POA: Diagnosis not present

## 2021-09-03 DIAGNOSIS — Z7982 Long term (current) use of aspirin: Secondary | ICD-10-CM | POA: Diagnosis not present

## 2021-09-03 DIAGNOSIS — Z9181 History of falling: Secondary | ICD-10-CM | POA: Diagnosis not present

## 2021-09-03 DIAGNOSIS — H9193 Unspecified hearing loss, bilateral: Secondary | ICD-10-CM | POA: Diagnosis not present

## 2021-09-03 DIAGNOSIS — S72112D Displaced fracture of greater trochanter of left femur, subsequent encounter for closed fracture with routine healing: Secondary | ICD-10-CM | POA: Diagnosis not present

## 2021-09-03 DIAGNOSIS — D631 Anemia in chronic kidney disease: Secondary | ICD-10-CM | POA: Diagnosis not present

## 2021-09-03 DIAGNOSIS — E1142 Type 2 diabetes mellitus with diabetic polyneuropathy: Secondary | ICD-10-CM | POA: Diagnosis not present

## 2021-09-03 DIAGNOSIS — E1151 Type 2 diabetes mellitus with diabetic peripheral angiopathy without gangrene: Secondary | ICD-10-CM | POA: Diagnosis not present

## 2021-09-03 DIAGNOSIS — E782 Mixed hyperlipidemia: Secondary | ICD-10-CM | POA: Diagnosis not present

## 2021-09-03 DIAGNOSIS — N179 Acute kidney failure, unspecified: Secondary | ICD-10-CM | POA: Diagnosis not present

## 2021-09-03 DIAGNOSIS — E1169 Type 2 diabetes mellitus with other specified complication: Secondary | ICD-10-CM | POA: Diagnosis not present

## 2021-09-03 DIAGNOSIS — M48061 Spinal stenosis, lumbar region without neurogenic claudication: Secondary | ICD-10-CM | POA: Diagnosis not present

## 2021-09-03 DIAGNOSIS — S30810D Abrasion of lower back and pelvis, subsequent encounter: Secondary | ICD-10-CM | POA: Diagnosis not present

## 2021-09-07 DIAGNOSIS — Z7982 Long term (current) use of aspirin: Secondary | ICD-10-CM | POA: Diagnosis not present

## 2021-09-07 DIAGNOSIS — K219 Gastro-esophageal reflux disease without esophagitis: Secondary | ICD-10-CM | POA: Diagnosis not present

## 2021-09-07 DIAGNOSIS — S30810D Abrasion of lower back and pelvis, subsequent encounter: Secondary | ICD-10-CM | POA: Diagnosis not present

## 2021-09-07 DIAGNOSIS — Z89422 Acquired absence of other left toe(s): Secondary | ICD-10-CM | POA: Diagnosis not present

## 2021-09-07 DIAGNOSIS — E1169 Type 2 diabetes mellitus with other specified complication: Secondary | ICD-10-CM | POA: Diagnosis not present

## 2021-09-07 DIAGNOSIS — E1142 Type 2 diabetes mellitus with diabetic polyneuropathy: Secondary | ICD-10-CM | POA: Diagnosis not present

## 2021-09-07 DIAGNOSIS — S72112D Displaced fracture of greater trochanter of left femur, subsequent encounter for closed fracture with routine healing: Secondary | ICD-10-CM | POA: Diagnosis not present

## 2021-09-07 DIAGNOSIS — E1122 Type 2 diabetes mellitus with diabetic chronic kidney disease: Secondary | ICD-10-CM | POA: Diagnosis not present

## 2021-09-07 DIAGNOSIS — N1831 Chronic kidney disease, stage 3a: Secondary | ICD-10-CM | POA: Diagnosis not present

## 2021-09-07 DIAGNOSIS — M1712 Unilateral primary osteoarthritis, left knee: Secondary | ICD-10-CM | POA: Diagnosis not present

## 2021-09-07 DIAGNOSIS — D631 Anemia in chronic kidney disease: Secondary | ICD-10-CM | POA: Diagnosis not present

## 2021-09-07 DIAGNOSIS — E1151 Type 2 diabetes mellitus with diabetic peripheral angiopathy without gangrene: Secondary | ICD-10-CM | POA: Diagnosis not present

## 2021-09-07 DIAGNOSIS — H9193 Unspecified hearing loss, bilateral: Secondary | ICD-10-CM | POA: Diagnosis not present

## 2021-09-07 DIAGNOSIS — M48061 Spinal stenosis, lumbar region without neurogenic claudication: Secondary | ICD-10-CM | POA: Diagnosis not present

## 2021-09-07 DIAGNOSIS — I129 Hypertensive chronic kidney disease with stage 1 through stage 4 chronic kidney disease, or unspecified chronic kidney disease: Secondary | ICD-10-CM | POA: Diagnosis not present

## 2021-09-07 DIAGNOSIS — E782 Mixed hyperlipidemia: Secondary | ICD-10-CM | POA: Diagnosis not present

## 2021-09-07 DIAGNOSIS — N179 Acute kidney failure, unspecified: Secondary | ICD-10-CM | POA: Diagnosis not present

## 2021-09-07 DIAGNOSIS — M160 Bilateral primary osteoarthritis of hip: Secondary | ICD-10-CM | POA: Diagnosis not present

## 2021-09-07 DIAGNOSIS — Z9181 History of falling: Secondary | ICD-10-CM | POA: Diagnosis not present

## 2021-09-09 ENCOUNTER — Encounter (HOSPITAL_BASED_OUTPATIENT_CLINIC_OR_DEPARTMENT_OTHER): Payer: Medicare HMO | Admitting: Physician Assistant

## 2021-09-10 DIAGNOSIS — S72112D Displaced fracture of greater trochanter of left femur, subsequent encounter for closed fracture with routine healing: Secondary | ICD-10-CM | POA: Diagnosis not present

## 2021-09-10 DIAGNOSIS — N1831 Chronic kidney disease, stage 3a: Secondary | ICD-10-CM | POA: Diagnosis not present

## 2021-09-10 DIAGNOSIS — E1151 Type 2 diabetes mellitus with diabetic peripheral angiopathy without gangrene: Secondary | ICD-10-CM | POA: Diagnosis not present

## 2021-09-10 DIAGNOSIS — I129 Hypertensive chronic kidney disease with stage 1 through stage 4 chronic kidney disease, or unspecified chronic kidney disease: Secondary | ICD-10-CM | POA: Diagnosis not present

## 2021-09-10 DIAGNOSIS — M48061 Spinal stenosis, lumbar region without neurogenic claudication: Secondary | ICD-10-CM | POA: Diagnosis not present

## 2021-09-10 DIAGNOSIS — H9193 Unspecified hearing loss, bilateral: Secondary | ICD-10-CM | POA: Diagnosis not present

## 2021-09-10 DIAGNOSIS — E782 Mixed hyperlipidemia: Secondary | ICD-10-CM | POA: Diagnosis not present

## 2021-09-10 DIAGNOSIS — S30810D Abrasion of lower back and pelvis, subsequent encounter: Secondary | ICD-10-CM | POA: Diagnosis not present

## 2021-09-10 DIAGNOSIS — M160 Bilateral primary osteoarthritis of hip: Secondary | ICD-10-CM | POA: Diagnosis not present

## 2021-09-10 DIAGNOSIS — M1712 Unilateral primary osteoarthritis, left knee: Secondary | ICD-10-CM | POA: Diagnosis not present

## 2021-09-10 DIAGNOSIS — D631 Anemia in chronic kidney disease: Secondary | ICD-10-CM | POA: Diagnosis not present

## 2021-09-10 DIAGNOSIS — Z9181 History of falling: Secondary | ICD-10-CM | POA: Diagnosis not present

## 2021-09-10 DIAGNOSIS — K219 Gastro-esophageal reflux disease without esophagitis: Secondary | ICD-10-CM | POA: Diagnosis not present

## 2021-09-10 DIAGNOSIS — N179 Acute kidney failure, unspecified: Secondary | ICD-10-CM | POA: Diagnosis not present

## 2021-09-10 DIAGNOSIS — Z7982 Long term (current) use of aspirin: Secondary | ICD-10-CM | POA: Diagnosis not present

## 2021-09-10 DIAGNOSIS — E1122 Type 2 diabetes mellitus with diabetic chronic kidney disease: Secondary | ICD-10-CM | POA: Diagnosis not present

## 2021-09-10 DIAGNOSIS — E1169 Type 2 diabetes mellitus with other specified complication: Secondary | ICD-10-CM | POA: Diagnosis not present

## 2021-09-10 DIAGNOSIS — Z89422 Acquired absence of other left toe(s): Secondary | ICD-10-CM | POA: Diagnosis not present

## 2021-09-10 DIAGNOSIS — E1142 Type 2 diabetes mellitus with diabetic polyneuropathy: Secondary | ICD-10-CM | POA: Diagnosis not present

## 2021-09-16 DIAGNOSIS — Z89422 Acquired absence of other left toe(s): Secondary | ICD-10-CM | POA: Diagnosis not present

## 2021-09-16 DIAGNOSIS — M48061 Spinal stenosis, lumbar region without neurogenic claudication: Secondary | ICD-10-CM | POA: Diagnosis not present

## 2021-09-16 DIAGNOSIS — Z9181 History of falling: Secondary | ICD-10-CM | POA: Diagnosis not present

## 2021-09-16 DIAGNOSIS — E1122 Type 2 diabetes mellitus with diabetic chronic kidney disease: Secondary | ICD-10-CM | POA: Diagnosis not present

## 2021-09-16 DIAGNOSIS — H9193 Unspecified hearing loss, bilateral: Secondary | ICD-10-CM | POA: Diagnosis not present

## 2021-09-16 DIAGNOSIS — E782 Mixed hyperlipidemia: Secondary | ICD-10-CM | POA: Diagnosis not present

## 2021-09-16 DIAGNOSIS — I129 Hypertensive chronic kidney disease with stage 1 through stage 4 chronic kidney disease, or unspecified chronic kidney disease: Secondary | ICD-10-CM | POA: Diagnosis not present

## 2021-09-16 DIAGNOSIS — E1142 Type 2 diabetes mellitus with diabetic polyneuropathy: Secondary | ICD-10-CM | POA: Diagnosis not present

## 2021-09-16 DIAGNOSIS — M1712 Unilateral primary osteoarthritis, left knee: Secondary | ICD-10-CM | POA: Diagnosis not present

## 2021-09-16 DIAGNOSIS — S72112D Displaced fracture of greater trochanter of left femur, subsequent encounter for closed fracture with routine healing: Secondary | ICD-10-CM | POA: Diagnosis not present

## 2021-09-16 DIAGNOSIS — D631 Anemia in chronic kidney disease: Secondary | ICD-10-CM | POA: Diagnosis not present

## 2021-09-16 DIAGNOSIS — Z7982 Long term (current) use of aspirin: Secondary | ICD-10-CM | POA: Diagnosis not present

## 2021-09-16 DIAGNOSIS — E1151 Type 2 diabetes mellitus with diabetic peripheral angiopathy without gangrene: Secondary | ICD-10-CM | POA: Diagnosis not present

## 2021-09-16 DIAGNOSIS — S30810D Abrasion of lower back and pelvis, subsequent encounter: Secondary | ICD-10-CM | POA: Diagnosis not present

## 2021-09-16 DIAGNOSIS — M160 Bilateral primary osteoarthritis of hip: Secondary | ICD-10-CM | POA: Diagnosis not present

## 2021-09-16 DIAGNOSIS — N1831 Chronic kidney disease, stage 3a: Secondary | ICD-10-CM | POA: Diagnosis not present

## 2021-09-16 DIAGNOSIS — E1169 Type 2 diabetes mellitus with other specified complication: Secondary | ICD-10-CM | POA: Diagnosis not present

## 2021-09-16 DIAGNOSIS — N179 Acute kidney failure, unspecified: Secondary | ICD-10-CM | POA: Diagnosis not present

## 2021-09-16 DIAGNOSIS — K219 Gastro-esophageal reflux disease without esophagitis: Secondary | ICD-10-CM | POA: Diagnosis not present

## 2021-09-18 DIAGNOSIS — E1169 Type 2 diabetes mellitus with other specified complication: Secondary | ICD-10-CM | POA: Diagnosis not present

## 2021-09-18 DIAGNOSIS — Z9181 History of falling: Secondary | ICD-10-CM | POA: Diagnosis not present

## 2021-09-18 DIAGNOSIS — M160 Bilateral primary osteoarthritis of hip: Secondary | ICD-10-CM | POA: Diagnosis not present

## 2021-09-18 DIAGNOSIS — E1151 Type 2 diabetes mellitus with diabetic peripheral angiopathy without gangrene: Secondary | ICD-10-CM | POA: Diagnosis not present

## 2021-09-18 DIAGNOSIS — D631 Anemia in chronic kidney disease: Secondary | ICD-10-CM | POA: Diagnosis not present

## 2021-09-18 DIAGNOSIS — I129 Hypertensive chronic kidney disease with stage 1 through stage 4 chronic kidney disease, or unspecified chronic kidney disease: Secondary | ICD-10-CM | POA: Diagnosis not present

## 2021-09-18 DIAGNOSIS — H9193 Unspecified hearing loss, bilateral: Secondary | ICD-10-CM | POA: Diagnosis not present

## 2021-09-18 DIAGNOSIS — M48061 Spinal stenosis, lumbar region without neurogenic claudication: Secondary | ICD-10-CM | POA: Diagnosis not present

## 2021-09-18 DIAGNOSIS — S72112D Displaced fracture of greater trochanter of left femur, subsequent encounter for closed fracture with routine healing: Secondary | ICD-10-CM | POA: Diagnosis not present

## 2021-09-18 DIAGNOSIS — S30810D Abrasion of lower back and pelvis, subsequent encounter: Secondary | ICD-10-CM | POA: Diagnosis not present

## 2021-09-18 DIAGNOSIS — M1712 Unilateral primary osteoarthritis, left knee: Secondary | ICD-10-CM | POA: Diagnosis not present

## 2021-09-18 DIAGNOSIS — Z89422 Acquired absence of other left toe(s): Secondary | ICD-10-CM | POA: Diagnosis not present

## 2021-09-18 DIAGNOSIS — Z7982 Long term (current) use of aspirin: Secondary | ICD-10-CM | POA: Diagnosis not present

## 2021-09-18 DIAGNOSIS — E782 Mixed hyperlipidemia: Secondary | ICD-10-CM | POA: Diagnosis not present

## 2021-09-18 DIAGNOSIS — E1142 Type 2 diabetes mellitus with diabetic polyneuropathy: Secondary | ICD-10-CM | POA: Diagnosis not present

## 2021-09-18 DIAGNOSIS — K219 Gastro-esophageal reflux disease without esophagitis: Secondary | ICD-10-CM | POA: Diagnosis not present

## 2021-09-18 DIAGNOSIS — E1122 Type 2 diabetes mellitus with diabetic chronic kidney disease: Secondary | ICD-10-CM | POA: Diagnosis not present

## 2021-09-18 DIAGNOSIS — N1831 Chronic kidney disease, stage 3a: Secondary | ICD-10-CM | POA: Diagnosis not present

## 2021-09-18 DIAGNOSIS — N179 Acute kidney failure, unspecified: Secondary | ICD-10-CM | POA: Diagnosis not present

## 2021-09-21 DIAGNOSIS — E785 Hyperlipidemia, unspecified: Secondary | ICD-10-CM | POA: Diagnosis not present

## 2021-09-21 DIAGNOSIS — I129 Hypertensive chronic kidney disease with stage 1 through stage 4 chronic kidney disease, or unspecified chronic kidney disease: Secondary | ICD-10-CM | POA: Diagnosis not present

## 2021-09-21 DIAGNOSIS — N184 Chronic kidney disease, stage 4 (severe): Secondary | ICD-10-CM | POA: Diagnosis not present

## 2021-09-21 DIAGNOSIS — E1169 Type 2 diabetes mellitus with other specified complication: Secondary | ICD-10-CM | POA: Diagnosis not present

## 2021-09-21 DIAGNOSIS — Z7984 Long term (current) use of oral hypoglycemic drugs: Secondary | ICD-10-CM | POA: Diagnosis not present

## 2021-09-21 DIAGNOSIS — E1142 Type 2 diabetes mellitus with diabetic polyneuropathy: Secondary | ICD-10-CM | POA: Diagnosis not present

## 2021-09-21 DIAGNOSIS — E1122 Type 2 diabetes mellitus with diabetic chronic kidney disease: Secondary | ICD-10-CM | POA: Diagnosis not present

## 2021-09-21 DIAGNOSIS — E1151 Type 2 diabetes mellitus with diabetic peripheral angiopathy without gangrene: Secondary | ICD-10-CM | POA: Diagnosis not present

## 2021-09-23 DIAGNOSIS — E1142 Type 2 diabetes mellitus with diabetic polyneuropathy: Secondary | ICD-10-CM | POA: Diagnosis not present

## 2021-09-23 DIAGNOSIS — I129 Hypertensive chronic kidney disease with stage 1 through stage 4 chronic kidney disease, or unspecified chronic kidney disease: Secondary | ICD-10-CM | POA: Diagnosis not present

## 2021-09-23 DIAGNOSIS — Z9181 History of falling: Secondary | ICD-10-CM | POA: Diagnosis not present

## 2021-09-23 DIAGNOSIS — E1169 Type 2 diabetes mellitus with other specified complication: Secondary | ICD-10-CM | POA: Diagnosis not present

## 2021-09-23 DIAGNOSIS — L89153 Pressure ulcer of sacral region, stage 3: Secondary | ICD-10-CM | POA: Diagnosis not present

## 2021-09-23 DIAGNOSIS — L89313 Pressure ulcer of right buttock, stage 3: Secondary | ICD-10-CM | POA: Diagnosis not present

## 2021-09-23 DIAGNOSIS — K219 Gastro-esophageal reflux disease without esophagitis: Secondary | ICD-10-CM | POA: Diagnosis not present

## 2021-09-23 DIAGNOSIS — H9193 Unspecified hearing loss, bilateral: Secondary | ICD-10-CM | POA: Diagnosis not present

## 2021-09-23 DIAGNOSIS — E1122 Type 2 diabetes mellitus with diabetic chronic kidney disease: Secondary | ICD-10-CM | POA: Diagnosis not present

## 2021-09-23 DIAGNOSIS — Z7984 Long term (current) use of oral hypoglycemic drugs: Secondary | ICD-10-CM | POA: Diagnosis not present

## 2021-09-23 DIAGNOSIS — D631 Anemia in chronic kidney disease: Secondary | ICD-10-CM | POA: Diagnosis not present

## 2021-09-23 DIAGNOSIS — E782 Mixed hyperlipidemia: Secondary | ICD-10-CM | POA: Diagnosis not present

## 2021-09-23 DIAGNOSIS — M1712 Unilateral primary osteoarthritis, left knee: Secondary | ICD-10-CM | POA: Diagnosis not present

## 2021-09-23 DIAGNOSIS — E1151 Type 2 diabetes mellitus with diabetic peripheral angiopathy without gangrene: Secondary | ICD-10-CM | POA: Diagnosis not present

## 2021-09-23 DIAGNOSIS — M160 Bilateral primary osteoarthritis of hip: Secondary | ICD-10-CM | POA: Diagnosis not present

## 2021-09-23 DIAGNOSIS — Z7982 Long term (current) use of aspirin: Secondary | ICD-10-CM | POA: Diagnosis not present

## 2021-09-23 DIAGNOSIS — L89323 Pressure ulcer of left buttock, stage 3: Secondary | ICD-10-CM | POA: Diagnosis not present

## 2021-09-23 DIAGNOSIS — Z89422 Acquired absence of other left toe(s): Secondary | ICD-10-CM | POA: Diagnosis not present

## 2021-09-23 DIAGNOSIS — M48061 Spinal stenosis, lumbar region without neurogenic claudication: Secondary | ICD-10-CM | POA: Diagnosis not present

## 2021-09-23 DIAGNOSIS — S72112D Displaced fracture of greater trochanter of left femur, subsequent encounter for closed fracture with routine healing: Secondary | ICD-10-CM | POA: Diagnosis not present

## 2021-09-23 DIAGNOSIS — N1831 Chronic kidney disease, stage 3a: Secondary | ICD-10-CM | POA: Diagnosis not present

## 2021-09-24 DIAGNOSIS — M48061 Spinal stenosis, lumbar region without neurogenic claudication: Secondary | ICD-10-CM | POA: Diagnosis not present

## 2021-09-24 DIAGNOSIS — L89153 Pressure ulcer of sacral region, stage 3: Secondary | ICD-10-CM | POA: Diagnosis not present

## 2021-09-24 DIAGNOSIS — S72112D Displaced fracture of greater trochanter of left femur, subsequent encounter for closed fracture with routine healing: Secondary | ICD-10-CM | POA: Diagnosis not present

## 2021-09-24 DIAGNOSIS — E1122 Type 2 diabetes mellitus with diabetic chronic kidney disease: Secondary | ICD-10-CM | POA: Diagnosis not present

## 2021-09-24 DIAGNOSIS — I129 Hypertensive chronic kidney disease with stage 1 through stage 4 chronic kidney disease, or unspecified chronic kidney disease: Secondary | ICD-10-CM | POA: Diagnosis not present

## 2021-09-24 DIAGNOSIS — H9193 Unspecified hearing loss, bilateral: Secondary | ICD-10-CM | POA: Diagnosis not present

## 2021-09-24 DIAGNOSIS — Z7982 Long term (current) use of aspirin: Secondary | ICD-10-CM | POA: Diagnosis not present

## 2021-09-24 DIAGNOSIS — Z9181 History of falling: Secondary | ICD-10-CM | POA: Diagnosis not present

## 2021-09-24 DIAGNOSIS — E1142 Type 2 diabetes mellitus with diabetic polyneuropathy: Secondary | ICD-10-CM | POA: Diagnosis not present

## 2021-09-24 DIAGNOSIS — Z89422 Acquired absence of other left toe(s): Secondary | ICD-10-CM | POA: Diagnosis not present

## 2021-09-24 DIAGNOSIS — M1712 Unilateral primary osteoarthritis, left knee: Secondary | ICD-10-CM | POA: Diagnosis not present

## 2021-09-24 DIAGNOSIS — K219 Gastro-esophageal reflux disease without esophagitis: Secondary | ICD-10-CM | POA: Diagnosis not present

## 2021-09-24 DIAGNOSIS — E782 Mixed hyperlipidemia: Secondary | ICD-10-CM | POA: Diagnosis not present

## 2021-09-24 DIAGNOSIS — E1169 Type 2 diabetes mellitus with other specified complication: Secondary | ICD-10-CM | POA: Diagnosis not present

## 2021-09-24 DIAGNOSIS — Z7984 Long term (current) use of oral hypoglycemic drugs: Secondary | ICD-10-CM | POA: Diagnosis not present

## 2021-09-24 DIAGNOSIS — D631 Anemia in chronic kidney disease: Secondary | ICD-10-CM | POA: Diagnosis not present

## 2021-09-24 DIAGNOSIS — M160 Bilateral primary osteoarthritis of hip: Secondary | ICD-10-CM | POA: Diagnosis not present

## 2021-09-24 DIAGNOSIS — N1831 Chronic kidney disease, stage 3a: Secondary | ICD-10-CM | POA: Diagnosis not present

## 2021-09-24 DIAGNOSIS — E1151 Type 2 diabetes mellitus with diabetic peripheral angiopathy without gangrene: Secondary | ICD-10-CM | POA: Diagnosis not present

## 2021-09-25 DIAGNOSIS — N1831 Chronic kidney disease, stage 3a: Secondary | ICD-10-CM | POA: Diagnosis not present

## 2021-09-25 DIAGNOSIS — E1122 Type 2 diabetes mellitus with diabetic chronic kidney disease: Secondary | ICD-10-CM | POA: Diagnosis not present

## 2021-09-25 DIAGNOSIS — N179 Acute kidney failure, unspecified: Secondary | ICD-10-CM | POA: Diagnosis not present

## 2021-09-29 DIAGNOSIS — M48061 Spinal stenosis, lumbar region without neurogenic claudication: Secondary | ICD-10-CM | POA: Diagnosis not present

## 2021-09-29 DIAGNOSIS — Z89422 Acquired absence of other left toe(s): Secondary | ICD-10-CM | POA: Diagnosis not present

## 2021-09-29 DIAGNOSIS — S72112D Displaced fracture of greater trochanter of left femur, subsequent encounter for closed fracture with routine healing: Secondary | ICD-10-CM | POA: Diagnosis not present

## 2021-09-29 DIAGNOSIS — Z7984 Long term (current) use of oral hypoglycemic drugs: Secondary | ICD-10-CM | POA: Diagnosis not present

## 2021-09-29 DIAGNOSIS — M160 Bilateral primary osteoarthritis of hip: Secondary | ICD-10-CM | POA: Diagnosis not present

## 2021-09-29 DIAGNOSIS — N1832 Chronic kidney disease, stage 3b: Secondary | ICD-10-CM | POA: Diagnosis not present

## 2021-09-29 DIAGNOSIS — Z9181 History of falling: Secondary | ICD-10-CM | POA: Diagnosis not present

## 2021-09-29 DIAGNOSIS — E559 Vitamin D deficiency, unspecified: Secondary | ICD-10-CM | POA: Diagnosis not present

## 2021-09-29 DIAGNOSIS — D631 Anemia in chronic kidney disease: Secondary | ICD-10-CM | POA: Diagnosis not present

## 2021-09-29 DIAGNOSIS — M1712 Unilateral primary osteoarthritis, left knee: Secondary | ICD-10-CM | POA: Diagnosis not present

## 2021-09-29 DIAGNOSIS — N183 Chronic kidney disease, stage 3 unspecified: Secondary | ICD-10-CM | POA: Diagnosis not present

## 2021-09-29 DIAGNOSIS — D508 Other iron deficiency anemias: Secondary | ICD-10-CM | POA: Diagnosis not present

## 2021-09-29 DIAGNOSIS — H9193 Unspecified hearing loss, bilateral: Secondary | ICD-10-CM | POA: Diagnosis not present

## 2021-09-29 DIAGNOSIS — L89153 Pressure ulcer of sacral region, stage 3: Secondary | ICD-10-CM | POA: Diagnosis not present

## 2021-09-29 DIAGNOSIS — E782 Mixed hyperlipidemia: Secondary | ICD-10-CM | POA: Diagnosis not present

## 2021-09-29 DIAGNOSIS — E1142 Type 2 diabetes mellitus with diabetic polyneuropathy: Secondary | ICD-10-CM | POA: Diagnosis not present

## 2021-09-29 DIAGNOSIS — E1122 Type 2 diabetes mellitus with diabetic chronic kidney disease: Secondary | ICD-10-CM | POA: Diagnosis not present

## 2021-09-29 DIAGNOSIS — K219 Gastro-esophageal reflux disease without esophagitis: Secondary | ICD-10-CM | POA: Diagnosis not present

## 2021-09-29 DIAGNOSIS — E1151 Type 2 diabetes mellitus with diabetic peripheral angiopathy without gangrene: Secondary | ICD-10-CM | POA: Diagnosis not present

## 2021-09-29 DIAGNOSIS — Z7982 Long term (current) use of aspirin: Secondary | ICD-10-CM | POA: Diagnosis not present

## 2021-09-29 DIAGNOSIS — N1831 Chronic kidney disease, stage 3a: Secondary | ICD-10-CM | POA: Diagnosis not present

## 2021-09-29 DIAGNOSIS — I129 Hypertensive chronic kidney disease with stage 1 through stage 4 chronic kidney disease, or unspecified chronic kidney disease: Secondary | ICD-10-CM | POA: Diagnosis not present

## 2021-09-29 DIAGNOSIS — E1169 Type 2 diabetes mellitus with other specified complication: Secondary | ICD-10-CM | POA: Diagnosis not present

## 2021-09-30 DIAGNOSIS — R8290 Unspecified abnormal findings in urine: Secondary | ICD-10-CM | POA: Diagnosis not present

## 2021-09-30 DIAGNOSIS — N1832 Chronic kidney disease, stage 3b: Secondary | ICD-10-CM | POA: Diagnosis not present

## 2021-09-30 DIAGNOSIS — E1122 Type 2 diabetes mellitus with diabetic chronic kidney disease: Secondary | ICD-10-CM | POA: Diagnosis not present

## 2021-10-01 DIAGNOSIS — Z7982 Long term (current) use of aspirin: Secondary | ICD-10-CM | POA: Diagnosis not present

## 2021-10-01 DIAGNOSIS — E1169 Type 2 diabetes mellitus with other specified complication: Secondary | ICD-10-CM | POA: Diagnosis not present

## 2021-10-01 DIAGNOSIS — D631 Anemia in chronic kidney disease: Secondary | ICD-10-CM | POA: Diagnosis not present

## 2021-10-01 DIAGNOSIS — M48061 Spinal stenosis, lumbar region without neurogenic claudication: Secondary | ICD-10-CM | POA: Diagnosis not present

## 2021-10-01 DIAGNOSIS — Z89422 Acquired absence of other left toe(s): Secondary | ICD-10-CM | POA: Diagnosis not present

## 2021-10-01 DIAGNOSIS — E1151 Type 2 diabetes mellitus with diabetic peripheral angiopathy without gangrene: Secondary | ICD-10-CM | POA: Diagnosis not present

## 2021-10-01 DIAGNOSIS — L89153 Pressure ulcer of sacral region, stage 3: Secondary | ICD-10-CM | POA: Diagnosis not present

## 2021-10-01 DIAGNOSIS — N1831 Chronic kidney disease, stage 3a: Secondary | ICD-10-CM | POA: Diagnosis not present

## 2021-10-01 DIAGNOSIS — K219 Gastro-esophageal reflux disease without esophagitis: Secondary | ICD-10-CM | POA: Diagnosis not present

## 2021-10-01 DIAGNOSIS — E782 Mixed hyperlipidemia: Secondary | ICD-10-CM | POA: Diagnosis not present

## 2021-10-01 DIAGNOSIS — I129 Hypertensive chronic kidney disease with stage 1 through stage 4 chronic kidney disease, or unspecified chronic kidney disease: Secondary | ICD-10-CM | POA: Diagnosis not present

## 2021-10-01 DIAGNOSIS — H9193 Unspecified hearing loss, bilateral: Secondary | ICD-10-CM | POA: Diagnosis not present

## 2021-10-01 DIAGNOSIS — M160 Bilateral primary osteoarthritis of hip: Secondary | ICD-10-CM | POA: Diagnosis not present

## 2021-10-01 DIAGNOSIS — Z7984 Long term (current) use of oral hypoglycemic drugs: Secondary | ICD-10-CM | POA: Diagnosis not present

## 2021-10-01 DIAGNOSIS — S72112D Displaced fracture of greater trochanter of left femur, subsequent encounter for closed fracture with routine healing: Secondary | ICD-10-CM | POA: Diagnosis not present

## 2021-10-01 DIAGNOSIS — Z9181 History of falling: Secondary | ICD-10-CM | POA: Diagnosis not present

## 2021-10-01 DIAGNOSIS — E1142 Type 2 diabetes mellitus with diabetic polyneuropathy: Secondary | ICD-10-CM | POA: Diagnosis not present

## 2021-10-01 DIAGNOSIS — E1122 Type 2 diabetes mellitus with diabetic chronic kidney disease: Secondary | ICD-10-CM | POA: Diagnosis not present

## 2021-10-01 DIAGNOSIS — M1712 Unilateral primary osteoarthritis, left knee: Secondary | ICD-10-CM | POA: Diagnosis not present

## 2021-10-05 DIAGNOSIS — S72112D Displaced fracture of greater trochanter of left femur, subsequent encounter for closed fracture with routine healing: Secondary | ICD-10-CM | POA: Diagnosis not present

## 2021-10-05 DIAGNOSIS — M48061 Spinal stenosis, lumbar region without neurogenic claudication: Secondary | ICD-10-CM | POA: Diagnosis not present

## 2021-10-05 DIAGNOSIS — E1169 Type 2 diabetes mellitus with other specified complication: Secondary | ICD-10-CM | POA: Diagnosis not present

## 2021-10-05 DIAGNOSIS — Z7982 Long term (current) use of aspirin: Secondary | ICD-10-CM | POA: Diagnosis not present

## 2021-10-05 DIAGNOSIS — D631 Anemia in chronic kidney disease: Secondary | ICD-10-CM | POA: Diagnosis not present

## 2021-10-05 DIAGNOSIS — Z89422 Acquired absence of other left toe(s): Secondary | ICD-10-CM | POA: Diagnosis not present

## 2021-10-05 DIAGNOSIS — K219 Gastro-esophageal reflux disease without esophagitis: Secondary | ICD-10-CM | POA: Diagnosis not present

## 2021-10-05 DIAGNOSIS — E1122 Type 2 diabetes mellitus with diabetic chronic kidney disease: Secondary | ICD-10-CM | POA: Diagnosis not present

## 2021-10-05 DIAGNOSIS — Z7984 Long term (current) use of oral hypoglycemic drugs: Secondary | ICD-10-CM | POA: Diagnosis not present

## 2021-10-05 DIAGNOSIS — E1142 Type 2 diabetes mellitus with diabetic polyneuropathy: Secondary | ICD-10-CM | POA: Diagnosis not present

## 2021-10-05 DIAGNOSIS — I129 Hypertensive chronic kidney disease with stage 1 through stage 4 chronic kidney disease, or unspecified chronic kidney disease: Secondary | ICD-10-CM | POA: Diagnosis not present

## 2021-10-05 DIAGNOSIS — E1151 Type 2 diabetes mellitus with diabetic peripheral angiopathy without gangrene: Secondary | ICD-10-CM | POA: Diagnosis not present

## 2021-10-05 DIAGNOSIS — Z9181 History of falling: Secondary | ICD-10-CM | POA: Diagnosis not present

## 2021-10-05 DIAGNOSIS — N1831 Chronic kidney disease, stage 3a: Secondary | ICD-10-CM | POA: Diagnosis not present

## 2021-10-05 DIAGNOSIS — M1712 Unilateral primary osteoarthritis, left knee: Secondary | ICD-10-CM | POA: Diagnosis not present

## 2021-10-05 DIAGNOSIS — E782 Mixed hyperlipidemia: Secondary | ICD-10-CM | POA: Diagnosis not present

## 2021-10-05 DIAGNOSIS — H9193 Unspecified hearing loss, bilateral: Secondary | ICD-10-CM | POA: Diagnosis not present

## 2021-10-05 DIAGNOSIS — M160 Bilateral primary osteoarthritis of hip: Secondary | ICD-10-CM | POA: Diagnosis not present

## 2021-10-05 DIAGNOSIS — L89153 Pressure ulcer of sacral region, stage 3: Secondary | ICD-10-CM | POA: Diagnosis not present

## 2021-10-06 DIAGNOSIS — L89153 Pressure ulcer of sacral region, stage 3: Secondary | ICD-10-CM | POA: Diagnosis not present

## 2021-10-06 DIAGNOSIS — N1831 Chronic kidney disease, stage 3a: Secondary | ICD-10-CM | POA: Diagnosis not present

## 2021-10-06 DIAGNOSIS — S72112D Displaced fracture of greater trochanter of left femur, subsequent encounter for closed fracture with routine healing: Secondary | ICD-10-CM | POA: Diagnosis not present

## 2021-10-06 DIAGNOSIS — N1832 Chronic kidney disease, stage 3b: Secondary | ICD-10-CM | POA: Diagnosis not present

## 2021-10-06 DIAGNOSIS — E11621 Type 2 diabetes mellitus with foot ulcer: Secondary | ICD-10-CM | POA: Diagnosis not present

## 2021-10-06 DIAGNOSIS — E1142 Type 2 diabetes mellitus with diabetic polyneuropathy: Secondary | ICD-10-CM | POA: Diagnosis not present

## 2021-10-06 DIAGNOSIS — E782 Mixed hyperlipidemia: Secondary | ICD-10-CM | POA: Diagnosis not present

## 2021-10-06 DIAGNOSIS — H9193 Unspecified hearing loss, bilateral: Secondary | ICD-10-CM | POA: Diagnosis not present

## 2021-10-06 DIAGNOSIS — E1122 Type 2 diabetes mellitus with diabetic chronic kidney disease: Secondary | ICD-10-CM | POA: Diagnosis not present

## 2021-10-06 DIAGNOSIS — Z789 Other specified health status: Secondary | ICD-10-CM | POA: Diagnosis not present

## 2021-10-06 DIAGNOSIS — D631 Anemia in chronic kidney disease: Secondary | ICD-10-CM | POA: Diagnosis not present

## 2021-10-06 DIAGNOSIS — E1169 Type 2 diabetes mellitus with other specified complication: Secondary | ICD-10-CM | POA: Diagnosis not present

## 2021-10-06 DIAGNOSIS — M48061 Spinal stenosis, lumbar region without neurogenic claudication: Secondary | ICD-10-CM | POA: Diagnosis not present

## 2021-10-06 DIAGNOSIS — Z7982 Long term (current) use of aspirin: Secondary | ICD-10-CM | POA: Diagnosis not present

## 2021-10-06 DIAGNOSIS — Z89422 Acquired absence of other left toe(s): Secondary | ICD-10-CM | POA: Diagnosis not present

## 2021-10-06 DIAGNOSIS — I129 Hypertensive chronic kidney disease with stage 1 through stage 4 chronic kidney disease, or unspecified chronic kidney disease: Secondary | ICD-10-CM | POA: Diagnosis not present

## 2021-10-06 DIAGNOSIS — M160 Bilateral primary osteoarthritis of hip: Secondary | ICD-10-CM | POA: Diagnosis not present

## 2021-10-06 DIAGNOSIS — L97529 Non-pressure chronic ulcer of other part of left foot with unspecified severity: Secondary | ICD-10-CM | POA: Diagnosis not present

## 2021-10-06 DIAGNOSIS — K219 Gastro-esophageal reflux disease without esophagitis: Secondary | ICD-10-CM | POA: Diagnosis not present

## 2021-10-06 DIAGNOSIS — M2042 Other hammer toe(s) (acquired), left foot: Secondary | ICD-10-CM | POA: Diagnosis not present

## 2021-10-06 DIAGNOSIS — Z7984 Long term (current) use of oral hypoglycemic drugs: Secondary | ICD-10-CM | POA: Diagnosis not present

## 2021-10-06 DIAGNOSIS — E1151 Type 2 diabetes mellitus with diabetic peripheral angiopathy without gangrene: Secondary | ICD-10-CM | POA: Diagnosis not present

## 2021-10-06 DIAGNOSIS — L03116 Cellulitis of left lower limb: Secondary | ICD-10-CM | POA: Diagnosis not present

## 2021-10-06 DIAGNOSIS — Z9181 History of falling: Secondary | ICD-10-CM | POA: Diagnosis not present

## 2021-10-06 DIAGNOSIS — M1712 Unilateral primary osteoarthritis, left knee: Secondary | ICD-10-CM | POA: Diagnosis not present

## 2021-10-08 DIAGNOSIS — E1142 Type 2 diabetes mellitus with diabetic polyneuropathy: Secondary | ICD-10-CM | POA: Diagnosis not present

## 2021-10-08 DIAGNOSIS — E1169 Type 2 diabetes mellitus with other specified complication: Secondary | ICD-10-CM | POA: Diagnosis not present

## 2021-10-08 DIAGNOSIS — E782 Mixed hyperlipidemia: Secondary | ICD-10-CM | POA: Diagnosis not present

## 2021-10-08 DIAGNOSIS — Z7984 Long term (current) use of oral hypoglycemic drugs: Secondary | ICD-10-CM | POA: Diagnosis not present

## 2021-10-08 DIAGNOSIS — H9193 Unspecified hearing loss, bilateral: Secondary | ICD-10-CM | POA: Diagnosis not present

## 2021-10-08 DIAGNOSIS — E1151 Type 2 diabetes mellitus with diabetic peripheral angiopathy without gangrene: Secondary | ICD-10-CM | POA: Diagnosis not present

## 2021-10-08 DIAGNOSIS — Z9181 History of falling: Secondary | ICD-10-CM | POA: Diagnosis not present

## 2021-10-08 DIAGNOSIS — I129 Hypertensive chronic kidney disease with stage 1 through stage 4 chronic kidney disease, or unspecified chronic kidney disease: Secondary | ICD-10-CM | POA: Diagnosis not present

## 2021-10-08 DIAGNOSIS — M48061 Spinal stenosis, lumbar region without neurogenic claudication: Secondary | ICD-10-CM | POA: Diagnosis not present

## 2021-10-08 DIAGNOSIS — Z89422 Acquired absence of other left toe(s): Secondary | ICD-10-CM | POA: Diagnosis not present

## 2021-10-08 DIAGNOSIS — S72112D Displaced fracture of greater trochanter of left femur, subsequent encounter for closed fracture with routine healing: Secondary | ICD-10-CM | POA: Diagnosis not present

## 2021-10-08 DIAGNOSIS — D631 Anemia in chronic kidney disease: Secondary | ICD-10-CM | POA: Diagnosis not present

## 2021-10-08 DIAGNOSIS — M1712 Unilateral primary osteoarthritis, left knee: Secondary | ICD-10-CM | POA: Diagnosis not present

## 2021-10-08 DIAGNOSIS — E1122 Type 2 diabetes mellitus with diabetic chronic kidney disease: Secondary | ICD-10-CM | POA: Diagnosis not present

## 2021-10-08 DIAGNOSIS — M160 Bilateral primary osteoarthritis of hip: Secondary | ICD-10-CM | POA: Diagnosis not present

## 2021-10-08 DIAGNOSIS — K219 Gastro-esophageal reflux disease without esophagitis: Secondary | ICD-10-CM | POA: Diagnosis not present

## 2021-10-08 DIAGNOSIS — N1831 Chronic kidney disease, stage 3a: Secondary | ICD-10-CM | POA: Diagnosis not present

## 2021-10-08 DIAGNOSIS — L89153 Pressure ulcer of sacral region, stage 3: Secondary | ICD-10-CM | POA: Diagnosis not present

## 2021-10-08 DIAGNOSIS — Z7982 Long term (current) use of aspirin: Secondary | ICD-10-CM | POA: Diagnosis not present

## 2021-10-12 DIAGNOSIS — E1151 Type 2 diabetes mellitus with diabetic peripheral angiopathy without gangrene: Secondary | ICD-10-CM | POA: Diagnosis not present

## 2021-10-12 DIAGNOSIS — M48061 Spinal stenosis, lumbar region without neurogenic claudication: Secondary | ICD-10-CM | POA: Diagnosis not present

## 2021-10-12 DIAGNOSIS — E1122 Type 2 diabetes mellitus with diabetic chronic kidney disease: Secondary | ICD-10-CM | POA: Diagnosis not present

## 2021-10-12 DIAGNOSIS — Z9181 History of falling: Secondary | ICD-10-CM | POA: Diagnosis not present

## 2021-10-12 DIAGNOSIS — D631 Anemia in chronic kidney disease: Secondary | ICD-10-CM | POA: Diagnosis not present

## 2021-10-12 DIAGNOSIS — L89153 Pressure ulcer of sacral region, stage 3: Secondary | ICD-10-CM | POA: Diagnosis not present

## 2021-10-12 DIAGNOSIS — E782 Mixed hyperlipidemia: Secondary | ICD-10-CM | POA: Diagnosis not present

## 2021-10-12 DIAGNOSIS — E1142 Type 2 diabetes mellitus with diabetic polyneuropathy: Secondary | ICD-10-CM | POA: Diagnosis not present

## 2021-10-12 DIAGNOSIS — Z89422 Acquired absence of other left toe(s): Secondary | ICD-10-CM | POA: Diagnosis not present

## 2021-10-12 DIAGNOSIS — H9193 Unspecified hearing loss, bilateral: Secondary | ICD-10-CM | POA: Diagnosis not present

## 2021-10-12 DIAGNOSIS — I129 Hypertensive chronic kidney disease with stage 1 through stage 4 chronic kidney disease, or unspecified chronic kidney disease: Secondary | ICD-10-CM | POA: Diagnosis not present

## 2021-10-12 DIAGNOSIS — Z7984 Long term (current) use of oral hypoglycemic drugs: Secondary | ICD-10-CM | POA: Diagnosis not present

## 2021-10-12 DIAGNOSIS — Z7982 Long term (current) use of aspirin: Secondary | ICD-10-CM | POA: Diagnosis not present

## 2021-10-12 DIAGNOSIS — M160 Bilateral primary osteoarthritis of hip: Secondary | ICD-10-CM | POA: Diagnosis not present

## 2021-10-12 DIAGNOSIS — N1831 Chronic kidney disease, stage 3a: Secondary | ICD-10-CM | POA: Diagnosis not present

## 2021-10-12 DIAGNOSIS — M1712 Unilateral primary osteoarthritis, left knee: Secondary | ICD-10-CM | POA: Diagnosis not present

## 2021-10-12 DIAGNOSIS — K219 Gastro-esophageal reflux disease without esophagitis: Secondary | ICD-10-CM | POA: Diagnosis not present

## 2021-10-12 DIAGNOSIS — E1169 Type 2 diabetes mellitus with other specified complication: Secondary | ICD-10-CM | POA: Diagnosis not present

## 2021-10-12 DIAGNOSIS — S72112D Displaced fracture of greater trochanter of left femur, subsequent encounter for closed fracture with routine healing: Secondary | ICD-10-CM | POA: Diagnosis not present

## 2021-10-13 DIAGNOSIS — E1151 Type 2 diabetes mellitus with diabetic peripheral angiopathy without gangrene: Secondary | ICD-10-CM | POA: Diagnosis not present

## 2021-10-13 DIAGNOSIS — E11621 Type 2 diabetes mellitus with foot ulcer: Secondary | ICD-10-CM | POA: Diagnosis not present

## 2021-10-13 DIAGNOSIS — Z7984 Long term (current) use of oral hypoglycemic drugs: Secondary | ICD-10-CM | POA: Diagnosis not present

## 2021-10-13 DIAGNOSIS — N1831 Chronic kidney disease, stage 3a: Secondary | ICD-10-CM | POA: Diagnosis not present

## 2021-10-13 DIAGNOSIS — E1142 Type 2 diabetes mellitus with diabetic polyneuropathy: Secondary | ICD-10-CM | POA: Diagnosis not present

## 2021-10-13 DIAGNOSIS — L97509 Non-pressure chronic ulcer of other part of unspecified foot with unspecified severity: Secondary | ICD-10-CM | POA: Diagnosis not present

## 2021-10-13 DIAGNOSIS — E1169 Type 2 diabetes mellitus with other specified complication: Secondary | ICD-10-CM | POA: Diagnosis not present

## 2021-10-13 DIAGNOSIS — Z89422 Acquired absence of other left toe(s): Secondary | ICD-10-CM | POA: Diagnosis not present

## 2021-10-13 DIAGNOSIS — M160 Bilateral primary osteoarthritis of hip: Secondary | ICD-10-CM | POA: Diagnosis not present

## 2021-10-13 DIAGNOSIS — N1832 Chronic kidney disease, stage 3b: Secondary | ICD-10-CM | POA: Diagnosis not present

## 2021-10-13 DIAGNOSIS — K219 Gastro-esophageal reflux disease without esophagitis: Secondary | ICD-10-CM | POA: Diagnosis not present

## 2021-10-13 DIAGNOSIS — S72112D Displaced fracture of greater trochanter of left femur, subsequent encounter for closed fracture with routine healing: Secondary | ICD-10-CM | POA: Diagnosis not present

## 2021-10-13 DIAGNOSIS — I129 Hypertensive chronic kidney disease with stage 1 through stage 4 chronic kidney disease, or unspecified chronic kidney disease: Secondary | ICD-10-CM | POA: Diagnosis not present

## 2021-10-13 DIAGNOSIS — L89153 Pressure ulcer of sacral region, stage 3: Secondary | ICD-10-CM | POA: Diagnosis not present

## 2021-10-13 DIAGNOSIS — M48061 Spinal stenosis, lumbar region without neurogenic claudication: Secondary | ICD-10-CM | POA: Diagnosis not present

## 2021-10-13 DIAGNOSIS — Z9181 History of falling: Secondary | ICD-10-CM | POA: Diagnosis not present

## 2021-10-13 DIAGNOSIS — H9193 Unspecified hearing loss, bilateral: Secondary | ICD-10-CM | POA: Diagnosis not present

## 2021-10-13 DIAGNOSIS — E782 Mixed hyperlipidemia: Secondary | ICD-10-CM | POA: Diagnosis not present

## 2021-10-13 DIAGNOSIS — Z7982 Long term (current) use of aspirin: Secondary | ICD-10-CM | POA: Diagnosis not present

## 2021-10-13 DIAGNOSIS — E1122 Type 2 diabetes mellitus with diabetic chronic kidney disease: Secondary | ICD-10-CM | POA: Diagnosis not present

## 2021-10-13 DIAGNOSIS — M1712 Unilateral primary osteoarthritis, left knee: Secondary | ICD-10-CM | POA: Diagnosis not present

## 2021-10-13 DIAGNOSIS — D631 Anemia in chronic kidney disease: Secondary | ICD-10-CM | POA: Diagnosis not present

## 2021-10-15 DIAGNOSIS — E1151 Type 2 diabetes mellitus with diabetic peripheral angiopathy without gangrene: Secondary | ICD-10-CM | POA: Diagnosis not present

## 2021-10-15 DIAGNOSIS — Z7984 Long term (current) use of oral hypoglycemic drugs: Secondary | ICD-10-CM | POA: Diagnosis not present

## 2021-10-15 DIAGNOSIS — H9193 Unspecified hearing loss, bilateral: Secondary | ICD-10-CM | POA: Diagnosis not present

## 2021-10-15 DIAGNOSIS — E782 Mixed hyperlipidemia: Secondary | ICD-10-CM | POA: Diagnosis not present

## 2021-10-15 DIAGNOSIS — L89153 Pressure ulcer of sacral region, stage 3: Secondary | ICD-10-CM | POA: Diagnosis not present

## 2021-10-15 DIAGNOSIS — E1122 Type 2 diabetes mellitus with diabetic chronic kidney disease: Secondary | ICD-10-CM | POA: Diagnosis not present

## 2021-10-15 DIAGNOSIS — K219 Gastro-esophageal reflux disease without esophagitis: Secondary | ICD-10-CM | POA: Diagnosis not present

## 2021-10-15 DIAGNOSIS — E1169 Type 2 diabetes mellitus with other specified complication: Secondary | ICD-10-CM | POA: Diagnosis not present

## 2021-10-15 DIAGNOSIS — N1831 Chronic kidney disease, stage 3a: Secondary | ICD-10-CM | POA: Diagnosis not present

## 2021-10-15 DIAGNOSIS — M1712 Unilateral primary osteoarthritis, left knee: Secondary | ICD-10-CM | POA: Diagnosis not present

## 2021-10-15 DIAGNOSIS — Z89422 Acquired absence of other left toe(s): Secondary | ICD-10-CM | POA: Diagnosis not present

## 2021-10-15 DIAGNOSIS — D631 Anemia in chronic kidney disease: Secondary | ICD-10-CM | POA: Diagnosis not present

## 2021-10-15 DIAGNOSIS — Z7982 Long term (current) use of aspirin: Secondary | ICD-10-CM | POA: Diagnosis not present

## 2021-10-15 DIAGNOSIS — Z9181 History of falling: Secondary | ICD-10-CM | POA: Diagnosis not present

## 2021-10-15 DIAGNOSIS — S72112D Displaced fracture of greater trochanter of left femur, subsequent encounter for closed fracture with routine healing: Secondary | ICD-10-CM | POA: Diagnosis not present

## 2021-10-15 DIAGNOSIS — I129 Hypertensive chronic kidney disease with stage 1 through stage 4 chronic kidney disease, or unspecified chronic kidney disease: Secondary | ICD-10-CM | POA: Diagnosis not present

## 2021-10-15 DIAGNOSIS — E1142 Type 2 diabetes mellitus with diabetic polyneuropathy: Secondary | ICD-10-CM | POA: Diagnosis not present

## 2021-10-15 DIAGNOSIS — M160 Bilateral primary osteoarthritis of hip: Secondary | ICD-10-CM | POA: Diagnosis not present

## 2021-10-15 DIAGNOSIS — M48061 Spinal stenosis, lumbar region without neurogenic claudication: Secondary | ICD-10-CM | POA: Diagnosis not present

## 2021-10-19 DIAGNOSIS — E1169 Type 2 diabetes mellitus with other specified complication: Secondary | ICD-10-CM | POA: Diagnosis not present

## 2021-10-19 DIAGNOSIS — Z7984 Long term (current) use of oral hypoglycemic drugs: Secondary | ICD-10-CM | POA: Diagnosis not present

## 2021-10-19 DIAGNOSIS — M160 Bilateral primary osteoarthritis of hip: Secondary | ICD-10-CM | POA: Diagnosis not present

## 2021-10-19 DIAGNOSIS — I129 Hypertensive chronic kidney disease with stage 1 through stage 4 chronic kidney disease, or unspecified chronic kidney disease: Secondary | ICD-10-CM | POA: Diagnosis not present

## 2021-10-19 DIAGNOSIS — E782 Mixed hyperlipidemia: Secondary | ICD-10-CM | POA: Diagnosis not present

## 2021-10-19 DIAGNOSIS — S72112D Displaced fracture of greater trochanter of left femur, subsequent encounter for closed fracture with routine healing: Secondary | ICD-10-CM | POA: Diagnosis not present

## 2021-10-19 DIAGNOSIS — Z9181 History of falling: Secondary | ICD-10-CM | POA: Diagnosis not present

## 2021-10-19 DIAGNOSIS — Z89422 Acquired absence of other left toe(s): Secondary | ICD-10-CM | POA: Diagnosis not present

## 2021-10-19 DIAGNOSIS — Z7982 Long term (current) use of aspirin: Secondary | ICD-10-CM | POA: Diagnosis not present

## 2021-10-19 DIAGNOSIS — E1122 Type 2 diabetes mellitus with diabetic chronic kidney disease: Secondary | ICD-10-CM | POA: Diagnosis not present

## 2021-10-19 DIAGNOSIS — H9193 Unspecified hearing loss, bilateral: Secondary | ICD-10-CM | POA: Diagnosis not present

## 2021-10-19 DIAGNOSIS — D631 Anemia in chronic kidney disease: Secondary | ICD-10-CM | POA: Diagnosis not present

## 2021-10-19 DIAGNOSIS — M1712 Unilateral primary osteoarthritis, left knee: Secondary | ICD-10-CM | POA: Diagnosis not present

## 2021-10-19 DIAGNOSIS — M48061 Spinal stenosis, lumbar region without neurogenic claudication: Secondary | ICD-10-CM | POA: Diagnosis not present

## 2021-10-19 DIAGNOSIS — E1151 Type 2 diabetes mellitus with diabetic peripheral angiopathy without gangrene: Secondary | ICD-10-CM | POA: Diagnosis not present

## 2021-10-19 DIAGNOSIS — L89153 Pressure ulcer of sacral region, stage 3: Secondary | ICD-10-CM | POA: Diagnosis not present

## 2021-10-19 DIAGNOSIS — N1831 Chronic kidney disease, stage 3a: Secondary | ICD-10-CM | POA: Diagnosis not present

## 2021-10-19 DIAGNOSIS — E1142 Type 2 diabetes mellitus with diabetic polyneuropathy: Secondary | ICD-10-CM | POA: Diagnosis not present

## 2021-10-19 DIAGNOSIS — K219 Gastro-esophageal reflux disease without esophagitis: Secondary | ICD-10-CM | POA: Diagnosis not present

## 2021-10-20 ENCOUNTER — Other Ambulatory Visit: Payer: Self-pay | Admitting: Endocrinology

## 2021-10-20 DIAGNOSIS — E1142 Type 2 diabetes mellitus with diabetic polyneuropathy: Secondary | ICD-10-CM | POA: Diagnosis not present

## 2021-10-20 DIAGNOSIS — E1169 Type 2 diabetes mellitus with other specified complication: Secondary | ICD-10-CM | POA: Diagnosis not present

## 2021-10-20 DIAGNOSIS — L89153 Pressure ulcer of sacral region, stage 3: Secondary | ICD-10-CM | POA: Diagnosis not present

## 2021-10-20 DIAGNOSIS — H9193 Unspecified hearing loss, bilateral: Secondary | ICD-10-CM | POA: Diagnosis not present

## 2021-10-20 DIAGNOSIS — E1151 Type 2 diabetes mellitus with diabetic peripheral angiopathy without gangrene: Secondary | ICD-10-CM | POA: Diagnosis not present

## 2021-10-20 DIAGNOSIS — M1712 Unilateral primary osteoarthritis, left knee: Secondary | ICD-10-CM | POA: Diagnosis not present

## 2021-10-20 DIAGNOSIS — Z7984 Long term (current) use of oral hypoglycemic drugs: Secondary | ICD-10-CM | POA: Diagnosis not present

## 2021-10-20 DIAGNOSIS — Z89422 Acquired absence of other left toe(s): Secondary | ICD-10-CM | POA: Diagnosis not present

## 2021-10-20 DIAGNOSIS — M160 Bilateral primary osteoarthritis of hip: Secondary | ICD-10-CM | POA: Diagnosis not present

## 2021-10-20 DIAGNOSIS — S72112D Displaced fracture of greater trochanter of left femur, subsequent encounter for closed fracture with routine healing: Secondary | ICD-10-CM | POA: Diagnosis not present

## 2021-10-20 DIAGNOSIS — D631 Anemia in chronic kidney disease: Secondary | ICD-10-CM | POA: Diagnosis not present

## 2021-10-20 DIAGNOSIS — E782 Mixed hyperlipidemia: Secondary | ICD-10-CM | POA: Diagnosis not present

## 2021-10-20 DIAGNOSIS — K219 Gastro-esophageal reflux disease without esophagitis: Secondary | ICD-10-CM | POA: Diagnosis not present

## 2021-10-20 DIAGNOSIS — Z7982 Long term (current) use of aspirin: Secondary | ICD-10-CM | POA: Diagnosis not present

## 2021-10-20 DIAGNOSIS — N1831 Chronic kidney disease, stage 3a: Secondary | ICD-10-CM | POA: Diagnosis not present

## 2021-10-20 DIAGNOSIS — I129 Hypertensive chronic kidney disease with stage 1 through stage 4 chronic kidney disease, or unspecified chronic kidney disease: Secondary | ICD-10-CM | POA: Diagnosis not present

## 2021-10-20 DIAGNOSIS — M48061 Spinal stenosis, lumbar region without neurogenic claudication: Secondary | ICD-10-CM | POA: Diagnosis not present

## 2021-10-20 DIAGNOSIS — Z9181 History of falling: Secondary | ICD-10-CM | POA: Diagnosis not present

## 2021-10-20 DIAGNOSIS — E1122 Type 2 diabetes mellitus with diabetic chronic kidney disease: Secondary | ICD-10-CM | POA: Diagnosis not present

## 2021-10-22 DIAGNOSIS — L89153 Pressure ulcer of sacral region, stage 3: Secondary | ICD-10-CM | POA: Diagnosis not present

## 2021-10-22 DIAGNOSIS — M1712 Unilateral primary osteoarthritis, left knee: Secondary | ICD-10-CM | POA: Diagnosis not present

## 2021-10-22 DIAGNOSIS — S72112D Displaced fracture of greater trochanter of left femur, subsequent encounter for closed fracture with routine healing: Secondary | ICD-10-CM | POA: Diagnosis not present

## 2021-10-22 DIAGNOSIS — Z7984 Long term (current) use of oral hypoglycemic drugs: Secondary | ICD-10-CM | POA: Diagnosis not present

## 2021-10-22 DIAGNOSIS — I129 Hypertensive chronic kidney disease with stage 1 through stage 4 chronic kidney disease, or unspecified chronic kidney disease: Secondary | ICD-10-CM | POA: Diagnosis not present

## 2021-10-22 DIAGNOSIS — Z89422 Acquired absence of other left toe(s): Secondary | ICD-10-CM | POA: Diagnosis not present

## 2021-10-22 DIAGNOSIS — E1169 Type 2 diabetes mellitus with other specified complication: Secondary | ICD-10-CM | POA: Diagnosis not present

## 2021-10-22 DIAGNOSIS — E1142 Type 2 diabetes mellitus with diabetic polyneuropathy: Secondary | ICD-10-CM | POA: Diagnosis not present

## 2021-10-22 DIAGNOSIS — E1151 Type 2 diabetes mellitus with diabetic peripheral angiopathy without gangrene: Secondary | ICD-10-CM | POA: Diagnosis not present

## 2021-10-22 DIAGNOSIS — N1831 Chronic kidney disease, stage 3a: Secondary | ICD-10-CM | POA: Diagnosis not present

## 2021-10-22 DIAGNOSIS — Z7982 Long term (current) use of aspirin: Secondary | ICD-10-CM | POA: Diagnosis not present

## 2021-10-22 DIAGNOSIS — E782 Mixed hyperlipidemia: Secondary | ICD-10-CM | POA: Diagnosis not present

## 2021-10-22 DIAGNOSIS — M160 Bilateral primary osteoarthritis of hip: Secondary | ICD-10-CM | POA: Diagnosis not present

## 2021-10-22 DIAGNOSIS — K219 Gastro-esophageal reflux disease without esophagitis: Secondary | ICD-10-CM | POA: Diagnosis not present

## 2021-10-22 DIAGNOSIS — D631 Anemia in chronic kidney disease: Secondary | ICD-10-CM | POA: Diagnosis not present

## 2021-10-22 DIAGNOSIS — H9193 Unspecified hearing loss, bilateral: Secondary | ICD-10-CM | POA: Diagnosis not present

## 2021-10-22 DIAGNOSIS — M48061 Spinal stenosis, lumbar region without neurogenic claudication: Secondary | ICD-10-CM | POA: Diagnosis not present

## 2021-10-22 DIAGNOSIS — Z9181 History of falling: Secondary | ICD-10-CM | POA: Diagnosis not present

## 2021-10-22 DIAGNOSIS — E1122 Type 2 diabetes mellitus with diabetic chronic kidney disease: Secondary | ICD-10-CM | POA: Diagnosis not present

## 2021-10-24 DIAGNOSIS — L89323 Pressure ulcer of left buttock, stage 3: Secondary | ICD-10-CM | POA: Diagnosis not present

## 2021-10-24 DIAGNOSIS — L89313 Pressure ulcer of right buttock, stage 3: Secondary | ICD-10-CM | POA: Diagnosis not present

## 2021-10-27 DIAGNOSIS — K219 Gastro-esophageal reflux disease without esophagitis: Secondary | ICD-10-CM | POA: Diagnosis not present

## 2021-10-27 DIAGNOSIS — N1831 Chronic kidney disease, stage 3a: Secondary | ICD-10-CM | POA: Diagnosis not present

## 2021-10-27 DIAGNOSIS — L97529 Non-pressure chronic ulcer of other part of left foot with unspecified severity: Secondary | ICD-10-CM | POA: Diagnosis not present

## 2021-10-27 DIAGNOSIS — M48061 Spinal stenosis, lumbar region without neurogenic claudication: Secondary | ICD-10-CM | POA: Diagnosis not present

## 2021-10-27 DIAGNOSIS — E782 Mixed hyperlipidemia: Secondary | ICD-10-CM | POA: Diagnosis not present

## 2021-10-27 DIAGNOSIS — D631 Anemia in chronic kidney disease: Secondary | ICD-10-CM | POA: Diagnosis not present

## 2021-10-27 DIAGNOSIS — M160 Bilateral primary osteoarthritis of hip: Secondary | ICD-10-CM | POA: Diagnosis not present

## 2021-10-27 DIAGNOSIS — Z7984 Long term (current) use of oral hypoglycemic drugs: Secondary | ICD-10-CM | POA: Diagnosis not present

## 2021-10-27 DIAGNOSIS — H9193 Unspecified hearing loss, bilateral: Secondary | ICD-10-CM | POA: Diagnosis not present

## 2021-10-27 DIAGNOSIS — Z89422 Acquired absence of other left toe(s): Secondary | ICD-10-CM | POA: Diagnosis not present

## 2021-10-27 DIAGNOSIS — M1712 Unilateral primary osteoarthritis, left knee: Secondary | ICD-10-CM | POA: Diagnosis not present

## 2021-10-27 DIAGNOSIS — L89153 Pressure ulcer of sacral region, stage 3: Secondary | ICD-10-CM | POA: Diagnosis not present

## 2021-10-27 DIAGNOSIS — E1169 Type 2 diabetes mellitus with other specified complication: Secondary | ICD-10-CM | POA: Diagnosis not present

## 2021-10-27 DIAGNOSIS — Z9181 History of falling: Secondary | ICD-10-CM | POA: Diagnosis not present

## 2021-10-27 DIAGNOSIS — E1151 Type 2 diabetes mellitus with diabetic peripheral angiopathy without gangrene: Secondary | ICD-10-CM | POA: Diagnosis not present

## 2021-10-27 DIAGNOSIS — E1142 Type 2 diabetes mellitus with diabetic polyneuropathy: Secondary | ICD-10-CM | POA: Diagnosis not present

## 2021-10-27 DIAGNOSIS — Z7982 Long term (current) use of aspirin: Secondary | ICD-10-CM | POA: Diagnosis not present

## 2021-10-27 DIAGNOSIS — I129 Hypertensive chronic kidney disease with stage 1 through stage 4 chronic kidney disease, or unspecified chronic kidney disease: Secondary | ICD-10-CM | POA: Diagnosis not present

## 2021-10-27 DIAGNOSIS — E1122 Type 2 diabetes mellitus with diabetic chronic kidney disease: Secondary | ICD-10-CM | POA: Diagnosis not present

## 2021-10-27 DIAGNOSIS — E11621 Type 2 diabetes mellitus with foot ulcer: Secondary | ICD-10-CM | POA: Diagnosis not present

## 2021-10-27 DIAGNOSIS — S72112D Displaced fracture of greater trochanter of left femur, subsequent encounter for closed fracture with routine healing: Secondary | ICD-10-CM | POA: Diagnosis not present

## 2021-10-29 DIAGNOSIS — M160 Bilateral primary osteoarthritis of hip: Secondary | ICD-10-CM | POA: Diagnosis not present

## 2021-10-29 DIAGNOSIS — Z7982 Long term (current) use of aspirin: Secondary | ICD-10-CM | POA: Diagnosis not present

## 2021-10-29 DIAGNOSIS — E782 Mixed hyperlipidemia: Secondary | ICD-10-CM | POA: Diagnosis not present

## 2021-10-29 DIAGNOSIS — Z7984 Long term (current) use of oral hypoglycemic drugs: Secondary | ICD-10-CM | POA: Diagnosis not present

## 2021-10-29 DIAGNOSIS — I129 Hypertensive chronic kidney disease with stage 1 through stage 4 chronic kidney disease, or unspecified chronic kidney disease: Secondary | ICD-10-CM | POA: Diagnosis not present

## 2021-10-29 DIAGNOSIS — Z9181 History of falling: Secondary | ICD-10-CM | POA: Diagnosis not present

## 2021-10-29 DIAGNOSIS — N1831 Chronic kidney disease, stage 3a: Secondary | ICD-10-CM | POA: Diagnosis not present

## 2021-10-29 DIAGNOSIS — L89153 Pressure ulcer of sacral region, stage 3: Secondary | ICD-10-CM | POA: Diagnosis not present

## 2021-10-29 DIAGNOSIS — D631 Anemia in chronic kidney disease: Secondary | ICD-10-CM | POA: Diagnosis not present

## 2021-10-29 DIAGNOSIS — H9193 Unspecified hearing loss, bilateral: Secondary | ICD-10-CM | POA: Diagnosis not present

## 2021-10-29 DIAGNOSIS — K219 Gastro-esophageal reflux disease without esophagitis: Secondary | ICD-10-CM | POA: Diagnosis not present

## 2021-10-29 DIAGNOSIS — Z89422 Acquired absence of other left toe(s): Secondary | ICD-10-CM | POA: Diagnosis not present

## 2021-10-29 DIAGNOSIS — E1122 Type 2 diabetes mellitus with diabetic chronic kidney disease: Secondary | ICD-10-CM | POA: Diagnosis not present

## 2021-10-29 DIAGNOSIS — E1142 Type 2 diabetes mellitus with diabetic polyneuropathy: Secondary | ICD-10-CM | POA: Diagnosis not present

## 2021-10-29 DIAGNOSIS — E1169 Type 2 diabetes mellitus with other specified complication: Secondary | ICD-10-CM | POA: Diagnosis not present

## 2021-10-29 DIAGNOSIS — M1712 Unilateral primary osteoarthritis, left knee: Secondary | ICD-10-CM | POA: Diagnosis not present

## 2021-10-29 DIAGNOSIS — M48061 Spinal stenosis, lumbar region without neurogenic claudication: Secondary | ICD-10-CM | POA: Diagnosis not present

## 2021-10-29 DIAGNOSIS — E1151 Type 2 diabetes mellitus with diabetic peripheral angiopathy without gangrene: Secondary | ICD-10-CM | POA: Diagnosis not present

## 2021-10-29 DIAGNOSIS — S72112D Displaced fracture of greater trochanter of left femur, subsequent encounter for closed fracture with routine healing: Secondary | ICD-10-CM | POA: Diagnosis not present

## 2021-10-30 DIAGNOSIS — L89313 Pressure ulcer of right buttock, stage 3: Secondary | ICD-10-CM | POA: Diagnosis not present

## 2021-10-30 DIAGNOSIS — M6281 Muscle weakness (generalized): Secondary | ICD-10-CM | POA: Diagnosis not present

## 2021-10-30 DIAGNOSIS — L89323 Pressure ulcer of left buttock, stage 3: Secondary | ICD-10-CM | POA: Diagnosis not present

## 2021-11-04 DIAGNOSIS — M160 Bilateral primary osteoarthritis of hip: Secondary | ICD-10-CM | POA: Diagnosis not present

## 2021-11-04 DIAGNOSIS — L89153 Pressure ulcer of sacral region, stage 3: Secondary | ICD-10-CM | POA: Diagnosis not present

## 2021-11-04 DIAGNOSIS — E782 Mixed hyperlipidemia: Secondary | ICD-10-CM | POA: Diagnosis not present

## 2021-11-04 DIAGNOSIS — Z7982 Long term (current) use of aspirin: Secondary | ICD-10-CM | POA: Diagnosis not present

## 2021-11-04 DIAGNOSIS — Z89422 Acquired absence of other left toe(s): Secondary | ICD-10-CM | POA: Diagnosis not present

## 2021-11-04 DIAGNOSIS — M1712 Unilateral primary osteoarthritis, left knee: Secondary | ICD-10-CM | POA: Diagnosis not present

## 2021-11-04 DIAGNOSIS — N1831 Chronic kidney disease, stage 3a: Secondary | ICD-10-CM | POA: Diagnosis not present

## 2021-11-04 DIAGNOSIS — E1151 Type 2 diabetes mellitus with diabetic peripheral angiopathy without gangrene: Secondary | ICD-10-CM | POA: Diagnosis not present

## 2021-11-04 DIAGNOSIS — D631 Anemia in chronic kidney disease: Secondary | ICD-10-CM | POA: Diagnosis not present

## 2021-11-04 DIAGNOSIS — E1142 Type 2 diabetes mellitus with diabetic polyneuropathy: Secondary | ICD-10-CM | POA: Diagnosis not present

## 2021-11-04 DIAGNOSIS — H9193 Unspecified hearing loss, bilateral: Secondary | ICD-10-CM | POA: Diagnosis not present

## 2021-11-04 DIAGNOSIS — K219 Gastro-esophageal reflux disease without esophagitis: Secondary | ICD-10-CM | POA: Diagnosis not present

## 2021-11-04 DIAGNOSIS — E1122 Type 2 diabetes mellitus with diabetic chronic kidney disease: Secondary | ICD-10-CM | POA: Diagnosis not present

## 2021-11-04 DIAGNOSIS — I129 Hypertensive chronic kidney disease with stage 1 through stage 4 chronic kidney disease, or unspecified chronic kidney disease: Secondary | ICD-10-CM | POA: Diagnosis not present

## 2021-11-04 DIAGNOSIS — S72112D Displaced fracture of greater trochanter of left femur, subsequent encounter for closed fracture with routine healing: Secondary | ICD-10-CM | POA: Diagnosis not present

## 2021-11-04 DIAGNOSIS — M48061 Spinal stenosis, lumbar region without neurogenic claudication: Secondary | ICD-10-CM | POA: Diagnosis not present

## 2021-11-04 DIAGNOSIS — Z9181 History of falling: Secondary | ICD-10-CM | POA: Diagnosis not present

## 2021-11-04 DIAGNOSIS — E1169 Type 2 diabetes mellitus with other specified complication: Secondary | ICD-10-CM | POA: Diagnosis not present

## 2021-11-04 DIAGNOSIS — Z7984 Long term (current) use of oral hypoglycemic drugs: Secondary | ICD-10-CM | POA: Diagnosis not present

## 2021-11-05 DIAGNOSIS — Z7982 Long term (current) use of aspirin: Secondary | ICD-10-CM | POA: Diagnosis not present

## 2021-11-05 DIAGNOSIS — M1712 Unilateral primary osteoarthritis, left knee: Secondary | ICD-10-CM | POA: Diagnosis not present

## 2021-11-05 DIAGNOSIS — S72112D Displaced fracture of greater trochanter of left femur, subsequent encounter for closed fracture with routine healing: Secondary | ICD-10-CM | POA: Diagnosis not present

## 2021-11-05 DIAGNOSIS — E782 Mixed hyperlipidemia: Secondary | ICD-10-CM | POA: Diagnosis not present

## 2021-11-05 DIAGNOSIS — N1831 Chronic kidney disease, stage 3a: Secondary | ICD-10-CM | POA: Diagnosis not present

## 2021-11-05 DIAGNOSIS — I129 Hypertensive chronic kidney disease with stage 1 through stage 4 chronic kidney disease, or unspecified chronic kidney disease: Secondary | ICD-10-CM | POA: Diagnosis not present

## 2021-11-05 DIAGNOSIS — E1122 Type 2 diabetes mellitus with diabetic chronic kidney disease: Secondary | ICD-10-CM | POA: Diagnosis not present

## 2021-11-05 DIAGNOSIS — Z9181 History of falling: Secondary | ICD-10-CM | POA: Diagnosis not present

## 2021-11-05 DIAGNOSIS — E1151 Type 2 diabetes mellitus with diabetic peripheral angiopathy without gangrene: Secondary | ICD-10-CM | POA: Diagnosis not present

## 2021-11-05 DIAGNOSIS — M160 Bilateral primary osteoarthritis of hip: Secondary | ICD-10-CM | POA: Diagnosis not present

## 2021-11-05 DIAGNOSIS — L89153 Pressure ulcer of sacral region, stage 3: Secondary | ICD-10-CM | POA: Diagnosis not present

## 2021-11-05 DIAGNOSIS — E1169 Type 2 diabetes mellitus with other specified complication: Secondary | ICD-10-CM | POA: Diagnosis not present

## 2021-11-05 DIAGNOSIS — E1142 Type 2 diabetes mellitus with diabetic polyneuropathy: Secondary | ICD-10-CM | POA: Diagnosis not present

## 2021-11-05 DIAGNOSIS — M48061 Spinal stenosis, lumbar region without neurogenic claudication: Secondary | ICD-10-CM | POA: Diagnosis not present

## 2021-11-05 DIAGNOSIS — Z89422 Acquired absence of other left toe(s): Secondary | ICD-10-CM | POA: Diagnosis not present

## 2021-11-05 DIAGNOSIS — Z7984 Long term (current) use of oral hypoglycemic drugs: Secondary | ICD-10-CM | POA: Diagnosis not present

## 2021-11-05 DIAGNOSIS — K219 Gastro-esophageal reflux disease without esophagitis: Secondary | ICD-10-CM | POA: Diagnosis not present

## 2021-11-05 DIAGNOSIS — D631 Anemia in chronic kidney disease: Secondary | ICD-10-CM | POA: Diagnosis not present

## 2021-11-05 DIAGNOSIS — H9193 Unspecified hearing loss, bilateral: Secondary | ICD-10-CM | POA: Diagnosis not present

## 2021-11-06 DIAGNOSIS — D631 Anemia in chronic kidney disease: Secondary | ICD-10-CM | POA: Diagnosis not present

## 2021-11-06 DIAGNOSIS — K219 Gastro-esophageal reflux disease without esophagitis: Secondary | ICD-10-CM | POA: Diagnosis not present

## 2021-11-06 DIAGNOSIS — E1122 Type 2 diabetes mellitus with diabetic chronic kidney disease: Secondary | ICD-10-CM | POA: Diagnosis not present

## 2021-11-06 DIAGNOSIS — Z89422 Acquired absence of other left toe(s): Secondary | ICD-10-CM | POA: Diagnosis not present

## 2021-11-06 DIAGNOSIS — E782 Mixed hyperlipidemia: Secondary | ICD-10-CM | POA: Diagnosis not present

## 2021-11-06 DIAGNOSIS — N1831 Chronic kidney disease, stage 3a: Secondary | ICD-10-CM | POA: Diagnosis not present

## 2021-11-06 DIAGNOSIS — E1151 Type 2 diabetes mellitus with diabetic peripheral angiopathy without gangrene: Secondary | ICD-10-CM | POA: Diagnosis not present

## 2021-11-06 DIAGNOSIS — E1142 Type 2 diabetes mellitus with diabetic polyneuropathy: Secondary | ICD-10-CM | POA: Diagnosis not present

## 2021-11-06 DIAGNOSIS — E1169 Type 2 diabetes mellitus with other specified complication: Secondary | ICD-10-CM | POA: Diagnosis not present

## 2021-11-06 DIAGNOSIS — Z7984 Long term (current) use of oral hypoglycemic drugs: Secondary | ICD-10-CM | POA: Diagnosis not present

## 2021-11-06 DIAGNOSIS — M48061 Spinal stenosis, lumbar region without neurogenic claudication: Secondary | ICD-10-CM | POA: Diagnosis not present

## 2021-11-06 DIAGNOSIS — M160 Bilateral primary osteoarthritis of hip: Secondary | ICD-10-CM | POA: Diagnosis not present

## 2021-11-06 DIAGNOSIS — M1712 Unilateral primary osteoarthritis, left knee: Secondary | ICD-10-CM | POA: Diagnosis not present

## 2021-11-06 DIAGNOSIS — S72112D Displaced fracture of greater trochanter of left femur, subsequent encounter for closed fracture with routine healing: Secondary | ICD-10-CM | POA: Diagnosis not present

## 2021-11-06 DIAGNOSIS — Z9181 History of falling: Secondary | ICD-10-CM | POA: Diagnosis not present

## 2021-11-06 DIAGNOSIS — Z7982 Long term (current) use of aspirin: Secondary | ICD-10-CM | POA: Diagnosis not present

## 2021-11-06 DIAGNOSIS — I129 Hypertensive chronic kidney disease with stage 1 through stage 4 chronic kidney disease, or unspecified chronic kidney disease: Secondary | ICD-10-CM | POA: Diagnosis not present

## 2021-11-06 DIAGNOSIS — L89153 Pressure ulcer of sacral region, stage 3: Secondary | ICD-10-CM | POA: Diagnosis not present

## 2021-11-06 DIAGNOSIS — H9193 Unspecified hearing loss, bilateral: Secondary | ICD-10-CM | POA: Diagnosis not present

## 2021-11-10 DIAGNOSIS — I129 Hypertensive chronic kidney disease with stage 1 through stage 4 chronic kidney disease, or unspecified chronic kidney disease: Secondary | ICD-10-CM | POA: Diagnosis not present

## 2021-11-10 DIAGNOSIS — L89153 Pressure ulcer of sacral region, stage 3: Secondary | ICD-10-CM | POA: Diagnosis not present

## 2021-11-10 DIAGNOSIS — E782 Mixed hyperlipidemia: Secondary | ICD-10-CM | POA: Diagnosis not present

## 2021-11-10 DIAGNOSIS — Z9181 History of falling: Secondary | ICD-10-CM | POA: Diagnosis not present

## 2021-11-10 DIAGNOSIS — E1122 Type 2 diabetes mellitus with diabetic chronic kidney disease: Secondary | ICD-10-CM | POA: Diagnosis not present

## 2021-11-10 DIAGNOSIS — E1169 Type 2 diabetes mellitus with other specified complication: Secondary | ICD-10-CM | POA: Diagnosis not present

## 2021-11-10 DIAGNOSIS — Z7984 Long term (current) use of oral hypoglycemic drugs: Secondary | ICD-10-CM | POA: Diagnosis not present

## 2021-11-10 DIAGNOSIS — E1151 Type 2 diabetes mellitus with diabetic peripheral angiopathy without gangrene: Secondary | ICD-10-CM | POA: Diagnosis not present

## 2021-11-10 DIAGNOSIS — Z7982 Long term (current) use of aspirin: Secondary | ICD-10-CM | POA: Diagnosis not present

## 2021-11-10 DIAGNOSIS — D631 Anemia in chronic kidney disease: Secondary | ICD-10-CM | POA: Diagnosis not present

## 2021-11-10 DIAGNOSIS — N1831 Chronic kidney disease, stage 3a: Secondary | ICD-10-CM | POA: Diagnosis not present

## 2021-11-10 DIAGNOSIS — S72112D Displaced fracture of greater trochanter of left femur, subsequent encounter for closed fracture with routine healing: Secondary | ICD-10-CM | POA: Diagnosis not present

## 2021-11-10 DIAGNOSIS — E1142 Type 2 diabetes mellitus with diabetic polyneuropathy: Secondary | ICD-10-CM | POA: Diagnosis not present

## 2021-11-10 DIAGNOSIS — M48061 Spinal stenosis, lumbar region without neurogenic claudication: Secondary | ICD-10-CM | POA: Diagnosis not present

## 2021-11-10 DIAGNOSIS — M160 Bilateral primary osteoarthritis of hip: Secondary | ICD-10-CM | POA: Diagnosis not present

## 2021-11-10 DIAGNOSIS — Z89422 Acquired absence of other left toe(s): Secondary | ICD-10-CM | POA: Diagnosis not present

## 2021-11-10 DIAGNOSIS — M1712 Unilateral primary osteoarthritis, left knee: Secondary | ICD-10-CM | POA: Diagnosis not present

## 2021-11-10 DIAGNOSIS — K219 Gastro-esophageal reflux disease without esophagitis: Secondary | ICD-10-CM | POA: Diagnosis not present

## 2021-11-10 DIAGNOSIS — H9193 Unspecified hearing loss, bilateral: Secondary | ICD-10-CM | POA: Diagnosis not present

## 2021-11-13 DIAGNOSIS — E782 Mixed hyperlipidemia: Secondary | ICD-10-CM | POA: Diagnosis not present

## 2021-11-13 DIAGNOSIS — Z89422 Acquired absence of other left toe(s): Secondary | ICD-10-CM | POA: Diagnosis not present

## 2021-11-13 DIAGNOSIS — L89153 Pressure ulcer of sacral region, stage 3: Secondary | ICD-10-CM | POA: Diagnosis not present

## 2021-11-13 DIAGNOSIS — H9193 Unspecified hearing loss, bilateral: Secondary | ICD-10-CM | POA: Diagnosis not present

## 2021-11-13 DIAGNOSIS — E1122 Type 2 diabetes mellitus with diabetic chronic kidney disease: Secondary | ICD-10-CM | POA: Diagnosis not present

## 2021-11-13 DIAGNOSIS — Z9181 History of falling: Secondary | ICD-10-CM | POA: Diagnosis not present

## 2021-11-13 DIAGNOSIS — I129 Hypertensive chronic kidney disease with stage 1 through stage 4 chronic kidney disease, or unspecified chronic kidney disease: Secondary | ICD-10-CM | POA: Diagnosis not present

## 2021-11-13 DIAGNOSIS — E1151 Type 2 diabetes mellitus with diabetic peripheral angiopathy without gangrene: Secondary | ICD-10-CM | POA: Diagnosis not present

## 2021-11-13 DIAGNOSIS — M48061 Spinal stenosis, lumbar region without neurogenic claudication: Secondary | ICD-10-CM | POA: Diagnosis not present

## 2021-11-13 DIAGNOSIS — S72112D Displaced fracture of greater trochanter of left femur, subsequent encounter for closed fracture with routine healing: Secondary | ICD-10-CM | POA: Diagnosis not present

## 2021-11-13 DIAGNOSIS — M160 Bilateral primary osteoarthritis of hip: Secondary | ICD-10-CM | POA: Diagnosis not present

## 2021-11-13 DIAGNOSIS — N1831 Chronic kidney disease, stage 3a: Secondary | ICD-10-CM | POA: Diagnosis not present

## 2021-11-13 DIAGNOSIS — K219 Gastro-esophageal reflux disease without esophagitis: Secondary | ICD-10-CM | POA: Diagnosis not present

## 2021-11-13 DIAGNOSIS — M1712 Unilateral primary osteoarthritis, left knee: Secondary | ICD-10-CM | POA: Diagnosis not present

## 2021-11-13 DIAGNOSIS — D631 Anemia in chronic kidney disease: Secondary | ICD-10-CM | POA: Diagnosis not present

## 2021-11-13 DIAGNOSIS — E1169 Type 2 diabetes mellitus with other specified complication: Secondary | ICD-10-CM | POA: Diagnosis not present

## 2021-11-13 DIAGNOSIS — Z7984 Long term (current) use of oral hypoglycemic drugs: Secondary | ICD-10-CM | POA: Diagnosis not present

## 2021-11-13 DIAGNOSIS — E1142 Type 2 diabetes mellitus with diabetic polyneuropathy: Secondary | ICD-10-CM | POA: Diagnosis not present

## 2021-11-13 DIAGNOSIS — Z7982 Long term (current) use of aspirin: Secondary | ICD-10-CM | POA: Diagnosis not present

## 2021-11-17 DIAGNOSIS — Z9181 History of falling: Secondary | ICD-10-CM | POA: Diagnosis not present

## 2021-11-17 DIAGNOSIS — E1122 Type 2 diabetes mellitus with diabetic chronic kidney disease: Secondary | ICD-10-CM | POA: Diagnosis not present

## 2021-11-17 DIAGNOSIS — Z7982 Long term (current) use of aspirin: Secondary | ICD-10-CM | POA: Diagnosis not present

## 2021-11-17 DIAGNOSIS — S72112D Displaced fracture of greater trochanter of left femur, subsequent encounter for closed fracture with routine healing: Secondary | ICD-10-CM | POA: Diagnosis not present

## 2021-11-17 DIAGNOSIS — Z89422 Acquired absence of other left toe(s): Secondary | ICD-10-CM | POA: Diagnosis not present

## 2021-11-17 DIAGNOSIS — L89153 Pressure ulcer of sacral region, stage 3: Secondary | ICD-10-CM | POA: Diagnosis not present

## 2021-11-17 DIAGNOSIS — D631 Anemia in chronic kidney disease: Secondary | ICD-10-CM | POA: Diagnosis not present

## 2021-11-17 DIAGNOSIS — E1142 Type 2 diabetes mellitus with diabetic polyneuropathy: Secondary | ICD-10-CM | POA: Diagnosis not present

## 2021-11-17 DIAGNOSIS — M160 Bilateral primary osteoarthritis of hip: Secondary | ICD-10-CM | POA: Diagnosis not present

## 2021-11-17 DIAGNOSIS — H9193 Unspecified hearing loss, bilateral: Secondary | ICD-10-CM | POA: Diagnosis not present

## 2021-11-17 DIAGNOSIS — E1151 Type 2 diabetes mellitus with diabetic peripheral angiopathy without gangrene: Secondary | ICD-10-CM | POA: Diagnosis not present

## 2021-11-17 DIAGNOSIS — M1712 Unilateral primary osteoarthritis, left knee: Secondary | ICD-10-CM | POA: Diagnosis not present

## 2021-11-17 DIAGNOSIS — Z7984 Long term (current) use of oral hypoglycemic drugs: Secondary | ICD-10-CM | POA: Diagnosis not present

## 2021-11-17 DIAGNOSIS — N1831 Chronic kidney disease, stage 3a: Secondary | ICD-10-CM | POA: Diagnosis not present

## 2021-11-17 DIAGNOSIS — I129 Hypertensive chronic kidney disease with stage 1 through stage 4 chronic kidney disease, or unspecified chronic kidney disease: Secondary | ICD-10-CM | POA: Diagnosis not present

## 2021-11-17 DIAGNOSIS — E782 Mixed hyperlipidemia: Secondary | ICD-10-CM | POA: Diagnosis not present

## 2021-11-17 DIAGNOSIS — M48061 Spinal stenosis, lumbar region without neurogenic claudication: Secondary | ICD-10-CM | POA: Diagnosis not present

## 2021-11-17 DIAGNOSIS — E1169 Type 2 diabetes mellitus with other specified complication: Secondary | ICD-10-CM | POA: Diagnosis not present

## 2021-11-17 DIAGNOSIS — K219 Gastro-esophageal reflux disease without esophagitis: Secondary | ICD-10-CM | POA: Diagnosis not present

## 2021-11-23 DIAGNOSIS — L89313 Pressure ulcer of right buttock, stage 3: Secondary | ICD-10-CM | POA: Diagnosis not present

## 2021-11-23 DIAGNOSIS — L89323 Pressure ulcer of left buttock, stage 3: Secondary | ICD-10-CM | POA: Diagnosis not present

## 2021-11-24 DIAGNOSIS — E1151 Type 2 diabetes mellitus with diabetic peripheral angiopathy without gangrene: Secondary | ICD-10-CM | POA: Diagnosis not present

## 2021-11-24 DIAGNOSIS — Z89422 Acquired absence of other left toe(s): Secondary | ICD-10-CM | POA: Diagnosis not present

## 2021-11-24 DIAGNOSIS — M160 Bilateral primary osteoarthritis of hip: Secondary | ICD-10-CM | POA: Diagnosis not present

## 2021-11-24 DIAGNOSIS — I129 Hypertensive chronic kidney disease with stage 1 through stage 4 chronic kidney disease, or unspecified chronic kidney disease: Secondary | ICD-10-CM | POA: Diagnosis not present

## 2021-11-24 DIAGNOSIS — M48061 Spinal stenosis, lumbar region without neurogenic claudication: Secondary | ICD-10-CM | POA: Diagnosis not present

## 2021-11-24 DIAGNOSIS — H9193 Unspecified hearing loss, bilateral: Secondary | ICD-10-CM | POA: Diagnosis not present

## 2021-11-24 DIAGNOSIS — K219 Gastro-esophageal reflux disease without esophagitis: Secondary | ICD-10-CM | POA: Diagnosis not present

## 2021-11-24 DIAGNOSIS — E782 Mixed hyperlipidemia: Secondary | ICD-10-CM | POA: Diagnosis not present

## 2021-11-24 DIAGNOSIS — Z9181 History of falling: Secondary | ICD-10-CM | POA: Diagnosis not present

## 2021-11-24 DIAGNOSIS — N1831 Chronic kidney disease, stage 3a: Secondary | ICD-10-CM | POA: Diagnosis not present

## 2021-11-24 DIAGNOSIS — Z7984 Long term (current) use of oral hypoglycemic drugs: Secondary | ICD-10-CM | POA: Diagnosis not present

## 2021-11-24 DIAGNOSIS — E1122 Type 2 diabetes mellitus with diabetic chronic kidney disease: Secondary | ICD-10-CM | POA: Diagnosis not present

## 2021-11-24 DIAGNOSIS — E1169 Type 2 diabetes mellitus with other specified complication: Secondary | ICD-10-CM | POA: Diagnosis not present

## 2021-11-24 DIAGNOSIS — Z7982 Long term (current) use of aspirin: Secondary | ICD-10-CM | POA: Diagnosis not present

## 2021-11-24 DIAGNOSIS — D631 Anemia in chronic kidney disease: Secondary | ICD-10-CM | POA: Diagnosis not present

## 2021-11-24 DIAGNOSIS — S72111D Displaced fracture of greater trochanter of right femur, subsequent encounter for closed fracture with routine healing: Secondary | ICD-10-CM | POA: Diagnosis not present

## 2021-11-24 DIAGNOSIS — E1142 Type 2 diabetes mellitus with diabetic polyneuropathy: Secondary | ICD-10-CM | POA: Diagnosis not present

## 2021-11-24 DIAGNOSIS — M1712 Unilateral primary osteoarthritis, left knee: Secondary | ICD-10-CM | POA: Diagnosis not present

## 2021-12-02 DIAGNOSIS — E1169 Type 2 diabetes mellitus with other specified complication: Secondary | ICD-10-CM | POA: Diagnosis not present

## 2021-12-02 DIAGNOSIS — Z9181 History of falling: Secondary | ICD-10-CM | POA: Diagnosis not present

## 2021-12-02 DIAGNOSIS — E1122 Type 2 diabetes mellitus with diabetic chronic kidney disease: Secondary | ICD-10-CM | POA: Diagnosis not present

## 2021-12-02 DIAGNOSIS — M160 Bilateral primary osteoarthritis of hip: Secondary | ICD-10-CM | POA: Diagnosis not present

## 2021-12-02 DIAGNOSIS — M1712 Unilateral primary osteoarthritis, left knee: Secondary | ICD-10-CM | POA: Diagnosis not present

## 2021-12-02 DIAGNOSIS — N1831 Chronic kidney disease, stage 3a: Secondary | ICD-10-CM | POA: Diagnosis not present

## 2021-12-02 DIAGNOSIS — M48061 Spinal stenosis, lumbar region without neurogenic claudication: Secondary | ICD-10-CM | POA: Diagnosis not present

## 2021-12-02 DIAGNOSIS — Z7982 Long term (current) use of aspirin: Secondary | ICD-10-CM | POA: Diagnosis not present

## 2021-12-02 DIAGNOSIS — K219 Gastro-esophageal reflux disease without esophagitis: Secondary | ICD-10-CM | POA: Diagnosis not present

## 2021-12-02 DIAGNOSIS — Z89422 Acquired absence of other left toe(s): Secondary | ICD-10-CM | POA: Diagnosis not present

## 2021-12-02 DIAGNOSIS — E1142 Type 2 diabetes mellitus with diabetic polyneuropathy: Secondary | ICD-10-CM | POA: Diagnosis not present

## 2021-12-02 DIAGNOSIS — S72111D Displaced fracture of greater trochanter of right femur, subsequent encounter for closed fracture with routine healing: Secondary | ICD-10-CM | POA: Diagnosis not present

## 2021-12-02 DIAGNOSIS — E1151 Type 2 diabetes mellitus with diabetic peripheral angiopathy without gangrene: Secondary | ICD-10-CM | POA: Diagnosis not present

## 2021-12-02 DIAGNOSIS — I129 Hypertensive chronic kidney disease with stage 1 through stage 4 chronic kidney disease, or unspecified chronic kidney disease: Secondary | ICD-10-CM | POA: Diagnosis not present

## 2021-12-02 DIAGNOSIS — E782 Mixed hyperlipidemia: Secondary | ICD-10-CM | POA: Diagnosis not present

## 2021-12-02 DIAGNOSIS — D631 Anemia in chronic kidney disease: Secondary | ICD-10-CM | POA: Diagnosis not present

## 2021-12-02 DIAGNOSIS — H9193 Unspecified hearing loss, bilateral: Secondary | ICD-10-CM | POA: Diagnosis not present

## 2021-12-02 DIAGNOSIS — Z7984 Long term (current) use of oral hypoglycemic drugs: Secondary | ICD-10-CM | POA: Diagnosis not present

## 2021-12-07 DIAGNOSIS — N1831 Chronic kidney disease, stage 3a: Secondary | ICD-10-CM | POA: Diagnosis not present

## 2021-12-07 DIAGNOSIS — E1122 Type 2 diabetes mellitus with diabetic chronic kidney disease: Secondary | ICD-10-CM | POA: Diagnosis not present

## 2021-12-07 DIAGNOSIS — I129 Hypertensive chronic kidney disease with stage 1 through stage 4 chronic kidney disease, or unspecified chronic kidney disease: Secondary | ICD-10-CM | POA: Diagnosis not present

## 2021-12-07 DIAGNOSIS — N183 Chronic kidney disease, stage 3 unspecified: Secondary | ICD-10-CM | POA: Diagnosis not present

## 2021-12-07 DIAGNOSIS — E785 Hyperlipidemia, unspecified: Secondary | ICD-10-CM | POA: Diagnosis not present

## 2021-12-07 DIAGNOSIS — E1169 Type 2 diabetes mellitus with other specified complication: Secondary | ICD-10-CM | POA: Diagnosis not present

## 2021-12-08 DIAGNOSIS — M160 Bilateral primary osteoarthritis of hip: Secondary | ICD-10-CM | POA: Diagnosis not present

## 2021-12-08 DIAGNOSIS — I129 Hypertensive chronic kidney disease with stage 1 through stage 4 chronic kidney disease, or unspecified chronic kidney disease: Secondary | ICD-10-CM | POA: Diagnosis not present

## 2021-12-08 DIAGNOSIS — Z89422 Acquired absence of other left toe(s): Secondary | ICD-10-CM | POA: Diagnosis not present

## 2021-12-08 DIAGNOSIS — Z7984 Long term (current) use of oral hypoglycemic drugs: Secondary | ICD-10-CM | POA: Diagnosis not present

## 2021-12-08 DIAGNOSIS — Z7982 Long term (current) use of aspirin: Secondary | ICD-10-CM | POA: Diagnosis not present

## 2021-12-08 DIAGNOSIS — N1831 Chronic kidney disease, stage 3a: Secondary | ICD-10-CM | POA: Diagnosis not present

## 2021-12-08 DIAGNOSIS — M48061 Spinal stenosis, lumbar region without neurogenic claudication: Secondary | ICD-10-CM | POA: Diagnosis not present

## 2021-12-08 DIAGNOSIS — E1169 Type 2 diabetes mellitus with other specified complication: Secondary | ICD-10-CM | POA: Diagnosis not present

## 2021-12-08 DIAGNOSIS — E1122 Type 2 diabetes mellitus with diabetic chronic kidney disease: Secondary | ICD-10-CM | POA: Diagnosis not present

## 2021-12-08 DIAGNOSIS — M1712 Unilateral primary osteoarthritis, left knee: Secondary | ICD-10-CM | POA: Diagnosis not present

## 2021-12-08 DIAGNOSIS — Z9181 History of falling: Secondary | ICD-10-CM | POA: Diagnosis not present

## 2021-12-08 DIAGNOSIS — E782 Mixed hyperlipidemia: Secondary | ICD-10-CM | POA: Diagnosis not present

## 2021-12-08 DIAGNOSIS — H9193 Unspecified hearing loss, bilateral: Secondary | ICD-10-CM | POA: Diagnosis not present

## 2021-12-08 DIAGNOSIS — E1142 Type 2 diabetes mellitus with diabetic polyneuropathy: Secondary | ICD-10-CM | POA: Diagnosis not present

## 2021-12-08 DIAGNOSIS — K219 Gastro-esophageal reflux disease without esophagitis: Secondary | ICD-10-CM | POA: Diagnosis not present

## 2021-12-08 DIAGNOSIS — S72111D Displaced fracture of greater trochanter of right femur, subsequent encounter for closed fracture with routine healing: Secondary | ICD-10-CM | POA: Diagnosis not present

## 2021-12-08 DIAGNOSIS — E1151 Type 2 diabetes mellitus with diabetic peripheral angiopathy without gangrene: Secondary | ICD-10-CM | POA: Diagnosis not present

## 2021-12-08 DIAGNOSIS — D631 Anemia in chronic kidney disease: Secondary | ICD-10-CM | POA: Diagnosis not present

## 2021-12-15 DIAGNOSIS — E1151 Type 2 diabetes mellitus with diabetic peripheral angiopathy without gangrene: Secondary | ICD-10-CM | POA: Diagnosis not present

## 2021-12-15 DIAGNOSIS — E1142 Type 2 diabetes mellitus with diabetic polyneuropathy: Secondary | ICD-10-CM | POA: Diagnosis not present

## 2021-12-15 DIAGNOSIS — Z89422 Acquired absence of other left toe(s): Secondary | ICD-10-CM | POA: Diagnosis not present

## 2021-12-15 DIAGNOSIS — Z9181 History of falling: Secondary | ICD-10-CM | POA: Diagnosis not present

## 2021-12-15 DIAGNOSIS — S72111D Displaced fracture of greater trochanter of right femur, subsequent encounter for closed fracture with routine healing: Secondary | ICD-10-CM | POA: Diagnosis not present

## 2021-12-15 DIAGNOSIS — Z7984 Long term (current) use of oral hypoglycemic drugs: Secondary | ICD-10-CM | POA: Diagnosis not present

## 2021-12-15 DIAGNOSIS — M1712 Unilateral primary osteoarthritis, left knee: Secondary | ICD-10-CM | POA: Diagnosis not present

## 2021-12-15 DIAGNOSIS — E782 Mixed hyperlipidemia: Secondary | ICD-10-CM | POA: Diagnosis not present

## 2021-12-15 DIAGNOSIS — Z7982 Long term (current) use of aspirin: Secondary | ICD-10-CM | POA: Diagnosis not present

## 2021-12-15 DIAGNOSIS — I129 Hypertensive chronic kidney disease with stage 1 through stage 4 chronic kidney disease, or unspecified chronic kidney disease: Secondary | ICD-10-CM | POA: Diagnosis not present

## 2021-12-15 DIAGNOSIS — N1831 Chronic kidney disease, stage 3a: Secondary | ICD-10-CM | POA: Diagnosis not present

## 2021-12-15 DIAGNOSIS — K219 Gastro-esophageal reflux disease without esophagitis: Secondary | ICD-10-CM | POA: Diagnosis not present

## 2021-12-15 DIAGNOSIS — D631 Anemia in chronic kidney disease: Secondary | ICD-10-CM | POA: Diagnosis not present

## 2021-12-15 DIAGNOSIS — E1122 Type 2 diabetes mellitus with diabetic chronic kidney disease: Secondary | ICD-10-CM | POA: Diagnosis not present

## 2021-12-15 DIAGNOSIS — M160 Bilateral primary osteoarthritis of hip: Secondary | ICD-10-CM | POA: Diagnosis not present

## 2021-12-15 DIAGNOSIS — H9193 Unspecified hearing loss, bilateral: Secondary | ICD-10-CM | POA: Diagnosis not present

## 2021-12-15 DIAGNOSIS — E1169 Type 2 diabetes mellitus with other specified complication: Secondary | ICD-10-CM | POA: Diagnosis not present

## 2021-12-15 DIAGNOSIS — M48061 Spinal stenosis, lumbar region without neurogenic claudication: Secondary | ICD-10-CM | POA: Diagnosis not present

## 2021-12-22 DIAGNOSIS — E1169 Type 2 diabetes mellitus with other specified complication: Secondary | ICD-10-CM | POA: Diagnosis not present

## 2021-12-22 DIAGNOSIS — E1122 Type 2 diabetes mellitus with diabetic chronic kidney disease: Secondary | ICD-10-CM | POA: Diagnosis not present

## 2021-12-22 DIAGNOSIS — Z7982 Long term (current) use of aspirin: Secondary | ICD-10-CM | POA: Diagnosis not present

## 2021-12-22 DIAGNOSIS — E1151 Type 2 diabetes mellitus with diabetic peripheral angiopathy without gangrene: Secondary | ICD-10-CM | POA: Diagnosis not present

## 2021-12-22 DIAGNOSIS — D631 Anemia in chronic kidney disease: Secondary | ICD-10-CM | POA: Diagnosis not present

## 2021-12-22 DIAGNOSIS — Z89422 Acquired absence of other left toe(s): Secondary | ICD-10-CM | POA: Diagnosis not present

## 2021-12-22 DIAGNOSIS — E1142 Type 2 diabetes mellitus with diabetic polyneuropathy: Secondary | ICD-10-CM | POA: Diagnosis not present

## 2021-12-22 DIAGNOSIS — I129 Hypertensive chronic kidney disease with stage 1 through stage 4 chronic kidney disease, or unspecified chronic kidney disease: Secondary | ICD-10-CM | POA: Diagnosis not present

## 2021-12-22 DIAGNOSIS — M1712 Unilateral primary osteoarthritis, left knee: Secondary | ICD-10-CM | POA: Diagnosis not present

## 2021-12-22 DIAGNOSIS — S72111D Displaced fracture of greater trochanter of right femur, subsequent encounter for closed fracture with routine healing: Secondary | ICD-10-CM | POA: Diagnosis not present

## 2021-12-22 DIAGNOSIS — Z7984 Long term (current) use of oral hypoglycemic drugs: Secondary | ICD-10-CM | POA: Diagnosis not present

## 2021-12-22 DIAGNOSIS — K219 Gastro-esophageal reflux disease without esophagitis: Secondary | ICD-10-CM | POA: Diagnosis not present

## 2021-12-22 DIAGNOSIS — M48061 Spinal stenosis, lumbar region without neurogenic claudication: Secondary | ICD-10-CM | POA: Diagnosis not present

## 2021-12-22 DIAGNOSIS — N1831 Chronic kidney disease, stage 3a: Secondary | ICD-10-CM | POA: Diagnosis not present

## 2021-12-22 DIAGNOSIS — Z9181 History of falling: Secondary | ICD-10-CM | POA: Diagnosis not present

## 2021-12-22 DIAGNOSIS — M160 Bilateral primary osteoarthritis of hip: Secondary | ICD-10-CM | POA: Diagnosis not present

## 2021-12-22 DIAGNOSIS — H9193 Unspecified hearing loss, bilateral: Secondary | ICD-10-CM | POA: Diagnosis not present

## 2021-12-22 DIAGNOSIS — E782 Mixed hyperlipidemia: Secondary | ICD-10-CM | POA: Diagnosis not present

## 2021-12-24 DIAGNOSIS — N1831 Chronic kidney disease, stage 3a: Secondary | ICD-10-CM | POA: Diagnosis not present

## 2021-12-24 DIAGNOSIS — L89313 Pressure ulcer of right buttock, stage 3: Secondary | ICD-10-CM | POA: Diagnosis not present

## 2021-12-24 DIAGNOSIS — L89323 Pressure ulcer of left buttock, stage 3: Secondary | ICD-10-CM | POA: Diagnosis not present

## 2021-12-29 DIAGNOSIS — K219 Gastro-esophageal reflux disease without esophagitis: Secondary | ICD-10-CM | POA: Diagnosis not present

## 2021-12-29 DIAGNOSIS — E1151 Type 2 diabetes mellitus with diabetic peripheral angiopathy without gangrene: Secondary | ICD-10-CM | POA: Diagnosis not present

## 2021-12-29 DIAGNOSIS — M160 Bilateral primary osteoarthritis of hip: Secondary | ICD-10-CM | POA: Diagnosis not present

## 2021-12-29 DIAGNOSIS — D631 Anemia in chronic kidney disease: Secondary | ICD-10-CM | POA: Diagnosis not present

## 2021-12-29 DIAGNOSIS — N1832 Chronic kidney disease, stage 3b: Secondary | ICD-10-CM | POA: Diagnosis not present

## 2021-12-29 DIAGNOSIS — E1122 Type 2 diabetes mellitus with diabetic chronic kidney disease: Secondary | ICD-10-CM | POA: Diagnosis not present

## 2021-12-29 DIAGNOSIS — N183 Chronic kidney disease, stage 3 unspecified: Secondary | ICD-10-CM | POA: Diagnosis not present

## 2021-12-29 DIAGNOSIS — Z89422 Acquired absence of other left toe(s): Secondary | ICD-10-CM | POA: Diagnosis not present

## 2021-12-29 DIAGNOSIS — R801 Persistent proteinuria, unspecified: Secondary | ICD-10-CM | POA: Diagnosis not present

## 2021-12-29 DIAGNOSIS — H9193 Unspecified hearing loss, bilateral: Secondary | ICD-10-CM | POA: Diagnosis not present

## 2021-12-29 DIAGNOSIS — E1169 Type 2 diabetes mellitus with other specified complication: Secondary | ICD-10-CM | POA: Diagnosis not present

## 2021-12-29 DIAGNOSIS — E782 Mixed hyperlipidemia: Secondary | ICD-10-CM | POA: Diagnosis not present

## 2021-12-29 DIAGNOSIS — S72111D Displaced fracture of greater trochanter of right femur, subsequent encounter for closed fracture with routine healing: Secondary | ICD-10-CM | POA: Diagnosis not present

## 2021-12-29 DIAGNOSIS — I129 Hypertensive chronic kidney disease with stage 1 through stage 4 chronic kidney disease, or unspecified chronic kidney disease: Secondary | ICD-10-CM | POA: Diagnosis not present

## 2021-12-29 DIAGNOSIS — M48061 Spinal stenosis, lumbar region without neurogenic claudication: Secondary | ICD-10-CM | POA: Diagnosis not present

## 2021-12-29 DIAGNOSIS — Z9181 History of falling: Secondary | ICD-10-CM | POA: Diagnosis not present

## 2021-12-29 DIAGNOSIS — E559 Vitamin D deficiency, unspecified: Secondary | ICD-10-CM | POA: Insufficient documentation

## 2021-12-29 DIAGNOSIS — Z7984 Long term (current) use of oral hypoglycemic drugs: Secondary | ICD-10-CM | POA: Diagnosis not present

## 2021-12-29 DIAGNOSIS — N1831 Chronic kidney disease, stage 3a: Secondary | ICD-10-CM | POA: Diagnosis not present

## 2021-12-29 DIAGNOSIS — E785 Hyperlipidemia, unspecified: Secondary | ICD-10-CM | POA: Diagnosis not present

## 2021-12-29 DIAGNOSIS — E1142 Type 2 diabetes mellitus with diabetic polyneuropathy: Secondary | ICD-10-CM | POA: Diagnosis not present

## 2021-12-29 DIAGNOSIS — M1712 Unilateral primary osteoarthritis, left knee: Secondary | ICD-10-CM | POA: Diagnosis not present

## 2021-12-29 DIAGNOSIS — M898X9 Other specified disorders of bone, unspecified site: Secondary | ICD-10-CM | POA: Diagnosis not present

## 2021-12-29 DIAGNOSIS — Z7982 Long term (current) use of aspirin: Secondary | ICD-10-CM | POA: Diagnosis not present

## 2022-01-06 DIAGNOSIS — M160 Bilateral primary osteoarthritis of hip: Secondary | ICD-10-CM | POA: Diagnosis not present

## 2022-01-06 DIAGNOSIS — M48061 Spinal stenosis, lumbar region without neurogenic claudication: Secondary | ICD-10-CM | POA: Diagnosis not present

## 2022-01-06 DIAGNOSIS — E782 Mixed hyperlipidemia: Secondary | ICD-10-CM | POA: Diagnosis not present

## 2022-01-06 DIAGNOSIS — E1169 Type 2 diabetes mellitus with other specified complication: Secondary | ICD-10-CM | POA: Diagnosis not present

## 2022-01-06 DIAGNOSIS — E1122 Type 2 diabetes mellitus with diabetic chronic kidney disease: Secondary | ICD-10-CM | POA: Diagnosis not present

## 2022-01-06 DIAGNOSIS — H9193 Unspecified hearing loss, bilateral: Secondary | ICD-10-CM | POA: Diagnosis not present

## 2022-01-06 DIAGNOSIS — M1712 Unilateral primary osteoarthritis, left knee: Secondary | ICD-10-CM | POA: Diagnosis not present

## 2022-01-06 DIAGNOSIS — N1831 Chronic kidney disease, stage 3a: Secondary | ICD-10-CM | POA: Diagnosis not present

## 2022-01-06 DIAGNOSIS — Z7982 Long term (current) use of aspirin: Secondary | ICD-10-CM | POA: Diagnosis not present

## 2022-01-06 DIAGNOSIS — K219 Gastro-esophageal reflux disease without esophagitis: Secondary | ICD-10-CM | POA: Diagnosis not present

## 2022-01-06 DIAGNOSIS — I129 Hypertensive chronic kidney disease with stage 1 through stage 4 chronic kidney disease, or unspecified chronic kidney disease: Secondary | ICD-10-CM | POA: Diagnosis not present

## 2022-01-06 DIAGNOSIS — Z89422 Acquired absence of other left toe(s): Secondary | ICD-10-CM | POA: Diagnosis not present

## 2022-01-06 DIAGNOSIS — E1142 Type 2 diabetes mellitus with diabetic polyneuropathy: Secondary | ICD-10-CM | POA: Diagnosis not present

## 2022-01-06 DIAGNOSIS — D631 Anemia in chronic kidney disease: Secondary | ICD-10-CM | POA: Diagnosis not present

## 2022-01-06 DIAGNOSIS — Z9181 History of falling: Secondary | ICD-10-CM | POA: Diagnosis not present

## 2022-01-06 DIAGNOSIS — S72111D Displaced fracture of greater trochanter of right femur, subsequent encounter for closed fracture with routine healing: Secondary | ICD-10-CM | POA: Diagnosis not present

## 2022-01-06 DIAGNOSIS — E1151 Type 2 diabetes mellitus with diabetic peripheral angiopathy without gangrene: Secondary | ICD-10-CM | POA: Diagnosis not present

## 2022-01-06 DIAGNOSIS — Z7984 Long term (current) use of oral hypoglycemic drugs: Secondary | ICD-10-CM | POA: Diagnosis not present

## 2022-01-12 DIAGNOSIS — K219 Gastro-esophageal reflux disease without esophagitis: Secondary | ICD-10-CM | POA: Diagnosis not present

## 2022-01-12 DIAGNOSIS — E1122 Type 2 diabetes mellitus with diabetic chronic kidney disease: Secondary | ICD-10-CM | POA: Diagnosis not present

## 2022-01-12 DIAGNOSIS — E1151 Type 2 diabetes mellitus with diabetic peripheral angiopathy without gangrene: Secondary | ICD-10-CM | POA: Diagnosis not present

## 2022-01-12 DIAGNOSIS — Z7982 Long term (current) use of aspirin: Secondary | ICD-10-CM | POA: Diagnosis not present

## 2022-01-12 DIAGNOSIS — M48061 Spinal stenosis, lumbar region without neurogenic claudication: Secondary | ICD-10-CM | POA: Diagnosis not present

## 2022-01-12 DIAGNOSIS — N1831 Chronic kidney disease, stage 3a: Secondary | ICD-10-CM | POA: Diagnosis not present

## 2022-01-12 DIAGNOSIS — S72111D Displaced fracture of greater trochanter of right femur, subsequent encounter for closed fracture with routine healing: Secondary | ICD-10-CM | POA: Diagnosis not present

## 2022-01-12 DIAGNOSIS — Z89422 Acquired absence of other left toe(s): Secondary | ICD-10-CM | POA: Diagnosis not present

## 2022-01-12 DIAGNOSIS — Z7984 Long term (current) use of oral hypoglycemic drugs: Secondary | ICD-10-CM | POA: Diagnosis not present

## 2022-01-12 DIAGNOSIS — E1169 Type 2 diabetes mellitus with other specified complication: Secondary | ICD-10-CM | POA: Diagnosis not present

## 2022-01-12 DIAGNOSIS — I129 Hypertensive chronic kidney disease with stage 1 through stage 4 chronic kidney disease, or unspecified chronic kidney disease: Secondary | ICD-10-CM | POA: Diagnosis not present

## 2022-01-12 DIAGNOSIS — E782 Mixed hyperlipidemia: Secondary | ICD-10-CM | POA: Diagnosis not present

## 2022-01-12 DIAGNOSIS — H9193 Unspecified hearing loss, bilateral: Secondary | ICD-10-CM | POA: Diagnosis not present

## 2022-01-12 DIAGNOSIS — M160 Bilateral primary osteoarthritis of hip: Secondary | ICD-10-CM | POA: Diagnosis not present

## 2022-01-12 DIAGNOSIS — Z9181 History of falling: Secondary | ICD-10-CM | POA: Diagnosis not present

## 2022-01-12 DIAGNOSIS — M1712 Unilateral primary osteoarthritis, left knee: Secondary | ICD-10-CM | POA: Diagnosis not present

## 2022-01-12 DIAGNOSIS — D631 Anemia in chronic kidney disease: Secondary | ICD-10-CM | POA: Diagnosis not present

## 2022-01-12 DIAGNOSIS — E1142 Type 2 diabetes mellitus with diabetic polyneuropathy: Secondary | ICD-10-CM | POA: Diagnosis not present

## 2022-01-24 DIAGNOSIS — L89323 Pressure ulcer of left buttock, stage 3: Secondary | ICD-10-CM | POA: Diagnosis not present

## 2022-01-24 DIAGNOSIS — L89313 Pressure ulcer of right buttock, stage 3: Secondary | ICD-10-CM | POA: Diagnosis not present

## 2022-01-26 DIAGNOSIS — Z135 Encounter for screening for eye and ear disorders: Secondary | ICD-10-CM | POA: Diagnosis not present

## 2022-01-26 DIAGNOSIS — H5203 Hypermetropia, bilateral: Secondary | ICD-10-CM | POA: Diagnosis not present

## 2022-01-26 DIAGNOSIS — H52223 Regular astigmatism, bilateral: Secondary | ICD-10-CM | POA: Diagnosis not present

## 2022-01-26 DIAGNOSIS — H524 Presbyopia: Secondary | ICD-10-CM | POA: Diagnosis not present

## 2022-01-26 DIAGNOSIS — E119 Type 2 diabetes mellitus without complications: Secondary | ICD-10-CM | POA: Diagnosis not present

## 2022-01-30 ENCOUNTER — Encounter (HOSPITAL_COMMUNITY): Payer: Self-pay | Admitting: Internal Medicine

## 2022-01-30 ENCOUNTER — Emergency Department (HOSPITAL_COMMUNITY): Payer: Medicare HMO

## 2022-01-30 ENCOUNTER — Inpatient Hospital Stay (HOSPITAL_COMMUNITY)
Admission: EM | Admit: 2022-01-30 | Discharge: 2022-02-08 | DRG: 481 | Disposition: A | Discharge status: Skilled Nursing Facility | Payer: Medicare HMO | Attending: Internal Medicine | Admitting: Internal Medicine

## 2022-01-30 ENCOUNTER — Other Ambulatory Visit: Payer: Self-pay

## 2022-01-30 DIAGNOSIS — Z6820 Body mass index (BMI) 20.0-20.9, adult: Secondary | ICD-10-CM

## 2022-01-30 DIAGNOSIS — Z66 Do not resuscitate: Secondary | ICD-10-CM | POA: Diagnosis present

## 2022-01-30 DIAGNOSIS — E440 Moderate protein-calorie malnutrition: Secondary | ICD-10-CM | POA: Diagnosis not present

## 2022-01-30 DIAGNOSIS — Y9301 Activity, walking, marching and hiking: Secondary | ICD-10-CM | POA: Diagnosis not present

## 2022-01-30 DIAGNOSIS — Z8619 Personal history of other infectious and parasitic diseases: Secondary | ICD-10-CM | POA: Diagnosis not present

## 2022-01-30 DIAGNOSIS — N189 Chronic kidney disease, unspecified: Secondary | ICD-10-CM | POA: Diagnosis not present

## 2022-01-30 DIAGNOSIS — J9811 Atelectasis: Secondary | ICD-10-CM | POA: Diagnosis not present

## 2022-01-30 DIAGNOSIS — D72829 Elevated white blood cell count, unspecified: Secondary | ICD-10-CM | POA: Diagnosis not present

## 2022-01-30 DIAGNOSIS — W1830XA Fall on same level, unspecified, initial encounter: Secondary | ICD-10-CM | POA: Diagnosis present

## 2022-01-30 DIAGNOSIS — S72141A Displaced intertrochanteric fracture of right femur, initial encounter for closed fracture: Principal | ICD-10-CM | POA: Diagnosis present

## 2022-01-30 DIAGNOSIS — S72001D Fracture of unspecified part of neck of right femur, subsequent encounter for closed fracture with routine healing: Secondary | ICD-10-CM | POA: Diagnosis not present

## 2022-01-30 DIAGNOSIS — E1122 Type 2 diabetes mellitus with diabetic chronic kidney disease: Secondary | ICD-10-CM | POA: Diagnosis present

## 2022-01-30 DIAGNOSIS — Z888 Allergy status to other drugs, medicaments and biological substances status: Secondary | ICD-10-CM

## 2022-01-30 DIAGNOSIS — Y92009 Unspecified place in unspecified non-institutional (private) residence as the place of occurrence of the external cause: Secondary | ICD-10-CM

## 2022-01-30 DIAGNOSIS — M479 Spondylosis, unspecified: Secondary | ICD-10-CM | POA: Diagnosis present

## 2022-01-30 DIAGNOSIS — R1310 Dysphagia, unspecified: Secondary | ICD-10-CM | POA: Diagnosis not present

## 2022-01-30 DIAGNOSIS — W010XXA Fall on same level from slipping, tripping and stumbling without subsequent striking against object, initial encounter: Secondary | ICD-10-CM | POA: Diagnosis not present

## 2022-01-30 DIAGNOSIS — L89312 Pressure ulcer of right buttock, stage 2: Secondary | ICD-10-CM | POA: Diagnosis not present

## 2022-01-30 DIAGNOSIS — L8990 Pressure ulcer of unspecified site, unspecified stage: Secondary | ICD-10-CM | POA: Insufficient documentation

## 2022-01-30 DIAGNOSIS — D649 Anemia, unspecified: Secondary | ICD-10-CM

## 2022-01-30 DIAGNOSIS — Z743 Need for continuous supervision: Secondary | ICD-10-CM | POA: Diagnosis not present

## 2022-01-30 DIAGNOSIS — S299XXA Unspecified injury of thorax, initial encounter: Secondary | ICD-10-CM | POA: Diagnosis not present

## 2022-01-30 DIAGNOSIS — Z7984 Long term (current) use of oral hypoglycemic drugs: Secondary | ICD-10-CM

## 2022-01-30 DIAGNOSIS — D631 Anemia in chronic kidney disease: Secondary | ICD-10-CM | POA: Diagnosis present

## 2022-01-30 DIAGNOSIS — E119 Type 2 diabetes mellitus without complications: Secondary | ICD-10-CM

## 2022-01-30 DIAGNOSIS — M79604 Pain in right leg: Secondary | ICD-10-CM | POA: Diagnosis not present

## 2022-01-30 DIAGNOSIS — Z794 Long term (current) use of insulin: Secondary | ICD-10-CM | POA: Diagnosis not present

## 2022-01-30 DIAGNOSIS — I129 Hypertensive chronic kidney disease with stage 1 through stage 4 chronic kidney disease, or unspecified chronic kidney disease: Secondary | ICD-10-CM | POA: Diagnosis not present

## 2022-01-30 DIAGNOSIS — M7989 Other specified soft tissue disorders: Secondary | ICD-10-CM | POA: Diagnosis not present

## 2022-01-30 DIAGNOSIS — E872 Acidosis, unspecified: Secondary | ICD-10-CM | POA: Diagnosis not present

## 2022-01-30 DIAGNOSIS — R918 Other nonspecific abnormal finding of lung field: Secondary | ICD-10-CM | POA: Diagnosis not present

## 2022-01-30 DIAGNOSIS — L89151 Pressure ulcer of sacral region, stage 1: Secondary | ICD-10-CM | POA: Diagnosis not present

## 2022-01-30 DIAGNOSIS — N179 Acute kidney failure, unspecified: Secondary | ICD-10-CM | POA: Diagnosis not present

## 2022-01-30 DIAGNOSIS — R1110 Vomiting, unspecified: Secondary | ICD-10-CM | POA: Diagnosis not present

## 2022-01-30 DIAGNOSIS — I10 Essential (primary) hypertension: Secondary | ICD-10-CM | POA: Diagnosis present

## 2022-01-30 DIAGNOSIS — M170 Bilateral primary osteoarthritis of knee: Secondary | ICD-10-CM | POA: Diagnosis present

## 2022-01-30 DIAGNOSIS — N1831 Chronic kidney disease, stage 3a: Secondary | ICD-10-CM | POA: Diagnosis present

## 2022-01-30 DIAGNOSIS — S72001A Fracture of unspecified part of neck of right femur, initial encounter for closed fracture: Secondary | ICD-10-CM | POA: Diagnosis not present

## 2022-01-30 DIAGNOSIS — W19XXXA Unspecified fall, initial encounter: Secondary | ICD-10-CM | POA: Diagnosis not present

## 2022-01-30 DIAGNOSIS — H9190 Unspecified hearing loss, unspecified ear: Secondary | ICD-10-CM | POA: Diagnosis not present

## 2022-01-30 DIAGNOSIS — E1165 Type 2 diabetes mellitus with hyperglycemia: Secondary | ICD-10-CM | POA: Diagnosis not present

## 2022-01-30 DIAGNOSIS — Z79899 Other long term (current) drug therapy: Secondary | ICD-10-CM

## 2022-01-30 DIAGNOSIS — K5900 Constipation, unspecified: Secondary | ICD-10-CM | POA: Diagnosis present

## 2022-01-30 DIAGNOSIS — Z87891 Personal history of nicotine dependence: Secondary | ICD-10-CM

## 2022-01-30 DIAGNOSIS — Z7982 Long term (current) use of aspirin: Secondary | ICD-10-CM

## 2022-01-30 DIAGNOSIS — E7800 Pure hypercholesterolemia, unspecified: Secondary | ICD-10-CM | POA: Diagnosis not present

## 2022-01-30 DIAGNOSIS — I959 Hypotension, unspecified: Secondary | ICD-10-CM | POA: Diagnosis not present

## 2022-01-30 DIAGNOSIS — R609 Edema, unspecified: Secondary | ICD-10-CM | POA: Diagnosis not present

## 2022-01-30 DIAGNOSIS — D62 Acute posthemorrhagic anemia: Secondary | ICD-10-CM | POA: Diagnosis not present

## 2022-01-30 DIAGNOSIS — E875 Hyperkalemia: Secondary | ICD-10-CM | POA: Diagnosis not present

## 2022-01-30 DIAGNOSIS — R059 Cough, unspecified: Secondary | ICD-10-CM | POA: Diagnosis not present

## 2022-01-30 LAB — COMPREHENSIVE METABOLIC PANEL
ALT: 12 U/L (ref 0–44)
AST: 14 U/L — ABNORMAL LOW (ref 15–41)
Albumin: 2.8 g/dL — ABNORMAL LOW (ref 3.5–5.0)
Alkaline Phosphatase: 43 U/L (ref 38–126)
Anion gap: 7 (ref 5–15)
BUN: 77 mg/dL — ABNORMAL HIGH (ref 8–23)
CO2: 19 mmol/L — ABNORMAL LOW (ref 22–32)
Calcium: 8.6 mg/dL — ABNORMAL LOW (ref 8.9–10.3)
Chloride: 112 mmol/L — ABNORMAL HIGH (ref 98–111)
Creatinine, Ser: 2.16 mg/dL — ABNORMAL HIGH (ref 0.61–1.24)
GFR, Estimated: 30 mL/min — ABNORMAL LOW (ref >60)
Glucose, Bld: 204 mg/dL — ABNORMAL HIGH (ref 70–99)
Potassium: 4.1 mmol/L (ref 3.5–5.1)
Sodium: 138 mmol/L (ref 135–145)
Total Bilirubin: 0.4 mg/dL (ref 0.3–1.2)
Total Protein: 6.3 g/dL — ABNORMAL LOW (ref 6.5–8.1)

## 2022-01-30 LAB — CBC WITH DIFFERENTIAL/PLATELET
Abs Immature Granulocytes: 0.07 10*3/uL (ref 0.00–0.07)
Basophils Absolute: 0 10*3/uL (ref 0.0–0.1)
Basophils Relative: 0 %
Eosinophils Absolute: 0 10*3/uL (ref 0.0–0.5)
Eosinophils Relative: 0 %
HCT: 23.1 % — ABNORMAL LOW (ref 39.0–52.0)
Hemoglobin: 7.2 g/dL — ABNORMAL LOW (ref 13.0–17.0)
Immature Granulocytes: 1 %
Lymphocytes Relative: 5 %
Lymphs Abs: 0.6 10*3/uL — ABNORMAL LOW (ref 0.7–4.0)
MCH: 29.4 pg (ref 26.0–34.0)
MCHC: 31.2 g/dL (ref 30.0–36.0)
MCV: 94.3 fL (ref 80.0–100.0)
Monocytes Absolute: 1.3 10*3/uL — ABNORMAL HIGH (ref 0.1–1.0)
Monocytes Relative: 10 %
Neutro Abs: 11.4 10*3/uL — ABNORMAL HIGH (ref 1.7–7.7)
Neutrophils Relative %: 84 %
Platelets: 248 10*3/uL (ref 150–400)
RBC: 2.45 MIL/uL — ABNORMAL LOW (ref 4.22–5.81)
RDW: 15.2 % (ref 11.5–15.5)
WBC: 13.5 10*3/uL — ABNORMAL HIGH (ref 4.0–10.5)
nRBC: 0 % (ref 0.0–0.2)

## 2022-01-30 LAB — IRON AND TIBC
Iron: 10 ug/dL — ABNORMAL LOW (ref 45–182)
Saturation Ratios: 5 % — ABNORMAL LOW (ref 17.9–39.5)
TIBC: 217 ug/dL — ABNORMAL LOW (ref 250–450)
UIBC: 207 ug/dL

## 2022-01-30 LAB — MAGNESIUM: Magnesium: 2.1 mg/dL (ref 1.7–2.4)

## 2022-01-30 LAB — SURGICAL PCR SCREEN
MRSA, PCR: NEGATIVE
Staphylococcus aureus: NEGATIVE

## 2022-01-30 LAB — GLUCOSE, CAPILLARY
Glucose-Capillary: 260 mg/dL — ABNORMAL HIGH (ref 70–99)
Glucose-Capillary: 219 mg/dL — ABNORMAL HIGH (ref 70–99)

## 2022-01-30 LAB — HEMOGLOBIN A1C
Hgb A1c MFr Bld: 6.7 % — ABNORMAL HIGH (ref 4.8–5.6)
Mean Plasma Glucose: 145.59 mg/dL

## 2022-01-30 MED ORDER — MORPHINE SULFATE (PF) 2 MG/ML IV SOLN: 0.5000 mg | INTRAVENOUS | Status: DC | PRN

## 2022-01-30 MED ORDER — ASPIRIN 81 MG PO CHEW: 81.0000 mg | CHEWABLE_TABLET | Freq: Every day | ORAL | Status: DC

## 2022-01-30 MED ORDER — AMLODIPINE BESYLATE 5 MG PO TABS
5.0000 mg | ORAL_TABLET | Freq: Every day | ORAL | Status: DC
  Administered 2022-01-31 – 2022-02-08 (×9): 5 mg via ORAL
  Filled 2022-01-30 (×9): qty 1

## 2022-01-30 MED ORDER — HYDROCODONE-ACETAMINOPHEN 5-325 MG PO TABS
1.0000 | ORAL_TABLET | Freq: Four times a day (QID) | ORAL | Status: DC | PRN
  Administered 2022-01-30: 1 via ORAL
  Filled 2022-01-30: qty 1

## 2022-01-30 MED ORDER — FENTANYL CITRATE PF 50 MCG/ML IJ SOSY: 50.0000 ug | PREFILLED_SYRINGE | INTRAMUSCULAR | Status: DC | PRN

## 2022-01-30 MED ORDER — MUPIROCIN 2 % EX OINT
1.0000 | TOPICAL_OINTMENT | Freq: Two times a day (BID) | CUTANEOUS | Status: DC
  Administered 2022-01-30 – 2022-01-31 (×2): 1 via NASAL
  Filled 2022-01-30 (×2): qty 22

## 2022-01-30 MED ORDER — SIMVASTATIN 20 MG PO TABS
20.0000 mg | ORAL_TABLET | Freq: Every evening | ORAL | Status: DC
  Administered 2022-01-30 – 2022-02-07 (×8): 20 mg via ORAL
  Filled 2022-01-30 (×8): qty 1

## 2022-01-30 MED ORDER — SPIRONOLACTONE 12.5 MG HALF TABLET
12.5000 mg | ORAL_TABLET | Freq: Every day | ORAL | Status: DC
  Administered 2022-01-31 – 2022-02-02 (×3): 12.5 mg via ORAL
  Filled 2022-01-30 (×4): qty 1

## 2022-01-30 MED ORDER — INSULIN ASPART 100 UNIT/ML IJ SOLN
0.0000 [IU] | Freq: Three times a day (TID) | INTRAMUSCULAR | Status: DC
  Administered 2022-01-30: 5 [IU] via SUBCUTANEOUS
  Administered 2022-01-31: 2 [IU] via SUBCUTANEOUS
  Administered 2022-01-31: 5 [IU] via SUBCUTANEOUS
  Administered 2022-01-31: 1 [IU] via SUBCUTANEOUS
  Administered 2022-02-01: 7 [IU] via SUBCUTANEOUS
  Administered 2022-02-01: 5 [IU] via SUBCUTANEOUS

## 2022-01-30 MED ORDER — FERROUS SULFATE 325 (65 FE) MG PO TABS
325.0000 mg | ORAL_TABLET | Freq: Every morning | ORAL | Status: DC
  Administered 2022-01-31 – 2022-02-08 (×8): 325 mg via ORAL
  Filled 2022-01-30 (×8): qty 1

## 2022-01-30 NOTE — H&P (Signed)
History and Physical    Patient: Jason Mcpherson XBM:[id number] DOB: 05/31/1940 DOA: 01/30/2022 DOS: the patient was seen and examined on 01/30/2022 PCP: Doreatha Lew, MD  Patient coming from: Home  Chief Complaint:  Chief Complaint  Patient presents with   Fall   Leg Injury   HPI: Jason Mcpherson is a 81 y.o. male with medical history significant of HTN, CKD 3a, DM2, HLD. Presenting with right hip pain after fall. History per family at bedside. He was walking through the house 2 days ago when he had an unwitnessed fall. He was walking without his walker at the time. He didn't pass out. Family was able to help him up shortly after his fall. He didn't complain of any chest pain or palpitations. Later that evening, he was able to stand on his own without any problems. However, yesterday, he began having pain and difficulty mobilizing with his walker. This morning, he was unable to walk d/t pain. His family became concerned and called for EMS. They deny any other aggravating or alleviating factors.   Review of Systems: As mentioned in the history of present illness. All other systems reviewed and are negative. Past Medical History:  Diagnosis Date   Anemia    Arthritis    KNEES & BACK   Cellulitis 08/2013   RT LEG   Chronic kidney disease 08/2013   ACUTE RENAL    Dehydration    Diabetes mellitus without complication ([place])    Hyperkalemia 08/2013   Hyperlipemia    Hypertension    Past Surgical History:  Procedure Laterality Date   ANTERIOR CERVICAL DECOMP/DISCECTOMY FUSION N/A 10/17/2012   Procedure: ANTERIOR CERVICAL DECOMPRESSION/DISCECTOMY FUSION 2 LEVELS;  Surgeon: Charlie Pitter, MD;  Location: [place] [facility];  Service: Neurosurgery;  Laterality: N/A;  Cervical three-four, four-five anterior cervical decompression fusion with allograft plating   BACK SURGERY     10 years ago   [place]     Social History:  reports that he quit smoking about 24 years ago. He has  never used smokeless tobacco. He reports that he does not drink alcohol and does not use drugs.  Allergies  Allergen Reactions   Amlodipine Swelling    ANKLE EDEMA    Family History  Problem Relation Age of Onset   Cancer - Colon Mother    Cancer - Prostate Father     Prior to Admission medications   Medication Sig Start Date End Date Taking? Authorizing Provider  acetaminophen (TYLENOL) 500 MG tablet Take 1,000 mg by mouth every 6 (six) hours as needed for mild pain.   Yes [provider]  amLODipine (NORVASC) 5 MG tablet TAKE 1 TABLET (5 MG TOTAL) BY MOUTH DAILY. 07/05/20  Yes Elayne Snare, MD  aspirin 81 MG chewable tablet Chew 81 mg by mouth at bedtime.   Yes [provider]  FARXIGA 5 MG TABS tablet Take 5 mg by mouth daily. 01/15/22  Yes [provider]  ferrous sulfate 325 (65 FE) MG tablet Take 325 mg by mouth every morning. 12/28/21  Yes [provider]  glimepiride (AMARYL) 1 MG tablet Take 1.5 mg by mouth every morning. 12/11/21  Yes [provider]  simvastatin (ZOCOR) 20 MG tablet TAKE 1 TABLET BY MOUTH EVERY DAY IN THE EVENING Patient taking differently: Take 20 mg by mouth every evening. 03/30/20  Yes Elayne Snare, MD  spironolactone (ALDACTONE) 25 MG tablet Take 12.5 mg by mouth daily. 01/23/22  Yes [provider]  torsemide (DEMADEX) 10 MG tablet Take 10 mg by mouth daily as needed for edema. 10/19/21  Yes [provider]  Vitamin D, Ergocalciferol, (DRISDOL) 1.25 MG (50000 UNIT) CAPS capsule Take 50,000 Units by mouth once a week. Saturday 01/05/22  Yes [provider]  glucose blood (ONETOUCH ULTRA) test strip USE AS DIRECTED TO TEST TWICE DAILY 05/13/20   Elayne Snare, MD  Lancets (ONETOUCH DELICA PLUS [id number]) [place] 2X DAILY. 06/06/20   Elayne Snare, MD  [facility] DELICA LANCETS FINE MISC USED TO CHECK SUGAR 2X DAILY. 12/28/16   Elayne Snare, MD    Physical Exam: Vitals:   01/30/22 1130  01/30/22 1145 01/30/22 1200 01/30/22 1230  BP: 129/64 120/65 113/62 130/67  Pulse: 74 77 77 80  Resp: 11 10 10 10   Temp:      TempSrc:      SpO2: 99% 100% 98% 98%   General: 81 y.o. male resting in bed in NAD Eyes: PERRL, normal sclera ENMT: Nares patent w/o discharge, orophaynx clear, dentition normal, ears w/o discharge/lesions/ulcers Neck: Supple, trachea midline Cardiovascular: RRR, +S1, S2, no m/g/r, equal pulses throughout Respiratory: CTABL, no w/r/r, normal WOB GI: BS+, NDNT, no masses noted, no organomegaly noted MSK: No c/c; right thigh/hip swelling, limited right hip ROM d/t pain Neuro: A&O x 3, other than HoH, no focal deficits Psyc: Appropriate interaction and affect, calm/cooperative  Data Reviewed:  Results for orders placed or performed during the hospital encounter of 01/30/22 (from the past 24 hour(s))  CBC with Differential     Status: Abnormal   Collection Time: 01/30/22 10:54 AM  Result Value Ref Range   WBC 13.5 (H) 4.0 - 10.5 K/uL   RBC 2.45 (L) 4.22 - 5.81 MIL/uL   Hemoglobin 7.2 (L) 13.0 - 17.0 g/dL   HCT 23.1 (L) 39.0 - 52.0 %   MCV 94.3 80.0 - 100.0 fL   MCH 29.4 26.0 - 34.0 pg   MCHC 31.2 30.0 - 36.0 g/dL   RDW 15.2 11.5 - 15.5 %   Platelets 248 150 - 400 K/uL   nRBC 0.0 0.0 - 0.2 %   Neutrophils Relative % 84 %   Neutro Abs 11.4 (H) 1.7 - 7.7 K/uL   Lymphocytes Relative 5 %   Lymphs Abs 0.6 (L) 0.7 - 4.0 K/uL   Monocytes Relative 10 %   Monocytes Absolute 1.3 (H) 0.1 - 1.0 K/uL   Eosinophils Relative 0 %   Eosinophils Absolute 0.0 0.0 - 0.5 K/uL   Basophils Relative 0 %   Basophils Absolute 0.0 0.0 - 0.1 K/uL   Immature Granulocytes 1 %   Abs Immature Granulocytes 0.07 0.00 - 0.07 K/uL   CXR: Increased markings in parahilar regions and lower lung fields may suggest crowding of bronchovascular structures due to poor inspiration or interstitial pneumonia.  XR Right Hip XR Left Hip XR Right Femur 1. Acute, comminuted fracture of the  right proximal femoral intertrochanteric region and likely the adjacent distal right femoral neck. 2. Moderate left and mild right femoroacetabular osteoarthritis. 3. Severe tricompartmental osteoarthritis of the right knee.  Assessment and Plan: Right Hip Fracture Fall     - admit to inpt, med-surg     - Ortho consulted (Dr. Cyndie Chime); appreciate assistance     - NPOpMN     - pain control     - PT/OT after procedure  HLD     - continue home regimen  DM2     -  A1c, SSI, glucose checks, DM diet  HTN     - resume home regimen  CKD 3a     - labs pending  Normocytic anemia     - check iron studies  Advance Care Planning:   Code Status: DNR (confirmed w/ wife)  Consults: Orthopedics (Dr. Corine Shelter)  Family Communication: w/ wife at bedside  Severity of Illness: The appropriate patient status for this patient is INPATIENT. Inpatient status is judged to be reasonable and necessary in order to provide the required intensity of service to ensure the patient's safety. The patient's presenting symptoms, physical exam findings, and initial radiographic and laboratory data in the context of their chronic comorbidities is felt to place them at high risk for further clinical deterioration. Furthermore, it is not anticipated that the patient will be medically stable for discharge from the hospital within 2 midnights of admission.   * I certify that at the point of admission it is my clinical judgment that the patient will require inpatient hospital care spanning beyond 2 midnights from the point of admission due to high intensity of service, high risk for further deterioration and high frequency of surveillance required.*  Author: Jonnie Finner, DO 01/30/2022 1:10 PM  For on call review [email or web address].

## 2022-01-30 NOTE — Consult Note (Addendum)
ORTHOPAEDIC CONSULTATION  REQUESTING PHYSICIAN: Jonnie Finner, DO  PCP:  Patrecia Pour, Christean Grief, MD  Chief Complaint: Right hip pain  HPI: Jason Mcpherson is a 81 y.o. male who complains of right hip pain after a fall on 01/28/2022.  He had an unwitnessed fall walking with a walker, did not pass out.  His family helped him up and he was able to walk without issues and stand after that.  The next day began having pain and difficulty mobilizing.  This progressed to having the inability to walk and weight-bear secondary to pain today.  He does have a past medical history of hypertension, CKD type III, type 2 diabetes mellitus, HLD.  He is companied by his wife and son today.  They report that he progressively was getting worse with his right hip over the past 2 days at the time of the fall.  They unsure exactly how he fell.  He typically uses a rolling walker and a normal walker at home.  He has a ramp into his house as well as a sit up recliner and hospital bed.  Bedrooms on the first floor, no steps to get in the house.  Patient able to respond to questions competently, is hard of hearing with better hearing on the left ear.  Past Medical History:  Diagnosis Date   Anemia    Arthritis    KNEES & BACK   Cellulitis 08/2013   RT LEG   Chronic kidney disease 08/2013   ACUTE RENAL    Dehydration    Diabetes mellitus without complication ([place])    Hyperkalemia 08/2013   Hyperlipemia    Hypertension    Past Surgical History:  Procedure Laterality Date   ANTERIOR CERVICAL DECOMP/DISCECTOMY FUSION N/A 10/17/2012   Procedure: ANTERIOR CERVICAL DECOMPRESSION/DISCECTOMY FUSION 2 LEVELS;  Surgeon: Charlie Pitter, MD;  Location: [facility];  Service: Neurosurgery;  Laterality: N/A;  Cervical three-four, four-five anterior cervical decompression fusion with allograft plating   BACK SURGERY     10 years ago   [place] History   Socioeconomic History   Marital status:  Married    Spouse name: Not on file   Number of children: Not on file   Years of education: Not on file   Highest education level: Not on file  Occupational History   Not on file  Tobacco Use   Smoking status: Former    Types: Cigarettes    Quit date: 10/12/1997    Years since quitting: 24.3   Smokeless tobacco: Never  Substance and Sexual Activity   Alcohol use: No   Drug use: No   Sexual activity: Not on file  Other Topics Concern   Not on file  Social History Narrative   Not on file   Social Determinants of Health   Financial Resource Strain: Not on file  Food Insecurity: Not on file  Transportation Needs: Not on file  Physical Activity: Not on file  Stress: Not on file  Social Connections: Not on file   Family History  Problem Relation Age of Onset   Cancer - Colon Mother    Cancer - Prostate Father    Allergies  Allergen Reactions   Amlodipine Swelling    ANKLE EDEMA   Prior to Admission medications   Medication Sig Start Date End Date Taking? Authorizing Provider  acetaminophen (TYLENOL) 500 MG tablet Take 1,000 mg by mouth every 6 (six) hours as needed for  mild pain.   Yes [provider]  amLODipine (NORVASC) 5 MG tablet TAKE 1 TABLET (5 MG TOTAL) BY MOUTH DAILY. 07/05/20  Yes Elayne Snare, MD  aspirin 81 MG chewable tablet Chew 81 mg by mouth at bedtime.   Yes [provider]  FARXIGA 5 MG TABS tablet Take 5 mg by mouth daily. 01/15/22  Yes [provider]  ferrous sulfate 325 (65 FE) MG tablet Take 325 mg by mouth every morning. 12/28/21  Yes [provider]  glimepiride (AMARYL) 1 MG tablet Take 1.5 mg by mouth every morning. 12/11/21  Yes [provider]  simvastatin (ZOCOR) 20 MG tablet TAKE 1 TABLET BY MOUTH EVERY DAY IN THE EVENING Patient taking differently: Take 20 mg by mouth every evening. 03/30/20  Yes Elayne Snare, MD  spironolactone (ALDACTONE) 25 MG tablet Take 12.5 mg by mouth daily. 01/23/22  Yes [provider]  torsemide (DEMADEX) 10 MG tablet Take 10 mg by mouth daily as needed for edema. 10/19/21  Yes [provider]  Vitamin D, Ergocalciferol, (DRISDOL) 1.25 MG (50000 UNIT) CAPS capsule Take 50,000 Units by mouth once a week. Saturday 01/05/22  Yes [provider]  glucose blood (ONETOUCH ULTRA) test strip USE AS DIRECTED TO TEST TWICE DAILY 05/13/20   Elayne Snare, MD  Lancets (ONETOUCH DELICA PLUS [id number]) [place] 2X DAILY. 06/06/20   Elayne Snare, MD  [facility] DELICA LANCETS FINE MISC USED TO CHECK SUGAR 2X DAILY. 12/28/16   Elayne Snare, MD   DG Hip Unilat W or Wo Pelvis 2-3 Views Right  Result Date: 01/30/2022 CLINICAL DATA:  Fall 2 days ago. EXAM: DG HIP (WITH OR WITHOUT PELVIS) 2-3V RIGHT; RIGHT FEMUR 2 VIEWS; DG HIP (WITH OR WITHOUT PELVIS) 2-3V LEFT COMPARISON:  None Available. FINDINGS: Right hip: There is diffuse decreased bone mineralization. There is a comminuted fracture involving the superior through inferior aspect of the right proximal femoral intertrochanteric region and likely the adjacent distal right femoral neck. There is up to approximately 2 cm medial displacement of the right lesser trochanter. The right femoroacetabular joint remains normally located. Moderate left and mild right femoroacetabular joint space narrowing. -- Left hip: There is moderate chronic appearing heterotopic bone formation superior to the left greater trochanter and inferior to the right femoral neck. No definite left hip fracture is visualized. Lower lumbar spine bilateral transpedicular rod and screw fusion hardware. -- Right femur: There is diffuse decreased bone mineralization. Severe patellofemoral joint space narrowing and peripheral osteophytosis. Severe lateral greater than medial compartment of the knee joint space narrowing and peripheral osteophytosis. No acute fracture is seen within the distal right femur. Small knee joint effusion. Moderate right thigh  vascular calcifications. IMPRESSION: 1. Acute, comminuted fracture of the right proximal femoral intertrochanteric region and likely the adjacent distal right femoral neck. 2. Moderate left and mild right femoroacetabular osteoarthritis. 3. Severe tricompartmental osteoarthritis of the right knee. Electronically Signed   By: Yvonne Kendall M.D.   On: 01/30/2022 10:57   DG Femur Min 2 Views Right  Result Date: 01/30/2022 CLINICAL DATA:  Fall 2 days ago. EXAM: DG HIP (WITH OR WITHOUT PELVIS) 2-3V RIGHT; RIGHT FEMUR 2 VIEWS; DG HIP (WITH OR WITHOUT PELVIS) 2-3V LEFT COMPARISON:  None Available. FINDINGS: Right hip: There is diffuse decreased bone mineralization. There is a comminuted fracture involving the superior through inferior aspect of the right proximal femoral intertrochanteric region and likely the adjacent distal right femoral neck. There is up  to approximately 2 cm medial displacement of the right lesser trochanter. The right femoroacetabular joint remains normally located. Moderate left and mild right femoroacetabular joint space narrowing. -- Left hip: There is moderate chronic appearing heterotopic bone formation superior to the left greater trochanter and inferior to the right femoral neck. No definite left hip fracture is visualized. Lower lumbar spine bilateral transpedicular rod and screw fusion hardware. -- Right femur: There is diffuse decreased bone mineralization. Severe patellofemoral joint space narrowing and peripheral osteophytosis. Severe lateral greater than medial compartment of the knee joint space narrowing and peripheral osteophytosis. No acute fracture is seen within the distal right femur. Small knee joint effusion. Moderate right thigh vascular calcifications. IMPRESSION: 1. Acute, comminuted fracture of the right proximal femoral intertrochanteric region and likely the adjacent distal right femoral neck. 2. Moderate left and mild right femoroacetabular osteoarthritis. 3. Severe  tricompartmental osteoarthritis of the right knee. Electronically Signed   By: Yvonne Kendall M.D.   On: 01/30/2022 10:57   DG Hip Unilat W or Wo Pelvis 2-3 Views Left  Result Date: 01/30/2022 CLINICAL DATA:  Fall 2 days ago. EXAM: DG HIP (WITH OR WITHOUT PELVIS) 2-3V RIGHT; RIGHT FEMUR 2 VIEWS; DG HIP (WITH OR WITHOUT PELVIS) 2-3V LEFT COMPARISON:  None Available. FINDINGS: Right hip: There is diffuse decreased bone mineralization. There is a comminuted fracture involving the superior through inferior aspect of the right proximal femoral intertrochanteric region and likely the adjacent distal right femoral neck. There is up to approximately 2 cm medial displacement of the right lesser trochanter. The right femoroacetabular joint remains normally located. Moderate left and mild right femoroacetabular joint space narrowing. -- Left hip: There is moderate chronic appearing heterotopic bone formation superior to the left greater trochanter and inferior to the right femoral neck. No definite left hip fracture is visualized. Lower lumbar spine bilateral transpedicular rod and screw fusion hardware. -- Right femur: There is diffuse decreased bone mineralization. Severe patellofemoral joint space narrowing and peripheral osteophytosis. Severe lateral greater than medial compartment of the knee joint space narrowing and peripheral osteophytosis. No acute fracture is seen within the distal right femur. Small knee joint effusion. Moderate right thigh vascular calcifications. IMPRESSION: 1. Acute, comminuted fracture of the right proximal femoral intertrochanteric region and likely the adjacent distal right femoral neck. 2. Moderate left and mild right femoroacetabular osteoarthritis. 3. Severe tricompartmental osteoarthritis of the right knee. Electronically Signed   By: Yvonne Kendall M.D.   On: 01/30/2022 10:57   DG Chest 1 View  Result Date: 01/30/2022 CLINICAL DATA:  Trauma, fall EXAM: CHEST  1 VIEW COMPARISON:   08/10/2021 FINDINGS: Cardiac size is within normal limits. There is poor inspiration. Increased interstitial markings are seen in parahilar regions and lower lung fields. There is no focal consolidation. There is no pleural effusion or pneumothorax. Degenerative changes with possible chronic tear of rotator cuff is seen in both shoulders. IMPRESSION: Increased markings in parahilar regions and lower lung fields may suggest crowding of bronchovascular structures due to poor inspiration or interstitial pneumonia. Electronically Signed   By: Elmer Picker M.D.   On: 01/30/2022 10:55    Positive ROS: All other systems have been reviewed and were otherwise negative with the exception of those mentioned in the HPI and as above.  Physical Exam: General: Alert, no acute distress Cardiovascular: No pedal edema Respiratory: No cyanosis, no use of accessory musculature GI: No organomegaly, abdomen is soft and non-tender Skin: No lesions in the area of chief complaint Neurologic:  Sensation intact distally Psychiatric: Patient is competent for consent with normal mood and affect Lymphatic: No axillary or cervical lymphadenopathy  MUSCULOSKELETAL:   Healed blanchable thin tissue at the sacrum.  No frank ulcers.  No tenderness palpation of the left hip, left knee, left foot.  No tenderness palpation of the bilateral upper extremities.  Bruising noted to the bilateral hands. Right hip : Small bruise noted at the right hip.  Hip is resting in externally rotated position.  Mild pain at the knee, moderate pain at the mid thigh and hip.  Able to move the foot and ankle and digits.  DP pulse 2+.  Sensation intact distally.  Assessment: Right hip intertrochanteric fracture with extension to the femoral neck.  Right knee osteoarthritis  Plan: Plan for surgical fixation likely IMN.  Plan for surgical fixation by Dr. Lyla Glassing tomorrow.  N.p.o. at midnight.  Admit to medicine.   Risk and benefits were  discussed with the patient and his wife and son including risk of infection, bleeding, injury to nerves and vessels, DVT, failure of hardware, risk of anesthesia.  They elected to proceed.     Faythe Casa, PA    01/30/2022 2:31 PM  I agree with the above findings and recommended plan as outlined above by PA Mcclung.  Dr. Lyla Glassing to assume care tomorrow. Nicholes Stairs

## 2022-01-30 NOTE — Plan of Care (Signed)
  Problem: Education: Goal: Knowledge of General Education information will improve Description Including pain rating scale, medication(s)/side effects and non-pharmacologic comfort measures Outcome: Progressing   Problem: Activity: Goal: Risk for activity intolerance will decrease Outcome: Progressing   Problem: Safety: Goal: Ability to remain free from injury will improve Outcome: Progressing   

## 2022-01-30 NOTE — ED Provider Notes (Signed)
[facility] DEPT Provider Note   CSN: [id number] Arrival date & time: 01/30/22  0932     History  Chief Complaint  Patient presents with   Fall   Leg Injury    FROILAN MCLEAN is a 81 y.o. male.  HPI Patient presents from home via EMS.  He had a fall at home 2 days ago.  Since that time, he was ambulating with his walker.  Last night, he was unable to get out of bed and stand due to the pain in the proximal right leg.  This continued today.  Family noticed swelling to this area.  Patient received 100 mcg of fentanyl prior to arrival.  He endorses pain in the proximal right leg but denies any other areas of discomfort.    Home Medications Prior to Admission medications   Medication Sig Start Date End Date Taking? Authorizing Provider  acetaminophen (TYLENOL) 500 MG tablet Take 1,000 mg by mouth every 6 (six) hours as needed for mild pain.   Yes [provider]  amLODipine (NORVASC) 5 MG tablet TAKE 1 TABLET (5 MG TOTAL) BY MOUTH DAILY. 07/05/20  Yes Elayne Snare, MD  aspirin 81 MG chewable tablet Chew 81 mg by mouth at bedtime.   Yes [provider]  FARXIGA 5 MG TABS tablet Take 5 mg by mouth daily. 01/15/22  Yes [provider]  ferrous sulfate 325 (65 FE) MG tablet Take 325 mg by mouth every morning. 12/28/21  Yes [provider]  glimepiride (AMARYL) 1 MG tablet Take 1.5 mg by mouth every morning. 12/11/21  Yes [provider]  simvastatin (ZOCOR) 20 MG tablet TAKE 1 TABLET BY MOUTH EVERY DAY IN THE EVENING Patient taking differently: Take 20 mg by mouth every evening. 03/30/20  Yes Elayne Snare, MD  spironolactone (ALDACTONE) 25 MG tablet Take 12.5 mg by mouth daily. 01/23/22  Yes [provider]  torsemide (DEMADEX) 10 MG tablet Take 10 mg by mouth daily as needed for edema. 10/19/21  Yes [provider]  Vitamin D, Ergocalciferol, (DRISDOL) 1.25 MG (50000 UNIT) CAPS capsule Take 50,000 Units  by mouth once a week. Saturday 01/05/22  Yes [provider]  glucose blood (ONETOUCH ULTRA) test strip USE AS DIRECTED TO TEST TWICE DAILY 05/13/20   Elayne Snare, MD  Lancets (ONETOUCH DELICA PLUS [id number]) [place] 2X DAILY. 06/06/20   Elayne Snare, MD  [facility] DELICA LANCETS FINE MISC USED TO CHECK SUGAR 2X DAILY. 12/28/16   Elayne Snare, MD      Allergies    Amlodipine    Review of Systems   Review of Systems  Musculoskeletal:  Positive for arthralgias, gait problem and joint swelling.  All other systems reviewed and are negative.   Physical Exam Updated Vital Signs BP 121/69 (BP Location: Left Arm)   Pulse 85   Temp 98.4 F (36.9 C) (Oral)   Resp 16   Ht 5\' 8"  (1.727 m)   Wt 61.3 kg   SpO2 100%   BMI 20.55 kg/m  Physical Exam Vitals and nursing note reviewed.  Constitutional:      General: He is not in acute distress.    Appearance: Normal appearance. He is well-developed. He is not ill-appearing, toxic-appearing or diaphoretic.  HENT:     Head: Normocephalic and atraumatic.     Right Ear: External ear normal.     Left Ear: External ear normal.     Ears:  Comments: Baseline presbycusis    Nose: Nose normal.     Mouth/Throat:     Mouth: Mucous membranes are moist.     Pharynx: Oropharynx is clear.  Eyes:     Extraocular Movements: Extraocular movements intact.     Conjunctiva/sclera: Conjunctivae normal.  Cardiovascular:     Rate and Rhythm: Normal rate and regular rhythm.     Heart sounds: No murmur heard. Pulmonary:     Effort: Pulmonary effort is normal. No respiratory distress.     Breath sounds: Normal breath sounds. No wheezing or rales.  Chest:     Chest wall: No tenderness.  Abdominal:     General: There is no distension.     Palpations: Abdomen is soft.     Tenderness: There is no abdominal tenderness.  Musculoskeletal:        General: Swelling, tenderness, deformity and signs of injury present.     Cervical back: Normal  range of motion and neck supple.  Skin:    General: Skin is warm and dry.  Neurological:     General: No focal deficit present.     Mental Status: He is alert and oriented to person, place, and time.     Cranial Nerves: No cranial nerve deficit.     Sensory: No sensory deficit.     Motor: No weakness.     Coordination: Coordination normal.  Psychiatric:        Mood and Affect: Mood normal.        Behavior: Behavior normal.        Thought Content: Thought content normal.        Judgment: Judgment normal.     ED Results / Procedures / Treatments   Labs (all labs ordered are listed, but only abnormal results are displayed) Labs Reviewed  CBC WITH DIFFERENTIAL/PLATELET - Abnormal; Notable for the following components:      Result Value   WBC 13.5 (*)    RBC 2.45 (*)    Hemoglobin 7.2 (*)    HCT 23.1 (*)    Neutro Abs 11.4 (*)    Lymphs Abs 0.6 (*)    Monocytes Absolute 1.3 (*)    All other components within normal limits  COMPREHENSIVE METABOLIC PANEL - Abnormal; Notable for the following components:   Chloride 112 (*)    CO2 19 (*)    Glucose, Bld 204 (*)    BUN 77 (*)    Creatinine, Ser 2.16 (*)    Calcium 8.6 (*)    Total Protein 6.3 (*)    Albumin 2.8 (*)    AST 14 (*)    GFR, Estimated 30 (*)    All other components within normal limits  MAGNESIUM  HEMOGLOBIN A1C  IRON AND TIBC    EKG None  Radiology DG Hip Unilat W or Wo Pelvis 2-3 Views Right  Result Date: 01/30/2022 CLINICAL DATA:  Fall 2 days ago. EXAM: DG HIP (WITH OR WITHOUT PELVIS) 2-3V RIGHT; RIGHT FEMUR 2 VIEWS; DG HIP (WITH OR WITHOUT PELVIS) 2-3V LEFT COMPARISON:  None Available. FINDINGS: Right hip: There is diffuse decreased bone mineralization. There is a comminuted fracture involving the superior through inferior aspect of the right proximal femoral intertrochanteric region and likely the adjacent distal right femoral neck. There is up to approximately 2 cm medial displacement of the right lesser  trochanter. The right femoroacetabular joint remains normally located. Moderate left and mild right femoroacetabular joint space narrowing. -- Left hip: There is moderate chronic  appearing heterotopic bone formation superior to the left greater trochanter and inferior to the right femoral neck. No definite left hip fracture is visualized. Lower lumbar spine bilateral transpedicular rod and screw fusion hardware. -- Right femur: There is diffuse decreased bone mineralization. Severe patellofemoral joint space narrowing and peripheral osteophytosis. Severe lateral greater than medial compartment of the knee joint space narrowing and peripheral osteophytosis. No acute fracture is seen within the distal right femur. Small knee joint effusion. Moderate right thigh vascular calcifications. IMPRESSION: 1. Acute, comminuted fracture of the right proximal femoral intertrochanteric region and likely the adjacent distal right femoral neck. 2. Moderate left and mild right femoroacetabular osteoarthritis. 3. Severe tricompartmental osteoarthritis of the right knee. Electronically Signed   By: Yvonne Kendall M.D.   On: 01/30/2022 10:57   DG Femur Min 2 Views Right  Result Date: 01/30/2022 CLINICAL DATA:  Fall 2 days ago. EXAM: DG HIP (WITH OR WITHOUT PELVIS) 2-3V RIGHT; RIGHT FEMUR 2 VIEWS; DG HIP (WITH OR WITHOUT PELVIS) 2-3V LEFT COMPARISON:  None Available. FINDINGS: Right hip: There is diffuse decreased bone mineralization. There is a comminuted fracture involving the superior through inferior aspect of the right proximal femoral intertrochanteric region and likely the adjacent distal right femoral neck. There is up to approximately 2 cm medial displacement of the right lesser trochanter. The right femoroacetabular joint remains normally located. Moderate left and mild right femoroacetabular joint space narrowing. -- Left hip: There is moderate chronic appearing heterotopic bone formation superior to the left greater  trochanter and inferior to the right femoral neck. No definite left hip fracture is visualized. Lower lumbar spine bilateral transpedicular rod and screw fusion hardware. -- Right femur: There is diffuse decreased bone mineralization. Severe patellofemoral joint space narrowing and peripheral osteophytosis. Severe lateral greater than medial compartment of the knee joint space narrowing and peripheral osteophytosis. No acute fracture is seen within the distal right femur. Small knee joint effusion. Moderate right thigh vascular calcifications. IMPRESSION: 1. Acute, comminuted fracture of the right proximal femoral intertrochanteric region and likely the adjacent distal right femoral neck. 2. Moderate left and mild right femoroacetabular osteoarthritis. 3. Severe tricompartmental osteoarthritis of the right knee. Electronically Signed   By: Yvonne Kendall M.D.   On: 01/30/2022 10:57   DG Hip Unilat W or Wo Pelvis 2-3 Views Left  Result Date: 01/30/2022 CLINICAL DATA:  Fall 2 days ago. EXAM: DG HIP (WITH OR WITHOUT PELVIS) 2-3V RIGHT; RIGHT FEMUR 2 VIEWS; DG HIP (WITH OR WITHOUT PELVIS) 2-3V LEFT COMPARISON:  None Available. FINDINGS: Right hip: There is diffuse decreased bone mineralization. There is a comminuted fracture involving the superior through inferior aspect of the right proximal femoral intertrochanteric region and likely the adjacent distal right femoral neck. There is up to approximately 2 cm medial displacement of the right lesser trochanter. The right femoroacetabular joint remains normally located. Moderate left and mild right femoroacetabular joint space narrowing. -- Left hip: There is moderate chronic appearing heterotopic bone formation superior to the left greater trochanter and inferior to the right femoral neck. No definite left hip fracture is visualized. Lower lumbar spine bilateral transpedicular rod and screw fusion hardware. -- Right femur: There is diffuse decreased bone  mineralization. Severe patellofemoral joint space narrowing and peripheral osteophytosis. Severe lateral greater than medial compartment of the knee joint space narrowing and peripheral osteophytosis. No acute fracture is seen within the distal right femur. Small knee joint effusion. Moderate right thigh vascular calcifications. IMPRESSION: 1. Acute, comminuted fracture of the  right proximal femoral intertrochanteric region and likely the adjacent distal right femoral neck. 2. Moderate left and mild right femoroacetabular osteoarthritis. 3. Severe tricompartmental osteoarthritis of the right knee. Electronically Signed   By: Yvonne Kendall M.D.   On: 01/30/2022 10:57   DG Chest 1 View  Result Date: 01/30/2022 CLINICAL DATA:  Trauma, fall EXAM: CHEST  1 VIEW COMPARISON:  08/10/2021 FINDINGS: Cardiac size is within normal limits. There is poor inspiration. Increased interstitial markings are seen in parahilar regions and lower lung fields. There is no focal consolidation. There is no pleural effusion or pneumothorax. Degenerative changes with possible chronic tear of rotator cuff is seen in both shoulders. IMPRESSION: Increased markings in parahilar regions and lower lung fields may suggest crowding of bronchovascular structures due to poor inspiration or interstitial pneumonia. Electronically Signed   By: Elmer Picker M.D.   On: 01/30/2022 10:55    Procedures Procedures    Medications Ordered in ED Medications  amLODipine (NORVASC) tablet 5 mg (has no administration in time range)  aspirin chewable tablet 81 mg (has no administration in time range)  simvastatin (ZOCOR) tablet 20 mg (has no administration in time range)  spironolactone (ALDACTONE) tablet 12.5 mg (has no administration in time range)  ferrous sulfate tablet 325 mg (has no administration in time range)  HYDROcodone-acetaminophen (NORCO/VICODIN) 5-325 MG per tablet 1-2 tablet (has no administration in time range)  morphine (PF) 2  MG/ML injection 0.5 mg (has no administration in time range)  insulin aspart (novoLOG) injection 0-9 Units (has no administration in time range)    ED Course/ Medical Decision Making/ A&P                           Medical Decision Making Amount and/or Complexity of Data Reviewed Labs: ordered. Radiology: ordered.  Risk Prescription drug management. Decision regarding hospitalization.   This patient presents to the ED for concern of fall, this involves an extensive number of treatment options, and is a complaint that carries with it a high risk of complications and morbidity.  The differential diagnosis includes fracture, pelvic fracture, femur fracture   Co morbidities that complicate the patient evaluation  DM 2, HTN, HLD, CKD, anemia, arthritis   Additional history obtained:  Additional history obtained from EMS, patient's family External records from outside source obtained and reviewed including EMR   Lab Tests:  I Ordered, and personally interpreted labs.  The pertinent results include: Baseline CKD, grossly normal electrolytes, decreased hemoglobin when compared to 5 months ago, leukocytosis is present.   Imaging Studies ordered:  I ordered imaging studies including x-ray imaging of chest and bilateral hips I independently visualized and interpreted imaging which showed acute comminuted fracture of right proximal femoral intertrochanteric region and possibly adjacent femoral neck I agree with the radiologist interpretation   Cardiac Monitoring: / EKG:  The patient was maintained on a cardiac monitor.  I personally viewed and interpreted the cardiac monitored which showed an underlying rhythm of: Sinus rhythm   Consultations Obtained:  I requested consultation with the orthopedic surgeon, Dr. Stann Mainland,  and discussed lab and imaging findings as well as pertinent plan - they recommend: Admission to [facility] at midnight.  Surgical plan to  follow.   Problem List / ED Course / Critical interventions / Medication management  Patient is a pleasant 81 year old male presenting from home, where he lives with family, for an unwitnessed fall 2 days ago.  During this fall,  he landed on his right hip.  He was able to be helped back to his feet and was able to bear weight on his right leg.  Starting last night, he had increased pain and swelling in his proximal right leg.  He has no longer been able to bear weight.  On arrival in the ED, patient is alert and oriented.  He does have swelling and tenderness that is present to proximal right leg.  This does raise concern of hip and/or femur fracture.  X-ray imaging was ordered.  Patient received 100 mcg of fentanyl prior to arrival.  As needed fentanyl was ordered for continued analgesia.  Laboratory work-up was initiated anticipation of admission.  X-ray imaging of right hip showed acute comminuted fracture.  There was an abnormal finding on the left hip and an additional x-ray was ordered.  This does appear to be diffuse decreased bone mineralization.  Patient remained hemodynamically stable while in the ED.  Family companied in the bedside.  They were informed of imaging results.  I spoke with orthopedic surgeon on-call who advises admission to hospitalist at [facility] at midnight, and orthopedic team will discuss further for operative plans.  Patient was admitted for further management. I ordered medication including fentanyl for analgesia  Reevaluation of the patient after these medicines showed that the patient stayed the same I have reviewed the patients home medicines and have made adjustments as needed   Social Determinants of Health:  Lives at home with family         Final Clinical Impression(s) / ED Diagnoses Final diagnoses:  Closed displaced intertrochanteric fracture of right femur, initial encounter [facility])    Rx / DC Orders ED Discharge Orders     None          Godfrey Pick, MD 01/30/22 1652

## 2022-01-30 NOTE — H&P (View-Only) (Signed)
ORTHOPAEDIC CONSULTATION  REQUESTING PHYSICIAN: Jonnie Finner, DO  PCP:  Patrecia Pour, Christean Grief, MD  Chief Complaint: Right hip pain  HPI: Jason Mcpherson is a 81 y.o. male who complains of right hip pain after a fall on 01/28/2022.  He had an unwitnessed fall walking with a walker, did not pass out.  His family helped him up and he was able to walk without issues and stand after that.  The next day began having pain and difficulty mobilizing.  This progressed to having the inability to walk and weight-bear secondary to pain today.  He does have a past medical history of hypertension, CKD type III, type 2 diabetes mellitus, HLD.  He is companied by his wife and son today.  They report that he progressively was getting worse with his right hip over the past 2 days at the time of the fall.  They unsure exactly how he fell.  He typically uses a rolling walker and a normal walker at home.  He has a ramp into his house as well as a sit up recliner and hospital bed.  Bedrooms on the first floor, no steps to get in the house.  Patient able to respond to questions competently, is hard of hearing with better hearing on the left ear.  Past Medical History:  Diagnosis Date   Anemia    Arthritis    KNEES & BACK   Cellulitis 08/2013   RT LEG   Chronic kidney disease 08/2013   ACUTE RENAL    Dehydration    Diabetes mellitus without complication ([place])    Hyperkalemia 08/2013   Hyperlipemia    Hypertension    Past Surgical History:  Procedure Laterality Date   ANTERIOR CERVICAL DECOMP/DISCECTOMY FUSION N/A 10/17/2012   Procedure: ANTERIOR CERVICAL DECOMPRESSION/DISCECTOMY FUSION 2 LEVELS;  Surgeon: Charlie Pitter, MD;  Location: [facility];  Service: Neurosurgery;  Laterality: N/A;  Cervical three-four, four-five anterior cervical decompression fusion with allograft plating   BACK SURGERY     10 years ago   [place] History   Socioeconomic History   Marital status:  Married    Spouse name: Not on file   Number of children: Not on file   Years of education: Not on file   Highest education level: Not on file  Occupational History   Not on file  Tobacco Use   Smoking status: Former    Types: Cigarettes    Quit date: 10/12/1997    Years since quitting: 24.3   Smokeless tobacco: Never  Substance and Sexual Activity   Alcohol use: No   Drug use: No   Sexual activity: Not on file  Other Topics Concern   Not on file  Social History Narrative   Not on file   Social Determinants of Health   Financial Resource Strain: Not on file  Food Insecurity: Not on file  Transportation Needs: Not on file  Physical Activity: Not on file  Stress: Not on file  Social Connections: Not on file   Family History  Problem Relation Age of Onset   Cancer - Colon Mother    Cancer - Prostate Father    Allergies  Allergen Reactions   Amlodipine Swelling    ANKLE EDEMA   Prior to Admission medications   Medication Sig Start Date End Date Taking? Authorizing Provider  acetaminophen (TYLENOL) 500 MG tablet Take 1,000 mg by mouth every 6 (six) hours as needed for  mild pain.   Yes [provider]  amLODipine (NORVASC) 5 MG tablet TAKE 1 TABLET (5 MG TOTAL) BY MOUTH DAILY. 07/05/20  Yes Elayne Snare, MD  aspirin 81 MG chewable tablet Chew 81 mg by mouth at bedtime.   Yes [provider]  FARXIGA 5 MG TABS tablet Take 5 mg by mouth daily. 01/15/22  Yes [provider]  ferrous sulfate 325 (65 FE) MG tablet Take 325 mg by mouth every morning. 12/28/21  Yes [provider]  glimepiride (AMARYL) 1 MG tablet Take 1.5 mg by mouth every morning. 12/11/21  Yes [provider]  simvastatin (ZOCOR) 20 MG tablet TAKE 1 TABLET BY MOUTH EVERY DAY IN THE EVENING Patient taking differently: Take 20 mg by mouth every evening. 03/30/20  Yes Elayne Snare, MD  spironolactone (ALDACTONE) 25 MG tablet Take 12.5 mg by mouth daily. 01/23/22  Yes [provider]  torsemide (DEMADEX) 10 MG tablet Take 10 mg by mouth daily as needed for edema. 10/19/21  Yes [provider]  Vitamin D, Ergocalciferol, (DRISDOL) 1.25 MG (50000 UNIT) CAPS capsule Take 50,000 Units by mouth once a week. Saturday 01/05/22  Yes [provider]  glucose blood (ONETOUCH ULTRA) test strip USE AS DIRECTED TO TEST TWICE DAILY 05/13/20   Elayne Snare, MD  Lancets (ONETOUCH DELICA PLUS [id number]) [place] 2X DAILY. 06/06/20   Elayne Snare, MD  [facility] DELICA LANCETS FINE MISC USED TO CHECK SUGAR 2X DAILY. 12/28/16   Elayne Snare, MD   DG Hip Unilat W or Wo Pelvis 2-3 Views Right  Result Date: 01/30/2022 CLINICAL DATA:  Fall 2 days ago. EXAM: DG HIP (WITH OR WITHOUT PELVIS) 2-3V RIGHT; RIGHT FEMUR 2 VIEWS; DG HIP (WITH OR WITHOUT PELVIS) 2-3V LEFT COMPARISON:  None Available. FINDINGS: Right hip: There is diffuse decreased bone mineralization. There is a comminuted fracture involving the superior through inferior aspect of the right proximal femoral intertrochanteric region and likely the adjacent distal right femoral neck. There is up to approximately 2 cm medial displacement of the right lesser trochanter. The right femoroacetabular joint remains normally located. Moderate left and mild right femoroacetabular joint space narrowing. -- Left hip: There is moderate chronic appearing heterotopic bone formation superior to the left greater trochanter and inferior to the right femoral neck. No definite left hip fracture is visualized. Lower lumbar spine bilateral transpedicular rod and screw fusion hardware. -- Right femur: There is diffuse decreased bone mineralization. Severe patellofemoral joint space narrowing and peripheral osteophytosis. Severe lateral greater than medial compartment of the knee joint space narrowing and peripheral osteophytosis. No acute fracture is seen within the distal right femur. Small knee joint effusion. Moderate right thigh  vascular calcifications. IMPRESSION: 1. Acute, comminuted fracture of the right proximal femoral intertrochanteric region and likely the adjacent distal right femoral neck. 2. Moderate left and mild right femoroacetabular osteoarthritis. 3. Severe tricompartmental osteoarthritis of the right knee. Electronically Signed   By: Yvonne Kendall M.D.   On: 01/30/2022 10:57   DG Femur Min 2 Views Right  Result Date: 01/30/2022 CLINICAL DATA:  Fall 2 days ago. EXAM: DG HIP (WITH OR WITHOUT PELVIS) 2-3V RIGHT; RIGHT FEMUR 2 VIEWS; DG HIP (WITH OR WITHOUT PELVIS) 2-3V LEFT COMPARISON:  None Available. FINDINGS: Right hip: There is diffuse decreased bone mineralization. There is a comminuted fracture involving the superior through inferior aspect of the right proximal femoral intertrochanteric region and likely the adjacent distal right femoral neck. There is up  to approximately 2 cm medial displacement of the right lesser trochanter. The right femoroacetabular joint remains normally located. Moderate left and mild right femoroacetabular joint space narrowing. -- Left hip: There is moderate chronic appearing heterotopic bone formation superior to the left greater trochanter and inferior to the right femoral neck. No definite left hip fracture is visualized. Lower lumbar spine bilateral transpedicular rod and screw fusion hardware. -- Right femur: There is diffuse decreased bone mineralization. Severe patellofemoral joint space narrowing and peripheral osteophytosis. Severe lateral greater than medial compartment of the knee joint space narrowing and peripheral osteophytosis. No acute fracture is seen within the distal right femur. Small knee joint effusion. Moderate right thigh vascular calcifications. IMPRESSION: 1. Acute, comminuted fracture of the right proximal femoral intertrochanteric region and likely the adjacent distal right femoral neck. 2. Moderate left and mild right femoroacetabular osteoarthritis. 3. Severe  tricompartmental osteoarthritis of the right knee. Electronically Signed   By: Yvonne Kendall M.D.   On: 01/30/2022 10:57   DG Hip Unilat W or Wo Pelvis 2-3 Views Left  Result Date: 01/30/2022 CLINICAL DATA:  Fall 2 days ago. EXAM: DG HIP (WITH OR WITHOUT PELVIS) 2-3V RIGHT; RIGHT FEMUR 2 VIEWS; DG HIP (WITH OR WITHOUT PELVIS) 2-3V LEFT COMPARISON:  None Available. FINDINGS: Right hip: There is diffuse decreased bone mineralization. There is a comminuted fracture involving the superior through inferior aspect of the right proximal femoral intertrochanteric region and likely the adjacent distal right femoral neck. There is up to approximately 2 cm medial displacement of the right lesser trochanter. The right femoroacetabular joint remains normally located. Moderate left and mild right femoroacetabular joint space narrowing. -- Left hip: There is moderate chronic appearing heterotopic bone formation superior to the left greater trochanter and inferior to the right femoral neck. No definite left hip fracture is visualized. Lower lumbar spine bilateral transpedicular rod and screw fusion hardware. -- Right femur: There is diffuse decreased bone mineralization. Severe patellofemoral joint space narrowing and peripheral osteophytosis. Severe lateral greater than medial compartment of the knee joint space narrowing and peripheral osteophytosis. No acute fracture is seen within the distal right femur. Small knee joint effusion. Moderate right thigh vascular calcifications. IMPRESSION: 1. Acute, comminuted fracture of the right proximal femoral intertrochanteric region and likely the adjacent distal right femoral neck. 2. Moderate left and mild right femoroacetabular osteoarthritis. 3. Severe tricompartmental osteoarthritis of the right knee. Electronically Signed   By: Yvonne Kendall M.D.   On: 01/30/2022 10:57   DG Chest 1 View  Result Date: 01/30/2022 CLINICAL DATA:  Trauma, fall EXAM: CHEST  1 VIEW COMPARISON:   08/10/2021 FINDINGS: Cardiac size is within normal limits. There is poor inspiration. Increased interstitial markings are seen in parahilar regions and lower lung fields. There is no focal consolidation. There is no pleural effusion or pneumothorax. Degenerative changes with possible chronic tear of rotator cuff is seen in both shoulders. IMPRESSION: Increased markings in parahilar regions and lower lung fields may suggest crowding of bronchovascular structures due to poor inspiration or interstitial pneumonia. Electronically Signed   By: Elmer Picker M.D.   On: 01/30/2022 10:55    Positive ROS: All other systems have been reviewed and were otherwise negative with the exception of those mentioned in the HPI and as above.  Physical Exam: General: Alert, no acute distress Cardiovascular: No pedal edema Respiratory: No cyanosis, no use of accessory musculature GI: No organomegaly, abdomen is soft and non-tender Skin: No lesions in the area of chief complaint Neurologic:  Sensation intact distally Psychiatric: Patient is competent for consent with normal mood and affect Lymphatic: No axillary or cervical lymphadenopathy  MUSCULOSKELETAL:   Healed blanchable thin tissue at the sacrum.  No frank ulcers.  No tenderness palpation of the left hip, left knee, left foot.  No tenderness palpation of the bilateral upper extremities.  Bruising noted to the bilateral hands. Right hip : Small bruise noted at the right hip.  Hip is resting in externally rotated position.  Mild pain at the knee, moderate pain at the mid thigh and hip.  Able to move the foot and ankle and digits.  DP pulse 2+.  Sensation intact distally.  Assessment: Right hip intertrochanteric fracture with extension to the femoral neck.  Right knee osteoarthritis  Plan: Plan for surgical fixation likely IMN.  Plan for surgical fixation by Dr. Lyla Glassing tomorrow.  N.p.o. at midnight.  Admit to medicine.   Risk and benefits were  discussed with the patient and his wife and son including risk of infection, bleeding, injury to nerves and vessels, DVT, failure of hardware, risk of anesthesia.  They elected to proceed.     Faythe Casa, PA    01/30/2022 2:31 PM  I agree with the above findings and recommended plan as outlined above by PA Mcclung.  Dr. Lyla Glassing to assume care tomorrow. Nicholes Stairs

## 2022-01-30 NOTE — ED Triage Notes (Signed)
Pt arrives via [facility] from home for a fall on Thursday. Reportedly pt was ambulatory with a walker afterwards. Noticeable swelling noted to R thigh. Pt received 100 mcg fentanyl enroute.

## 2022-01-31 ENCOUNTER — Encounter (HOSPITAL_COMMUNITY): Payer: Self-pay | Admitting: Registered Nurse

## 2022-01-31 ENCOUNTER — Encounter (HOSPITAL_COMMUNITY)
Admission: EM | Disposition: A | Discharge status: Skilled Nursing Facility | Payer: Self-pay | Source: Home / Self Care | Attending: Internal Medicine

## 2022-01-31 DIAGNOSIS — I10 Essential (primary) hypertension: Secondary | ICD-10-CM

## 2022-01-31 DIAGNOSIS — Y92009 Unspecified place in unspecified non-institutional (private) residence as the place of occurrence of the external cause: Secondary | ICD-10-CM

## 2022-01-31 DIAGNOSIS — W19XXXA Unspecified fall, initial encounter: Secondary | ICD-10-CM | POA: Diagnosis not present

## 2022-01-31 DIAGNOSIS — N1831 Chronic kidney disease, stage 3a: Secondary | ICD-10-CM

## 2022-01-31 DIAGNOSIS — E119 Type 2 diabetes mellitus without complications: Secondary | ICD-10-CM

## 2022-01-31 DIAGNOSIS — S72141A Displaced intertrochanteric fracture of right femur, initial encounter for closed fracture: Secondary | ICD-10-CM

## 2022-01-31 DIAGNOSIS — E7800 Pure hypercholesterolemia, unspecified: Secondary | ICD-10-CM

## 2022-01-31 LAB — HEMOGLOBIN AND HEMATOCRIT, BLOOD
HCT: 22 % — ABNORMAL LOW (ref 39.0–52.0)
HCT: 29.6 % — ABNORMAL LOW (ref 39.0–52.0)
Hemoglobin: 6.5 g/dL — CL (ref 13.0–17.0)
Hemoglobin: 9.4 g/dL — ABNORMAL LOW (ref 13.0–17.0)

## 2022-01-31 LAB — BASIC METABOLIC PANEL
Anion gap: 7 (ref 5–15)
BUN: 79 mg/dL — ABNORMAL HIGH (ref 8–23)
CO2: 18 mmol/L — ABNORMAL LOW (ref 22–32)
Calcium: 8.5 mg/dL — ABNORMAL LOW (ref 8.9–10.3)
Chloride: 115 mmol/L — ABNORMAL HIGH (ref 98–111)
Creatinine, Ser: 2.54 mg/dL — ABNORMAL HIGH (ref 0.61–1.24)
GFR, Estimated: 25 mL/min — ABNORMAL LOW (ref >60)
Glucose, Bld: 182 mg/dL — ABNORMAL HIGH (ref 70–99)
Potassium: 4.4 mmol/L (ref 3.5–5.1)
Sodium: 140 mmol/L (ref 135–145)

## 2022-01-31 LAB — CBC
HCT: 21.4 % — ABNORMAL LOW (ref 39.0–52.0)
Hemoglobin: 6.6 g/dL — CL (ref 13.0–17.0)
MCH: 29.6 pg (ref 26.0–34.0)
MCHC: 30.8 g/dL (ref 30.0–36.0)
MCV: 96 fL (ref 80.0–100.0)
Platelets: 215 10*3/uL (ref 150–400)
RBC: 2.23 MIL/uL — ABNORMAL LOW (ref 4.22–5.81)
RDW: 15.3 % (ref 11.5–15.5)
WBC: 12.3 10*3/uL — ABNORMAL HIGH (ref 4.0–10.5)
nRBC: 0 % (ref 0.0–0.2)

## 2022-01-31 LAB — PREPARE RBC (CROSSMATCH)

## 2022-01-31 LAB — GLUCOSE, CAPILLARY
Glucose-Capillary: 141 mg/dL — ABNORMAL HIGH (ref 70–99)
Glucose-Capillary: 193 mg/dL — ABNORMAL HIGH (ref 70–99)
Glucose-Capillary: 281 mg/dL — ABNORMAL HIGH (ref 70–99)
Glucose-Capillary: 337 mg/dL — ABNORMAL HIGH (ref 70–99)

## 2022-01-31 LAB — ABO/RH: ABO/RH(D): O POS

## 2022-01-31 SURGERY — FIXATION, FRACTURE, INTERTROCHANTERIC, WITH INTRAMEDULLARY ROD
Anesthesia: Choice

## 2022-01-31 MED ORDER — ADULT MULTIVITAMIN W/MINERALS CH
1.0000 | ORAL_TABLET | Freq: Every day | ORAL | Status: DC
  Administered 2022-01-31 – 2022-02-08 (×8): 1 via ORAL
  Filled 2022-01-31 (×8): qty 1

## 2022-01-31 MED ORDER — CHLORHEXIDINE GLUCONATE 4 % EX LIQD
60.0000 mL | Freq: Once | CUTANEOUS | Status: AC
  Administered 2022-01-31: 4 via TOPICAL

## 2022-01-31 MED ORDER — SODIUM CHLORIDE 0.9% IV SOLUTION: Freq: Once | INTRAVENOUS | Status: AC

## 2022-01-31 MED ORDER — ENSURE ENLIVE PO LIQD
237.0000 mL | Freq: Two times a day (BID) | ORAL | Status: DC
  Administered 2022-01-31 – 2022-02-02 (×3): 237 mL via ORAL

## 2022-01-31 MED ORDER — HEPARIN SODIUM (PORCINE) 5000 UNIT/ML IJ SOLN
5000.0000 [IU] | Freq: Three times a day (TID) | INTRAMUSCULAR | Status: DC
  Administered 2022-01-31: 5000 [IU] via SUBCUTANEOUS
  Filled 2022-01-31: qty 1

## 2022-01-31 SURGICAL SUPPLY — 37 items
ADH SKN CLS APL DERMABOND .7 (GAUZE/BANDAGES/DRESSINGS) ×2
APL PRP STRL LF DISP 70% ISPRP (MISCELLANEOUS) ×1
BAG COUNTER SPONGE SURGICOUNT (BAG) IMPLANT
BAG SPEC THK2 15X12 ZIP CLS (MISCELLANEOUS)
BAG SPNG CNTER NS LX DISP (BAG)
BAG ZIPLOCK 12X15 (MISCELLANEOUS) IMPLANT
CHLORAPREP W/TINT 26 (MISCELLANEOUS) ×2 IMPLANT
COVER PERINEAL POST (MISCELLANEOUS) ×2 IMPLANT
COVER SURGICAL LIGHT HANDLE (MISCELLANEOUS) ×2 IMPLANT
DERMABOND ADVANCED .7 DNX12 (GAUZE/BANDAGES/DRESSINGS) ×4 IMPLANT
DRAPE C-ARM 42X120 X-RAY (DRAPES) ×2 IMPLANT
DRAPE C-ARMOR (DRAPES) ×2 IMPLANT
DRAPE IMP U-DRAPE 54X76 (DRAPES) ×4 IMPLANT
DRAPE SHEET LG 3/4 BI-LAMINATE (DRAPES) ×4 IMPLANT
DRAPE STERI IOBAN 125X83 (DRAPES) ×2 IMPLANT
DRAPE U-SHAPE 47X51 STRL (DRAPES) ×4 IMPLANT
DRESSING MEPILEX FLEX 4X4 (GAUZE/BANDAGES/DRESSINGS) ×4 IMPLANT
DRSG MEPILEX FLEX 4X4 (GAUZE/BANDAGES/DRESSINGS) ×2
DRSG MEPILEX POST OP 4X8 (GAUZE/BANDAGES/DRESSINGS) IMPLANT
ELECT BLADE TIP CTD 4 INCH (ELECTRODE) IMPLANT
FACESHIELD WRAPAROUND (MASK) ×2 IMPLANT
FACESHIELD WRAPAROUND OR TEAM (MASK) ×2 IMPLANT
GAUZE SPONGE 4X4 12PLY STRL (GAUZE/BANDAGES/DRESSINGS) ×2 IMPLANT
GLOVE BIO SURGEON STRL SZ8.5 (GLOVE) ×4 IMPLANT
GLOVE BIOGEL M 7.0 STRL (GLOVE) ×2 IMPLANT
GLOVE BIOGEL PI IND STRL 7.5 (GLOVE) ×2 IMPLANT
GLOVE BIOGEL PI IND STRL 8 (GLOVE) ×2 IMPLANT
GLOVE BIOGEL PI IND STRL 8.5 (GLOVE) ×2 IMPLANT
GLOVE SURG LX STRL 7.5 STRW (GLOVE) ×4 IMPLANT
GOWN SPEC L3 XXLG W/TWL (GOWN DISPOSABLE) ×2 IMPLANT
KIT BASIN OR (CUSTOM PROCEDURE TRAY) ×2 IMPLANT
MANIFOLD NEPTUNE II (INSTRUMENTS) ×2 IMPLANT
MARKER SKIN DUAL TIP RULER LAB (MISCELLANEOUS) ×2 IMPLANT
PACK GENERAL/GYN (CUSTOM PROCEDURE TRAY) ×2 IMPLANT
SUT MNCRL AB 3-0 PS2 18 (SUTURE) ×2 IMPLANT
SUT VIC AB 1 CT1 36 (SUTURE) ×2 IMPLANT
TOWEL OR 17X26 10 PK STRL BLUE (TOWEL DISPOSABLE) ×2 IMPLANT

## 2022-01-31 NOTE — Plan of Care (Signed)
  Problem: Education: Goal: Knowledge of General Education information will improve Description Including pain rating scale, medication(s)/side effects and non-pharmacologic comfort measures Outcome: Progressing   

## 2022-01-31 NOTE — Progress Notes (Signed)
PACU notified about Hgb of 6.6 on this patients this morning.  Lab has been called per MD order to redraw H&H.  Lab has not been to the floor to redraw yet, PACU notified of Hgb 6.6 and informed about redraw order.

## 2022-01-31 NOTE — Progress Notes (Signed)
Initial Nutrition Assessment RD working remotely.  DOCUMENTATION CODES:   Not applicable  INTERVENTION:  - will order Ensure Plus High Protein BID, each supplement provides 350 kcal and 20 grams of protein.  - complete NFPE when feasible.   NUTRITION DIAGNOSIS:   Increased nutrient needs related to hip fracture, post-op healing as evidenced by estimated needs.  GOAL:   Patient will meet greater than or equal to 90% of their needs  MONITOR:   PO intake, Supplement acceptance, Labs, Weight trends  REASON FOR ASSESSMENT:   Consult Assessment of nutrition requirement/status  ASSESSMENT:   81 y.o. male with medical history of HTN, stage 3 CKD, type 2 DM, HLD, and arthritis. He presented to the ED due to R hip pain after a fall two days prior. He was able to stand on his own without pain or difficulty that day but then next day, the day PTA, he began having pain and difficulty mobilizing with his walker.  Diet advanced from NPO to Heart Healthy/Carb Modified yesterday at 1506 and patient ate 90% of dinner and 100% of breakfast today. Patient to return to NPO status tonight at midnight.   Patient noted to be anemic. Orthopedic PA note indicates plan for blood transfusion today and postpone surgery to tomorrow, 10/2 for R hip fracture.  He has not been assessed by a  RD since 2015.   Weight yesterday was 135 lb and weight on 08/10/21 was 150 lb. This indicates 15 lb weight loss (10% body weight) in the past 5 months; significant for time frame.     Labs reviewed; CBGs: 141 and 193 mg/dl, Cl: 115 mmol/l, BUN: 79 mg/dl, creatinine: 2.54 mg/dl, Ca: 8.5 mg/dl, GFR: 25 ml/min.  Medications reviewed; 325 mg ferrous sulfate/day, sliding scale novolog, 12.5 mg aldactone/day.    NUTRITION - FOCUSED PHYSICAL EXAM:  RD working remotely.  Diet Order:   Diet Order             Diet NPO time specified  Diet effective midnight           Diet Heart Room service  appropriate? Yes; Fluid consistency: Thin  Diet effective now                   EDUCATION NEEDS:   No education needs have been identified at this time  Skin:  Skin Assessment: Reviewed RN Assessment  Last BM:  PTA/unknown  Height:   Ht Readings from Last 1 Encounters:  01/30/22 5\' 8"  (1.727 m)    Weight:   Wt Readings from Last 1 Encounters:  01/30/22 61.3 kg     BMI:  Body mass index is 20.55 kg/m.  Estimated Nutritional Needs:  Kcal:  1700-1900 kcal Protein:  85-100 grams Fluid:  >/= 1.8 L/day     Jarome Matin, MS, RD, LDN, CNSC Clinical Dietitian PRN/Relief staff On-call/weekend pager # available in [facility]

## 2022-01-31 NOTE — Progress Notes (Signed)
Jason Mcpherson UJW:[id number] DOB: 1940/07/26 DOA: 01/30/2022 PCP: Doreatha Lew, MD   Subj: Jason Mcpherson is a 81 y.o.WM PMHx  HTN, CKD stage 3a, DM type II without complications, HLD.  Anemia,  Presenting with right hip pain after fall. History per family at bedside. He was walking through the house 2 days ago when he had an unwitnessed fall. He was walking without his walker at the time. He didn't pass out. Family was able to help him up shortly after his fall. He didn't complain of any chest pain or palpitations. Later that evening, he was able to stand on his own without any problems. However, yesterday, he began having pain and difficulty mobilizing with his walker. This morning, he was unable to walk d/t pain. His family became concerned and called for EMS. They deny any other aggravating or alleviating factors.    Obj: A/O x4, extremely hard of hearing.  Understands that there was a mixup as to him excepting blood this a.m. when I ordered blood.  States if he needs blood in order to have surgery give it to him.   Objective: VITAL SIGNS: Temp: 98.5 F (36.9 C) (10/01 0532) Temp Source: Oral (10/01 0532) BP: 105/61 (10/01 0532) Pulse Rate: 83 (10/01 0532) SPO2; FIO2:   Intake/Output Summary (Last 24 hours) at 01/31/2022 0847 Last data filed at 01/31/2022 0700 Gross per 24 hour  Intake 240 ml  Output 1100 ml  Net -860 ml     Exam: General: A/O x4 No acute respiratory distress Lungs: Clear to auscultation bilaterally without wheezes or crackles Cardiovascular: Regular rate and rhythm without murmur gallop or rub normal S1 and S2 Abdomen: Nontender, nondistended, soft, bowel sounds positive, no rebound, no ascites, no appreciable mass Extremities: No significant cyanosis, clubbing, or edema bilateral lower extremities.  Currently not complaining of RIGHT hip pain Skin: Negative rashes, lesions, ulcers Psychiatric:  Negative depression, negative anxiety, negative  fatigue, negative mania  Central nervous system:  Cranial nerves II through XII intact, negative dysarthria, negative expressive aphasia, negative receptive aphasia.    Mobility Assessment (last 72 hours)     Mobility Assessment     Row Name 01/30/22 2100 01/30/22 1500         Does patient have an order for bedrest or is patient medically unstable Yes- Bedfast (Level 1) - Complete Yes- Bedfast (Level 1) - Complete  fracture precautions                 DVT prophylaxis: Subcu heparin Code Status: DNR Family Communication:  Status is: Inpatient    Dispo: The patient is from: Home              Anticipated d/c is to: Home              Anticipated d/c date is: 3 days              Patient currently is not medically stable to d/c.    Procedures/Significant Events:    Consultants:  Orthopedic surgery   Cultures   Antimicrobials:   A/P  Right Hip Fracture Fall     - admit to inpt, med-surg     - Ortho consulted (Dr. Cyndie Chime); appreciate assistance     - NPOpMN     - pain control     - PT/OT after procedure -10/1 scheduled for surgery in A.m.   HLD     - continue home regimen   DM type II controlled without  complication - 4/82 hemoglobin A1c= 6.7 - Sensitive SSI       Essential HTN -Amlodipine 5 mg daily - Spironolactone 12.5 mg daily   CKD stage 3a     - labs pending Lab Results  Component Value Date   CREATININE 2.54 (H) 01/31/2022   CREATININE 2.16 (H) 01/30/2022   CREATININE 2.67 (H) 08/10/2021   CREATININE 1.54 (H) 07/08/2020   CREATININE 1.44 05/13/2020     Normocytic anemia -Anemia panel pending - 10/1 occult blood pending - 10/1 transfuse for hemoglobin<7 - 10/1 transfuse 2 unit PRBC        Care during the described time interval was provided by me .  I have reviewed this patient's available data, including medical history, events of note, physical examination, and all test results as part of my evaluation.

## 2022-01-31 NOTE — Plan of Care (Signed)

## 2022-01-31 NOTE — Progress Notes (Signed)
Date and time results received: 01/31/22 at 0431  Test: Hemoglobin Critical Value: 6.6  Name of Provider Notified: Primary nurse notified Otila Back, RN). Primary nurse to notify on-call provider  Orders Received? Or Actions Taken?: Primary RN to update if needed after on-call provider notification.

## 2022-01-31 NOTE — Progress Notes (Signed)
Jason Mcpherson  MRN: [id number] DOB/Age: 1941-01-25 81 y.o. [facility] Procedure: planned but on hold     Subjective: Pt and family present and we discussed blood transfusion which he agrees to, surgery cancelled because of anemia  Vital Signs Temp:  [97.7 F (36.5 C)-98.7 F (37.1 C)] 98.7 F (37.1 C) (10/01 0901) Pulse Rate:  [74-90] 84 (10/01 0901) Resp:  [10-18] 16 (10/01 0901) BP: (105-137)/(59-75) 112/63 (10/01 0901) SpO2:  [97 %-100 %] 98 % (10/01 0901) Weight:  [61.3 kg] 61.3 kg (09/30 1546)  Lab Results Recent Labs    01/30/22 1054 01/31/22 0414 01/31/22 0612  WBC 13.5* 12.3*  --   HGB 7.2* 6.6* 6.5*  HCT 23.1* 21.4* 22.0*  PLT 248 215  --    BMET Recent Labs    01/30/22 1054 01/31/22 0414  NA 138 140  K 4.1 4.4  CL 112* 115*  CO2 19* 18*  GLUCOSE 204* 182*  BUN 77* 79*  CREATININE 2.16* 2.54*  CALCIUM 8.6* 8.5*   No results found for: "INR"   Exam Comfortable but pale, pt alert and oriented        Plan Family and pt desire transfusion so they may proceed with surgery I have re ordered Allow diet today and npo after midnight  [facility] PA-C  01/31/2022, 9:20 AM Contact # [phone]

## 2022-01-31 NOTE — Progress Notes (Signed)
Notified Dr. Dia Crawford, MD about Hemoglobin of 6.5. Patient is also asking for something to eat. Paged Dr. Dia Crawford, MD about diet orders. Informed patient and family that further orders are being waited on. Will continue to monitor patient.

## 2022-01-31 NOTE — Anesthesia Preprocedure Evaluation (Deleted)
Anesthesia Evaluation    Reviewed: Allergy & Precautions, Patient's Chart, lab work & pertinent test results  Airway        Dental   Pulmonary neg pulmonary ROS, former smoker,           Cardiovascular hypertension, Pt. on medications      Neuro/Psych negative neurological ROS  negative psych ROS   GI/Hepatic negative GI ROS, Neg liver ROS,   Endo/Other  diabetes, Type 2, Oral Hypoglycemic Agents  Renal/GU Renal InsufficiencyRenal disease (AKI)     Musculoskeletal  (+) Arthritis , RIGHT HIP FRACTURE   Abdominal   Peds  Hematology  (+) Blood dyscrasia, anemia ,   Anesthesia Other Findings Day of surgery medications reviewed with the patient.  Reproductive/Obstetrics                             Anesthesia Physical Anesthesia Plan  ASA: 3  Anesthesia Plan: General   Post-op Pain Management: Tylenol PO (pre-op)*   Induction: Intravenous  PONV Risk Score and Plan: 2 and Dexamethasone and Ondansetron  Airway Management Planned: Oral ETT  Additional Equipment:   Intra-op Plan:   Post-operative Plan: Extubation in OR  Informed Consent:   Plan Discussed with:   Anesthesia Plan Comments:         Anesthesia Quick Evaluation

## 2022-01-31 NOTE — TOC Initial Note (Addendum)
Transition of Care [facility]) - Initial/Assessment Note    Patient Details  Name: Jason Mcpherson MRN: [id number] Date of Birth: Nov 03, 1940  Transition of Care [facility]) CM/SW Contact:    Henrietta Dine, RN Phone Number: 01/31/2022, 2:50 PM  Clinical Narrative: met with pt in room; pt from home with spouse; his plan is to return at discharge; POC verifed ([place], wife, [phone] has a PCP; the pt his HOH and has a hearing aid for his right ear; however he says he does not wear it because the batteries run out; he has upper and lower dentures; he also has a walker at home; awaiting PT/OT eval; TOC will continue to follow pt.                  Expected Discharge Plan: Home/Self Care Barriers to Discharge: Continued Medical Work up   Patient Goals and CMS Choice Patient states their goals for this hospitalization and ongoing recovery are:: home with family      Expected Discharge Plan and Services Expected Discharge Plan: Home/Self Care   Discharge Planning Services: CM Consult   Living arrangements for the past 2 months: Single Family Home                                      Prior Living Arrangements/Services Living arrangements for the past 2 months: Single Family Home Lives with:: Spouse Patient language and need for interpreter reviewed:: Yes Do you feel safe going back to the place where you live?: Yes      Need for Family Participation in Patient Care: Yes (Comment) Care giver support system in place?: Yes (comment)   Criminal Activity/Legal Involvement Pertinent to Current Situation/Hospitalization: No - Comment as needed  Activities of Daily Living Home Assistive Devices/Equipment: Other (Comment) (walker, ramp,) ADL Screening (condition at time of admission) Patient's cognitive ability adequate to safely complete daily activities?: No (occ confusion) Is the patient deaf or have difficulty hearing?: Yes Does the patient have difficulty seeing, even  when wearing glasses/contacts?: No Does the patient have difficulty concentrating, remembering, or making decisions?: Yes Patient able to express need for assistance with ADLs?: Yes Does the patient have difficulty dressing or bathing?: Yes Independently performs ADLs?: Yes (appropriate for developmental age) Does the patient have difficulty walking or climbing stairs?: Yes Weakness of Legs: Both Weakness of Arms/Hands: Both  Permission Sought/Granted Permission sought to share information with : Case Manager Permission granted to share information with : Yes, Verbal Permission Granted  Share Information with NAME: Lenor Coffin, RN, CM           Emotional Assessment Appearance:: Appears stated age Attitude/Demeanor/Rapport: Gracious Affect (typically observed): Accepting Orientation: : Oriented to Self, Oriented to Place, Oriented to  Time, Oriented to Situation Alcohol / Substance Use: Not Applicable Psych Involvement: No (comment)  Admission diagnosis:  Closed right hip fracture ([place]) [S72.001A] Patient Active Problem List   Diagnosis Date Noted   Closed right hip fracture ([place]) 01/30/2022   Normocytic anemia 01/30/2022   Stage 3a chronic kidney disease (CKD) ([place]) 01/30/2022   Fall at home, initial encounter 01/30/2022   Pure hypercholesterolemia 12/16/2014   Type II or unspecified type diabetes mellitus without mention of complication, uncontrolled 10/12/2013   Essential hypertension, benign 10/12/2013   Bacteremia 08/25/2013   Cellulitis 08/17/2013   DM2 (diabetes mellitus, type 2) ([place]) 08/17/2013   HTN (hypertension) 08/17/2013   Dehydration  08/17/2013   Nausea with vomiting 08/17/2013   Hyperkalemia 08/17/2013   Spinal stenosis in cervical region 10/17/2012   Pain in joint involving lower leg 10/07/2012   PCP:  Doreatha Lew, MD Pharmacy:   [id number] [place], [place] - [id number] [id number]:  [phone]: [phone]    Social Determinants of Health ([place] Interventions    Readmission Risk Interventions    01/31/2022    2:47 PM  Readmission Risk Prevention Plan  Transportation Screening Complete  PCP or Specialist Appt within 5-7 Days Complete  Home Care Screening Complete  Medication Review (RN CM) Complete

## 2022-02-01 ENCOUNTER — Encounter (HOSPITAL_COMMUNITY): Payer: Self-pay | Admitting: Internal Medicine

## 2022-02-01 ENCOUNTER — Inpatient Hospital Stay (HOSPITAL_COMMUNITY): Payer: Medicare HMO | Admitting: Anesthesiology

## 2022-02-01 ENCOUNTER — Inpatient Hospital Stay (HOSPITAL_COMMUNITY): Payer: Medicare HMO

## 2022-02-01 ENCOUNTER — Encounter (HOSPITAL_COMMUNITY)
Admission: EM | Disposition: A | Discharge status: Skilled Nursing Facility | Payer: Self-pay | Source: Home / Self Care | Attending: Internal Medicine

## 2022-02-01 ENCOUNTER — Other Ambulatory Visit: Payer: Self-pay

## 2022-02-01 DIAGNOSIS — N1831 Chronic kidney disease, stage 3a: Secondary | ICD-10-CM | POA: Diagnosis not present

## 2022-02-01 DIAGNOSIS — Z7984 Long term (current) use of oral hypoglycemic drugs: Secondary | ICD-10-CM

## 2022-02-01 DIAGNOSIS — E119 Type 2 diabetes mellitus without complications: Secondary | ICD-10-CM | POA: Diagnosis not present

## 2022-02-01 DIAGNOSIS — E1122 Type 2 diabetes mellitus with diabetic chronic kidney disease: Secondary | ICD-10-CM | POA: Diagnosis not present

## 2022-02-01 DIAGNOSIS — I10 Essential (primary) hypertension: Secondary | ICD-10-CM | POA: Diagnosis not present

## 2022-02-01 DIAGNOSIS — I129 Hypertensive chronic kidney disease with stage 1 through stage 4 chronic kidney disease, or unspecified chronic kidney disease: Secondary | ICD-10-CM | POA: Diagnosis not present

## 2022-02-01 DIAGNOSIS — W19XXXA Unspecified fall, initial encounter: Secondary | ICD-10-CM | POA: Diagnosis not present

## 2022-02-01 DIAGNOSIS — D631 Anemia in chronic kidney disease: Secondary | ICD-10-CM

## 2022-02-01 DIAGNOSIS — S72141A Displaced intertrochanteric fracture of right femur, initial encounter for closed fracture: Secondary | ICD-10-CM | POA: Diagnosis not present

## 2022-02-01 HISTORY — PX: FEMUR IM NAIL: SHX1597

## 2022-02-01 LAB — CBC WITH DIFFERENTIAL/PLATELET
Abs Immature Granulocytes: 0.08 10*3/uL — ABNORMAL HIGH (ref 0.00–0.07)
Basophils Absolute: 0 10*3/uL (ref 0.0–0.1)
Basophils Relative: 0 %
Eosinophils Absolute: 0 10*3/uL (ref 0.0–0.5)
Eosinophils Relative: 0 %
HCT: 27.7 % — ABNORMAL LOW (ref 39.0–52.0)
Hemoglobin: 8.7 g/dL — ABNORMAL LOW (ref 13.0–17.0)
Immature Granulocytes: 1 %
Lymphocytes Relative: 3 %
Lymphs Abs: 0.4 10*3/uL — ABNORMAL LOW (ref 0.7–4.0)
MCH: 29 pg (ref 26.0–34.0)
MCHC: 31.4 g/dL (ref 30.0–36.0)
MCV: 92.3 fL (ref 80.0–100.0)
Monocytes Absolute: 0.7 10*3/uL (ref 0.1–1.0)
Monocytes Relative: 5 %
Neutro Abs: 12.1 10*3/uL — ABNORMAL HIGH (ref 1.7–7.7)
Neutrophils Relative %: 91 %
Platelets: 241 10*3/uL (ref 150–400)
RBC: 3 MIL/uL — ABNORMAL LOW (ref 4.22–5.81)
RDW: 15.2 % (ref 11.5–15.5)
WBC: 13.3 10*3/uL — ABNORMAL HIGH (ref 4.0–10.5)
nRBC: 0 % (ref 0.0–0.2)

## 2022-02-01 LAB — COMPREHENSIVE METABOLIC PANEL
ALT: 13 U/L (ref 0–44)
AST: 12 U/L — ABNORMAL LOW (ref 15–41)
Albumin: 2.6 g/dL — ABNORMAL LOW (ref 3.5–5.0)
Alkaline Phosphatase: 48 U/L (ref 38–126)
Anion gap: 6 (ref 5–15)
BUN: 87 mg/dL — ABNORMAL HIGH (ref 8–23)
CO2: 18 mmol/L — ABNORMAL LOW (ref 22–32)
Calcium: 8.6 mg/dL — ABNORMAL LOW (ref 8.9–10.3)
Chloride: 113 mmol/L — ABNORMAL HIGH (ref 98–111)
Creatinine, Ser: 2.42 mg/dL — ABNORMAL HIGH (ref 0.61–1.24)
GFR, Estimated: 26 mL/min — ABNORMAL LOW (ref >60)
Glucose, Bld: 365 mg/dL — ABNORMAL HIGH (ref 70–99)
Potassium: 4.6 mmol/L (ref 3.5–5.1)
Sodium: 137 mmol/L (ref 135–145)
Total Bilirubin: 0.7 mg/dL (ref 0.3–1.2)
Total Protein: 6.3 g/dL — ABNORMAL LOW (ref 6.5–8.1)

## 2022-02-01 LAB — TYPE AND SCREEN
ABO/RH(D): O POS
Antibody Screen: NEGATIVE
Unit division: 0
Unit division: 0

## 2022-02-01 LAB — GLUCOSE, CAPILLARY
Glucose-Capillary: 317 mg/dL — ABNORMAL HIGH (ref 70–99)
Glucose-Capillary: 287 mg/dL — ABNORMAL HIGH (ref 70–99)
Glucose-Capillary: 181 mg/dL — ABNORMAL HIGH (ref 70–99)
Glucose-Capillary: 172 mg/dL — ABNORMAL HIGH (ref 70–99)
Glucose-Capillary: 336 mg/dL — ABNORMAL HIGH (ref 70–99)

## 2022-02-01 LAB — BPAM RBC
Blood Product Expiration Date: 202311022359
Blood Product Expiration Date: 202311032359
ISSUE DATE / TIME: 202310011429
ISSUE DATE / TIME: 202310011732
Unit Type and Rh: 5100
Unit Type and Rh: 5100

## 2022-02-01 LAB — OCCULT BLOOD X 1 CARD TO LAB, STOOL: Fecal Occult Bld: NEGATIVE

## 2022-02-01 LAB — PHOSPHORUS: Phosphorus: 3.6 mg/dL (ref 2.5–4.6)

## 2022-02-01 LAB — MAGNESIUM: Magnesium: 2.3 mg/dL (ref 1.7–2.4)

## 2022-02-01 SURGERY — INSERTION, INTRAMEDULLARY ROD, FEMUR
Anesthesia: General | Site: Hip | Laterality: Right

## 2022-02-01 MED ORDER — ONDANSETRON HCL 4 MG PO TABS: 4.0000 mg | ORAL_TABLET | Freq: Four times a day (QID) | ORAL | Status: DC | PRN

## 2022-02-01 MED ORDER — PHENYLEPHRINE 80 MCG/ML (10ML) SYRINGE FOR IV PUSH (FOR BLOOD PRESSURE SUPPORT)
PREFILLED_SYRINGE | INTRAVENOUS | Status: DC | PRN
  Administered 2022-02-01: 120 ug via INTRAVENOUS

## 2022-02-01 MED ORDER — SUGAMMADEX SODIUM 200 MG/2ML IV SOLN
INTRAVENOUS | Status: DC | PRN
  Administered 2022-02-01: 200 mg via INTRAVENOUS

## 2022-02-01 MED ORDER — HYDROCODONE-ACETAMINOPHEN 7.5-325 MG PO TABS
1.0000 | ORAL_TABLET | ORAL | Status: DC | PRN
  Administered 2022-02-06 – 2022-02-07 (×4): 1 via ORAL
  Filled 2022-02-01 (×4): qty 1

## 2022-02-01 MED ORDER — ASPIRIN 81 MG PO TBEC: 81.0000 mg | DELAYED_RELEASE_TABLET | Freq: Two times a day (BID) | ORAL | Status: DC

## 2022-02-01 MED ORDER — FENTANYL CITRATE (PF) 100 MCG/2ML IJ SOLN
INTRAMUSCULAR | Status: DC | PRN
  Administered 2022-02-01 (×2): 50 ug via INTRAVENOUS

## 2022-02-01 MED ORDER — METHOCARBAMOL 500 MG IVPB - SIMPLE MED
INTRAVENOUS | Status: AC
  Filled 2022-02-01: qty 55

## 2022-02-01 MED ORDER — ACETAMINOPHEN 160 MG/5ML PO SOLN
ORAL | Status: AC
  Filled 2022-02-01: qty 20.3

## 2022-02-01 MED ORDER — ONDANSETRON HCL 4 MG/2ML IJ SOLN
4.0000 mg | Freq: Four times a day (QID) | INTRAMUSCULAR | Status: DC | PRN
  Administered 2022-02-04 – 2022-02-05 (×2): 4 mg via INTRAVENOUS
  Filled 2022-02-01 (×2): qty 2

## 2022-02-01 MED ORDER — ROCURONIUM BROMIDE 10 MG/ML (PF) SYRINGE
PREFILLED_SYRINGE | INTRAVENOUS | Status: DC | PRN
  Administered 2022-02-01: 40 mg via INTRAVENOUS

## 2022-02-01 MED ORDER — DEXAMETHASONE SODIUM PHOSPHATE 10 MG/ML IJ SOLN
INTRAMUSCULAR | Status: DC | PRN
  Administered 2022-02-01: 4 mg via INTRAVENOUS

## 2022-02-01 MED ORDER — SODIUM CHLORIDE 0.9 % IV SOLN: INTRAVENOUS | Status: DC

## 2022-02-01 MED ORDER — CHLORHEXIDINE GLUCONATE 0.12 % MT SOLN
15.0000 mL | Freq: Once | OROMUCOSAL | Status: AC
  Administered 2022-02-01: 15 mL via OROMUCOSAL

## 2022-02-01 MED ORDER — METHOCARBAMOL 500 MG IVPB - SIMPLE MED
500.0000 mg | Freq: Four times a day (QID) | INTRAVENOUS | Status: DC | PRN
  Administered 2022-02-01: 500 mg via INTRAVENOUS

## 2022-02-01 MED ORDER — PHENOL 1.4 % MT LIQD: 1.0000 | OROMUCOSAL | Status: DC | PRN

## 2022-02-01 MED ORDER — DEXAMETHASONE SODIUM PHOSPHATE 10 MG/ML IJ SOLN
10.0000 mg | Freq: Once | INTRAMUSCULAR | Status: AC
  Administered 2022-02-02: 10 mg via INTRAVENOUS
  Filled 2022-02-01: qty 1

## 2022-02-01 MED ORDER — TRANEXAMIC ACID-NACL 1000-0.7 MG/100ML-% IV SOLN: 1000.0000 mg | Freq: Once | INTRAVENOUS | Status: DC

## 2022-02-01 MED ORDER — ASPIRIN 81 MG PO CHEW
81.0000 mg | CHEWABLE_TABLET | Freq: Two times a day (BID) | ORAL | Status: DC
  Administered 2022-02-01 – 2022-02-05 (×8): 81 mg via ORAL
  Filled 2022-02-01 (×8): qty 1

## 2022-02-01 MED ORDER — MENTHOL 3 MG MT LOZG: 1.0000 | LOZENGE | OROMUCOSAL | Status: DC | PRN

## 2022-02-01 MED ORDER — METOCLOPRAMIDE HCL 5 MG PO TABS: 5.0000 mg | ORAL_TABLET | Freq: Three times a day (TID) | ORAL | Status: DC | PRN

## 2022-02-01 MED ORDER — DEXAMETHASONE SODIUM PHOSPHATE 10 MG/ML IJ SOLN
INTRAMUSCULAR | Status: AC
  Filled 2022-02-01: qty 1

## 2022-02-01 MED ORDER — ORAL CARE MOUTH RINSE: 15.0000 mL | Freq: Once | OROMUCOSAL | Status: AC

## 2022-02-01 MED ORDER — MEPERIDINE HCL 50 MG/ML IJ SOLN: 6.2500 mg | INTRAMUSCULAR | Status: DC | PRN

## 2022-02-01 MED ORDER — FENTANYL CITRATE PF 50 MCG/ML IJ SOSY
PREFILLED_SYRINGE | INTRAMUSCULAR | Status: AC
  Filled 2022-02-01: qty 1

## 2022-02-01 MED ORDER — PHENYLEPHRINE 80 MCG/ML (10ML) SYRINGE FOR IV PUSH (FOR BLOOD PRESSURE SUPPORT)
PREFILLED_SYRINGE | INTRAVENOUS | Status: AC
  Filled 2022-02-01: qty 10

## 2022-02-01 MED ORDER — ONDANSETRON HCL 4 MG/2ML IJ SOLN
INTRAMUSCULAR | Status: DC | PRN
  Administered 2022-02-01: 4 mg via INTRAVENOUS

## 2022-02-01 MED ORDER — DIPHENHYDRAMINE HCL 12.5 MG/5ML PO ELIX: 12.5000 mg | ORAL_SOLUTION | ORAL | Status: DC | PRN

## 2022-02-01 MED ORDER — ONDANSETRON HCL 4 MG/2ML IJ SOLN: 4.0000 mg | Freq: Once | INTRAMUSCULAR | Status: DC | PRN

## 2022-02-01 MED ORDER — TRANEXAMIC ACID-NACL 1000-0.7 MG/100ML-% IV SOLN
INTRAVENOUS | Status: DC | PRN
  Administered 2022-02-01: 1000 mg via INTRAVENOUS

## 2022-02-01 MED ORDER — DOCUSATE SODIUM 100 MG PO CAPS: 100.0000 mg | ORAL_CAPSULE | Freq: Two times a day (BID) | ORAL | Status: DC

## 2022-02-01 MED ORDER — BISACODYL 10 MG RE SUPP: 10.0000 mg | Freq: Every day | RECTAL | Status: DC | PRN

## 2022-02-01 MED ORDER — OXYCODONE HCL 5 MG/5ML PO SOLN: 5.0000 mg | Freq: Once | ORAL | Status: AC | PRN

## 2022-02-01 MED ORDER — ONDANSETRON HCL 4 MG/2ML IJ SOLN
INTRAMUSCULAR | Status: AC
  Filled 2022-02-01: qty 2

## 2022-02-01 MED ORDER — LIDOCAINE HCL (PF) 2 % IJ SOLN
INTRAMUSCULAR | Status: AC
  Filled 2022-02-01: qty 5

## 2022-02-01 MED ORDER — OXYCODONE HCL 5 MG PO TABS
ORAL_TABLET | ORAL | Status: AC
  Filled 2022-02-01: qty 1

## 2022-02-01 MED ORDER — FENTANYL CITRATE PF 50 MCG/ML IJ SOSY
25.0000 ug | PREFILLED_SYRINGE | INTRAMUSCULAR | Status: DC | PRN
  Administered 2022-02-01 (×2): 50 ug via INTRAVENOUS

## 2022-02-01 MED ORDER — POLYETHYLENE GLYCOL 3350 17 G PO PACK
17.0000 g | PACK | Freq: Every day | ORAL | Status: DC
  Administered 2022-02-01 – 2022-02-08 (×7): 17 g via ORAL
  Filled 2022-02-01 (×8): qty 1

## 2022-02-01 MED ORDER — MORPHINE SULFATE (PF) 2 MG/ML IV SOLN: 0.5000 mg | INTRAVENOUS | Status: DC | PRN

## 2022-02-01 MED ORDER — TRANEXAMIC ACID-NACL 1000-0.7 MG/100ML-% IV SOLN
1000.0000 mg | Freq: Once | INTRAVENOUS | Status: AC
  Administered 2022-02-01: 1000 mg via INTRAVENOUS
  Filled 2022-02-01: qty 100

## 2022-02-01 MED ORDER — METHOCARBAMOL 500 MG PO TABS
500.0000 mg | ORAL_TABLET | Freq: Four times a day (QID) | ORAL | Status: DC | PRN
  Administered 2022-02-06: 500 mg via ORAL
  Filled 2022-02-01: qty 1

## 2022-02-01 MED ORDER — CEFAZOLIN SODIUM-DEXTROSE 2-4 GM/100ML-% IV SOLN
2.0000 g | INTRAVENOUS | Status: AC
  Administered 2022-02-01: 2 g via INTRAVENOUS
  Filled 2022-02-01: qty 100

## 2022-02-01 MED ORDER — POVIDONE-IODINE 10 % EX SWAB
2.0000 | Freq: Once | CUTANEOUS | Status: AC
  Administered 2022-02-01: 2 via TOPICAL

## 2022-02-01 MED ORDER — METOCLOPRAMIDE HCL 5 MG/ML IJ SOLN
5.0000 mg | Freq: Three times a day (TID) | INTRAMUSCULAR | Status: DC | PRN
  Administered 2022-02-04 – 2022-02-05 (×2): 10 mg via INTRAVENOUS
  Filled 2022-02-01 (×2): qty 2

## 2022-02-01 MED ORDER — INSULIN DETEMIR 100 UNIT/ML ~~LOC~~ SOLN
5.0000 [IU] | Freq: Every day | SUBCUTANEOUS | Status: DC
  Administered 2022-02-02: 5 [IU] via SUBCUTANEOUS
  Filled 2022-02-01 (×2): qty 0.05

## 2022-02-01 MED ORDER — HYDROCODONE-ACETAMINOPHEN 5-325 MG PO TABS: 1.0000 | ORAL_TABLET | ORAL | Status: DC | PRN

## 2022-02-01 MED ORDER — ROCURONIUM BROMIDE 10 MG/ML (PF) SYRINGE
PREFILLED_SYRINGE | INTRAVENOUS | Status: AC
  Filled 2022-02-01: qty 10

## 2022-02-01 MED ORDER — ACETAMINOPHEN 325 MG PO TABS: 325.0000 mg | ORAL_TABLET | Freq: Four times a day (QID) | ORAL | Status: DC | PRN

## 2022-02-01 MED ORDER — OXYCODONE HCL 5 MG PO TABS
5.0000 mg | ORAL_TABLET | Freq: Once | ORAL | Status: AC | PRN
  Administered 2022-02-01: 5 mg via ORAL

## 2022-02-01 MED ORDER — LIDOCAINE 2% (20 MG/ML) 5 ML SYRINGE
INTRAMUSCULAR | Status: DC | PRN
  Administered 2022-02-01: 100 mg via INTRAVENOUS

## 2022-02-01 MED ORDER — METOCLOPRAMIDE HCL 5 MG/ML IJ SOLN: 5.0000 mg | Freq: Three times a day (TID) | INTRAMUSCULAR | Status: DC | PRN

## 2022-02-01 MED ORDER — INSULIN ASPART 100 UNIT/ML IJ SOLN
0.0000 [IU] | INTRAMUSCULAR | Status: DC
  Administered 2022-02-01: 11 [IU] via SUBCUTANEOUS
  Administered 2022-02-02: 3 [IU] via SUBCUTANEOUS
  Administered 2022-02-02: 15 [IU] via SUBCUTANEOUS
  Administered 2022-02-02: 11 [IU] via SUBCUTANEOUS
  Administered 2022-02-02: 8 [IU] via SUBCUTANEOUS
  Administered 2022-02-02: 3 [IU] via SUBCUTANEOUS
  Administered 2022-02-02: 15 [IU] via SUBCUTANEOUS
  Administered 2022-02-03: 11 [IU] via SUBCUTANEOUS
  Administered 2022-02-03 (×2): 5 [IU] via SUBCUTANEOUS
  Administered 2022-02-03: 8 [IU] via SUBCUTANEOUS
  Administered 2022-02-03 – 2022-02-04 (×2): 3 [IU] via SUBCUTANEOUS
  Administered 2022-02-04: 5 [IU] via SUBCUTANEOUS
  Administered 2022-02-04: 8 [IU] via SUBCUTANEOUS
  Administered 2022-02-04: 3 [IU] via SUBCUTANEOUS
  Administered 2022-02-04 – 2022-02-05 (×2): 2 [IU] via SUBCUTANEOUS
  Administered 2022-02-05: 3 [IU] via SUBCUTANEOUS
  Administered 2022-02-05: 5 [IU] via SUBCUTANEOUS
  Administered 2022-02-05 (×2): 2 [IU] via SUBCUTANEOUS
  Administered 2022-02-06: 3 [IU] via SUBCUTANEOUS
  Administered 2022-02-06: 2 [IU] via SUBCUTANEOUS
  Administered 2022-02-06: 3 [IU] via SUBCUTANEOUS
  Administered 2022-02-06: 2 [IU] via SUBCUTANEOUS
  Administered 2022-02-07 (×4): 3 [IU] via SUBCUTANEOUS
  Administered 2022-02-08: 2 [IU] via SUBCUTANEOUS

## 2022-02-01 MED ORDER — ONDANSETRON HCL 4 MG/2ML IJ SOLN: 4.0000 mg | Freq: Four times a day (QID) | INTRAMUSCULAR | Status: DC | PRN

## 2022-02-01 MED ORDER — LACTATED RINGERS IV SOLN: INTRAVENOUS | Status: DC

## 2022-02-01 MED ORDER — TRANEXAMIC ACID-NACL 1000-0.7 MG/100ML-% IV SOLN
INTRAVENOUS | Status: AC
  Filled 2022-02-01: qty 100

## 2022-02-01 MED ORDER — PROPOFOL 10 MG/ML IV BOLUS
INTRAVENOUS | Status: DC | PRN
  Administered 2022-02-01: 60 mg via INTRAVENOUS

## 2022-02-01 MED ORDER — HYDROCODONE-ACETAMINOPHEN 5-325 MG PO TABS
1.0000 | ORAL_TABLET | ORAL | Status: DC | PRN
  Administered 2022-02-01 – 2022-02-06 (×3): 1 via ORAL
  Administered 2022-02-08: 2 via ORAL
  Filled 2022-02-01: qty 2
  Filled 2022-02-01 (×3): qty 1

## 2022-02-01 MED ORDER — DOCUSATE SODIUM 100 MG PO CAPS
100.0000 mg | ORAL_CAPSULE | Freq: Two times a day (BID) | ORAL | Status: DC
  Administered 2022-02-01 – 2022-02-08 (×14): 100 mg via ORAL
  Filled 2022-02-01 (×14): qty 1

## 2022-02-01 MED ORDER — ACETAMINOPHEN 160 MG/5ML PO SOLN
325.0000 mg | ORAL | Status: DC | PRN
  Administered 2022-02-01: 650 mg via ORAL

## 2022-02-01 MED ORDER — FENTANYL CITRATE (PF) 100 MCG/2ML IJ SOLN
INTRAMUSCULAR | Status: AC
  Filled 2022-02-01: qty 2

## 2022-02-01 MED ORDER — POLYETHYLENE GLYCOL 3350 17 G PO PACK: 17.0000 g | PACK | Freq: Every day | ORAL | Status: DC | PRN

## 2022-02-01 MED ORDER — ACETAMINOPHEN 325 MG PO TABS: 325.0000 mg | ORAL_TABLET | ORAL | Status: DC | PRN

## 2022-02-01 MED ORDER — CEFAZOLIN SODIUM-DEXTROSE 2-4 GM/100ML-% IV SOLN
2.0000 g | Freq: Four times a day (QID) | INTRAVENOUS | Status: AC
  Administered 2022-02-01 – 2022-02-02 (×2): 2 g via INTRAVENOUS
  Filled 2022-02-01 (×2): qty 100

## 2022-02-01 MED ORDER — 0.9 % SODIUM CHLORIDE (POUR BTL) OPTIME
TOPICAL | Status: DC | PRN
  Administered 2022-02-01: 1000 mL

## 2022-02-01 MED ORDER — PROPOFOL 10 MG/ML IV BOLUS
INTRAVENOUS | Status: AC
  Filled 2022-02-01: qty 20

## 2022-02-01 SURGICAL SUPPLY — 39 items
ADH SKN CLS APL DERMABOND .7 (GAUZE/BANDAGES/DRESSINGS) ×1
BAG COUNTER SPONGE SURGICOUNT (BAG) IMPLANT
BAG SPEC THK2 15X12 ZIP CLS (MISCELLANEOUS)
BAG SPNG CNTER NS LX DISP (BAG)
BAG ZIPLOCK 12X15 (MISCELLANEOUS) IMPLANT
BIT DRILL CANN LG 4.3MM (BIT) IMPLANT
BIT DRILL LAG SCREW (DRILL) IMPLANT
BNDG GAUZE DERMACEA FLUFF 4 (GAUZE/BANDAGES/DRESSINGS) ×1 IMPLANT
BNDG GZE DERMACEA 4 6PLY (GAUZE/BANDAGES/DRESSINGS) ×1
COVER PERINEAL POST (MISCELLANEOUS) ×1 IMPLANT
COVER SURGICAL LIGHT HANDLE (MISCELLANEOUS) ×1 IMPLANT
DERMABOND ADVANCED .7 DNX12 (GAUZE/BANDAGES/DRESSINGS) IMPLANT
DRAPE STERI IOBAN 125X83 (DRAPES) ×1 IMPLANT
DRILL BIT CANN LG 4.3MM (BIT) ×1
DRILL LAG SCREW (DRILL) ×1
DRSG AQUACEL AG ADV 3.5X 4 (GAUZE/BANDAGES/DRESSINGS) IMPLANT
DRSG AQUACEL AG ADV 3.5X 6 (GAUZE/BANDAGES/DRESSINGS) IMPLANT
DURAPREP 26ML APPLICATOR (WOUND CARE) ×1 IMPLANT
ELECT REM PT RETURN 15FT ADLT (MISCELLANEOUS) ×1 IMPLANT
GLOVE BIO SURGEON STRL SZ 6 (GLOVE) ×1 IMPLANT
GLOVE BIOGEL PI IND STRL 6.5 (GLOVE) ×1 IMPLANT
GLOVE BIOGEL PI IND STRL 7.5 (GLOVE) ×1 IMPLANT
GLOVE ORTHO TXT STRL SZ7.5 (GLOVE) ×2 IMPLANT
GOWN STRL REUS W/ TWL LRG LVL3 (GOWN DISPOSABLE) ×3 IMPLANT
GOWN STRL REUS W/TWL LRG LVL3 (GOWN DISPOSABLE) ×3
GUIDEPIN VERSANAIL DSP 3.2X444 (ORTHOPEDIC DISPOSABLE SUPPLIES) IMPLANT
KIT BASIN OR (CUSTOM PROCEDURE TRAY) ×1 IMPLANT
KIT TURNOVER KIT A (KITS) IMPLANT
MANIFOLD NEPTUNE II (INSTRUMENTS) ×1 IMPLANT
NAIL HIP FRACT 130D 11X180 (Screw) IMPLANT
PACK GENERAL/GYN (CUSTOM PROCEDURE TRAY) ×1 IMPLANT
PROTECTOR NERVE ULNAR (MISCELLANEOUS) ×1 IMPLANT
SCREW BONE CORTICAL 5.0X38 (Screw) IMPLANT
SCREW LAG 10.5MMX105MM HFN (Screw) IMPLANT
SUT VIC AB 1 CT1 27 (SUTURE) ×1
SUT VIC AB 1 CT1 27XBRD ANTBC (SUTURE) ×1 IMPLANT
SUT VIC AB 2-0 CT1 27 (SUTURE) ×1
SUT VIC AB 2-0 CT1 TAPERPNT 27 (SUTURE) ×1 IMPLANT
TOWEL OR 17X26 10 PK STRL BLUE (TOWEL DISPOSABLE) ×1 IMPLANT

## 2022-02-01 NOTE — Interval H&P Note (Signed)
History and Physical Interval Note:  02/01/2022 3:40 PM  Jason Mcpherson  has presented today for surgery, with the diagnosis of intertrochanteric fracture right.  The various methods of treatment have been discussed with the patient and family. After consideration of risks, benefits and other options for treatment, the patient has consented to  Procedure(s): INTRAMEDULLARY (IM) NAIL FEMORAL (Right) as a surgical intervention.  The patient's history has been reviewed, patient examined, no change in status, stable for surgery.  I have reviewed the patient's chart and labs.  Questions were answered to the patient's satisfaction.     Mauri Pole

## 2022-02-01 NOTE — Progress Notes (Signed)
Jason Mcpherson VZD:[id number] DOB: [birth year] DOA: 01/30/2022 PCP: Doreatha Lew, MD   Subj: Jason Mcpherson is a 81 y.o.WM PMHx  HTN, CKD stage 3a, DM type II without complications, HLD.  Anemia,  Presenting with right hip pain after fall. History per family at bedside. He was walking through the house 2 days ago when he had an unwitnessed fall. He was walking without his walker at the time. He didn't pass out. Family was able to help him up shortly after his fall. He didn't complain of any chest pain or palpitations. Later that evening, he was able to stand on his own without any problems. However, yesterday, he began having pain and difficulty mobilizing with his walker. This morning, he was unable to walk d/t pain. His family became concerned and called for EMS. They deny any other aggravating or alleviating factors.    Obj: 10/2 afebrile overnight A/O x4, extremely hard of hearing.  Patient ready for surgery today.   Objective: VITAL SIGNS: Temp: 98.3 F (36.8 C) (10/02 0530) Temp Source: Oral (10/02 0530) BP: 149/82 (10/02 0530) Pulse Rate: 81 (10/02 0530) SPO2; FIO2:   Intake/Output Summary (Last 24 hours) at 02/01/2022 1315 Last data filed at 02/01/2022 0544 Gross per 24 hour  Intake 1589 ml  Output 850 ml  Net 739 ml      Exam: General: A/O x4 No acute respiratory distress Lungs: Clear to auscultation bilaterally without wheezes or crackles Cardiovascular: Regular rate and rhythm without murmur gallop or rub normal S1 and S2 Abdomen: Nontender, nondistended, soft, bowel sounds positive, no rebound, no ascites, no appreciable mass Extremities: No significant cyanosis, clubbing, or edema bilateral lower extremities.  Currently not complaining of RIGHT hip pain Skin: Negative rashes, lesions, ulcers Psychiatric:  Negative depression, negative anxiety, negative fatigue, negative mania  Central nervous system:  Cranial nerves II through XII intact, negative  dysarthria, negative expressive aphasia, negative receptive aphasia.    Mobility Assessment (last 72 hours)     Mobility Assessment     Row Name 02/01/22 0900 02/01/22 0752 01/31/22 2030 01/31/22 1004 01/30/22 2100   Does patient have an order for bedrest or is patient medically unstable Yes- Bedfast (Level 1) - Complete Yes- Bedfast (Level 1) - Complete Yes- Bedfast (Level 1) - Complete Yes- Bedfast (Level 1) - Complete Yes- Bedfast (Level 1) - Complete    Row Name 01/30/22 1500           Does patient have an order for bedrest or is patient medically unstable Yes- Bedfast (Level 1) - Complete  fracture precautions                  DVT prophylaxis: Subcu heparin Code Status: DNR Family Communication: 10/2 wife and son at bedside for discussion of plan of care all questions answered Status is: Inpatient    Dispo: The patient is from: Home              Anticipated d/c is to: Home              Anticipated d/c date is: 3 days              Patient currently is not medically stable to d/c.    Procedures/Significant Events:    Consultants:  Orthopedic surgery   Cultures   Antimicrobials:   A/P  Right Hip Fracture Fall     - admit to inpt, med-surg     - Ortho consulted (Dr. Cyndie Chime); appreciate assistance     -  NPOpMN     - pain control     - PT/OT after procedure -10/1 scheduled for surgery in A.m.   HLD     - continue home regimen   DM type II controlled without complication - 4/94 hemoglobin A1c= 6.7 -10/2 Levemir 5 units - 10/2 increase moderate SSI        Essential HTN -Amlodipine 5 mg daily - Spironolactone 12.5 mg daily   CKD stage 3a     - labs pending Lab Results  Component Value Date   CREATININE 2.42 (H) 02/01/2022   CREATININE 2.54 (H) 01/31/2022   CREATININE 2.16 (H) 01/30/2022   CREATININE 2.67 (H) 08/10/2021   CREATININE 1.54 (H) 07/08/2020     Normocytic anemia -Anemia panel pending - 10/1 occult blood pending - 10/1  transfuse for hemoglobin<7 - 10/1 transfuse 2 unit PRBC        Care during the described time interval was provided by me .  I have reviewed this patient's available data, including medical history, events of note, physical examination, and all test results as part of my evaluation.

## 2022-02-01 NOTE — Progress Notes (Signed)
Patient ID: Jason Mcpherson, male   DOB: Aug 08, 1940, 81 y.o.   MRN: [id number]  Right comminuted intertrochanteric hip fracture  Hgb up to 8.7  If cleared I plan to take him to the OR for ORIF this afternoon

## 2022-02-01 NOTE — Brief Op Note (Signed)
01/30/2022 - 02/01/2022  3:50 PM  PATIENT:  Jeanann Lewandowsky  81 y.o. male  PRE-OPERATIVE DIAGNOSIS:  intertrochanteric fracture right  POST-OPERATIVE DIAGNOSIS:  intertrochanteric hip fracture right  PROCEDURE:  Procedure(s): INTRAMEDULLARY (IM) NAIL FEMORAL (Right)  SURGEON:  Surgeon(s) and Role:    Paralee Cancel, MD - Primary  PHYSICIAN ASSISTANT: Costella Hatcher, PA-C  ANESTHESIA:   general  EBL:  <100 cc  BLOOD ADMINISTERED:none  DRAINS: none   LOCAL MEDICATIONS USED:  NONE  SPECIMEN:  No Specimen  DISPOSITION OF SPECIMEN:  N/A  COUNTS:  YES  TOURNIQUET:  * No tourniquets in log *  DICTATION: .Other Dictation: Dictation Number [id number]  PLAN OF CARE: Admit to inpatient   PATIENT DISPOSITION:  PACU - hemodynamically stable.   Delay start of Pharmacological VTE agent (>24hrs) due to surgical blood loss or risk of bleeding: no

## 2022-02-01 NOTE — Op Note (Signed)
NAME: Borromeo, Crandall RECORD NO: [id number] ACCOUNT NO: [id number] DATE OF BIRTH: [birth year] FACILITY: [facility]ATION: [id number] PHYSICIAN: Pietro Cassis. Alvan Dame, MD  Operative Report   DATE OF PROCEDURE: 02/01/2022   PREOPERATIVE DIAGNOSIS:  Comminuted right intertrochanteric femur fracture.  POSTOPERATIVE DIAGNOSIS:  Comminuted right intertrochanteric femur fracture.  PROCEDURE:  Closed reduction, intramedullary nailing of right proximal femur.  COMPONENTS USED:  Biomet Affixus nail, 11 x 180 mm with 130-degree lag screw measuring 105 mm and a single distal interlock.  SURGEON:  Pietro Cassis. Alvan Dame, MD  ASSISTANT:  Costella Hatcher, PA-C.  Note that Ms. Lu Duffel was present from the entire procedure from initial set up.  General facilitation of the case and primary wound closure as well as assistance with reduction.  ANESTHESIA:  General.  BLOOD LOSS:  Less than 100 mL  DRAINS:  None.  COMPLICATIONS:  None.  INDICATIONS FOR THE PROCEDURE:  The patient is an 81 year old male who presented to the emergency room on 01/30/2022.  After inability to bear weight after a fall.  Radiographs revealed a comminuted intertrochanteric femur fracture.  He was admitted to  the hospitalist service.  He was noted, we can have a hemoglobin of 6 and received transfusions.  His hemoglobin came up to 8.7.  Reviewed with family and the patient the indications for the procedure.  We discussed the risks of malunion, nonunion, and  need for future surgeries.  We also reviewed the risk of infection, DVT.  There is a chance that he may require further blood transfusion.  Consent was obtained for management of his fracture.  DESCRIPTION OF PROCEDURE:  The patient was brought to the operative theater.  Once adequate anesthesia, preoperative antibiotics, Ancef administered as well as tranexamic acid.  He was positioned supine on the Hana table.  His right lower extremity was  positioned into the traction boot.  His  left leg was flexed and abducted out of the way with all bony prominences padded, particularly over the lateral aspect of the knee.  He was safely positioned against the perineal post.  Once his upper extremity was  carefully padded and positioned.  Fluoroscopy was brought to the field.  We identified the fracture pattern with traction and external rotation of the leg.  I was able to get the best reduction of his fracture.  Given this, we then prepped and draped  the right lateral hip.  A timeout was performed identifying the patient, planned procedure, and extremity.  Fluoroscopy was brought back to the field.  An incision was made posterior and lateral to the tip of the trochanter.  Guidewire was inserted into  tip of the trochanter and confirmed radiographically.  I then opened up the proximal femur with a starting drill.  We then passed the 11 x 180 mm nail by hand to its appropriate depth.  Then, using the guidewire insertion jig, we placed a guidewire into  the center of the head in the AP and lateral planes.  I measured and selected a 105 mm lag screw.  I drilled for this and then passed the lag screw.  The bone quality was noted to be suboptimal with very little purchase.  Once I had the lag screw into  position I did take traction off and try to apply some compression and medialization of the shaft to the fracture.  I then tightened the proximal locking bolt and backed it off a quarter of a turn to allow for further compression.  Through the insertion  jig, a distal interlock was placed measuring 38 mm.  Final radiographs were obtained.  All wounds were irrigated.  The proximal wound was closed in layers with #1 Vicryl in the gluteal fascia.  The remainder of the wounds were closed in layers using 2-0  Vicryl and a running Monocryl.  All wounds were cleaned, dried and dressed sterilely using surgical glue and Aquacel dressings.  He was brought to the recovery room in stable condition, tolerating the  procedure well.  Postoperatively, based on his bone quality, I will have him be partial weightbearing despite his limited activity.  We still are going to protect this hip to make certain that there were no complications related to his bone quality.  Findings reviewed  with family.     SUJ D: 02/01/2022 5:07:04 pm T: 02/01/2022 10:47:00 pm  JOB: [id number]/ [id number]

## 2022-02-01 NOTE — Plan of Care (Signed)
  Problem: Activity: Goal: Risk for activity intolerance will decrease Outcome: Progressing   Problem: Pain Managment: Goal: General experience of comfort will improve Outcome: Progressing   Problem: Safety: Goal: Ability to remain free from injury will improve Outcome: Progressing   

## 2022-02-01 NOTE — Anesthesia Procedure Notes (Signed)
Procedure Name: Intubation Date/Time: 02/01/2022 4:04 PM  Performed by: Milford Cage, CRNAPre-anesthesia Checklist: Patient identified, Emergency Drugs available, Suction available and Patient being monitored Patient Re-evaluated:Patient Re-evaluated prior to induction Oxygen Delivery Method: Circle system utilized Preoxygenation: Pre-oxygenation with 100% oxygen Induction Type: IV induction Ventilation: Mask ventilation without difficulty Laryngoscope Size: Mac and 3 Grade View: Grade I Tube type: Oral Tube size: 7.5 mm Number of attempts: 1 Airway Equipment and Method: Stylet Placement Confirmation: ETT inserted through vocal cords under direct vision, positive ETCO2 and breath sounds checked- equal and bilateral Secured at: 22 cm Tube secured with: Tape Dental Injury: Teeth and Oropharynx as per pre-operative assessment

## 2022-02-01 NOTE — Transfer of Care (Signed)
Immediate Anesthesia Transfer of Care Note  Patient: CARSIN RANDAZZO  Procedure(s) Performed: INTRAMEDULLARY (IM) NAIL FEMORAL (Right: Hip)  Patient Location: PACU  Anesthesia Type:General  Level of Consciousness: awake  Airway & Oxygen Therapy: Patient Spontanous Breathing and Patient connected to face mask oxygen  Post-op Assessment: Report given to RN and Post -op Vital signs reviewed and stable  Post vital signs: Reviewed and stable  Last Vitals:  Vitals Value Taken Time  BP    Temp    Pulse    Resp    SpO2      Last Pain:  Vitals:   02/01/22 1525  TempSrc:   PainSc: 0-No pain      Patients Stated Pain Goal: 3 (94/70/76 1518)  Complications: No notable events documented.

## 2022-02-01 NOTE — Inpatient Diabetes Management (Signed)
Inpatient Diabetes Program Recommendations  AACE/ADA: New Consensus Statement on Inpatient Glycemic Control (2015)  Target Ranges:  Prepandial:   less than 140 mg/dL      Peak postprandial:   less than 180 mg/dL (1-2 hours)      Critically ill patients:  140 - 180 mg/dL   Lab Results  Component Value Date   GLUCAP 317 (H) 02/01/2022   HGBA1C 6.7 (H) 01/30/2022    Review of Glycemic Control  Latest Reference Range & Units 01/31/22 17:03 01/31/22 22:50 02/01/22 07:26  Glucose-Capillary 70 - 99 mg/dL 281 (H) 337 (H) 317 (H)  (H): Data is abnormally high Diabetes history: Type 2 DM Outpatient Diabetes medications: Amaryl 1.5 mg QD, Farxiga 5 mg QD Current orders for Inpatient glycemic control: Novolog 0-9 units TID  Inpatient Diabetes Program Recommendations:    Consider adding Novolog 0-5 units QHS and Semglee 8 units QHS.   Thanks, Bronson Curb, MSN, RNC-OB Diabetes Coordinator [phone] (8a-5p)

## 2022-02-01 NOTE — Discharge Instructions (Addendum)
INSTRUCTIONS AFTER SURGERY  Remove items at home which could result in a fall. This includes throw rugs or furniture in walking pathways ICE to the affected joint every three hours while awake for 30 minutes at a time, for at least the first 3-5 days, and then as needed for pain and swelling.  Continue to use ice for pain and swelling. You may notice swelling that will progress down to the foot and ankle.  This is normal after surgery.  Elevate your leg when you are not up walking on it.   Continue to use the breathing machine you got in the hospital (incentive spirometer) which will help keep your temperature down.  It is common for your temperature to cycle up and down following surgery, especially at night when you are not up moving around and exerting yourself.  The breathing machine keeps your lungs expanded and your temperature down.   DIET:  As you were doing prior to hospitalization, we recommend a well-balanced diet.  DRESSING / WOUND CARE / SHOWERING  Keep the surgical dressing until follow up.  The dressing is water proof, so you can shower without any extra covering.  IF THE DRESSING FALLS OFF or the wound gets wet inside, change the dressing with sterile gauze.  Please use good hand washing techniques before changing the dressing.  Do not use any lotions or creams on the incision until instructed by your surgeon.    ACTIVITY  Increase activity slowly as tolerated, but follow the weight bearing instructions below.   No driving for 6 weeks or until further direction given by your physician.  You cannot drive while taking narcotics.  No lifting or carrying greater than 10 lbs. until further directed by your surgeon. Avoid periods of inactivity such as sitting longer than an hour when not asleep. This helps prevent blood clots.  You may return to work once you are authorized by your doctor.     WEIGHT BEARING   Partial weight bearing with assist device as directed.      CONSTIPATION  Constipation is defined medically as fewer than three stools per week and severe constipation as less than one stool per week.  Even if you have a regular bowel pattern at home, your normal regimen is likely to be disrupted due to multiple reasons following surgery.  Combination of anesthesia, postoperative narcotics, change in appetite and fluid intake all can affect your bowels.   YOU MUST use at least one of the following options; they are listed in order of increasing strength to get the job done.  They are all available over the counter, and you may need to use some, POSSIBLY even all of these options:    Drink plenty of fluids (prune juice may be helpful) and high fiber foods Colace 100 mg by mouth twice a day  Senokot for constipation as directed and as needed Dulcolax (bisacodyl), take with full glass of water  Miralax (polyethylene glycol) once or twice a day as needed.  If you have tried all these things and are unable to have a bowel movement in the first 3-4 days after surgery call either your surgeon or your primary doctor.    If you experience loose stools or diarrhea, hold the medications until you stool forms back up.  If your symptoms do not get better within 1 week or if they get worse, check with your doctor.  If you experience "the worst abdominal pain ever" or develop nausea or vomiting, please   contact the office immediately for further recommendations for treatment.   ITCHING:  If you experience itching with your medications, try taking only a single pain pill, or even half a pain pill at a time.  You can also use Benadryl over the counter for itching or also to help with sleep.   TED HOSE STOCKINGS:  Use stockings on both legs until for at least 2 weeks or as directed by physician office. They may be removed at night for sleeping.  MEDICATIONS:  See your medication summary on the "After Visit Summary" that nursing will review with you.  You may have some  home medications which will be placed on hold until you complete the course of blood thinner medication.  It is important for you to complete the blood thinner medication as prescribed.  PRECAUTIONS:  If you experience chest pain or shortness of breath - call 911 immediately for transfer to the hospital emergency department.   If you develop a fever greater that 101 F, purulent drainage from wound, increased redness or drainage from wound, foul odor from the wound/dressing, or calf pain - CONTACT YOUR SURGEON.                                                   FOLLOW-UP APPOINTMENTS:  If you do not already have a post-op appointment, please call the office for an appointment to be seen by your surgeon.  Guidelines for how soon to be seen are listed in your "After Visit Summary", but are typically between 1-4 weeks after surgery.  POST-OPERATIVE OPIOID TAPER INSTRUCTIONS: It is important to wean off of your opioid medication as soon as possible. If you do not need pain medication after your surgery it is ok to stop day one. Opioids include: Codeine, Hydrocodone(Norco, Vicodin), Oxycodone(Percocet, oxycontin) and hydromorphone amongst others.  Long term and even short term use of opiods can cause: Increased pain response Dependence Constipation Depression Respiratory depression And more.  Withdrawal symptoms can include Flu like symptoms Nausea, vomiting And more Techniques to manage these symptoms Hydrate well Eat regular healthy meals Stay active Use relaxation techniques(deep breathing, meditating, yoga) Do Not substitute Alcohol to help with tapering If you have been on opioids for less than two weeks and do not have pain than it is ok to stop all together.  Plan to wean off of opioids This plan should start within one week post op of your joint replacement. Maintain the same interval or time between taking each dose and first decrease the dose.  Cut the total daily intake of  opioids by one tablet each day Next start to increase the time between doses. The last dose that should be eliminated is the evening dose.   MAKE SURE YOU:  Understand these instructions.  Get help right away if you are not doing well or get worse.    Thank you for letting us be a part of your medical care team.  It is a privilege we respect greatly.  We hope these instructions will help you stay on track for a fast and full recovery!    Information on my medicine - ELIQUIS (apixaban)  Why was Eliquis prescribed for you? Eliquis was prescribed to treat blood clots that may have been found in the veins of your legs (deep vein thrombosis) or in your lungs (pulmonary  embolism) and to reduce the risk of them occurring again.  What do You need to know about Eliquis ? The starting dose is 10 mg (two 5 mg tablets) taken TWICE daily for the FIRST SEVEN (7) DAYS, then on (enter date)  02/12/2022  the dose is reduced to ONE 5 mg tablet taken TWICE daily.  Eliquis may be taken with or without food.   Try to take the dose about the same time in the morning and in the evening. If you have difficulty swallowing the tablet whole please discuss with your pharmacist how to take the medication safely.  Take Eliquis exactly as prescribed and DO NOT stop taking Eliquis without talking to the doctor who prescribed the medication.  Stopping may increase your risk of developing a new blood clot.  Refill your prescription before you run out.  After discharge, you should have regular check-up appointments with your healthcare provider that is prescribing your Eliquis.    What do you do if you miss a dose? If a dose of ELIQUIS is not taken at the scheduled time, take it as soon as possible on the same day and twice-daily administration should be resumed. The dose should not be doubled to make up for a missed dose.  Important Safety Information A possible side effect of Eliquis is bleeding. You should  call your healthcare provider right away if you experience any of the following: Bleeding from an injury or your nose that does not stop. Unusual colored urine (red or dark brown) or unusual colored stools (red or black). Unusual bruising for unknown reasons. A serious fall or if you hit your head (even if there is no bleeding).  Some medicines may interact with Eliquis and might increase your risk of bleeding or clotting while on Eliquis. To help avoid this, consult your healthcare provider or pharmacist prior to using any new prescription or non-prescription medications, including herbals, vitamins, non-steroidal anti-inflammatory drugs (NSAIDs) and supplements.  This website has more information on Eliquis (apixaban): http://www.eliquis.com/eliquis/home

## 2022-02-01 NOTE — Anesthesia Postprocedure Evaluation (Signed)
Anesthesia Post Note  Patient: Jason Mcpherson  Procedure(s) Performed: INTRAMEDULLARY (IM) NAIL FEMORAL (Right: Hip)     Patient location during evaluation: PACU Anesthesia Type: General Level of consciousness: awake and alert Pain management: pain level controlled Vital Signs Assessment: post-procedure vital signs reviewed and stable Respiratory status: spontaneous breathing, nonlabored ventilation, respiratory function stable and patient connected to nasal cannula oxygen Cardiovascular status: blood pressure returned to baseline and stable Postop Assessment: no apparent nausea or vomiting Anesthetic complications: no   No notable events documented.  Last Vitals:  Vitals:   02/01/22 1800 02/01/22 1813  BP: (!) 147/78 137/76  Pulse: 83 85  Resp: 12 16  Temp:  (!) 36.3 C  SpO2: 97% 97%    Last Pain:  Vitals:   02/01/22 1813  TempSrc: Oral  PainSc: 4                  Trudy Kory

## 2022-02-01 NOTE — Anesthesia Preprocedure Evaluation (Addendum)
Anesthesia Evaluation  Patient identified by MRN, date of birth, ID band Patient awake    Reviewed: Allergy & Precautions, H&P , NPO status , Patient's Chart, lab work & pertinent test results  Airway Mallampati: I  TM Distance: >3 FB Neck ROM: Full    Dental no notable dental hx. (+) Edentulous Upper, Edentulous Lower   Pulmonary neg pulmonary ROS, former smoker,    Pulmonary exam normal breath sounds clear to auscultation       Cardiovascular Exercise Tolerance: Good hypertension, Pt. on medications negative cardio ROS Normal cardiovascular exam Rhythm:Regular Rate:Normal     Neuro/Psych negative neurological ROS  negative psych ROS   GI/Hepatic negative GI ROS, Neg liver ROS,   Endo/Other  negative endocrine ROSdiabetes, Type 2  Renal/GU CRFRenal disease  negative genitourinary   Musculoskeletal  (+) Arthritis , Osteoarthritis,    Abdominal   Peds negative pediatric ROS (+)  Hematology  (+) Blood dyscrasia, anemia ,   Anesthesia Other Findings   Reproductive/Obstetrics negative OB ROS                            Anesthesia Physical Anesthesia Plan  ASA: 3 and emergent  Anesthesia Plan: General   Post-op Pain Management: Ofirmev IV (intra-op)*   Induction: Intravenous and Cricoid pressure planned  PONV Risk Score and Plan: 2 and Treatment may vary due to age or medical condition, Ondansetron and Dexamethasone  Airway Management Planned: Oral ETT  Additional Equipment: None  Intra-op Plan:   Post-operative Plan: Extubation in OR  Informed Consent: I have reviewed the patients History and Physical, chart, labs and discussed the procedure including the risks, benefits and alternatives for the proposed anesthesia with the patient or authorized representative who has indicated his/her understanding and acceptance.       Plan Discussed with: Anesthesiologist and  CRNA  Anesthesia Plan Comments: (  )       Anesthesia Quick Evaluation

## 2022-02-02 ENCOUNTER — Encounter (HOSPITAL_COMMUNITY): Payer: Self-pay | Admitting: Orthopedic Surgery

## 2022-02-02 DIAGNOSIS — I10 Essential (primary) hypertension: Secondary | ICD-10-CM | POA: Diagnosis not present

## 2022-02-02 DIAGNOSIS — D649 Anemia, unspecified: Secondary | ICD-10-CM

## 2022-02-02 DIAGNOSIS — W19XXXA Unspecified fall, initial encounter: Secondary | ICD-10-CM | POA: Diagnosis not present

## 2022-02-02 DIAGNOSIS — S72141A Displaced intertrochanteric fracture of right femur, initial encounter for closed fracture: Secondary | ICD-10-CM | POA: Diagnosis not present

## 2022-02-02 DIAGNOSIS — E119 Type 2 diabetes mellitus without complications: Secondary | ICD-10-CM | POA: Diagnosis not present

## 2022-02-02 LAB — CBC WITH DIFFERENTIAL/PLATELET
Abs Immature Granulocytes: 0.07 10*3/uL (ref 0.00–0.07)
Basophils Absolute: 0 10*3/uL (ref 0.0–0.1)
Basophils Relative: 0 %
Eosinophils Absolute: 0 10*3/uL (ref 0.0–0.5)
Eosinophils Relative: 0 %
HCT: 25.3 % — ABNORMAL LOW (ref 39.0–52.0)
Hemoglobin: 7.8 g/dL — ABNORMAL LOW (ref 13.0–17.0)
Immature Granulocytes: 1 %
Lymphocytes Relative: 3 %
Lymphs Abs: 0.3 10*3/uL — ABNORMAL LOW (ref 0.7–4.0)
MCH: 28.8 pg (ref 26.0–34.0)
MCHC: 30.8 g/dL (ref 30.0–36.0)
MCV: 93.4 fL (ref 80.0–100.0)
Monocytes Absolute: 0.9 10*3/uL (ref 0.1–1.0)
Monocytes Relative: 8 %
Neutro Abs: 10.5 10*3/uL — ABNORMAL HIGH (ref 1.7–7.7)
Neutrophils Relative %: 88 %
Platelets: 232 10*3/uL (ref 150–400)
RBC: 2.71 MIL/uL — ABNORMAL LOW (ref 4.22–5.81)
RDW: 15.3 % (ref 11.5–15.5)
WBC: 11.8 10*3/uL — ABNORMAL HIGH (ref 4.0–10.5)
nRBC: 0 % (ref 0.0–0.2)

## 2022-02-02 LAB — GLUCOSE, CAPILLARY
Glucose-Capillary: 155 mg/dL — ABNORMAL HIGH (ref 70–99)
Glucose-Capillary: 229 mg/dL — ABNORMAL HIGH (ref 70–99)
Glucose-Capillary: 276 mg/dL — ABNORMAL HIGH (ref 70–99)
Glucose-Capillary: 345 mg/dL — ABNORMAL HIGH (ref 70–99)
Glucose-Capillary: 353 mg/dL — ABNORMAL HIGH (ref 70–99)
Glucose-Capillary: 381 mg/dL — ABNORMAL HIGH (ref 70–99)

## 2022-02-02 LAB — COMPREHENSIVE METABOLIC PANEL
ALT: 11 U/L (ref 0–44)
AST: 13 U/L — ABNORMAL LOW (ref 15–41)
Albumin: 2.3 g/dL — ABNORMAL LOW (ref 3.5–5.0)
Alkaline Phosphatase: 39 U/L (ref 38–126)
Anion gap: 7 (ref 5–15)
BUN: 93 mg/dL — ABNORMAL HIGH (ref 8–23)
CO2: 16 mmol/L — ABNORMAL LOW (ref 22–32)
Calcium: 8.6 mg/dL — ABNORMAL LOW (ref 8.9–10.3)
Chloride: 116 mmol/L — ABNORMAL HIGH (ref 98–111)
Creatinine, Ser: 2.44 mg/dL — ABNORMAL HIGH (ref 0.61–1.24)
GFR, Estimated: 26 mL/min — ABNORMAL LOW (ref >60)
Glucose, Bld: 249 mg/dL — ABNORMAL HIGH (ref 70–99)
Potassium: 4.9 mmol/L (ref 3.5–5.1)
Sodium: 139 mmol/L (ref 135–145)
Total Bilirubin: 0.6 mg/dL (ref 0.3–1.2)
Total Protein: 5.7 g/dL — ABNORMAL LOW (ref 6.5–8.1)

## 2022-02-02 LAB — PHOSPHORUS: Phosphorus: 3 mg/dL (ref 2.5–4.6)

## 2022-02-02 LAB — MAGNESIUM: Magnesium: 2.1 mg/dL (ref 1.7–2.4)

## 2022-02-02 MED ORDER — INSULIN ASPART 100 UNIT/ML IJ SOLN
5.0000 [IU] | Freq: Three times a day (TID) | INTRAMUSCULAR | Status: DC
  Administered 2022-02-02 – 2022-02-08 (×17): 5 [IU] via SUBCUTANEOUS

## 2022-02-02 MED ORDER — INSULIN DETEMIR 100 UNIT/ML ~~LOC~~ SOLN
12.0000 [IU] | Freq: Every day | SUBCUTANEOUS | Status: DC
  Administered 2022-02-03 – 2022-02-04 (×2): 12 [IU] via SUBCUTANEOUS
  Filled 2022-02-02 (×2): qty 0.12

## 2022-02-02 MED ORDER — SODIUM BICARBONATE 8.4 % IV SOLN
INTRAVENOUS | Status: DC
  Filled 2022-02-02: qty 1000
  Filled 2022-02-02 (×2): qty 150

## 2022-02-02 NOTE — Inpatient Diabetes Management (Addendum)
Inpatient Diabetes Program Recommendations  AACE/ADA: New Consensus Statement on Inpatient Glycemic Control (2015)  Target Ranges:  Prepandial:   less than 140 mg/dL      Peak postprandial:   less than 180 mg/dL (1-2 hours)      Critically ill patients:  140 - 180 mg/dL   Lab Results  Component Value Date   GLUCAP 276 (H) 02/02/2022   HGBA1C 6.7 (H) 01/30/2022    Review of Glycemic Control  Latest Reference Range & Units 02/02/22 08:18 02/02/22 11:49  Glucose-Capillary 70 - 99 mg/dL 155 (H) 276 (H)  (H): Data is abnormally high  Diabetes history: Type 2 DM Outpatient Diabetes medications: Amaryl 1.5 mg QD, Farxiga 5 mg QD Current orders for Inpatient glycemic control: Novolog 0-15 units Q4H, Levemir 5 units QAM, Decadron 10 mg this am x 1; had 4 mg on 02/01/22  Inpatient Diabetes Program Recommendations:    Novolog 3 units TID with meals if he consumes at least 50%  Will continue to follow while inpatient.  Thank you, Reche Dixon, MSN, [place] Diabetes Coordinator Inpatient Diabetes Program [phone] (team pager from 8a-5p)

## 2022-02-02 NOTE — Evaluation (Signed)
Physical Therapy Evaluation Patient Details Name: Jason Mcpherson MRN: [id number] DOB: 1941/02/13 Today's Date: 02/02/2022  History of Present Illness  81 y.o. male with medical history significant of HTN, CKD 3a, DM2, HLD, ACDF. Presenting with right hip pain after fall, resulting in R hip fx. orthopedics consulted. pt required preop transfusion, no s/p IM nail on 02/01/22.  Clinical Impression  Pt admitted with above diagnosis.  Pt very HOH, agreeable to get OOB. Attempted to educated pt on PWB as did pt's son. Pt was not placing more than 50% d/t pain during transfer, will continue efforts to inform pt of precautions. Family is aware of WBing status. Recommend SNF to incr pt independence and safety for return home with family. Family very supportive however unable to manage pt at current status.  Pt currently with functional limitations due to the deficits listed below (see PT Problem List). Pt will benefit from skilled PT to increase their independence and safety with mobility to allow discharge to the venue listed below.          Recommendations for follow up therapy are one component of a multi-disciplinary discharge planning process, led by the attending physician.  Recommendations may be updated based on patient status, additional functional criteria and insurance authorization.  Follow Up Recommendations Skilled nursing-short term rehab (<3 hours/day) Can patient physically be transported by private vehicle: No    Assistance Recommended at Discharge    Patient can return home with the following  Help with stairs or ramp for entrance;A lot of help with walking and/or transfers;A lot of help with bathing/dressing/bathroom;Assist for transportation;Assistance with cooking/housework    Equipment Recommendations None recommended by PT (defer to SNF)  Recommendations for Other Services       Functional Status Assessment Patient has had a recent decline in their functional status and  demonstrates the ability to make significant improvements in function in a reasonable and predictable amount of time.     Precautions / Restrictions Precautions Precautions: Fall Restrictions Weight Bearing Restrictions: Yes RLE Weight Bearing: Partial weight bearing RLE Partial Weight Bearing Percentage or Pounds: 50%      Mobility  Bed Mobility Overal bed mobility: Needs Assistance Bed Mobility: Supine to Sit     Supine to sit: Mod assist     General bed mobility comments: assist to progress RLE off bed and elevate trunk, incr time    Transfers Overall transfer level: Needs assistance Equipment used: Rolling walker (2 wheels) Transfers: Sit to/from Stand, Bed to chair/wheelchair/BSC Sit to Stand: Mod assist, From elevated surface   Step pivot transfers: Mod assist       General transfer comment: assist to rise and transition to RW. multi-modal cues for hand placement and wt shift; cues for step pivot transfer, assist to advance RLE, balance and maneuver RW    Ambulation/Gait                  Stairs            Wheelchair Mobility    Modified Rankin (Stroke Patients Only)       Balance Overall balance assessment: Needs assistance Sitting-balance support: Feet supported, No upper extremity supported Sitting balance-Leahy Scale: Fair     Standing balance support: Reliant on assistive device for balance, During functional activity Standing balance-Leahy Scale: Poor                               Pertinent  Vitals/Pain Pain Assessment Pain Assessment: Faces Faces Pain Scale: Hurts little more Pain Location: right hip with activity Pain Descriptors / Indicators: Guarding, Grimacing, Sore Pain Intervention(s): Limited activity within patient's tolerance, Monitored during session, Repositioned, Ice applied    Home Living Family/patient expects to be discharged to:: Private residence Living Arrangements: Spouse/significant  other;Children Available Help at Discharge: Family Type of Home: House Home Access: Ramped entrance       Home Layout: One level Home Equipment: Conservation officer, nature (2 wheels) Additional Comments: resides with wife and son    Prior Function Prior Level of Function : History of Falls (last six months);Needs assist       Physical Assist : Mobility (physical) Mobility (physical): Gait   Mobility Comments: assist with amb with RW; was doign HHPT until August       Hand Dominance        Extremity/Trunk Assessment   Upper Extremity Assessment Upper Extremity Assessment: Generalized weakness    Lower Extremity Assessment Lower Extremity Assessment: Generalized weakness;RLE deficits/detail RLE Deficits / Details: positions R hip in end range external rotation, resistant to neutral RLE: Unable to fully assess due to pain       Communication   Communication: HOH  Cognition Arousal/Alertness: Awake/alert Behavior During Therapy: WFL for tasks assessed/performed Overall Cognitive Status: Within Functional Limits for tasks assessed                                          General Comments      Exercises     Assessment/Plan    PT Assessment Patient needs continued PT services  PT Problem List Decreased strength;Decreased mobility;Decreased activity tolerance;Decreased balance;Pain;Decreased range of motion       PT Treatment Interventions DME instruction;Therapeutic exercise;Gait training;Functional mobility training;Therapeutic activities;Patient/family education    PT Goals (Current goals can be found in the Care Plan section)  Acute Rehab PT Goals Patient Stated Goal: to go to rehab PT Goal Formulation: With family Time For Goal Achievement: 02/16/22 Potential to Achieve Goals: Good    Frequency Min 3X/week     Co-evaluation               AM-PAC PT "6 Clicks" Mobility  Outcome Measure Help needed turning from your back to your side  while in a flat bed without using bedrails?: A Lot Help needed moving from lying on your back to sitting on the side of a flat bed without using bedrails?: A Lot Help needed moving to and from a bed to a chair (including a wheelchair)?: A Lot Help needed standing up from a chair using your arms (e.g., wheelchair or bedside chair)?: A Lot Help needed to walk in hospital room?: Total Help needed climbing 3-5 steps with a railing? : Total 6 Click Score: 10    End of Session Equipment Utilized During Treatment: Gait belt Activity Tolerance: Patient tolerated treatment well Patient left: with call bell/phone within reach;in chair;with family/visitor present (no alarm box)   PT Visit Diagnosis: Unsteadiness on feet (R26.81);Difficulty in walking, not elsewhere classified (R26.2)    Time: 1829-9371 PT Time Calculation (min) (ACUTE ONLY): 23 min   Charges:   PT Evaluation $PT Eval Low Complexity: 1 Low PT Treatments $Therapeutic Activity: 8-22 mins        Baxter Flattery, PT  Acute Rehab Dept [facility]) [phone]  [facility] Weekend Pager [facility] only)  [phone]  02/02/2022  [facility] 02/02/2022, 12:50 PM

## 2022-02-02 NOTE — Plan of Care (Signed)
  Problem: Safety: Goal: Ability to remain free from injury will improve Outcome: Progressing   Problem: Pain Managment: Goal: General experience of comfort will improve Outcome: Progressing   

## 2022-02-02 NOTE — TOC Progression Note (Signed)
Transition of Care [facility]) - Progression Note    Patient Details  Name: Jason Mcpherson MRN: [id number] Date of Birth: 03-Dec-1940  [facility] of [facility]) CM/SW Contact  Lennart Pall, [place] Phone Number: 02/02/2022, 2:32 PM  Clinical Narrative:    Met with pt and spoke with wife to confirm they are in agreement with therapy recommendations for SNF rehab.  They are agreed and bed search initiated with focus on [place] area facilities.  Continue to follow.   Expected Discharge Plan: Home/Self Care Barriers to Discharge: Continued Medical Work up  Expected Discharge Plan and Services Expected Discharge Plan: Home/Self Care   Discharge Planning Services: CM Consult   Living arrangements for the past 2 months: Single Family Home                                       Social Determinants of Health (SDOH) Interventions    Readmission Risk Interventions    01/31/2022    2:47 PM  Readmission Risk Prevention Plan  Transportation Screening Complete  PCP or Specialist Appt within 5-7 Days Complete  Home Care Screening Complete  Medication Review (RN CM) Complete

## 2022-02-02 NOTE — Progress Notes (Signed)
Jason Mcpherson OMV:[id number] DOB: 12-24-1940 DOA: 01/30/2022 PCP: Doreatha Lew, MD   Subj: Jason Mcpherson is a 81 y.o.WM PMHx  HTN, CKD stage 3a, DM type II without complications, HLD.  Anemia,  Presenting with right hip pain after fall. History per family at bedside. He was walking through the house 2 days ago when he had an unwitnessed fall. He was walking without his walker at the time. He didn't pass out. Family was able to help him up shortly after his fall. He didn't complain of any chest pain or palpitations. Later that evening, he was able to stand on his own without any problems. However, yesterday, he began having pain and difficulty mobilizing with his walker. This morning, he was unable to walk d/t pain. His family became concerned and called for EMS. They deny any other aggravating or alleviating factors.    Obj: 10/3 afebrile overnight A/O x4, extremely hard of hearing.  Patient ready for surgery today.   Objective: VITAL SIGNS: Temp: 98.8 F (37.1 C) (10/03 1402) Temp Source: Oral (10/03 1402) BP: 122/70 (10/03 1402) Pulse Rate: 79 (10/03 1402) SPO2; FIO2:   Intake/Output Summary (Last 24 hours) at 02/02/2022 1452 Last data filed at 02/02/2022 0600 Gross per 24 hour  Intake 1783.93 ml  Output 1200 ml  Net 583.93 ml      Exam: General: A/O x4 No acute respiratory distress Lungs: Clear to auscultation bilaterally without wheezes or crackles Cardiovascular: Regular rate and rhythm without murmur gallop or rub normal S1 and S2 Abdomen: Nontender, nondistended, soft, bowel sounds positive, no rebound, no ascites, no appreciable mass Extremities: No significant cyanosis, clubbing, or edema bilateral lower extremities.  Positive appropriate RIGHT hip pain Skin: Negative rashes, lesions, ulcers Psychiatric:  Negative depression, negative anxiety, negative fatigue, negative mania  Central nervous system:  Cranial nerves II through XII intact, negative  dysarthria, negative expressive aphasia, negative receptive aphasia.    Mobility Assessment (last 72 hours)     Mobility Assessment     Row Name 02/02/22 1247 02/02/22 0735 02/01/22 0900 02/01/22 0752 01/31/22 2030   Does patient have an order for bedrest or is patient medically unstable -- No - Continue assessment Yes- Bedfast (Level 1) - Complete Yes- Bedfast (Level 1) - Complete Yes- Bedfast (Level 1) - Complete   What is the highest level of mobility based on the progressive mobility assessment? Level 3 (Stands with assist) - Balance while standing  and cannot march in place Level 2 (Chairfast) - Balance while sitting on edge of bed and cannot stand -- -- --    Row Name 01/31/22 1004 01/30/22 2100 01/30/22 1500       Does patient have an order for bedrest or is patient medically unstable Yes- Bedfast (Level 1) - Complete Yes- Bedfast (Level 1) - Complete Yes- Bedfast (Level 1) - Complete  fracture precautions                DVT prophylaxis: Subcu heparin Code Status: DNR Family Communication: 10/2 wife and son at bedside for discussion of plan of care all questions answered Status is: Inpatient    Dispo: The patient is from: Home              Anticipated d/c is to: Home              Anticipated d/c date is: 3 days              Patient currently is not medically stable  to d/c.    Procedures/Significant Events: 10/2 Closed reduction, intramedullary nailing of RIGHT proximal femur.   Consultants:  Orthopedic surgery   Cultures   Antimicrobials:   A/P  Fall   Comminuted right intertrochanteric femur fracture -S/p repair of right hip fracture see above   HLD -10/3 lipid panel pending     - continue home regimen   DM type II controlled without complication - 6/50 hemoglobin A1c= 6.7 - 10/2 increase moderate SSI  - 10/3 increase Levemir 12 units -12/3 NovoLog 5 units qac CBG (last 3)  Recent Labs    02/02/22 0358 02/02/22 0818 02/02/22 1149   GLUCAP 229* 155* 276*          Essential HTN -Amlodipine 5 mg daily - Spironolactone 12.5 mg daily   CKD stage 3a (baseline CR 1.5-2.1)  Lab Results  Component Value Date   CREATININE 2.44 (H) 02/02/2022   CREATININE 2.42 (H) 02/01/2022   CREATININE 2.54 (H) 01/31/2022   CREATININE 2.16 (H) 01/30/2022   CREATININE 2.67 (H) 08/10/2021  -10/3 strict in and out - Daily weight -10/3 sodium bicarb 44ml/hr   Anemia chronic disease -Anemia panel consistent with anemia of chronic disease - 10/1 occult blood pending - 10/1 transfuse for hemoglobin<7 - 10/1 transfuse 2 unit PRBC   Pressure Injury 01/31/22 Buttocks Right Stage 2 -  Partial thickness loss of dermis presenting as a shallow open injury with a red, pink wound bed without slough. (Active)  01/31/22 2030  Location: Buttocks  Location Orientation: Right  Staging: Stage 2 -  Partial thickness loss of dermis presenting as a shallow open injury with a red, pink wound bed without slough.  Wound Description (Comments):   Present on Admission: -- (unknown)     Pressure Injury 01/31/22 Coccyx Right;Left Stage 1 -  Intact skin with non-blanchable redness of a localized area usually over a bony prominence. (Active)  01/31/22 2030  Location: Coccyx  Location Orientation: Right;Left  Staging: Stage 1 -  Intact skin with non-blanchable redness of a localized area usually over a bony prominence.  Wound Description (Comments):   Present on Admission: -- (unknown)         Care during the described time interval was provided by me .  I have reviewed this patient's available data, including medical history, events of note, physical examination, and all test results as part of my evaluation.

## 2022-02-02 NOTE — Plan of Care (Signed)
  Problem: Safety: Goal: Ability to remain free from injury will improve Outcome: Progressing   Problem: Education: Goal: Knowledge of the prescribed therapeutic regimen will improve Outcome: Progressing   Problem: Pain Management: Goal: Pain level will decrease with appropriate interventions Outcome: Progressing

## 2022-02-02 NOTE — NC FL2 (Signed)
[place] LEVEL OF CARE SCREENING TOOL     IDENTIFICATION  Patient Name: Jason Mcpherson Birthdate: 22-Aug-1940 Sex: male Admission Date (Current Location): 01/30/2022  [facility] and [place] Number:  Herbalist and Address:  [facility],  [address] [place], [place]      Provider Number: [id number]  Attending Physician Name and Address:  Allie Bossier, MD  Relative Name and Phone Number:  wife, Quaran Kedzierski [phone]    Current Level of Care: Hospital Recommended Level of Care: [place] Prior Approval Number:    Date Approved/Denied:   PASRR Number: [id number]  Discharge Plan: SNF    Current Diagnoses: Patient Active Problem List   Diagnosis Date Noted   Closed right hip fracture ([place]) 01/30/2022   Normocytic anemia 01/30/2022   Stage 3a chronic kidney disease (CKD) ([place]) 01/30/2022   Fall at home, initial encounter 01/30/2022   Pure hypercholesterolemia 12/16/2014   Type II or unspecified type diabetes mellitus without mention of complication, uncontrolled 10/12/2013   Essential hypertension, benign 10/12/2013   Bacteremia 08/25/2013   Cellulitis 08/17/2013   DM2 (diabetes mellitus, type 2) ([place]) 08/17/2013   HTN (hypertension) 08/17/2013   Dehydration 08/17/2013   Nausea with vomiting 08/17/2013   Hyperkalemia 08/17/2013   Spinal stenosis in cervical region 10/17/2012   Pain in joint involving lower leg 10/07/2012    Orientation RESPIRATION BLADDER Height & Weight     Self, Time, Situation, Place  Normal Incontinent, External catheter Weight: 135 lb 2.3 oz (61.3 kg) Height:  5\' 8"  (172.7 cm)  BEHAVIORAL SYMPTOMS/MOOD NEUROLOGICAL BOWEL NUTRITION STATUS      Incontinent Diet (Carb modified)  AMBULATORY STATUS COMMUNICATION OF NEEDS Skin   Extensive Assist Verbally PU Stage and Appropriate Care (stage 2 pressure injury on buttocks/ stage 1 pressure inj on coccyx - no dressing)                        Personal Care Assistance Level of Assistance  Bathing, Dressing Bathing Assistance: Limited assistance   Dressing Assistance: Limited assistance     Functional Limitations Info  Hearing   Hearing Info: Impaired      SPECIAL CARE FACTORS FREQUENCY  PT (By licensed PT), OT (By licensed OT)     PT Frequency: 5x/wk OT Frequency: 5x/wk            Contractures Contractures Info: Not present    Additional Factors Info  Code Status, Allergies, Insulin Sliding Scale Code Status Info: DNR Allergies Info: Amlodipine   Insulin Sliding Scale Info: see MAR       Current Medications (02/02/2022):  This is the current hospital active medication list Current Facility-Administered Medications  Medication Dose Route Frequency Provider Last Rate Last Admin   0.9 %  sodium chloride infusion   Intravenous Continuous Irving Copas, PA-C 75 mL/hr at 02/01/22 1951 New Bag at 02/01/22 1951   0.9 %  sodium chloride infusion   Intravenous Continuous Paralee Cancel, MD       acetaminophen (TYLENOL) tablet 325-650 mg  325-650 mg Oral Q6H PRN Paralee Cancel, MD       amLODipine (NORVASC) tablet 5 mg  5 mg Oral Daily Irving Copas, PA-C   5 mg at 02/02/22 0867   aspirin chewable tablet 81 mg  81 mg Oral BID Irving Copas, PA-C   81 mg at 02/02/22 0849   bisacodyl (DULCOLAX) suppository 10 mg  10  mg Rectal Daily PRN Irving Copas, PA-C       diphenhydrAMINE (BENADRYL) 12.5 MG/5ML elixir 12.5-25 mg  12.5-25 mg Oral Q4H PRN Irving Copas, PA-C       docusate sodium (COLACE) capsule 100 mg  100 mg Oral BID Irving Copas, PA-C   100 mg at 02/02/22 0848   feeding supplement (ENSURE ENLIVE / ENSURE PLUS) liquid 237 mL  237 mL Oral BID BM Irving Copas, PA-C   237 mL at 02/02/22 0849   ferrous sulfate tablet 325 mg  325 mg Oral q morning Irving Copas, PA-C   325 mg at 02/02/22 0848   HYDROcodone-acetaminophen (NORCO) 7.5-325 MG per tablet 1-2 tablet  1-2 tablet Oral Q4H  PRN Paralee Cancel, MD       HYDROcodone-acetaminophen (NORCO/VICODIN) 5-325 MG per tablet 1-2 tablet  1-2 tablet Oral Q4H PRN Paralee Cancel, MD   1 tablet at 02/02/22 0535   insulin aspart (novoLOG) injection 0-15 Units  0-15 Units Subcutaneous Q4H Irving Copas, PA-C   8 Units at 02/02/22 1215   insulin detemir (LEVEMIR) injection 5 Units  5 Units Subcutaneous Daily Irving Copas, PA-C   5 Units at 02/02/22 1007   menthol-cetylpyridinium (CEPACOL) lozenge 3 mg  1 lozenge Oral PRN Irving Copas, PA-C       Or   phenol (CHLORASEPTIC) mouth spray 1 spray  1 spray Mouth/Throat PRN Irving Copas, PA-C       methocarbamol (ROBAXIN) tablet 500 mg  500 mg Oral Q6H PRN Irving Copas, PA-C       Or   methocarbamol (ROBAXIN) 500 mg in dextrose 5 % 50 mL IVPB  500 mg Intravenous Q6H PRN Irving Copas, PA-C   Stopped at 02/01/22 1758   metoCLOPramide (REGLAN) tablet 5-10 mg  5-10 mg Oral Q8H PRN Paralee Cancel, MD       Or   metoCLOPramide (REGLAN) injection 5-10 mg  5-10 mg Intravenous Q8H PRN Paralee Cancel, MD       morphine (PF) 2 MG/ML injection 0.5-1 mg  0.5-1 mg Intravenous Q2H PRN Paralee Cancel, MD       multivitamin with minerals tablet 1 tablet  1 tablet Oral Daily Irving Copas, PA-C   1 tablet at 02/02/22 0848   ondansetron (ZOFRAN) tablet 4 mg  4 mg Oral Q6H PRN Paralee Cancel, MD       Or   ondansetron [facility]) injection 4 mg  4 mg Intravenous Q6H PRN Paralee Cancel, MD       polyethylene glycol (MIRALAX / GLYCOLAX) packet 17 g  17 g Oral Daily PRN Costella Hatcher R, PA-C       polyethylene glycol (MIRALAX / GLYCOLAX) packet 17 g  17 g Oral Daily Paralee Cancel, MD   17 g at 02/02/22 0855   simvastatin (ZOCOR) tablet 20 mg  20 mg Oral QPM Irving Copas, PA-C   20 mg at 01/31/22 1758   spironolactone (ALDACTONE) tablet 12.5 mg  12.5 mg Oral Daily Irving Copas, PA-C   12.5 mg at 02/02/22 1219     Discharge Medications: Please see discharge summary for a list  of discharge medications.  Relevant Imaging Results:  Relevant Lab Results:   Additional Information SS# [id number]  Lennart Pall, LCSW

## 2022-02-02 NOTE — Progress Notes (Signed)
Patient ID: Jason Mcpherson, male   DOB: [birth year], 81 y.o.   MRN: [id number] Subjective: 1 Day Post-Op Procedure(s) (LRB): INTRAMEDULLARY (IM) NAIL FEMORAL (Right)    Patient reports pain as mild to moderate but hasn't tried to move too much post operative at this point  Objective:   VITALS:   Vitals:   02/02/22 0128 02/02/22 0527  BP: 119/71 108/71  Pulse: 76 71  Resp: 16 14  Temp: 97.7 F (36.5 C) (!) 97.5 F (36.4 C)  SpO2: 99% 99%    Neurovascular intact Incision: dressing C/D/I - right lateral hip  LABS Recent Labs    01/31/22 0414 01/31/22 0612 01/31/22 2237 02/01/22 0343 02/02/22 0338  HGB 6.6*   < > 9.4* 8.7* 7.8*  HCT 21.4*   < > 29.6* 27.7* 25.3*  WBC 12.3*  --   --  13.3* 11.8*  PLT 215  --   --  241 232   < > = values in this interval not displayed.    Recent Labs    01/31/22 0414 02/01/22 0343 02/02/22 0338  NA 140 137 139  K 4.4 4.6 4.9  BUN 79* 87* 93*  CREATININE 2.54* 2.42* 2.44*  GLUCOSE 182* 365* 249*    No results for input(s): "LABPT", "INR" in the last 72 hours.   Assessment/Plan: 1 Day Post-Op Procedure(s) (LRB): INTRAMEDULLARY (IM) NAIL FEMORAL (Right)   Advance diet Up with therapy - PWB RLE Disposition pending PT evaluation and family support ASA BID for DVT prophylaxis Norco for pain RTC in 2 weeks for initial follow up

## 2022-02-03 DIAGNOSIS — S72001D Fracture of unspecified part of neck of right femur, subsequent encounter for closed fracture with routine healing: Secondary | ICD-10-CM | POA: Diagnosis not present

## 2022-02-03 DIAGNOSIS — E119 Type 2 diabetes mellitus without complications: Secondary | ICD-10-CM | POA: Diagnosis not present

## 2022-02-03 DIAGNOSIS — L8990 Pressure ulcer of unspecified site, unspecified stage: Secondary | ICD-10-CM | POA: Insufficient documentation

## 2022-02-03 DIAGNOSIS — I10 Essential (primary) hypertension: Secondary | ICD-10-CM | POA: Diagnosis not present

## 2022-02-03 DIAGNOSIS — D649 Anemia, unspecified: Secondary | ICD-10-CM | POA: Diagnosis not present

## 2022-02-03 LAB — GLUCOSE, CAPILLARY
Glucose-Capillary: 178 mg/dL — ABNORMAL HIGH (ref 70–99)
Glucose-Capillary: 222 mg/dL — ABNORMAL HIGH (ref 70–99)
Glucose-Capillary: 232 mg/dL — ABNORMAL HIGH (ref 70–99)
Glucose-Capillary: 236 mg/dL — ABNORMAL HIGH (ref 70–99)
Glucose-Capillary: 277 mg/dL — ABNORMAL HIGH (ref 70–99)
Glucose-Capillary: 300 mg/dL — ABNORMAL HIGH (ref 70–99)
Glucose-Capillary: 321 mg/dL — ABNORMAL HIGH (ref 70–99)

## 2022-02-03 LAB — COMPREHENSIVE METABOLIC PANEL
ALT: 6 U/L (ref 0–44)
AST: 17 U/L (ref 15–41)
Albumin: 2.5 g/dL — ABNORMAL LOW (ref 3.5–5.0)
Alkaline Phosphatase: 52 U/L (ref 38–126)
Anion gap: 9 (ref 5–15)
BUN: 48 mg/dL — ABNORMAL HIGH (ref 8–23)
CO2: 18 mmol/L — ABNORMAL LOW (ref 22–32)
Calcium: 9 mg/dL (ref 8.9–10.3)
Chloride: 109 mmol/L (ref 98–111)
Creatinine, Ser: 2.75 mg/dL — ABNORMAL HIGH (ref 0.61–1.24)
GFR, Estimated: 22 mL/min — ABNORMAL LOW (ref >60)
Glucose, Bld: 258 mg/dL — ABNORMAL HIGH (ref 70–99)
Potassium: 5.4 mmol/L — ABNORMAL HIGH (ref 3.5–5.1)
Sodium: 136 mmol/L (ref 135–145)
Total Bilirubin: 0.5 mg/dL (ref 0.3–1.2)
Total Protein: 6.1 g/dL — ABNORMAL LOW (ref 6.5–8.1)

## 2022-02-03 LAB — BASIC METABOLIC PANEL
Anion gap: 8 (ref 5–15)
BUN: 99 mg/dL — ABNORMAL HIGH (ref 8–23)
CO2: 19 mmol/L — ABNORMAL LOW (ref 22–32)
Calcium: 8.8 mg/dL — ABNORMAL LOW (ref 8.9–10.3)
Chloride: 107 mmol/L (ref 98–111)
Creatinine, Ser: 2.42 mg/dL — ABNORMAL HIGH (ref 0.61–1.24)
GFR, Estimated: 26 mL/min — ABNORMAL LOW (ref >60)
Glucose, Bld: 328 mg/dL — ABNORMAL HIGH (ref 70–99)
Potassium: 5.2 mmol/L — ABNORMAL HIGH (ref 3.5–5.1)
Sodium: 134 mmol/L — ABNORMAL LOW (ref 135–145)

## 2022-02-03 LAB — CBC WITH DIFFERENTIAL/PLATELET
Abs Immature Granulocytes: 0.09 10*3/uL — ABNORMAL HIGH (ref 0.00–0.07)
Basophils Absolute: 0 10*3/uL (ref 0.0–0.1)
Basophils Relative: 0 %
Eosinophils Absolute: 0 10*3/uL (ref 0.0–0.5)
Eosinophils Relative: 0 %
HCT: 28 % — ABNORMAL LOW (ref 39.0–52.0)
Hemoglobin: 8.8 g/dL — ABNORMAL LOW (ref 13.0–17.0)
Immature Granulocytes: 1 %
Lymphocytes Relative: 6 %
Lymphs Abs: 0.8 10*3/uL (ref 0.7–4.0)
MCH: 29.2 pg (ref 26.0–34.0)
MCHC: 31.4 g/dL (ref 30.0–36.0)
MCV: 93 fL (ref 80.0–100.0)
Monocytes Absolute: 1.2 10*3/uL — ABNORMAL HIGH (ref 0.1–1.0)
Monocytes Relative: 9 %
Neutro Abs: 12.2 10*3/uL — ABNORMAL HIGH (ref 1.7–7.7)
Neutrophils Relative %: 84 %
Platelets: 294 10*3/uL (ref 150–400)
RBC: 3.01 MIL/uL — ABNORMAL LOW (ref 4.22–5.81)
RDW: 15 % (ref 11.5–15.5)
WBC: 14.3 10*3/uL — ABNORMAL HIGH (ref 4.0–10.5)
nRBC: 0 % (ref 0.0–0.2)

## 2022-02-03 LAB — LIPID PANEL
Cholesterol: 124 mg/dL (ref 0–200)
HDL: 47 mg/dL (ref >40)
LDL Cholesterol: 72 mg/dL (ref 0–99)
Total CHOL/HDL Ratio: 2.6 RATIO
Triglycerides: 27 mg/dL (ref <150)
VLDL: 5 mg/dL (ref 0–40)

## 2022-02-03 LAB — PHOSPHORUS: Phosphorus: 2.5 mg/dL (ref 2.5–4.6)

## 2022-02-03 LAB — MAGNESIUM: Magnesium: 2.3 mg/dL (ref 1.7–2.4)

## 2022-02-03 MED ORDER — HYDROCODONE-ACETAMINOPHEN 5-325 MG PO TABS: 1.0000 | ORAL_TABLET | ORAL | 0 refills | Status: DC | PRN

## 2022-02-03 MED ORDER — SODIUM ZIRCONIUM CYCLOSILICATE 10 G PO PACK
10.0000 g | PACK | Freq: Two times a day (BID) | ORAL | Status: DC
  Administered 2022-02-03 – 2022-02-04 (×2): 10 g via ORAL
  Filled 2022-02-03 (×2): qty 1

## 2022-02-03 MED ORDER — SODIUM ZIRCONIUM CYCLOSILICATE 10 G PO PACK
10.0000 g | PACK | Freq: Two times a day (BID) | ORAL | Status: DC
  Filled 2022-02-03: qty 1

## 2022-02-03 NOTE — Progress Notes (Addendum)
PROGRESS NOTE    Jason Mcpherson  AOZ:[id number] DOB: [birth year] DOA: 01/30/2022 PCP: Doreatha Lew, MD    Chief Complaint  Patient presents with   Fall   Leg Injury    Brief Narrative: Jason Mcpherson is a 81 y.o.WM PMHx  HTN, CKD stage 3a, DM type II without complications, HLD.  Anemia,   Presenting with right hip pain after fall. History per family at bedside. He was walking through the house 2 days ago when he had an unwitnessed fall. X-ray shows right proximal femur fracture.  Assessment & Plan:   Principal Problem:   Closed right hip fracture (HCC) Active Problems:   DM2 (diabetes mellitus, type 2) (HCC)   HTN (hypertension)   Pure hypercholesterolemia   Normocytic anemia   Stage 3a chronic kidney disease (CKD) ([place])   Fall at home, initial encounter   Pressure injury of skin    Communicated right intertrochanteric femur fracture S/p closed reduction with intramedullary nailing of right proximal femur on 02/01/2022 Pain control and therapy evaluation.   Hyperlipidemia Continue with home regimen    Type 2 diabetes mellitus Hemoglobin A1c at 6.7 Continue with sliding scale insulin while inpatient.     Hypertension Blood pressure parameters are optimal.    Stage IIIa CKD  continue to monitor.    Acute anemia of blood loss superimposed on anemia of chronic disease Patient underwent 2 units of PRBC transfusion and hemoglobin appears to be stable at this time.  Hyperkalemia:  Lokelma ordered.  Recheck K TONIGHT.    Pressure Injury 01/31/22 Buttocks Right Stage 2 -  Partial thickness loss of dermis presenting as a shallow open injury with a red, pink wound bed without slough. (Active)  01/31/22 2030  Location: Buttocks  Location Orientation: Right  Staging: Stage 2 -  Partial thickness loss of dermis presenting as a shallow open injury with a red, pink wound bed without slough.  Wound Description (Comments):   Present on Admission: --  (unknown)     Pressure Injury 01/31/22 Coccyx Right;Left Stage 1 -  Intact skin with non-blanchable redness of a localized area usually over a bony prominence. (Active)  01/31/22 2030  Location: Coccyx  Location Orientation: Right;Left  Staging: Stage 1 -  Intact skin with non-blanchable redness of a localized area usually over a bony prominence.  Wound Description (Comments):   Present on Admission: -- (unknown)    Continue with wound care.    DVT prophylaxis: aspirin  Code Status: DNR Family Communication: None at bedside disposition:   Status is: Inpatient Remains inpatient appropriate because: pending PT evaluation.    Level of care: Med-Surg Consultants:  Orthopedics.   Procedures:   Antimicrobials: None   Subjective: No new complaints  Objective: Vitals:   02/03/22 0311 02/03/22 0453 02/03/22 0827 02/03/22 1354  BP:  (!) 143/80 121/71 (!) 141/72  Pulse:  73 77 84  Resp:  17 16 16   Temp:  98.3 F (36.8 C) 98.1 F (36.7 C) 98.2 F (36.8 C)  TempSrc:  Oral Oral Oral  SpO2:  98% 99% 98%  Weight: 67.7 kg     Height:        Intake/Output Summary (Last 24 hours) at 02/03/2022 1423 Last data filed at 02/03/2022 0730 Gross per 24 hour  Intake 628.41 ml  Output 400 ml  Net 228.41 ml   Filed Weights   01/30/22 1546 02/03/22 0311  Weight: 61.3 kg 67.7 kg    Examination:  General exam: Appears  calm and comfortable  Respiratory system: Clear to auscultation. Respiratory effort normal. Cardiovascular system: S1 & S2 heard, RRR. No JVD, No pedal edema. Gastrointestinal system: Abdomen is nondistended, soft and nontender. Normal bowel sounds heard. Central nervous system: Alert and oriented. No focal neurological deficits. Extremities: Symmetric 5 x 5 power. Skin: No rashes, lesions or ulcers Psychiatry:  Mood & affect appropriate.     Data Reviewed: I have personally reviewed following labs and imaging studies  CBC: Recent Labs  Lab 01/30/22 1054  01/31/22 0414 01/31/22 0612 01/31/22 2237 02/01/22 0343 02/02/22 0338 02/03/22 0345  WBC 13.5* 12.3*  --   --  13.3* 11.8* 14.3*  NEUTROABS 11.4*  --   --   --  12.1* 10.5* 12.2*  HGB 7.2* 6.6* 6.5* 9.4* 8.7* 7.8* 8.8*  HCT 23.1* 21.4* 22.0* 29.6* 27.7* 25.3* 28.0*  MCV 94.3 96.0  --   --  92.3 93.4 93.0  PLT 248 215  --   --  241 232 299    Basic Metabolic Panel: Recent Labs  Lab 01/30/22 1054 01/31/22 0414 02/01/22 0343 02/02/22 0338 02/03/22 0345  NA 138 140 137 139 136  K 4.1 4.4 4.6 4.9 5.4*  CL 112* 115* 113* 116* 109  CO2 19* 18* 18* 16* 18*  GLUCOSE 204* 182* 365* 249* 258*  BUN 77* 79* 87* 93* 48*  CREATININE 2.16* 2.54* 2.42* 2.44* 2.75*  CALCIUM 8.6* 8.5* 8.6* 8.6* 9.0  MG 2.1  --  2.3 2.1 2.3  PHOS  --   --  3.6 3.0 2.5    GFR: Estimated Creatinine Clearance: 19.8 mL/min (A) (by C-G formula based on SCr of 2.75 mg/dL (H)).  Liver Function Tests: Recent Labs  Lab 01/30/22 1054 02/01/22 0343 02/02/22 0338 02/03/22 0345  AST 14* 12* 13* 17  ALT 12 13 11 6   ALKPHOS 43 48 39 52  BILITOT 0.4 0.7 0.6 0.5  PROT 6.3* 6.3* 5.7* 6.1*  ALBUMIN 2.8* 2.6* 2.3* 2.5*    CBG: Recent Labs  Lab 02/03/22 0037 02/03/22 0411 02/03/22 0443 02/03/22 0732 02/03/22 1210  GLUCAP 300* 222* 232* 178* 277*     Recent Results (from the past 240 hour(s))  Surgical PCR screen     Status: None   Collection Time: 01/30/22  6:02 PM   Specimen: Nasal Mucosa; Nasal Swab  Result Value Ref Range Status   MRSA, PCR NEGATIVE NEGATIVE Final   Staphylococcus aureus NEGATIVE NEGATIVE Final    Comment: (NOTE) The Xpert SA Assay (FDA approved for NASAL specimens in patients 69 years of age and older), is one component of a comprehensive surveillance program. It is not intended to diagnose infection nor to guide or monitor treatment. Performed at [facility], [place] [address] [place], [place] [address]          Radiology Studies: DG HIP UNILAT  WITH PELVIS 2-3 VIEWS RIGHT  Result Date: 02/01/2022 CLINICAL DATA:  Intramedullary nail fixation right femur. Intraoperative fluoroscopy. EXAM: DG HIP (WITH OR WITHOUT PELVIS) 2-3V RIGHT COMPARISON:  Right femur radiographs 01/30/2022 FINDINGS: Images were performed intraoperatively without the presence of a radiologist. The patient is undergoing cephalomedullary nail fixation of the previously seen proximal right femoral intertrochanteric fracture. There is improved, near anatomic alignment. Mild displacement of the lesser trochanter. No evidence of hardware complication. Total fluoroscopy images: 7 Total fluoroscopy time: 42 seconds Total dose: Radiation Exposure Index (as provided by the fluoroscopic device): 4.3 mGy air Kerma Please see intraoperative findings for further  detail. IMPRESSION: Intraoperative fluoroscopy during cephalomedullary nail fixation of the proximal right femoral intertrochanteric fracture. Electronically Signed   By: Yvonne Kendall M.D.   On: 02/01/2022 17:38   DG C-Arm 1-60 Min-No Report  Result Date: 02/01/2022 Fluoroscopy was utilized by the requesting physician.  No radiographic interpretation.        Scheduled Meds:  amLODipine  5 mg Oral Daily   aspirin  81 mg Oral BID   docusate sodium  100 mg Oral BID   feeding supplement  237 mL Oral BID BM   ferrous sulfate  325 mg Oral q morning   insulin aspart  0-15 Units Subcutaneous Q4H   insulin aspart  5 Units Subcutaneous TID WC   insulin detemir  12 Units Subcutaneous Daily   multivitamin with minerals  1 tablet Oral Daily   polyethylene glycol  17 g Oral Daily   simvastatin  20 mg Oral QPM   Continuous Infusions:  methocarbamol (ROBAXIN) IV Stopped (02/01/22 1758)   sodium bicarbonate 150 mEq in dextrose 5 % 1,150 mL infusion 75 mL/hr at 02/03/22 1348     LOS: 4 days        Hosie Poisson, MD [facility]   To contact the attending provider between 7A-7P or the covering provider during after  hours 7P-7A, please log into the web site [email or web address] and access using universal [place] password for that web site. If you do not have the password, please call the hospital operator.  02/03/2022, 2:23 PM

## 2022-02-03 NOTE — Progress Notes (Signed)
**Note Jason-Identified via Obfuscation** Patient ID: Jason Mcpherson, male   DOB: 03-15-1941, 81 y.o.   MRN: [id number] Subjective: 2 Days Post-Op Procedure(s) (LRB): INTRAMEDULLARY (IM) NAIL FEMORAL (Right)    Patient reports pain as mild while in bed No events noted Wife in room this a morning with him  Objective:   VITALS:   Vitals:   02/02/22 2208 02/03/22 0453  BP: 123/65 (!) 143/80  Pulse: 75 73  Resp: 17 17  Temp: (!) 97.5 F (36.4 C) 98.3 F (36.8 C)  SpO2: 99% 98%    Neurovascular intact Incision: dressing C/D/I  LABS Recent Labs    02/01/22 0343 02/02/22 0338 02/03/22 0345  HGB 8.7* 7.8* 8.8*  HCT 27.7* 25.3* 28.0*  WBC 13.3* 11.8* 14.3*  PLT 241 232 294    Recent Labs    02/01/22 0343 02/02/22 0338 02/03/22 0345  NA 137 139 136  K 4.6 4.9 5.4*  BUN 87* 93* 48*  CREATININE 2.42* 2.44* 2.75*  GLUCOSE 365* 249* 258*    No results for input(s): "LABPT", "INR" in the last 72 hours.   Assessment/Plan: 2 Days Post-Op Procedure(s) (LRB): INTRAMEDULLARY (IM) NAIL FEMORAL (Right)   Up with therapy Reviewed procedure with his wife and the restrictions with weight bearing PWB RLE RTC in 2 weeks

## 2022-02-04 ENCOUNTER — Inpatient Hospital Stay (HOSPITAL_COMMUNITY): Payer: Medicare HMO

## 2022-02-04 DIAGNOSIS — E119 Type 2 diabetes mellitus without complications: Secondary | ICD-10-CM | POA: Diagnosis not present

## 2022-02-04 DIAGNOSIS — I10 Essential (primary) hypertension: Secondary | ICD-10-CM | POA: Diagnosis not present

## 2022-02-04 DIAGNOSIS — D649 Anemia, unspecified: Secondary | ICD-10-CM | POA: Diagnosis not present

## 2022-02-04 DIAGNOSIS — S72001D Fracture of unspecified part of neck of right femur, subsequent encounter for closed fracture with routine healing: Secondary | ICD-10-CM | POA: Diagnosis not present

## 2022-02-04 LAB — CBC WITH DIFFERENTIAL/PLATELET
Abs Immature Granulocytes: 0.21 10*3/uL — ABNORMAL HIGH (ref 0.00–0.07)
Basophils Absolute: 0 10*3/uL (ref 0.0–0.1)
Basophils Relative: 0 %
Eosinophils Absolute: 0.1 10*3/uL (ref 0.0–0.5)
Eosinophils Relative: 1 %
HCT: 29.8 % — ABNORMAL LOW (ref 39.0–52.0)
Hemoglobin: 9.5 g/dL — ABNORMAL LOW (ref 13.0–17.0)
Immature Granulocytes: 1 %
Lymphocytes Relative: 5 %
Lymphs Abs: 0.8 10*3/uL (ref 0.7–4.0)
MCH: 29.1 pg (ref 26.0–34.0)
MCHC: 31.9 g/dL (ref 30.0–36.0)
MCV: 91.4 fL (ref 80.0–100.0)
Monocytes Absolute: 1.3 10*3/uL — ABNORMAL HIGH (ref 0.1–1.0)
Monocytes Relative: 7 %
Neutro Abs: 15.6 10*3/uL — ABNORMAL HIGH (ref 1.7–7.7)
Neutrophils Relative %: 86 %
Platelets: 333 10*3/uL (ref 150–400)
RBC: 3.26 MIL/uL — ABNORMAL LOW (ref 4.22–5.81)
RDW: 14.8 % (ref 11.5–15.5)
WBC: 18 10*3/uL — ABNORMAL HIGH (ref 4.0–10.5)
nRBC: 0 % (ref 0.0–0.2)

## 2022-02-04 LAB — COMPREHENSIVE METABOLIC PANEL
ALT: 6 U/L (ref 0–44)
AST: 26 U/L (ref 15–41)
Albumin: 2.3 g/dL — ABNORMAL LOW (ref 3.5–5.0)
Alkaline Phosphatase: 54 U/L (ref 38–126)
Anion gap: 9 (ref 5–15)
BUN: 92 mg/dL — ABNORMAL HIGH (ref 8–23)
CO2: 22 mmol/L (ref 22–32)
Calcium: 9.1 mg/dL (ref 8.9–10.3)
Chloride: 108 mmol/L (ref 98–111)
Creatinine, Ser: 2.11 mg/dL — ABNORMAL HIGH (ref 0.61–1.24)
GFR, Estimated: 31 mL/min — ABNORMAL LOW (ref >60)
Glucose, Bld: 120 mg/dL — ABNORMAL HIGH (ref 70–99)
Potassium: 5 mmol/L (ref 3.5–5.1)
Sodium: 139 mmol/L (ref 135–145)
Total Bilirubin: 0.7 mg/dL (ref 0.3–1.2)
Total Protein: 6.2 g/dL — ABNORMAL LOW (ref 6.5–8.1)

## 2022-02-04 LAB — GLUCOSE, CAPILLARY
Glucose-Capillary: 111 mg/dL — ABNORMAL HIGH (ref 70–99)
Glucose-Capillary: 134 mg/dL — ABNORMAL HIGH (ref 70–99)
Glucose-Capillary: 167 mg/dL — ABNORMAL HIGH (ref 70–99)
Glucose-Capillary: 172 mg/dL — ABNORMAL HIGH (ref 70–99)
Glucose-Capillary: 239 mg/dL — ABNORMAL HIGH (ref 70–99)
Glucose-Capillary: 273 mg/dL — ABNORMAL HIGH (ref 70–99)

## 2022-02-04 LAB — URINALYSIS, ROUTINE W REFLEX MICROSCOPIC
Bacteria, UA: NONE SEEN
Bilirubin Urine: NEGATIVE
Glucose, UA: >=500 mg/dL — AB
Hgb urine dipstick: NEGATIVE
Ketones, ur: NEGATIVE mg/dL
Leukocytes,Ua: NEGATIVE
Nitrite: NEGATIVE
Protein, ur: 100 mg/dL — AB
Specific Gravity, Urine: 1.015 (ref 1.005–1.030)
pH: 5 (ref 5.0–8.0)

## 2022-02-04 LAB — PHOSPHORUS: Phosphorus: 2.1 mg/dL — ABNORMAL LOW (ref 2.5–4.6)

## 2022-02-04 LAB — MAGNESIUM: Magnesium: 2 mg/dL (ref 1.7–2.4)

## 2022-02-04 LAB — PROCALCITONIN: Procalcitonin: 0.18 ng/mL

## 2022-02-04 MED ORDER — INSULIN DETEMIR 100 UNIT/ML ~~LOC~~ SOLN
16.0000 [IU] | Freq: Every day | SUBCUTANEOUS | Status: DC
  Administered 2022-02-05 – 2022-02-08 (×4): 16 [IU] via SUBCUTANEOUS
  Filled 2022-02-04 (×4): qty 0.16

## 2022-02-04 MED ORDER — ENSURE MAX PROTEIN PO LIQD
11.0000 [oz_av] | Freq: Every day | ORAL | Status: DC
  Administered 2022-02-04 – 2022-02-05 (×2): 11 [oz_av] via ORAL
  Filled 2022-02-04 (×2): qty 330

## 2022-02-04 MED ORDER — POTASSIUM & SODIUM PHOSPHATES 280-160-250 MG PO PACK
1.0000 | PACK | Freq: Three times a day (TID) | ORAL | Status: DC
  Administered 2022-02-04 – 2022-02-06 (×8): 1 via ORAL
  Filled 2022-02-04 (×9): qty 1

## 2022-02-04 MED ORDER — SODIUM CHLORIDE 0.9 % IV SOLN: INTRAVENOUS | Status: DC

## 2022-02-04 NOTE — Inpatient Diabetes Management (Signed)
Inpatient Diabetes Program Recommendations  AACE/ADA: New Consensus Statement on Inpatient Glycemic Control (2015)  Target Ranges:  Prepandial:   less than 140 mg/dL      Peak postprandial:   less than 180 mg/dL (1-2 hours)      Critically ill patients:  140 - 180 mg/dL   Lab Results  Component Value Date   GLUCAP 167 (H) 02/04/2022   HGBA1C 6.7 (H) 01/30/2022    Review of Glycemic Control  Latest Reference Range & Units 02/03/22 20:42 02/04/22 00:31 02/04/22 04:31 02/04/22 07:47  Glucose-Capillary 70 - 99 mg/dL 236 (H) 134 (H) 111 (H) 167 (H)  (H): Data is abnormally high Diabetes history: Type 2 DM Outpatient Diabetes medications: Amaryl 1.5 mg QD, Farxiga 5 mg QD Current orders for Inpatient glycemic control: Novolog 0-15 units Q4H, Levemir 12 units QAM, Novolog 5 units TID Decadron 10 mg this am x 1; had 4 mg on 02/01/22   Inpatient Diabetes Program Recommendations:     Consider further increasing Levemir to 16 units QD and changing correction to Novolog 0-15 units TID.   Will continue to follow while inpatient.  Thanks, Bronson Curb, MSN, RNC-OB Diabetes Coordinator [phone] (8a-5p)

## 2022-02-04 NOTE — Care Management Important Message (Signed)
Important Message  Patient Details IM Letter placed in Patients room. Name: Jason Mcpherson MRN: [id number] Date of Birth: Jun 20, 1940   Medicare Important Message Given:  Yes     Kerin Salen 02/04/2022, 2:24 PM

## 2022-02-04 NOTE — Progress Notes (Addendum)
PROGRESS NOTE    Jason Mcpherson  WYO:[id number] DOB: Dec 23, 1940 DOA: 01/30/2022 PCP: Doreatha Lew, MD    Chief Complaint  Patient presents with   Fall   Leg Injury    Brief Narrative: Jason Mcpherson is a 81 y.o.WM PMHx  HTN, CKD stage 3a, DM type II without complications, HLD.  Anemia,   Presenting with right hip pain after fall. History per family at bedside. He was walking through the house 2 days ago when he had an unwitnessed fall. X-ray shows right proximal femur fracture.  Assessment & Plan:   Principal Problem:   Closed right hip fracture (HCC) Active Problems:   DM2 (diabetes mellitus, type 2) (HCC)   HTN (hypertension)   Pure hypercholesterolemia   Normocytic anemia   Stage 3a chronic kidney disease (CKD) ([place])   Fall at home, initial encounter   Pressure injury of skin    Communicated right intertrochanteric femur fracture S/p closed reduction with intramedullary nailing of right proximal femur on 02/01/2022 Pain control and therapy evaluation.  AKI superimposed on stage 3 a CKD Suspect from poor oral intake.  Check UA, urine sodium and start him on gentle hydration.  Baseline creatinine appears to be around 2.1.  Creatinine this morning is 2.75   Hyperlipidemia Continue with home regimen    Type 2 diabetes mellitus uncontrolled with hyperglycemia, insulin dependent.  Hemoglobin A1c at 6.7 Continue with sliding scale insulin while inpatient. CBG (last 3)  Recent Labs    02/04/22 0431 02/04/22 0747 02/04/22 1208  GLUCAP 111* 167* 273*   Increased the levemir to 16 units daily and novolog SSI     Hypertension Blood pressure parameters are optimal.  Leukocytosis:  Unclear etiology.  Gently hydrate, check pro calcitonin, UA and CXR.     Acute anemia of blood loss superimposed on anemia of chronic disease Patient underwent 2 units of PRBC transfusion and hemoglobin appears to be stable at this time.  Hyperkalemia:  Lokelma  ordered.  Repeat potassium improved.      Pressure injury present on admission.  Pressure Injury 01/31/22 Buttocks Right Stage 2 -  Partial thickness loss of dermis presenting as a shallow open injury with a red, pink wound bed without slough. (Active)  01/31/22 2030  Location: Buttocks  Location Orientation: Right  Staging: Stage 2 -  Partial thickness loss of dermis presenting as a shallow open injury with a red, pink wound bed without slough.  Wound Description (Comments):   Present on Admission: -- (unknown)     Pressure Injury 01/31/22 Coccyx Right;Left Stage 1 -  Intact skin with non-blanchable redness of a localized area usually over a bony prominence. (Active)  01/31/22 2030  Location: Coccyx  Location Orientation: Right;Left  Staging: Stage 1 -  Intact skin with non-blanchable redness of a localized area usually over a bony prominence.  Wound Description (Comments):   Present on Admission: -- (unknown)   Present on admission. Wound care consulted.  Continue with local wound care.    DVT prophylaxis: scd's Code Status: DNR Family Communication: None at bedside disposition:   Status is: Inpatient Remains inpatient appropriate because:SNF .   Level of care: Med-Surg Consultants:  Orthopedics.   Procedures: S/p closed reduction with intramedullary nailing of right proximal femur on 02/01/2022  Antimicrobials: None   Subjective: No new complaints  Objective: Vitals:   02/03/22 2040 02/04/22 0500 02/04/22 0535 02/04/22 0924  BP: 128/73  (!) 148/95 (!) 140/78  Pulse: 82  84 88  Resp: 17  17 17   Temp: 98.1 F (36.7 C)  98 F (36.7 C) 98.2 F (36.8 C)  TempSrc: Oral  Oral Oral  SpO2: 98%  98% 99%  Weight:  68.5 kg    Height:        Intake/Output Summary (Last 24 hours) at 02/04/2022 1326 Last data filed at 02/04/2022 0859 Gross per 24 hour  Intake 551.55 ml  Output 2350 ml  Net -1798.45 ml    Filed Weights   01/30/22 1546 02/03/22 0311 02/04/22 0500   Weight: 61.3 kg 67.7 kg 68.5 kg    Examination:  General exam: Appears calm and comfortable  Respiratory system: Clear to auscultation. Respiratory effort normal. Cardiovascular system: S1 & S2 heard, RRR. No JVD,  No pedal edema. Gastrointestinal system: Abdomen is nondistended, soft and nontender. No organomegaly or masses felt. Normal bowel sounds heard. Central nervous system: Alert and oriented to person only.  Extremities: Symmetric 5 x 5 power. Skin: No rashes, lesions or ulcers Psychiatry: mood is appropriate.      Data Reviewed: I have personally reviewed following labs and imaging studies  CBC: Recent Labs  Lab 01/30/22 1054 01/31/22 0414 01/31/22 0612 01/31/22 2237 02/01/22 0343 02/02/22 0338 02/03/22 0345 02/04/22 0336  WBC 13.5* 12.3*  --   --  13.3* 11.8* 14.3* 18.0*  NEUTROABS 11.4*  --   --   --  12.1* 10.5* 12.2* 15.6*  HGB 7.2* 6.6*   < > 9.4* 8.7* 7.8* 8.8* 9.5*  HCT 23.1* 21.4*   < > 29.6* 27.7* 25.3* 28.0* 29.8*  MCV 94.3 96.0  --   --  92.3 93.4 93.0 91.4  PLT 248 215  --   --  241 232 294 333   < > = values in this interval not displayed.     Basic Metabolic Panel: Recent Labs  Lab 01/30/22 1054 01/31/22 0414 02/01/22 0343 02/02/22 0338 02/03/22 0345 02/03/22 1845 02/04/22 0336  NA 138   < > 137 139 136 134* 139  K 4.1   < > 4.6 4.9 5.4* 5.2* 5.0  CL 112*   < > 113* 116* 109 107 108  CO2 19*   < > 18* 16* 18* 19* 22  GLUCOSE 204*   < > 365* 249* 258* 328* 120*  BUN 77*   < > 87* 93* 48* 99* 92*  CREATININE 2.16*   < > 2.42* 2.44* 2.75* 2.42* 2.11*  CALCIUM 8.6*   < > 8.6* 8.6* 9.0 8.8* 9.1  MG 2.1  --  2.3 2.1 2.3  --  2.0  PHOS  --   --  3.6 3.0 2.5  --  2.1*   < > = values in this interval not displayed.     GFR: Estimated Creatinine Clearance: 26.1 mL/min (A) (by C-G formula based on SCr of 2.11 mg/dL (H)).  Liver Function Tests: Recent Labs  Lab 01/30/22 1054 02/01/22 0343 02/02/22 0338 02/03/22 0345 02/04/22 0336   AST 14* 12* 13* 17 26  ALT 12 13 11 6 6   ALKPHOS 43 48 39 52 54  BILITOT 0.4 0.7 0.6 0.5 0.7  PROT 6.3* 6.3* 5.7* 6.1* 6.2*  ALBUMIN 2.8* 2.6* 2.3* 2.5* 2.3*     CBG: Recent Labs  Lab 02/03/22 2042 02/04/22 0031 02/04/22 0431 02/04/22 0747 02/04/22 1208  GLUCAP 236* 134* 111* 167* 273*      Recent Results (from the past 240 hour(s))  Surgical PCR screen     Status: None  Collection Time: 01/30/22  6:02 PM   Specimen: Nasal Mucosa; Nasal Swab  Result Value Ref Range Status   MRSA, PCR NEGATIVE NEGATIVE Final   Staphylococcus aureus NEGATIVE NEGATIVE Final    Comment: (NOTE) The Xpert SA Assay (FDA approved for NASAL specimens in patients 30 years of age and older), is one component of a comprehensive surveillance program. It is not intended to diagnose infection nor to guide or monitor treatment. Performed at [facility], [place] [address], [place], [place] [address]          Radiology Studies: No results found.      Scheduled Meds:  amLODipine  5 mg Oral Daily   aspirin  81 mg Oral BID   docusate sodium  100 mg Oral BID   feeding supplement  237 mL Oral BID BM   ferrous sulfate  325 mg Oral q morning   insulin aspart  0-15 Units Subcutaneous Q4H   insulin aspart  5 Units Subcutaneous TID WC   insulin detemir  12 Units Subcutaneous Daily   multivitamin with minerals  1 tablet Oral Daily   polyethylene glycol  17 g Oral Daily   potassium & sodium phosphates  1 packet Oral TID WC & HS   simvastatin  20 mg Oral QPM   Continuous Infusions:  sodium chloride     methocarbamol (ROBAXIN) IV Stopped (02/01/22 1758)     LOS: 5 days        Hosie Poisson, MD [facility]   To contact the attending provider between 7A-7P or the covering provider during after hours 7P-7A, please log into the web site [email or web address] and access using universal [place] password for that web site. If you do not have the password, please call the  hospital operator.  02/04/2022, 1:26 PM

## 2022-02-04 NOTE — Progress Notes (Signed)
Physical Therapy Treatment Patient Details Name: Jason Mcpherson MRN: [id number] DOB: 12-29-1940 Today's Date: 02/04/2022   History of Present Illness 81 y.o. male with medical history significant of HTN, CKD 3a, DM2, HLD, ACDF. Presenting with right hip pain after fall, resulting in R hip fx. orthopedics consulted. pt required preop transfusion, no s/p IM nail on 02/01/22.    PT Comments    POD # 3 pm session Pt tolerated OOB in recliner from 11 am till 2 pm.  With NT, assisted back to bed.  General transfer comment: pt required +2 side by side assist to rise from recliner and lower to bed. General Gait Details: + 2 asisst a few side/pivot steps from recliner back to bed using EVA walker. Positioned in bed to comfort, applied SCD's and ICE. Pt will need ST Rehab at SNF to address mobility and functional decline prior to safely returning home.   Recommendations for follow up therapy are one component of a multi-disciplinary discharge planning process, led by the attending physician.  Recommendations may be updated based on patient status, additional functional criteria and insurance authorization.  Follow Up Recommendations  Skilled nursing-short term rehab (<3 hours/day) Can patient physically be transported by private vehicle: No   Assistance Recommended at Discharge    Patient can return home with the following Help with stairs or ramp for entrance;A lot of help with walking and/or transfers;A lot of help with bathing/dressing/bathroom;Assist for transportation;Assistance with cooking/housework   Equipment Recommendations  None recommended by PT    Recommendations for Other Services       Precautions / Restrictions Precautions Precautions: Fall Restrictions Weight Bearing Restrictions: Yes RLE Weight Bearing: Partial weight bearing RLE Partial Weight Bearing Percentage or Pounds: 50%     Mobility  Bed Mobility Overal bed mobility: Needs Assistance Bed Mobility: Sit to  Supine     Supine to sit: Max assist Sit to supine: Total assist, Max assist, +2 for physical assistance, +2 for safety/equipment   General bed mobility comments: Required + 2 asisst back to bed    Transfers Overall transfer level: Needs assistance Equipment used: 2 person hand held assist Transfers: Bed to chair/wheelchair/BSC, Sit to/from Stand Sit to Stand: Mod assist, Max assist, +2 physical assistance, +2 safety/equipment           General transfer comment: pt required +2 side by side assist to rise from recliner and lower to bed.    Ambulation/Gait Ambulation/Gait assistance: Max assist, +2 physical assistance, +2 safety/equipment Gait Distance (Feet): 2 Feet Assistive device: Ethelene Hal Gait Pattern/deviations: Step-to pattern, Decreased stance time - right Gait velocity: decreased     General Gait Details: + 2 asisst a few side/pivot steps from recliner back to bed using EVA walker.   Stairs             Wheelchair Mobility    Modified Rankin (Stroke Patients Only)       Balance                                            Cognition Arousal/Alertness: Awake/alert Behavior During Therapy: WFL for tasks assessed/performed Overall Cognitive Status: Within Functional Limits for tasks assessed                                 General Comments: AxO  x 3 VERY HOH.        Exercises      General Comments        Pertinent Vitals/Pain Pain Assessment Pain Assessment: Faces Faces Pain Scale: Hurts even more Pain Location: right hip with activity Pain Descriptors / Indicators: Guarding, Grimacing, Sore, Operative site guarding Pain Intervention(s): Monitored during session, Premedicated before session, Repositioned, Ice applied    Home Living                          Prior Function            PT Goals (current goals can now be found in the care plan section) Progress towards PT goals: Progressing  toward goals    Frequency    Min 3X/week      PT Plan Current plan remains appropriate    Co-evaluation              AM-PAC PT "6 Clicks" Mobility   Outcome Measure  Help needed turning from your back to your side while in a flat bed without using bedrails?: A Lot Help needed moving from lying on your back to sitting on the side of a flat bed without using bedrails?: A Lot Help needed moving to and from a bed to a chair (including a wheelchair)?: A Lot Help needed standing up from a chair using your arms (e.g., wheelchair or bedside chair)?: A Lot Help needed to walk in hospital room?: A Lot Help needed climbing 3-5 steps with a railing? : A Lot 6 Click Score: 12    End of Session Equipment Utilized During Treatment: Gait belt Activity Tolerance: No increased pain;Treatment limited secondary to agitation Patient left: in bed;with call bell/phone within reach;with bed alarm set Nurse Communication: Mobility status PT Visit Diagnosis: Unsteadiness on feet (R26.81);Difficulty in walking, not elsewhere classified (R26.2)     Time: 1350-1405 PT Time Calculation (min) (ACUTE ONLY): 15 min  Charges:  $Gait Training: 8-22 mins $Therapeutic Activity: 8-22 mins                     Rica Koyanagi  PTA Acute  Rehabilitation Services Office M-F          [phone] Weekend pager [phone]

## 2022-02-04 NOTE — TOC Progression Note (Signed)
Transition of Care [facility]) - Progression Note    Patient Details  Name: Jason Mcpherson MRN: [id number] Date of Birth: 10/04/1940  Transition of [facility]) CM/SW Contact  Lennart Pall, [place] Phone Number: 02/04/2022, 10:51 AM  Clinical Narrative:     Have reviewed SNF bed offers with pt/ wife and they have accepted bed at [facility].  Insurance authorization has begun.  Will keep team posted.  Expected Discharge Plan: Home/Self Care Barriers to Discharge: Continued Medical Work up  Expected Discharge Plan and Services Expected Discharge Plan: Home/Self Care   Discharge Planning Services: CM Consult   Living arrangements for the past 2 months: Single Family Home                                       Social Determinants of Health (SDOH) Interventions    Readmission Risk Interventions    01/31/2022    2:47 PM  Readmission Risk Prevention Plan  Transportation Screening Complete  PCP or Specialist Appt within 5-7 Days Complete  Home Care Screening Complete  Medication Review (RN CM) Complete

## 2022-02-04 NOTE — Progress Notes (Signed)
Physical Therapy Treatment Patient Details Name: Jason Mcpherson MRN: [id number] DOB: 12-08-1940 Today's Date: 02/04/2022   History of Present Illness 81 y.o. male with medical history significant of HTN, CKD 3a, DM2, HLD, ACDF. Presenting with right hip pain after fall, resulting in R hip fx. orthopedics consulted. pt required preop transfusion, no s/p IM nail on 02/01/22.    PT Comments    POD # 3 Pt is AxO x 3 very smart but VERY HOH.  Spouse at bedside.  Assisted OOB to amb was difficult.  General bed mobility comments: using bed pad to complete scooting to EOB pt <15% sel assist.  Pt unable to self move R LE.  General transfer comment: pt required +2 side by side assist to partially rise from elevated bed. General Gait Details: pt was unable to weight shift/advance either LE with traditional walker even with + 2 side by side assist.  So used B platform EVA walker.  Distance limited by pain/effort of 8 feet.  Spouse assisted by following with recliner. Positioned in recliner to comfort.  Applied ICE. Pt will need ST Rehab at SNF to address mobility and functional decline prior to safely returning home.   Recommendations for follow up therapy are one component of a multi-disciplinary discharge planning process, led by the attending physician.  Recommendations may be updated based on patient status, additional functional criteria and insurance authorization.  Follow Up Recommendations  Skilled nursing-short term rehab (<3 hours/day) Can patient physically be transported by private vehicle: No   Assistance Recommended at Discharge    Patient can return home with the following Help with stairs or ramp for entrance;A lot of help with walking and/or transfers;A lot of help with bathing/dressing/bathroom;Assist for transportation;Assistance with cooking/housework   Equipment Recommendations  None recommended by PT    Recommendations for Other Services       Precautions / Restrictions  Precautions Precautions: Fall Restrictions Weight Bearing Restrictions: No RLE Weight Bearing: Partial weight bearing     Mobility  Bed Mobility Overal bed mobility: Needs Assistance Bed Mobility: Supine to Sit     Supine to sit: Max assist     General bed mobility comments: using bed pad to complete scooting to EOB pt <15% sel assist    Transfers Overall transfer level: Needs assistance Equipment used: Rolling walker (2 wheels) Transfers: Sit to/from Stand Sit to Stand: Mod assist, Max assist, +2 physical assistance, +2 safety/equipment           General transfer comment: pt required +2 side by side assist to partially rise from elevated bed.    Ambulation/Gait Ambulation/Gait assistance: Max assist, +2 physical assistance, +2 safety/equipment Gait Distance (Feet): 8 Feet Assistive device: Ethelene Hal Gait Pattern/deviations: Step-to pattern, Decreased stance time - right Gait velocity: decreased     General Gait Details: pt was unable to weight shift/advance either LE with traditional walker even with + 2 side by side assist.  So used B platform EVA walker.  Distance limited by pain/effort of 8 feet.  Spouse assisted by following with recliner.   Stairs             Wheelchair Mobility    Modified Rankin (Stroke Patients Only)       Balance  Cognition Arousal/Alertness: Awake/alert Behavior During Therapy: WFL for tasks assessed/performed Overall Cognitive Status: Within Functional Limits for tasks assessed                                 General Comments: AxO x 3 VERY HOH.        Exercises      General Comments        Pertinent Vitals/Pain Pain Assessment Pain Assessment: Faces Faces Pain Scale: Hurts a little bit Pain Location: right hip with activity Pain Descriptors / Indicators: Guarding, Grimacing, Sore Pain Intervention(s): Monitored during session,  Premedicated before session, Repositioned, Ice applied    Home Living                          Prior Function            PT Goals (current goals can now be found in the care plan section) Progress towards PT goals: Progressing toward goals    Frequency    Min 3X/week      PT Plan Current plan remains appropriate    Co-evaluation              AM-PAC PT "6 Clicks" Mobility   Outcome Measure  Help needed turning from your back to your side while in a flat bed without using bedrails?: A Lot Help needed moving from lying on your back to sitting on the side of a flat bed without using bedrails?: A Lot Help needed moving to and from a bed to a chair (including a wheelchair)?: A Lot Help needed standing up from a chair using your arms (e.g., wheelchair or bedside chair)?: A Lot Help needed to walk in hospital room?: A Lot Help needed climbing 3-5 steps with a railing? : A Lot 6 Click Score: 12    End of Session Equipment Utilized During Treatment: Gait belt Activity Tolerance: No increased pain;Treatment limited secondary to agitation Patient left: in chair;with call bell/phone within reach;with chair alarm set Nurse Communication: Mobility status PT Visit Diagnosis: Unsteadiness on feet (R26.81);Difficulty in walking, not elsewhere classified (R26.2)     Time: 1035-1100 PT Time Calculation (min) (ACUTE ONLY): 25 min  Charges:  $Gait Training: 8-22 mins $Therapeutic Activity: 8-22 mins                     Rica Koyanagi  PTA Acute  Rehabilitation Services Office M-F          [phone] Weekend pager [phone]

## 2022-02-04 NOTE — Progress Notes (Signed)
Nutrition Follow-up  DOCUMENTATION CODES:   Non-severe (moderate) malnutrition in context of chronic illness  INTERVENTION:   -D/c Ensure Plus High Protein  -Ensure MAX Protein po BID, each supplement provides 150 kcal and 30 grams of protein   -Multivitamin with minerals daily  NEW NUTRITION DIAGNOSIS:   Moderate Malnutrition related to chronic illness (CKD) as evidenced by moderate fat depletion, moderate muscle depletion.  GOAL:   Patient will meet greater than or equal to 90% of their needs  Progressing.  MONITOR:   PO intake, Supplement acceptance, Labs, Weight trends  REASON FOR ASSESSMENT:   Consult Assessment of nutrition requirement/status  ASSESSMENT:   80 y.o. male with medical history of HTN, stage 3 CKD, type 2 DM, HLD, and arthritis. He presented to the ED due to R hip pain after a fall two days prior. He was able to stand on his own without pain or difficulty that day but then next day, the day PTA, he began having pain and difficulty mobilizing with his walker.  10/2: s/p Closed reduction, intramedullary nailing of right proximal femur  Patient in room, sleeping, no visitors at bedside. Pt HOH but eventually did wake up. Pt reports he is eating but does not like certain items from the menu. States he could not chew his [place] with lunch. Ate his breakfast with no problem. Pt was provided Ensure initially following surgery but states he has not been given the supplement for 2 days now.  Pt was placed on carb modified diet. CBGs still ranging in the 200s.  Will order Ensure Max for additional protein, pt wants chocolate flavor.   Admission weight: 135 lbs Current weight: 151 lbs  Medications: Colace, Ferrous sulfate, Multivitamin with minerals daily, Miralax, PHOS-NAK, Lokelma  Labs reviewed: CBGs: 111-273  Low Phos  NUTRITION - FOCUSED PHYSICAL EXAM:  Flowsheet Row Most Recent Value  Orbital Region Mild depletion  Upper Arm Region Moderate  depletion  Thoracic and Lumbar Region Unable to assess  Buccal Region Moderate depletion  Temple Region Moderate depletion  Clavicle Bone Region Moderate depletion  Clavicle and Acromion Bone Region Moderate depletion  Scapular Bone Region Moderate depletion  Dorsal Hand Moderate depletion  Patellar Region Unable to assess  Anterior Thigh Region Unable to assess  Posterior Calf Region Unable to assess  Edema (RD Assessment) None  Hair Reviewed  Eyes Reviewed  Mouth Reviewed  Skin Reviewed       Diet Order:   Diet Order             Diet Carb Modified Fluid consistency: Thin; Room service appropriate? Yes  Diet effective now                   EDUCATION NEEDS:   No education needs have been identified at this time  Skin:  Skin Assessment: Skin Integrity Issues: Skin Integrity Issues:: Stage II, Stage I, Incisions Incisions: 10/2: right hip,  pressure injury to back -per [place] note  Last BM:  10/5 -type 3  Height:   Ht Readings from Last 1 Encounters:  01/30/22 5\' 8"  (1.727 m)    Weight:   Wt Readings from Last 1 Encounters:  02/04/22 68.5 kg    Ideal Body Weight:  70 kg  BMI:  Body mass index is 22.96 kg/m.  Estimated Nutritional Needs:   Kcal:  1700-1900 kcal  Protein:  85-100 grams  Fluid:  >/= 1.8 L/day  Clayton Bibles, MS, RD, LDN Inpatient Clinical Dietitian Contact information available via  Amion

## 2022-02-04 NOTE — Consult Note (Addendum)
WOC Nurse Consult Note: Patient receiving care in [place] [facility].  Reason for Consult: "pressure injury on the back" Wound type: I spoke with primary RN, Jovita Kussmaul via telephone. She has not seen the patient's buttocks today. The flowsheet indicates skin related issues on the buttocks and coccyx.  He is currently in the chair. She has agreed to look at the buttocks (not the "back") and evaluate the area and respond by Secure Chat to let me know her findings.  I explained that a stage 2 (which is what is documented on the flowsheet for the right buttock) would look like a blister, or a blister that the skin has peeled off of.  If adequate off loading has occurred, the stage 1 to the coccyx should no longer be detectable. Val Riles, RN, MSN, CWOCN, CNS-BC, pager [phone]

## 2022-02-05 ENCOUNTER — Inpatient Hospital Stay (HOSPITAL_COMMUNITY): Payer: Medicare HMO

## 2022-02-05 ENCOUNTER — Other Ambulatory Visit (HOSPITAL_COMMUNITY): Payer: Self-pay

## 2022-02-05 ENCOUNTER — Inpatient Hospital Stay (HOSPITAL_BASED_OUTPATIENT_CLINIC_OR_DEPARTMENT_OTHER): Payer: Medicare HMO

## 2022-02-05 ENCOUNTER — Telehealth (HOSPITAL_COMMUNITY): Payer: Self-pay | Admitting: Pharmacy Technician

## 2022-02-05 DIAGNOSIS — M7989 Other specified soft tissue disorders: Secondary | ICD-10-CM | POA: Diagnosis not present

## 2022-02-05 DIAGNOSIS — E119 Type 2 diabetes mellitus without complications: Secondary | ICD-10-CM | POA: Diagnosis not present

## 2022-02-05 DIAGNOSIS — E440 Moderate protein-calorie malnutrition: Secondary | ICD-10-CM | POA: Insufficient documentation

## 2022-02-05 DIAGNOSIS — I10 Essential (primary) hypertension: Secondary | ICD-10-CM | POA: Diagnosis not present

## 2022-02-05 DIAGNOSIS — D649 Anemia, unspecified: Secondary | ICD-10-CM | POA: Diagnosis not present

## 2022-02-05 DIAGNOSIS — S72001D Fracture of unspecified part of neck of right femur, subsequent encounter for closed fracture with routine healing: Secondary | ICD-10-CM | POA: Diagnosis not present

## 2022-02-05 LAB — CBC WITH DIFFERENTIAL/PLATELET
Abs Immature Granulocytes: 0.2 10*3/uL — ABNORMAL HIGH (ref 0.00–0.07)
Basophils Absolute: 0 10*3/uL (ref 0.0–0.1)
Basophils Relative: 0 %
Eosinophils Absolute: 0 10*3/uL (ref 0.0–0.5)
Eosinophils Relative: 0 %
HCT: 29.2 % — ABNORMAL LOW (ref 39.0–52.0)
Hemoglobin: 9.3 g/dL — ABNORMAL LOW (ref 13.0–17.0)
Immature Granulocytes: 1 %
Lymphocytes Relative: 3 %
Lymphs Abs: 0.7 10*3/uL (ref 0.7–4.0)
MCH: 29.4 pg (ref 26.0–34.0)
MCHC: 31.8 g/dL (ref 30.0–36.0)
MCV: 92.4 fL (ref 80.0–100.0)
Monocytes Absolute: 1.4 10*3/uL — ABNORMAL HIGH (ref 0.1–1.0)
Monocytes Relative: 7 %
Neutro Abs: 18.8 10*3/uL — ABNORMAL HIGH (ref 1.7–7.7)
Neutrophils Relative %: 89 %
Platelets: 325 10*3/uL (ref 150–400)
RBC: 3.16 MIL/uL — ABNORMAL LOW (ref 4.22–5.81)
RDW: 14.7 % (ref 11.5–15.5)
WBC: 21.2 10*3/uL — ABNORMAL HIGH (ref 4.0–10.5)
nRBC: 0 % (ref 0.0–0.2)

## 2022-02-05 LAB — COMPREHENSIVE METABOLIC PANEL
ALT: 6 U/L (ref 0–44)
AST: 19 U/L (ref 15–41)
Albumin: 2.3 g/dL — ABNORMAL LOW (ref 3.5–5.0)
Alkaline Phosphatase: 52 U/L (ref 38–126)
Anion gap: 7 (ref 5–15)
BUN: 85 mg/dL — ABNORMAL HIGH (ref 8–23)
CO2: 23 mmol/L (ref 22–32)
Calcium: 8.4 mg/dL — ABNORMAL LOW (ref 8.9–10.3)
Chloride: 104 mmol/L (ref 98–111)
Creatinine, Ser: 2.09 mg/dL — ABNORMAL HIGH (ref 0.61–1.24)
GFR, Estimated: 31 mL/min — ABNORMAL LOW (ref >60)
Glucose, Bld: 145 mg/dL — ABNORMAL HIGH (ref 70–99)
Potassium: 5.1 mmol/L (ref 3.5–5.1)
Sodium: 134 mmol/L — ABNORMAL LOW (ref 135–145)
Total Bilirubin: 0.6 mg/dL (ref 0.3–1.2)
Total Protein: 5.9 g/dL — ABNORMAL LOW (ref 6.5–8.1)

## 2022-02-05 LAB — GLUCOSE, CAPILLARY
Glucose-Capillary: 119 mg/dL — ABNORMAL HIGH (ref 70–99)
Glucose-Capillary: 132 mg/dL — ABNORMAL HIGH (ref 70–99)
Glucose-Capillary: 138 mg/dL — ABNORMAL HIGH (ref 70–99)
Glucose-Capillary: 144 mg/dL — ABNORMAL HIGH (ref 70–99)
Glucose-Capillary: 148 mg/dL — ABNORMAL HIGH (ref 70–99)
Glucose-Capillary: 172 mg/dL — ABNORMAL HIGH (ref 70–99)
Glucose-Capillary: 222 mg/dL — ABNORMAL HIGH (ref 70–99)

## 2022-02-05 LAB — MAGNESIUM: Magnesium: 2 mg/dL (ref 1.7–2.4)

## 2022-02-05 LAB — PHOSPHORUS: Phosphorus: 2.9 mg/dL (ref 2.5–4.6)

## 2022-02-05 MED ORDER — APIXABAN 5 MG PO TABS: 5.0000 mg | ORAL_TABLET | Freq: Two times a day (BID) | ORAL | Status: DC

## 2022-02-05 MED ORDER — SODIUM ZIRCONIUM CYCLOSILICATE 10 G PO PACK
10.0000 g | PACK | Freq: Two times a day (BID) | ORAL | Status: AC
  Administered 2022-02-05 – 2022-02-06 (×2): 10 g via ORAL
  Filled 2022-02-05 (×2): qty 1

## 2022-02-05 MED ORDER — ENSURE MAX PROTEIN PO LIQD
11.0000 [oz_av] | Freq: Two times a day (BID) | ORAL | Status: DC
  Administered 2022-02-05 – 2022-02-07 (×5): 11 [oz_av] via ORAL
  Filled 2022-02-05 (×6): qty 330

## 2022-02-05 MED ORDER — MEDIHONEY WOUND/BURN DRESSING EX PSTE
1.0000 | PASTE | Freq: Every day | CUTANEOUS | Status: DC
  Filled 2022-02-05: qty 44

## 2022-02-05 MED ORDER — APIXABAN 5 MG PO TABS
10.0000 mg | ORAL_TABLET | Freq: Two times a day (BID) | ORAL | Status: DC
  Administered 2022-02-05 – 2022-02-08 (×6): 10 mg via ORAL
  Filled 2022-02-05 (×8): qty 2

## 2022-02-05 NOTE — Consult Note (Addendum)
WOC Nurse Consult Note: Consult requested for bilat buttocks.  Pt was noted to have a Stage 2 to right buttock and a Stage 1 to the right buttock which were present on admission, according to the nursing wound flow sheet.  Appearance is more consistent with dark red-purple Deep tissue pressure injuries to bilat buttcks, evolving to Unstageable with loose yellow slough. Left 3X4cm, Right 3X1.5cm Pressure Injury POA: Yes Dressing procedure/placement/frequency: topical treatment orders provided for bedside nurses to perfom as follows to assist with removal of nonviable tissue: Apply Medihoney to left and right buttock wounds Q day, then cover with foam dressing.  (Change foam dressing Q 3 days or PRN soiling.) Please re-consult if further assistance is needed.  Thank-you,  Julien Girt MSN, [place], [place], [place], [place]

## 2022-02-05 NOTE — Telephone Encounter (Signed)
Pharmacy Patient Advocate Encounter  Insurance verification completed.    The patient is insured through [employer] Medicare Part D   The patient is currently admitted and ran test claims for the following: Eliquis .  Copays and coinsurance results were relayed to Inpatient clinical team.  

## 2022-02-05 NOTE — Progress Notes (Signed)
Nutrition Follow-up  DOCUMENTATION CODES:   Non-severe (moderate) malnutrition in context of chronic illness  INTERVENTION:   -Ensure MAX Protein po BID, each supplement provides 150 kcal and 30 grams of protein   -Multivitamin with minerals daily  NUTRITION DIAGNOSIS:   Moderate Malnutrition related to chronic illness (CKD) as evidenced by moderate fat depletion, moderate muscle depletion.  Ongoing.  GOAL:   Patient will meet greater than or equal to 90% of their needs  Not meeting.  MONITOR:   PO intake, Supplement acceptance, Labs, Weight trends  REASON FOR ASSESSMENT:   Consult Assessment of nutrition requirement/status  ASSESSMENT:   81 y.o. male with medical history of HTN, stage 3 CKD, type 2 DM, HLD, and arthritis. He presented to the ED due to R hip pain after a fall two days prior. He was able to stand on his own without pain or difficulty that day but then next day, the day PTA, he began having pain and difficulty mobilizing with his walker.  10/2: s/p Closed reduction, intramedullary nailing of right proximal femur  Patient vomiting today with increased stomach distention.  Pt was ordered Ensure Max, resume once pt able to tolerate diet. Consumed 75% of breakfast and 25% of lunch today.  Admission weight: 135 lbs Current weight: 155 lbs  Medications: Colace, Ferrous sulfate, Multivitamin with minerals daily, Miralax, PHOS-NAK, Zofran  Labs reviewed: CBGs: 119-222 Low Na  Diet Order:   Diet Order             Diet Carb Modified Fluid consistency: Thin; Room service appropriate? Yes  Diet effective now                   EDUCATION NEEDS:   No education needs have been identified at this time  Skin:  Skin Assessment: Skin Integrity Issues: Skin Integrity Issues:: Stage II, Stage I, Incisions, DTI DTI: right coccyx, right buttocks Incisions: 10/2: right hip  Last BM:  10/5 -type 3  Height:   Ht Readings from Last 1 Encounters:   01/30/22 5\' 8"  (1.727 m)    Weight:   Wt Readings from Last 1 Encounters:  02/05/22 70.4 kg    Ideal Body Weight:  70 kg  BMI:  Body mass index is 23.6 kg/m.  Estimated Nutritional Needs:   Kcal:  1700-1900 kcal  Protein:  85-100 grams  Fluid:  >/= 1.8 L/day  Clayton Bibles, MS, RD, LDN Inpatient Clinical Dietitian Contact information available via Amion

## 2022-02-05 NOTE — Progress Notes (Signed)
PROGRESS NOTE    Jason Mcpherson  QVZ:[id number] DOB: 06-14-1940 DOA: 01/30/2022 PCP: Doreatha Lew, MD    Chief Complaint  Patient presents with   Fall   Leg Injury    Brief Narrative: Jason Mcpherson is a 81 y.o.WM PMHx  HTN, CKD stage 3a, DM type II without complications, HLD.  Anemia, Presenting with right hip pain after fall. History per family at bedside. He was walking through the house 2 days ago when he had an unwitnessed fall. X-ray shows right proximal femur fracture. Orthopedics consulted and patient underwent closed reduction with intramedullary nailing of right proximal femur.  Over the last 48 hours patient's wbc count continues to worsen, unable to find a source of infection.  Venous duplex of the lower extremity ordered, showed bilateral DVT. He was started on eliquis and aspirin discontinued.   Assessment & Plan:   Principal Problem:   Closed right hip fracture (HCC) Active Problems:   DM2 (diabetes mellitus, type 2) (HCC)   HTN (hypertension)   Pure hypercholesterolemia   Normocytic anemia   Stage 3a chronic kidney disease (CKD) ([place])   Fall at home, initial encounter   Pressure injury of skin   Malnutrition of moderate degree    Communicated right intertrochanteric femur fracture S/p closed reduction with intramedullary nailing of right proximal femur on 02/01/2022 Pain control and therapy evaluation. Therapy eval recommended SNF. Currently waiting for SNF placement.   AKI superimposed on stage 3 a CKD Suspect from poor oral intake.  Baseline creatinine appears to be around 2.1. creatinine peaked to 2.75.  Started on gentle hydration. Creatinine improved to 2.09.  UA is negative for infection.    Hyperlipidemia Continue with home regimen    Type 2 diabetes mellitus uncontrolled with hyperglycemia, insulin dependent.  Hemoglobin A1c at 6.7 Continue with sliding scale insulin while inpatient. CBG (last 3)  Recent Labs    02/05/22 0414  02/05/22 0742 02/05/22 1202  GLUCAP 119* 172* 222*    Increased the levemir to 16 units daily and novolog SSI. Novolog 5 units TIDAC.      Hypertension Blood pressure parameters are well controlled.   Leukocytosis:  Unclear etiology.  Suspect reactive.  Gently hydrate, pro calcitonin is 0.18 . UA and CXR is negative.  Venous duplex of the lower extremities ordered , showed bilateral DVT, started him on eliquis.  Discussed the plan with patient's family.     Acute anemia of blood loss superimposed on anemia of chronic disease Patient underwent 2 units of PRBC transfusion and hemoglobin appears to be stable at this time at 9.   Hyperkalemia:  Lokelma ordered.  Repeat potassium improved.  Recheck BMP in am.      Pressure injury present on admission.  Pressure Injury 01/31/22 Buttocks Right Deep Tissue Pressure Injury - Purple or maroon localized area of discolored intact skin or blood-filled blister due to damage of underlying soft tissue from pressure and/or shear. DTPI evolving to Unstageable (Active)  01/31/22 2030  Location: Buttocks  Location Orientation: Right  Staging: Deep Tissue Pressure Injury - Purple or maroon localized area of discolored intact skin or blood-filled blister due to damage of underlying soft tissue from pressure and/or shear.  Wound Description (Comments): DTPI evolving to Unstageable  Present on Admission: Yes (unknown)     Pressure Injury 01/31/22 Coccyx Right;Left Deep Tissue Pressure Injury - Purple or maroon localized area of discolored intact skin or blood-filled blister due to damage of underlying soft tissue from pressure  and/or shear. DTPI evolving to unstageable (Active)  01/31/22 2030  Location: Coccyx  Location Orientation: Right;Left  Staging: Deep Tissue Pressure Injury - Purple or maroon localized area of discolored intact skin or blood-filled blister due to damage of underlying soft tissue from pressure and/or shear.  Wound  Description (Comments): DTPI evolving to unstageable  Present on Admission: Yes (unknown)   Present on admission. Wound care consulted.  Continue with local wound care.    DVT prophylaxis:  eliquis.  Code Status: DNR Family Communication: None at bedside  disposition:   Status is: Inpatient Remains inpatient appropriate because:SNF .   Level of care: Med-Surg Consultants:  Orthopedics.   Procedures: S/p closed reduction with intramedullary nailing of right proximal femur on 02/01/2022  Antimicrobials: None   Subjective: BM this morning. No new complaints.   Objective: Vitals:   02/05/22 0400 02/05/22 0528 02/05/22 0924 02/05/22 1330  BP:  (!) 135/90 128/69 135/68  Pulse:  90 88 87  Resp:  17  18  Temp:  97.9 F (36.6 C)  98.2 F (36.8 C)  TempSrc:    Oral  SpO2:  99%  97%  Weight: 70.4 kg     Height:        Intake/Output Summary (Last 24 hours) at 02/05/2022 1454 Last data filed at 02/05/2022 1238 Gross per 24 hour  Intake 1581.26 ml  Output 2300 ml  Net -718.74 ml    Filed Weights   02/03/22 0311 02/04/22 0500 02/05/22 0400  Weight: 67.7 kg 68.5 kg 70.4 kg    Examination:  General exam: Appears calm and comfortable  Respiratory system: Clear to auscultation. Respiratory effort normal. Cardiovascular system: S1 & S2 heard, RRR. No JVD, No pedal edema. Gastrointestinal system: Abdomen is nondistended, soft and nontender. Normal bowel sounds heard. Central nervous system: Alert and oriented to person and place only.  Extremities: Symmetric 5 x 5 power. Skin: pressure injury on the back.  Psychiatry: Mood & affect appropriate.       Data Reviewed: I have personally reviewed following labs and imaging studies  CBC: Recent Labs  Lab 02/01/22 0343 02/02/22 0338 02/03/22 0345 02/04/22 0336 02/05/22 0331  WBC 13.3* 11.8* 14.3* 18.0* 21.2*  NEUTROABS 12.1* 10.5* 12.2* 15.6* 18.8*  HGB 8.7* 7.8* 8.8* 9.5* 9.3*  HCT 27.7* 25.3* 28.0* 29.8* 29.2*   MCV 92.3 93.4 93.0 91.4 92.4  PLT 241 232 294 333 325     Basic Metabolic Panel: Recent Labs  Lab 02/01/22 0343 02/02/22 0338 02/03/22 0345 02/03/22 1845 02/04/22 0336 02/05/22 0331  NA 137 139 136 134* 139 134*  K 4.6 4.9 5.4* 5.2* 5.0 5.1  CL 113* 116* 109 107 108 104  CO2 18* 16* 18* 19* 22 23  GLUCOSE 365* 249* 258* 328* 120* 145*  BUN 87* 93* 48* 99* 92* 85*  CREATININE 2.42* 2.44* 2.75* 2.42* 2.11* 2.09*  CALCIUM 8.6* 8.6* 9.0 8.8* 9.1 8.4*  MG 2.3 2.1 2.3  --  2.0 2.0  PHOS 3.6 3.0 2.5  --  2.1* 2.9     GFR: Estimated Creatinine Clearance: 26.4 mL/min (A) (by C-G formula based on SCr of 2.09 mg/dL (H)).  Liver Function Tests: Recent Labs  Lab 02/01/22 0343 02/02/22 0338 02/03/22 0345 02/04/22 0336 02/05/22 0331  AST 12* 13* 17 26 19   ALT 13 11 6 6 6   ALKPHOS 48 39 52 54 52  BILITOT 0.7 0.6 0.5 0.7 0.6  PROT 6.3* 5.7* 6.1* 6.2* 5.9*  ALBUMIN 2.6* 2.3* 2.5*  2.3* 2.3*     CBG: Recent Labs  Lab 02/04/22 2014 02/05/22 0003 02/05/22 0414 02/05/22 0742 02/05/22 1202  GLUCAP 172* 132* 119* 172* 222*      Recent Results (from the past 240 hour(s))  Surgical PCR screen     Status: None   Collection Time: 01/30/22  6:02 PM   Specimen: Nasal Mucosa; Nasal Swab  Result Value Ref Range Status   MRSA, PCR NEGATIVE NEGATIVE Final   Staphylococcus aureus NEGATIVE NEGATIVE Final    Comment: (NOTE) The Xpert SA Assay (FDA approved for NASAL specimens in patients 70 years of age and older), is one component of a comprehensive surveillance program. It is not intended to diagnose infection nor to guide or monitor treatment. Performed at [facility], [place] [address], [place], [place] [address]          Radiology Studies: DG Abd Portable 1V  Result Date: 02/05/2022 CLINICAL DATA:  Vomiting.  Recent right femoral ORIF. EXAM: PORTABLE ABDOMEN - 1 VIEW COMPARISON:  None Available. FINDINGS: There is moderate gaseous distension of  the stomach. A moderate amount of stool is present in the colon. No dilated loops of bowel are seen to suggest obstruction. Lumbar spine fusion and right femoral ORIF are noted. IMPRESSION: Moderate gaseous distension of the stomach and moderate colonic stool burden. No evidence of bowel obstruction. Electronically Signed   By: Logan Bores M.D.   On: 02/05/2022 13:52   VAS [place] LOWER EXTREMITY VENOUS (DVT)  Result Date: 02/05/2022  Lower Venous DVT Study Patient Name:  Jason Mcpherson  Date of Exam:   02/05/2022 Medical Rec #: [id number]         Accession #:    [id number] Date of Birth: May 06, 1940         Patient Gender: M Patient Age:   51 years Exam Location:  [facility] Procedure:      VAS [place] LOWER EXTREMITY VENOUS (DVT) Referring Phys: Hosie Poisson --------------------------------------------------------------------------------  Indications: Swelling.  Risk Factors: Trauma. Limitations: Poor ultrasound/tissue interface and patient immobility, patient positioning. Comparison Study: No prior studies. Performing Technologist: Oliver Hum RVT  Examination Guidelines: A complete evaluation includes B-mode imaging, spectral Doppler, color Doppler, and power Doppler as needed of all accessible portions of each vessel. Bilateral testing is considered an integral part of a complete examination. Limited examinations for reoccurring indications may be performed as noted. The reflux portion of the exam is performed with the patient in reverse Trendelenburg.  +---------+---------------+---------+-----------+----------+-------------------+ RIGHT    CompressibilityPhasicitySpontaneityPropertiesThrombus Aging      +---------+---------------+---------+-----------+----------+-------------------+ CFV      Full           Yes      Yes                                      +---------+---------------+---------+-----------+----------+-------------------+ SFJ      Full                                                              +---------+---------------+---------+-----------+----------+-------------------+ FV Prox  Full                                                             +---------+---------------+---------+-----------+----------+-------------------+  FV Mid   Full                                                             +---------+---------------+---------+-----------+----------+-------------------+ FV DistalFull                                                             +---------+---------------+---------+-----------+----------+-------------------+ PFV      Full                                                             +---------+---------------+---------+-----------+----------+-------------------+ POP      Full           Yes      Yes                                      +---------+---------------+---------+-----------+----------+-------------------+ PTV      Full                                                             +---------+---------------+---------+-----------+----------+-------------------+ PERO                                                  Not well visualized +---------+---------------+---------+-----------+----------+-------------------+ Gastroc  Partial                                      Acute               +---------+---------------+---------+-----------+----------+-------------------+   +---------+---------------+---------+-----------+----------+--------------+ LEFT     CompressibilityPhasicitySpontaneityPropertiesThrombus Aging +---------+---------------+---------+-----------+----------+--------------+ CFV      None           No       No                   Acute          +---------+---------------+---------+-----------+----------+--------------+ SFJ      None                                         Acute          +---------+---------------+---------+-----------+----------+--------------+ FV Prox   Full                                                        +---------+---------------+---------+-----------+----------+--------------+  FV Mid   Full                                                        +---------+---------------+---------+-----------+----------+--------------+ FV DistalFull                                                        +---------+---------------+---------+-----------+----------+--------------+ PFV      None           No       No                   Acute          +---------+---------------+---------+-----------+----------+--------------+ POP      Full           Yes      Yes                                 +---------+---------------+---------+-----------+----------+--------------+ PTV      Full                                                        +---------+---------------+---------+-----------+----------+--------------+ PERO     Full                                                        +---------+---------------+---------+-----------+----------+--------------+ EIV                     Yes      Yes                                 +---------+---------------+---------+-----------+----------+--------------+    Summary: RIGHT: - Findings consistent with acute deep vein thrombosis involving the right gastrocnemius veins. - No cystic structure found in the popliteal fossa.  LEFT: - Findings consistent with acute deep vein thrombosis involving the left common femoral vein, and left proximal profunda vein. - No cystic structure found in the popliteal fossa.  *See table(s) above for measurements and observations.    Preliminary    DG CHEST PORT 1 VIEW  Result Date: 02/04/2022 CLINICAL DATA:  Cough EXAM: PORTABLE CHEST 1 VIEW COMPARISON:  01/30/2022 FINDINGS: Cardiac shadow is within normal limits. Aortic calcifications are seen. The lungs are well aerated bilaterally. Minimal right basilar atelectasis is seen. No sizable effusion is  noted. No acute bony abnormality is seen. IMPRESSION: Mild right basilar atelectasis. Electronically Signed   By: Inez Catalina M.D.   On: 02/04/2022 20:13        Scheduled Meds:  amLODipine  5 mg Oral Daily   apixaban  10 mg Oral BID   Followed by   Derrill Memo ON 02/12/2022] apixaban  5 mg  Oral BID   docusate sodium  100 mg Oral BID   ferrous sulfate  325 mg Oral q morning   insulin aspart  0-15 Units Subcutaneous Q4H   insulin aspart  5 Units Subcutaneous TID WC   insulin detemir  16 Units Subcutaneous Daily   leptospermum manuka honey  1 Application Topical Daily   multivitamin with minerals  1 tablet Oral Daily   polyethylene glycol  17 g Oral Daily   potassium & sodium phosphates  1 packet Oral TID WC & HS   Ensure Max Protein  11 oz Oral BID   simvastatin  20 mg Oral QPM   Continuous Infusions:  sodium chloride 75 mL/hr at 02/05/22 1227   methocarbamol (ROBAXIN) IV Stopped (02/01/22 1758)     LOS: 6 days        Hosie Poisson, MD [facility]   To contact the attending provider between 7A-7P or the covering provider during after hours 7P-7A, please log into the web site [email or web address] and access using universal [place] password for that web site. If you do not have the password, please call the hospital operator.  02/05/2022, 2:54 PM

## 2022-02-05 NOTE — TOC Progression Note (Signed)
Transition of Care [facility]) - Progression Note    Patient Details  Name: Jason Mcpherson MRN: [id number] Date of Birth: 11/18/1940  [facility]) CM/SW Contact  Lennart Pall, [place] Phone Number: 02/05/2022, 1:28 PM  Clinical Narrative:     Have received SNF approval for [facility] (approved 10/5 - 10/12, next review 34/91, certification#  [id number]) and have informed facility and pt's wife.  Unfortunately, pt now with medical concerns and not cleared for dc to SNF.  TOC will continue to follow along.   Expected Discharge Plan: Home/Self Care Barriers to Discharge: Continued Medical Work up  Expected Discharge Plan and Services Expected Discharge Plan: Home/Self Care   Discharge Planning Services: CM Consult   Living arrangements for the past 2 months: Single Family Home                                       Social Determinants of Health (SDOH) Interventions    Readmission Risk Interventions    01/31/2022    2:47 PM  Readmission Risk Prevention Plan  Transportation Screening Complete  PCP or Specialist Appt within 5-7 Days Complete  Home Care Screening Complete  Medication Review (RN CM) Complete

## 2022-02-05 NOTE — Progress Notes (Signed)
Bilateral lower extremity venous duplex has been completed. Preliminary results can be found in CV Proc through chart review.  Results were given to Dr. Karleen Hampshire.  02/05/22 12:06 PM Carlos Levering RVT

## 2022-02-05 NOTE — Plan of Care (Signed)
  Problem: Education: Goal: Knowledge of General Education information will improve Description Including pain rating scale, medication(s)/side effects and non-pharmacologic comfort measures Outcome: Progressing   Problem: Health Behavior/Discharge Planning: Goal: Ability to manage health-related needs will improve Outcome: Progressing   

## 2022-02-05 NOTE — Progress Notes (Signed)
Pharmacy - Eliquis  Assessment: 77 yoM on ASA 81 mg bid s/p IM nailing, now found to have bilateral DVTs; Pharmacy to dose Eliquis  Plan: Eliquis 10 mg PO bid x 7d, then 5 mg PO bid thereafter Pharmacy to check copay and provide patient education/coupons prior to discharge Will continue to follow peripherally  Reuel Boom, PharmD, BCPS [phone] 02/05/2022, 1:14 PM

## 2022-02-05 NOTE — TOC Benefit Eligibility Note (Signed)
Patient Teacher, English as a foreign language completed.    The patient is currently admitted and upon discharge could be taking Eliquis 5 mg.  The current 30 day co-pay is $151.46 due to being in Coverage Gap (donut hole).   The patient is insured through [place], [place] Patient Advocate Specialist [place] Patient Advocate Team Direct Number: [phone]  Fax: [phone]

## 2022-02-06 DIAGNOSIS — S72001D Fracture of unspecified part of neck of right femur, subsequent encounter for closed fracture with routine healing: Secondary | ICD-10-CM | POA: Diagnosis not present

## 2022-02-06 DIAGNOSIS — D649 Anemia, unspecified: Secondary | ICD-10-CM | POA: Diagnosis not present

## 2022-02-06 DIAGNOSIS — I10 Essential (primary) hypertension: Secondary | ICD-10-CM | POA: Diagnosis not present

## 2022-02-06 DIAGNOSIS — E119 Type 2 diabetes mellitus without complications: Secondary | ICD-10-CM | POA: Diagnosis not present

## 2022-02-06 LAB — BASIC METABOLIC PANEL
Anion gap: 6 (ref 5–15)
BUN: 81 mg/dL — ABNORMAL HIGH (ref 8–23)
CO2: 22 mmol/L (ref 22–32)
Calcium: 8 mg/dL — ABNORMAL LOW (ref 8.9–10.3)
Chloride: 108 mmol/L (ref 98–111)
Creatinine, Ser: 1.91 mg/dL — ABNORMAL HIGH (ref 0.61–1.24)
GFR, Estimated: 35 mL/min — ABNORMAL LOW (ref >60)
Glucose, Bld: 137 mg/dL — ABNORMAL HIGH (ref 70–99)
Potassium: 4.7 mmol/L (ref 3.5–5.1)
Sodium: 136 mmol/L (ref 135–145)

## 2022-02-06 LAB — CBC WITH DIFFERENTIAL/PLATELET
Abs Immature Granulocytes: 0.12 10*3/uL — ABNORMAL HIGH (ref 0.00–0.07)
Basophils Absolute: 0 10*3/uL (ref 0.0–0.1)
Basophils Relative: 0 %
Eosinophils Absolute: 0.2 10*3/uL (ref 0.0–0.5)
Eosinophils Relative: 2 %
HCT: 24.4 % — ABNORMAL LOW (ref 39.0–52.0)
Hemoglobin: 7.3 g/dL — ABNORMAL LOW (ref 13.0–17.0)
Immature Granulocytes: 1 %
Lymphocytes Relative: 6 %
Lymphs Abs: 0.8 10*3/uL (ref 0.7–4.0)
MCH: 29 pg (ref 26.0–34.0)
MCHC: 29.9 g/dL — ABNORMAL LOW (ref 30.0–36.0)
MCV: 96.8 fL (ref 80.0–100.0)
Monocytes Absolute: 1.2 10*3/uL — ABNORMAL HIGH (ref 0.1–1.0)
Monocytes Relative: 8 %
Neutro Abs: 12.4 10*3/uL — ABNORMAL HIGH (ref 1.7–7.7)
Neutrophils Relative %: 83 %
Platelets: 318 10*3/uL (ref 150–400)
RBC: 2.52 MIL/uL — ABNORMAL LOW (ref 4.22–5.81)
RDW: 14.8 % (ref 11.5–15.5)
WBC: 14.8 10*3/uL — ABNORMAL HIGH (ref 4.0–10.5)
nRBC: 0 % (ref 0.0–0.2)

## 2022-02-06 LAB — MAGNESIUM: Magnesium: 2.1 mg/dL (ref 1.7–2.4)

## 2022-02-06 LAB — GLUCOSE, CAPILLARY
Glucose-Capillary: 129 mg/dL — ABNORMAL HIGH (ref 70–99)
Glucose-Capillary: 179 mg/dL — ABNORMAL HIGH (ref 70–99)
Glucose-Capillary: 168 mg/dL — ABNORMAL HIGH (ref 70–99)
Glucose-Capillary: 106 mg/dL — ABNORMAL HIGH (ref 70–99)
Glucose-Capillary: 88 mg/dL (ref 70–99)

## 2022-02-06 LAB — PHOSPHORUS: Phosphorus: 4.1 mg/dL (ref 2.5–4.6)

## 2022-02-06 NOTE — Plan of Care (Signed)
  Problem: Education: Goal: Knowledge of General Education information will improve Description Including pain rating scale, medication(s)/side effects and non-pharmacologic comfort measures Outcome: Progressing   

## 2022-02-06 NOTE — TOC Progression Note (Addendum)
Transition of Care [facility]) - Progression Note    Patient Details  Name: Jason Mcpherson MRN: [id number] Date of Birth: February 13, 1941  [facility]) CM/SW Contact  Henrietta Dine, RN Phone Number: 02/06/2022, 12:42 PM  Clinical Narrative: pt ready for d/c;   attempted to contact [facility] at [phone] spoke to [place], and she says no one is there who can accept the pt today; LVM  for Christine at [phone]; also LVM for Jone Baseman, Central Intake at [place] - received call from Irving Shows, Central Intake [phone]) she says the pt can be admitted to [facility] today; call report # [phone]; will also get room # at that time; awaiting speech eval prior to d/c.  [phone] - Christine Ows, Central Intake notified of need for speech eval prior to d/c; she says pt can be admitted tomorrow if unable today.    Expected Discharge Plan: Home/Self Care Barriers to Discharge: Continued Medical Work up  Expected Discharge Plan and Services Expected Discharge Plan: Home/Self Care   Discharge Planning Services: CM Consult   Living arrangements for the past 2 months: Single Family Home                                       Social Determinants of Health (SDOH) Interventions    Readmission Risk Interventions    01/31/2022    2:47 PM  Readmission Risk Prevention Plan  Transportation Screening Complete  PCP or Specialist Appt within 5-7 Days Complete  Home Care Screening Complete  Medication Review (RN CM) Complete

## 2022-02-06 NOTE — Progress Notes (Signed)
PROGRESS NOTE    Jason Mcpherson  PIR:[id number] DOB: [birth year] DOA: 01/30/2022 PCP: Doreatha Lew, MD    Chief Complaint  Patient presents with   Fall   Leg Injury    Brief Narrative: Jason Mcpherson is a 81 y.o.WM PMHx  HTN, CKD stage 3a, DM type II without complications, HLD.  Anemia, Presenting with right hip pain after fall. History per family at bedside. He was walking through the house 2 days ago when he had an unwitnessed fall. X-ray shows right proximal femur fracture. Orthopedics consulted and patient underwent closed reduction with intramedullary nailing of right proximal femur.  Over the last 48 hours patient's wbc count continues to worsen, unable to find a source of infection.  Venous duplex of the lower extremity ordered, showed bilateral DVT. He was started on eliquis and aspirin discontinued.   Pt seen and examined at bedside. RN reports that he had trouble swallowing. SLP evaluation.   Assessment & Plan:   Principal Problem:   Closed right hip fracture (HCC) Active Problems:   DM2 (diabetes mellitus, type 2) (HCC)   HTN (hypertension)   Pure hypercholesterolemia   Normocytic anemia   Stage 3a chronic kidney disease (CKD) ([place])   Fall at home, initial encounter   Pressure injury of skin   Malnutrition of moderate degree    Communicated right intertrochanteric femur fracture S/p closed reduction with intramedullary nailing of right proximal femur on 02/01/2022 Pain control and therapy evaluation. Therapy eval recommended SNF. Currently waiting for SNF placement.   AKI superimposed on stage 3 a CKD Suspect from poor oral intake.  Baseline creatinine appears to be around 2.1. creatinine peaked to 2.75.  Started on gentle hydration. Creatinine improved to 2.09. to 1.9 with IV fluids.    UA is negative for infection.    Hyperlipidemia Continue with home regimen    Type 2 diabetes mellitus uncontrolled with hyperglycemia, insulin dependent.   Hemoglobin A1c at 6.7 Continue with sliding scale insulin while inpatient. CBG (last 3)  Recent Labs    02/06/22 0335 02/06/22 0731 02/06/22 1158  GLUCAP 129* 179* 168*    Increased the levemir to 16 units daily and novolog SSI. Novolog 5 units TIDAC.  No changes in meds      Hypertension Blood pressure parameters are well controlled.   Leukocytosis:  Unclear etiology.  Suspect reactive.  Gently hydrate, pro calcitonin is 0.18 . UA and CXR is negative.  Venous duplex of the lower extremities ordered , showed bilateral DVT, started him on eliquis.  Discussed the plan with patient's family.     Acute anemia of blood loss superimposed on anemia of chronic disease Patient underwent 2 units of PRBC transfusion .  Hemoglobin around post transfusion is around 9 , dropped to 7.3 today. ? Suspect hemodilution.  Recheck hemoglobin in am.  Transfuse to keep hemoglobin greater than 7.    Hyperkalemia:  Lokelma ordered.  Resolved.   RN reports trouble swallowing for the patient.  SLP evaluation ordered and pending.   Pressure injury present on admission.  Pressure Injury 01/31/22 Buttocks Right Deep Tissue Pressure Injury - Purple or maroon localized area of discolored intact skin or blood-filled blister due to damage of underlying soft tissue from pressure and/or shear. DTPI evolving to Unstageable (Active)  01/31/22 2030  Location: Buttocks  Location Orientation: Right  Staging: Deep Tissue Pressure Injury - Purple or maroon localized area of discolored intact skin or blood-filled blister due to damage of underlying soft  tissue from pressure and/or shear.  Wound Description (Comments): DTPI evolving to Unstageable  Present on Admission: Yes (unknown)     Pressure Injury 01/31/22 Coccyx Right;Left Deep Tissue Pressure Injury - Purple or maroon localized area of discolored intact skin or blood-filled blister due to damage of underlying soft tissue from pressure and/or shear.  DTPI evolving to unstageable (Active)  01/31/22 2030  Location: Coccyx  Location Orientation: Right;Left  Staging: Deep Tissue Pressure Injury - Purple or maroon localized area of discolored intact skin or blood-filled blister due to damage of underlying soft tissue from pressure and/or shear.  Wound Description (Comments): DTPI evolving to unstageable  Present on Admission: Yes (unknown)   Present on admission. Wound care consulted.  Continue with local wound care.   Vomiting yesterday.  Abd x ray shows mod constipation.  2 BM in the last 24 hours.    DVT prophylaxis:  eliquis.  Code Status: DNR Family Communication: family at bedside.  disposition:   Status is: Inpatient Remains inpatient appropriate because:SNF .   Level of care: Med-Surg Consultants:  Orthopedics.   Procedures: S/p closed reduction with intramedullary nailing of right proximal femur on 02/01/2022  Antimicrobials: None   Subjective: No new complaints.   Objective: Vitals:   02/06/22 0423 02/06/22 0442 02/06/22 0933 02/06/22 1331  BP:  115/67 127/67 123/70  Pulse:  83 85 87  Resp:  20  16  Temp:  98.4 F (36.9 C)  98.4 F (36.9 C)  TempSrc:  Oral  Oral  SpO2:  97%  97%  Weight: 69.8 kg 67.6 kg    Height:        Intake/Output Summary (Last 24 hours) at 02/06/2022 1431 Last data filed at 02/06/2022 1231 Gross per 24 hour  Intake 2725.69 ml  Output 1950 ml  Net 775.69 ml    Filed Weights   02/05/22 0400 02/06/22 0423 02/06/22 0442  Weight: 70.4 kg 69.8 kg 67.6 kg    Examination:  General exam: Appears calm and comfortable  Respiratory system: Clear to auscultation. Respiratory effort normal. Cardiovascular system: S1 & S2 heard, RRR. No JVD,  No pedal edema. Gastrointestinal system: Abdomen is nondistended, soft and nontender.  Normal bowel sounds heard. Central nervous system: Alert and oriented to person only.  Extremities: no pedal edema.  Skin: pressure injury on the  back. Psychiatry:  Mood is appropriate.       Data Reviewed: I have personally reviewed following labs and imaging studies  CBC: Recent Labs  Lab 02/02/22 0338 02/03/22 0345 02/04/22 0336 02/05/22 0331 02/06/22 0322  WBC 11.8* 14.3* 18.0* 21.2* 14.8*  NEUTROABS 10.5* 12.2* 15.6* 18.8* 12.4*  HGB 7.8* 8.8* 9.5* 9.3* 7.3*  HCT 25.3* 28.0* 29.8* 29.2* 24.4*  MCV 93.4 93.0 91.4 92.4 96.8  PLT 232 294 333 325 318     Basic Metabolic Panel: Recent Labs  Lab 02/02/22 0338 02/03/22 0345 02/03/22 1845 02/04/22 0336 02/05/22 0331 02/06/22 0322  NA 139 136 134* 139 134* 136  K 4.9 5.4* 5.2* 5.0 5.1 4.7  CL 116* 109 107 108 104 108  CO2 16* 18* 19* 22 23 22   GLUCOSE 249* 258* 328* 120* 145* 137*  BUN 93* 48* 99* 92* 85* 81*  CREATININE 2.44* 2.75* 2.42* 2.11* 2.09* 1.91*  CALCIUM 8.6* 9.0 8.8* 9.1 8.4* 8.0*  MG 2.1 2.3  --  2.0 2.0 2.1  PHOS 3.0 2.5  --  2.1* 2.9 4.1     GFR: Estimated Creatinine Clearance: 28.5 mL/min (A) (  by C-G formula based on SCr of 1.91 mg/dL (H)).  Liver Function Tests: Recent Labs  Lab 02/01/22 0343 02/02/22 0338 02/03/22 0345 02/04/22 0336 02/05/22 0331  AST 12* 13* 17 26 19   ALT 13 11 6 6 6   ALKPHOS 48 39 52 54 52  BILITOT 0.7 0.6 0.5 0.7 0.6  PROT 6.3* 5.7* 6.1* 6.2* 5.9*  ALBUMIN 2.6* 2.3* 2.5* 2.3* 2.3*     CBG: Recent Labs  Lab 02/05/22 2016 02/05/22 2349 02/06/22 0335 02/06/22 0731 02/06/22 1158  GLUCAP 144* 148* 129* 179* 168*      Recent Results (from the past 240 hour(s))  Surgical PCR screen     Status: None   Collection Time: 01/30/22  6:02 PM   Specimen: Nasal Mucosa; Nasal Swab  Result Value Ref Range Status   MRSA, PCR NEGATIVE NEGATIVE Final   Staphylococcus aureus NEGATIVE NEGATIVE Final    Comment: (NOTE) The Xpert SA Assay (FDA approved for NASAL specimens in patients 65 years of age and older), is one component of a comprehensive surveillance program. It is not intended to diagnose infection  nor to guide or monitor treatment. Performed at [facility], [place] [address], [place], [place] [address]   Culture, blood (Routine X 2) w Reflex to ID Panel     Status: None (Preliminary result)   Collection Time: 02/05/22 11:43 AM   Specimen: BLOOD  Result Value Ref Range Status   Specimen Description   Final    BLOOD BLOOD LEFT ARM Performed at [facility] [address] [place], [place] [address]    Special Requests   Final    BOTTLES DRAWN AEROBIC ONLY Blood Culture adequate volume Performed at [facility] [address] [place], [place] [address]    Culture   Final    NO GROWTH < 24 HOURS Performed at [facility] [address], [place], [place] [address]    Report Status PENDING  Incomplete  Culture, blood (Routine X 2) w Reflex to ID Panel     Status: None (Preliminary result)   Collection Time: 02/05/22 11:43 AM   Specimen: BLOOD  Result Value Ref Range Status   Specimen Description   Final    BLOOD BLOOD RIGHT ARM Performed at [facility] [address] [place], [place] [address]    Special Requests   Final    BOTTLES DRAWN AEROBIC ONLY Blood Culture adequate volume Performed at [facility] [address] [place], [place] [address]    Culture   Final    NO GROWTH < 24 HOURS Performed at [facility] [address] [place], [place] [address]    Report Status PENDING  Incomplete         Radiology Studies: VAS [place] LOWER EXTREMITY VENOUS (DVT)  Result Date: 02/06/2022  Lower Venous DVT Study Patient Name:  NORMAND DAMRON  Date of Exam:   02/05/2022 Medical Rec #: [id number]         Accession #:    [id number] Date of Birth: [birth year]         Patient Gender: M Patient Age:   81 years Exam Location:  [facility] Procedure:      VAS [place] LOWER EXTREMITY VENOUS (DVT) Referring Phys: Hosie Poisson  --------------------------------------------------------------------------------  Indications: Swelling.  Risk Factors: Trauma. Limitations: Poor ultrasound/tissue interface and patient immobility, patient positioning. Comparison Study: No prior studies. Performing Technologist: Oliver Hum RVT  Examination Guidelines: A complete evaluation includes B-mode imaging, spectral Doppler, color Doppler, and power Doppler as needed of all accessible portions of each vessel. Bilateral testing is considered an integral part of a complete examination. Limited examinations for reoccurring indications may be performed as noted. The reflux portion of the exam is performed with the patient in reverse Trendelenburg.  +---------+---------------+---------+-----------+----------+-------------------+ RIGHT    CompressibilityPhasicitySpontaneityPropertiesThrombus Aging      +---------+---------------+---------+-----------+----------+-------------------+ CFV      Full           Yes      Yes                                      +---------+---------------+---------+-----------+----------+-------------------+ SFJ      Full                                                             +---------+---------------+---------+-----------+----------+-------------------+ FV Prox  Full                                                             +---------+---------------+---------+-----------+----------+-------------------+ FV Mid   Full                                                             +---------+---------------+---------+-----------+----------+-------------------+ FV DistalFull                                                             +---------+---------------+---------+-----------+----------+-------------------+ PFV      Full                                                             +---------+---------------+---------+-----------+----------+-------------------+ POP      Full            Yes      Yes                                      +---------+---------------+---------+-----------+----------+-------------------+ PTV      Full                                                             +---------+---------------+---------+-----------+----------+-------------------+ PERO  Not well visualized +---------+---------------+---------+-----------+----------+-------------------+ Gastroc  Partial                                      Acute               +---------+---------------+---------+-----------+----------+-------------------+   +---------+---------------+---------+-----------+----------+--------------+ LEFT     CompressibilityPhasicitySpontaneityPropertiesThrombus Aging +---------+---------------+---------+-----------+----------+--------------+ CFV      None           No       No                   Acute          +---------+---------------+---------+-----------+----------+--------------+ SFJ      None                                         Acute          +---------+---------------+---------+-----------+----------+--------------+ FV Prox  Full                                                        +---------+---------------+---------+-----------+----------+--------------+ FV Mid   Full                                                        +---------+---------------+---------+-----------+----------+--------------+ FV DistalFull                                                        +---------+---------------+---------+-----------+----------+--------------+ PFV      None           No       No                   Acute          +---------+---------------+---------+-----------+----------+--------------+ POP      Full           Yes      Yes                                 +---------+---------------+---------+-----------+----------+--------------+ PTV      Full                                                         +---------+---------------+---------+-----------+----------+--------------+ PERO     Full                                                        +---------+---------------+---------+-----------+----------+--------------+ EIV  Yes      Yes                                 +---------+---------------+---------+-----------+----------+--------------+     Summary: RIGHT: - Findings consistent with acute deep vein thrombosis involving the right gastrocnemius veins. - No cystic structure found in the popliteal fossa.  LEFT: - Findings consistent with acute deep vein thrombosis involving the left common femoral vein, and left proximal profunda vein. - No cystic structure found in the popliteal fossa.  *See table(s) above for measurements and observations. Electronically signed by Orlie Pollen on 02/06/2022 at 11:11:54 AM.    Final    DG Abd Portable 1V  Result Date: 02/05/2022 CLINICAL DATA:  Vomiting.  Recent right femoral ORIF. EXAM: PORTABLE ABDOMEN - 1 VIEW COMPARISON:  None Available. FINDINGS: There is moderate gaseous distension of the stomach. A moderate amount of stool is present in the colon. No dilated loops of bowel are seen to suggest obstruction. Lumbar spine fusion and right femoral ORIF are noted. IMPRESSION: Moderate gaseous distension of the stomach and moderate colonic stool burden. No evidence of bowel obstruction. Electronically Signed   By: Logan Bores M.D.   On: 02/05/2022 13:52   DG CHEST PORT 1 VIEW  Result Date: 02/04/2022 CLINICAL DATA:  Cough EXAM: PORTABLE CHEST 1 VIEW COMPARISON:  01/30/2022 FINDINGS: Cardiac shadow is within normal limits. Aortic calcifications are seen. The lungs are well aerated bilaterally. Minimal right basilar atelectasis is seen. No sizable effusion is noted. No acute bony abnormality is seen. IMPRESSION: Mild right basilar atelectasis. Electronically Signed   By: Inez Catalina M.D.    On: 02/04/2022 20:13        Scheduled Meds:  amLODipine  5 mg Oral Daily   apixaban  10 mg Oral BID   Followed by   Derrill Memo ON 02/12/2022] apixaban  5 mg Oral BID   docusate sodium  100 mg Oral BID   ferrous sulfate  325 mg Oral q morning   insulin aspart  0-15 Units Subcutaneous Q4H   insulin aspart  5 Units Subcutaneous TID WC   insulin detemir  16 Units Subcutaneous Daily   leptospermum manuka honey  1 Application Topical Daily   multivitamin with minerals  1 tablet Oral Daily   polyethylene glycol  17 g Oral Daily   potassium & sodium phosphates  1 packet Oral TID WC & HS   Ensure Max Protein  11 oz Oral BID   simvastatin  20 mg Oral QPM   Continuous Infusions:  sodium chloride 75 mL/hr at 02/06/22 0327   methocarbamol (ROBAXIN) IV Stopped (02/01/22 1758)     LOS: 7 days        Hosie Poisson, MD [facility]   To contact the attending provider between 7A-7P or the covering provider during after hours 7P-7A, please log into the web site [email or web address] and access using universal [place] password for that web site. If you do not have the password, please call the hospital operator.  02/06/2022, 2:31 PM

## 2022-02-07 DIAGNOSIS — I10 Essential (primary) hypertension: Secondary | ICD-10-CM | POA: Diagnosis not present

## 2022-02-07 DIAGNOSIS — E119 Type 2 diabetes mellitus without complications: Secondary | ICD-10-CM | POA: Diagnosis not present

## 2022-02-07 DIAGNOSIS — S72001D Fracture of unspecified part of neck of right femur, subsequent encounter for closed fracture with routine healing: Secondary | ICD-10-CM | POA: Diagnosis not present

## 2022-02-07 LAB — GLUCOSE, CAPILLARY
Glucose-Capillary: 120 mg/dL — ABNORMAL HIGH (ref 70–99)
Glucose-Capillary: 120 mg/dL — ABNORMAL HIGH (ref 70–99)
Glucose-Capillary: 160 mg/dL — ABNORMAL HIGH (ref 70–99)
Glucose-Capillary: 162 mg/dL — ABNORMAL HIGH (ref 70–99)
Glucose-Capillary: 165 mg/dL — ABNORMAL HIGH (ref 70–99)
Glucose-Capillary: 174 mg/dL — ABNORMAL HIGH (ref 70–99)
Glucose-Capillary: 77 mg/dL (ref 70–99)

## 2022-02-07 LAB — CBC WITH DIFFERENTIAL/PLATELET
Abs Immature Granulocytes: 0.19 10*3/uL — ABNORMAL HIGH (ref 0.00–0.07)
Basophils Absolute: 0 10*3/uL (ref 0.0–0.1)
Basophils Relative: 0 %
Eosinophils Absolute: 0.5 10*3/uL (ref 0.0–0.5)
Eosinophils Relative: 3 %
HCT: 25.8 % — ABNORMAL LOW (ref 39.0–52.0)
Hemoglobin: 7.8 g/dL — ABNORMAL LOW (ref 13.0–17.0)
Immature Granulocytes: 1 %
Lymphocytes Relative: 7 %
Lymphs Abs: 1 10*3/uL (ref 0.7–4.0)
MCH: 28.9 pg (ref 26.0–34.0)
MCHC: 30.2 g/dL (ref 30.0–36.0)
MCV: 95.6 fL (ref 80.0–100.0)
Monocytes Absolute: 1.3 10*3/uL — ABNORMAL HIGH (ref 0.1–1.0)
Monocytes Relative: 9 %
Neutro Abs: 11.9 10*3/uL — ABNORMAL HIGH (ref 1.7–7.7)
Neutrophils Relative %: 80 %
Platelets: 380 10*3/uL (ref 150–400)
RBC: 2.7 MIL/uL — ABNORMAL LOW (ref 4.22–5.81)
RDW: 14.8 % (ref 11.5–15.5)
WBC: 14.8 10*3/uL — ABNORMAL HIGH (ref 4.0–10.5)
nRBC: 0 % (ref 0.0–0.2)

## 2022-02-07 MED ORDER — ADULT MULTIVITAMIN W/MINERALS CH: 1.0000 | ORAL_TABLET | Freq: Every day | ORAL | Status: DC

## 2022-02-07 MED ORDER — BISACODYL 10 MG RE SUPP: 10.0000 mg | Freq: Every day | RECTAL | 0 refills | Status: DC | PRN

## 2022-02-07 MED ORDER — ENSURE MAX PROTEIN PO LIQD: 11.0000 [oz_av] | Freq: Two times a day (BID) | ORAL | 11 refills | Status: DC

## 2022-02-07 MED ORDER — POLYETHYLENE GLYCOL 3350 17 G PO PACK: 17.0000 g | PACK | Freq: Every day | ORAL | 0 refills | Status: DC | PRN

## 2022-02-07 MED ORDER — DOCUSATE SODIUM 100 MG PO CAPS: 100.0000 mg | ORAL_CAPSULE | Freq: Two times a day (BID) | ORAL | 0 refills | Status: DC

## 2022-02-07 MED ORDER — APIXABAN 5 MG PO TABS: 5.0000 mg | ORAL_TABLET | Freq: Two times a day (BID) | ORAL | 2 refills | Status: DC

## 2022-02-07 MED ORDER — APIXABAN 5 MG PO TABS: 10.0000 mg | ORAL_TABLET | Freq: Two times a day (BID) | ORAL | 0 refills | Status: DC

## 2022-02-07 NOTE — TOC Progression Note (Signed)
Transition of Care [facility]) - Progression Note    Patient Details  Name: EINER MEALS MRN: [id number] Date of Birth: December 24, 1940  [facility] CM/SW Contact  Henrietta Dine, RN Phone Number: 02/07/2022, 3:23 PM  Clinical Narrative:    Awaiting eval by speech therapy; TOC will con't to follow.   Expected Discharge Plan: Home/Self Care Barriers to Discharge: Continued Medical Work up  Expected Discharge Plan and Services Expected Discharge Plan: Home/Self Care   Discharge Planning Services: CM Consult   Living arrangements for the past 2 months: Single Family Home Expected Discharge Date: 02/07/22                                     Social Determinants of Health (SDOH) Interventions    Readmission Risk Interventions    01/31/2022    2:47 PM  Readmission Risk Prevention Plan  Transportation Screening Complete  PCP or Specialist Appt within 5-7 Days Complete  Home Care Screening Complete  Medication Review (RN CM) Complete

## 2022-02-07 NOTE — Evaluation (Signed)
Clinical/Bedside Swallow Evaluation Patient Details  Name: Jason Mcpherson MRN: [id number] Date of Birth: 03-30-1941  Today's Date: 02/07/2022 Time: SLP Start Time (ACUTE ONLY): 6629 SLP Stop Time (ACUTE ONLY): 4765 SLP Time Calculation (min) (ACUTE ONLY): 29 min  Past Medical History:  Past Medical History:  Diagnosis Date   Anemia    Arthritis    KNEES & BACK   Cellulitis 08/2013   RT LEG   Chronic kidney disease 08/2013   ACUTE RENAL    Dehydration    Diabetes mellitus without complication ([place])    Hyperkalemia 08/2013   Hyperlipemia    Hypertension    Past Surgical History:  Past Surgical History:  Procedure Laterality Date   ANTERIOR CERVICAL DECOMP/DISCECTOMY FUSION N/A 10/17/2012   Procedure: ANTERIOR CERVICAL DECOMPRESSION/DISCECTOMY FUSION 2 LEVELS;  Surgeon: Charlie Pitter, MD;  Location: [facility];  Service: Neurosurgery;  Laterality: N/A;  Cervical three-four, four-five anterior cervical decompression fusion with allograft plating   BACK SURGERY     10 years ago   [place] IM NAIL Right 02/01/2022   Procedure: INTRAMEDULLARY (IM) NAIL FEMORAL;  Surgeon: Paralee Cancel, MD;  Location: [facility] ORS;  Service: Orthopedics;  Laterality: Right;   HPI:  81 y.o. male, very HOH (no hearing aids available)with medical history significant for HTN, CKD 3a, DM2, HLD, ACDF. Presenting with right hip pain after fall, resulting in R hip fx. Orthopedics consulted. Pt required preop transfusion, now s/p IM nail on 02/01/22.    Assessment / Plan / Recommendation  Clinical Impression  Mr. Eckardt presents with s/s of an esophageal dysphagia with reports of globus and regurgitation.  He was observed to feed himself crackers, applesauce, and drank thin liquids with initial c/o food/liquids "sticking," but as session progressed he stated they were "moving down better."  He demonstrated occasional belching and one incident of mild coughing at end of session.  As this clinician  documented outside his room after study, he continued to eat peanut butter and crackers and drank more thin liquids with no further s/s of difficulty.  No indications of an oropharyngeal dysphagia; no concerns for aspiration (CXR 10/5 showed lungs are well aerated bilaterally).  Recommend continuing current diet; pt may benefit from an esophagram as an OP.  No further SLP f/u needed. D/W RN. SLP Visit Diagnosis: Dysphagia, unspecified (R13.10)    Aspiration Risk  No limitations    Diet Recommendation   Regular solids, thin liquids  Medication Administration: Whole meds with liquid (If coughs, give with puree)    Other  Recommendations Recommended Consults: Consider esophageal assessment Oral Care Recommendations: Oral care BID    Recommendations for follow up therapy are one component of a multi-disciplinary discharge planning process, led by the attending physician.  Recommendations may be updated based on patient status, additional functional criteria and insurance authorization.  Follow up Recommendations No SLP follow up        Swallow Study   General HPI: 82 y.o. male, very HOH (no hearing aids available)with medical history significant for HTN, CKD 3a, DM2, HLD, ACDF. Presenting with right hip pain after fall, resulting in R hip fx. Orthopedics consulted. Pt required preop transfusion, now s/p IM nail on 02/01/22. Type of Study: Bedside Swallow Evaluation Previous Swallow Assessment: no Diet Prior to this Study: Regular;Thin liquids Temperature Spikes Noted: No Respiratory Status: Room air History of Recent Intubation: No Behavior/Cognition: Alert;Cooperative Oral Cavity Assessment: Within Functional Limits Oral Care Completed by SLP:  No Oral Cavity - Dentition: Dentures, top;Dentures, bottom Vision: Functional for self-feeding Self-Feeding Abilities: Able to feed self Patient Positioning: Upright in bed Baseline Vocal Quality: Normal Volitional Cough: Strong Volitional  Swallow: Able to elicit    Oral/Motor/Sensory Function Overall Oral Motor/Sensory Function: Within functional limits   Ice Chips Ice chips: Within functional limits   Thin Liquid Thin Liquid: Within functional limits    Nectar Thick Nectar Thick Liquid: Not tested   Honey Thick Honey Thick Liquid: Not tested   Puree Puree: Within functional limits   Solid     Solid: Within functional limits      Juan Quam Laurice 02/07/2022,4:20 PM  Estill Bamberg L. Tivis Ringer, MA CCC/SLP Clinical Specialist - [place] Office number [phone]

## 2022-02-07 NOTE — TOC Progression Note (Signed)
Transition of Care [facility]) - Progression Note    Patient Details  Name: Jason Mcpherson MRN: [id number] Date of Birth: 1940-07-18  [facility] CM/SW Contact  Henrietta Dine, RN Phone Number: 02/07/2022, 4:11 PM  Clinical Narrative: contacted Irving Shows, Central Intake [phone]) regarding pt intake today; she says the facility is not able to receive pt until tomorrow, and his meds cannot be obtained until then; notified Dr Karleen Hampshire; will pass on to oncoming TOC.    Expected Discharge Plan: Home/Self Care Barriers to Discharge: Continued Medical Work up  Expected Discharge Plan and Services Expected Discharge Plan: Home/Self Care   Discharge Planning Services: CM Consult   Living arrangements for the past 2 months: Single Family Home Expected Discharge Date: 02/07/22                                     Social Determinants of Health (SDOH) Interventions    Readmission Risk Interventions    01/31/2022    2:47 PM  Readmission Risk Prevention Plan  Transportation Screening Complete  PCP or Specialist Appt within 5-7 Days Complete  Home Care Screening Complete  Medication Review (RN CM) Complete

## 2022-02-07 NOTE — Plan of Care (Signed)
  Problem: Education: Goal: Knowledge of General Education information will improve Description Including pain rating scale, medication(s)/side effects and non-pharmacologic comfort measures Outcome: Progressing   

## 2022-02-07 NOTE — Discharge Summary (Signed)
Physician Discharge Summary   Patient: Jason Mcpherson MRN: [id number] DOB: Jun 06, 1940  Admit date:     01/30/2022  Discharge date: 01/922  Discharge Physician: Hosie Poisson   PCP: Patrecia Pour, Christean Grief, MD   Recommendations at discharge:  Please follow up with PCP in one week.  Please follow up with orthopedics as recommended.  Please check cbc and bmp in one week.  Please follow up with outpatient palliative care services.  Please follow up with SLP at SNF.  Please follow u pwith an esophagogram as outpatient.   Discharge Diagnoses: Principal Problem:   Closed right hip fracture ([place]) Active Problems:   DM2 (diabetes mellitus, type 2) ([facility])   HTN (hypertension)   Pure hypercholesterolemia   Normocytic anemia   Stage 3a chronic kidney disease (CKD) ([place])   Fall at home, initial encounter   Pressure injury of skin   Malnutrition of moderate degree   Hospital Course:  Jason Mcpherson is a 81 y.o.WM PMHx  HTN, CKD stage 3a, DM type II without complications, HLD.  Anemia, Presenting with right hip pain after fall. History per family at bedside. He was walking through the house 2 days ago when he had an unwitnessed fall. X-ray shows right proximal femur fracture. Orthopedics consulted and patient underwent closed reduction with intramedullary nailing of right proximal femur.  Over the last 48 hours patient's wbc count continues to worsen, unable to find a source of infection.  Venous duplex of the lower extremity ordered, showed bilateral DVT. He was started on eliquis and aspirin discontinued.     Assessment and Plan:   Communicated right intertrochanteric femur fracture S/p closed reduction with intramedullary nailing of right proximal femur on 02/01/2022 Pain control and therapy evaluation. Therapy eval recommended SNF. Currently waiting for SNF placement.    AKI superimposed on stage 3 a CKD Suspect from poor oral intake.  Baseline creatinine appears to be around 2.1.  creatinine peaked to 2.75.  Started on gentle hydration. Creatinine improved to 2.09. to 1.9 with IV fluids.    UA is negative for infection.      Hyperlipidemia Continue with home regimen       Type 2 diabetes mellitus uncontrolled with hyperglycemia, insulin dependent.  Hemoglobin A1c at 6.7 Continue with sliding scale insulin while inpatient. CBG (last 3)  Recent Labs (last 2 labs)        Recent Labs    02/06/22 0335 02/06/22 0731 02/06/22 1158  GLUCAP 129* 179* 168*       Increased the levemir to 16 units daily and novolog SSI. Novolog 5 units TIDAC.  No changes in meds          Hypertension Blood pressure parameters are well controlled.    Leukocytosis:  Unclear etiology.  Suspect reactive.  Gently hydrate, pro calcitonin is 0.18 . UA and CXR is negative.  Venous duplex of the lower extremities ordered , showed bilateral DVT, started him on eliquis.  Discussed the plan with patient's family.        Acute anemia of blood loss superimposed on anemia of chronic disease Patient underwent 2 units of PRBC transfusion .  Hemoglobin around post transfusion is around 9 , dropped to 7.3 today. ? Suspect hemodilution.  Recheck hemoglobin in am.  Transfuse to keep hemoglobin greater than 7.      Hyperkalemia:  Lokelma ordered.  Resolved.    RN reports trouble swallowing for the patient.  SLP evaluation ordered and pending.    Pressure  injury present on admission.  Pressure Injury 01/31/22 Buttocks Right Deep Tissue Pressure Injury - Purple or maroon localized area of discolored intact skin or blood-filled blister due to damage of underlying soft tissue from pressure and/or shear. DTPI evolving to Unstageable (Active)  01/31/22 2030  Location: Buttocks  Location Orientation: Right  Staging: Deep Tissue Pressure Injury - Purple or maroon localized area of discolored intact skin or blood-filled blister due to damage of underlying soft tissue from pressure and/or  shear.  Wound Description (Comments): DTPI evolving to Unstageable  Present on Admission: Yes (unknown)     Pressure Injury 01/31/22 Coccyx Right;Left Deep Tissue Pressure Injury - Purple or maroon localized area of discolored intact skin or blood-filled blister due to damage of underlying soft tissue from pressure and/or shear. DTPI evolving to unstageable (Active)  01/31/22 2030  Location: Coccyx  Location Orientation: Right;Left  Staging: Deep Tissue Pressure Injury - Purple or maroon localized area of discolored intact skin or blood-filled blister due to damage of underlying soft tissue from pressure and/or shear.  Wound Description (Comments): DTPI evolving to unstageable  Present on Admission: Yes (unknown)    Present on admission. Wound care consulted.  Continue with local wound care.    Vomiting yesterday.  Abd x ray shows mod constipation.  2 BM in the last 24 hours.         Consultants: orthopedics.  Procedures performed: surgical repair of the  right femur fracture.  Disposition: Skilled nursing facility Diet recommendation:  Discharge Diet Orders (From admission, onward)     Start     Ordered   02/07/22 0000  Diet - low sodium heart healthy        02/07/22 1159           Regular diet DISCHARGE MEDICATION: Allergies as of 02/07/2022       Reactions   Amlodipine Swelling   ANKLE EDEMA        Medication List     STOP taking these medications    aspirin 81 MG chewable tablet   spironolactone 25 MG tablet Commonly known as: ALDACTONE       TAKE these medications    acetaminophen 500 MG tablet Commonly known as: TYLENOL Take 1,000 mg by mouth every 6 (six) hours as needed for mild pain.   amLODipine 5 MG tablet Commonly known as: NORVASC TAKE 1 TABLET (5 MG TOTAL) BY MOUTH DAILY.   apixaban 5 MG Tabs tablet Commonly known as: ELIQUIS Take 2 tablets (10 mg total) by mouth 2 (two) times daily for 4 days.   apixaban 5 MG Tabs  tablet Commonly known as: ELIQUIS Take 1 tablet (5 mg total) by mouth 2 (two) times daily. Start taking on: February 12, 2022   bisacodyl 10 MG suppository Commonly known as: DULCOLAX Place 1 suppository (10 mg total) rectally daily as needed for moderate constipation.   docusate sodium 100 MG capsule Commonly known as: COLACE Take 1 capsule (100 mg total) by mouth 2 (two) times daily.   Ensure Max Protein Liqd Take 330 mLs (11 oz total) by mouth 2 (two) times daily.   Farxiga 5 MG Tabs tablet Generic drug: dapagliflozin propanediol Take 5 mg by mouth daily.   ferrous sulfate 325 (65 FE) MG tablet Take 325 mg by mouth every morning.   glimepiride 1 MG tablet Commonly known as: AMARYL Take 1.5 mg by mouth every morning.   HYDROcodone-acetaminophen 5-325 MG tablet Commonly known as: NORCO/VICODIN Take 1 tablet by mouth  every 4 (four) hours as needed for moderate pain (pain score 4-6).   multivitamin with minerals Tabs tablet Take 1 tablet by mouth daily. Start taking on: February 09, 1307   OneTouch Delica Lancets Fine Misc USED TO CHECK SUGAR 2X DAILY.   OneTouch Delica Plus [id number] Misc USED TO CHECK SUGAR 2X DAILY.   OneTouch Ultra test strip Generic drug: glucose blood USE AS DIRECTED TO TEST TWICE DAILY   polyethylene glycol 17 g packet Commonly known as: MIRALAX / GLYCOLAX Take 17 g by mouth daily as needed for mild constipation.   simvastatin 20 MG tablet Commonly known as: ZOCOR TAKE 1 TABLET BY MOUTH EVERY DAY IN THE EVENING What changed:  how much to take how to take this   torsemide 10 MG tablet Commonly known as: DEMADEX Take 10 mg by mouth daily as needed for edema.   Vitamin D (Ergocalciferol) 1.25 MG (50000 UNIT) Caps capsule Commonly known as: DRISDOL Take 50,000 Units by mouth once a week. Saturday               Discharge Care Instructions  (From admission, onward)           Start     Ordered   02/07/22 0000  Discharge  wound care:       Comments: 02/05/22 1000    Wound care  Daily at 5am      Comments: Apply Medihoney to left and right buttock wounds Q day, then cover with foam dressing.  (Change foam dressing Q 3 days or PRN soiling.)  02/05/22 0945    02/01/22 1816    Reinforce dressing  Until discontinued       02/01/22 1816   Unscheduled    Reinforce dressing  As needed   02/07/22 1159            Contact information for follow-up providers     Paralee Cancel, MD. Schedule an appointment as soon as possible for a visit in 2 week(s).   Specialty: Orthopedic Surgery Contact information: [address] [place] [address] [phone]              Contact information for after-discharge care     Destination     [facility] .   Service: Skilled Nursing Contact information: [address] [place] [phone]                    Discharge Exam: Filed Weights   02/06/22 0423 02/06/22 0442 02/07/22 0400  Weight: 69.8 kg 67.6 kg 70.3 kg   General exam: Appears calm and comfortable  Respiratory system: Clear to auscultation. Respiratory effort normal. Cardiovascular system: S1 & S2 heard, RRR. No JVD, murmurs, rubs, gallops or clicks. No pedal edema. Gastrointestinal system: Abdomen is nondistended, soft and nontender. No organomegaly or masses felt. Normal bowel sounds heard. Central nervous system: Alert and oriented. No focal neurological deficits. Extremities: Symmetric 5 x 5 power. Skin: No rashes, lesions or ulcers Psychiatry: Judgement and insight appear normal. Mood & affect appropriate.    Condition at discharge: fair  The results of significant diagnostics from this hospitalization (including imaging, microbiology, ancillary and laboratory) are listed below for reference.   Imaging Studies: VAS [place] LOWER EXTREMITY VENOUS (DVT)  Result Date: 02/06/2022  Lower Venous DVT Study Patient  Name:  DAELEN BELVEDERE  Date of Exam:   02/05/2022 Medical Rec #: [id number]  Accession #:    [id number] Date of Birth: 25-Feb-1941         Patient Gender: M Patient Age:   42 years Exam Location:  [facility] Procedure:      VAS [place] LOWER EXTREMITY VENOUS (DVT) Referring Phys: Hosie Poisson --------------------------------------------------------------------------------  Indications: Swelling.  Risk Factors: Trauma. Limitations: Poor ultrasound/tissue interface and patient immobility, patient positioning. Comparison Study: No prior studies. Performing Technologist: Oliver Hum RVT  Examination Guidelines: A complete evaluation includes B-mode imaging, spectral Doppler, color Doppler, and power Doppler as needed of all accessible portions of each vessel. Bilateral testing is considered an integral part of a complete examination. Limited examinations for reoccurring indications may be performed as noted. The reflux portion of the exam is performed with the patient in reverse Trendelenburg.  +---------+---------------+---------+-----------+----------+-------------------+ RIGHT    CompressibilityPhasicitySpontaneityPropertiesThrombus Aging      +---------+---------------+---------+-----------+----------+-------------------+ CFV      Full           Yes      Yes                                      +---------+---------------+---------+-----------+----------+-------------------+ SFJ      Full                                                             +---------+---------------+---------+-----------+----------+-------------------+ FV Prox  Full                                                             +---------+---------------+---------+-----------+----------+-------------------+ FV Mid   Full                                                             +---------+---------------+---------+-----------+----------+-------------------+ FV DistalFull                                                              +---------+---------------+---------+-----------+----------+-------------------+ PFV      Full                                                             +---------+---------------+---------+-----------+----------+-------------------+ POP      Full           Yes      Yes                                      +---------+---------------+---------+-----------+----------+-------------------+  PTV      Full                                                             +---------+---------------+---------+-----------+----------+-------------------+ PERO                                                  Not well visualized +---------+---------------+---------+-----------+----------+-------------------+ Gastroc  Partial                                      Acute               +---------+---------------+---------+-----------+----------+-------------------+   +---------+---------------+---------+-----------+----------+--------------+ LEFT     CompressibilityPhasicitySpontaneityPropertiesThrombus Aging +---------+---------------+---------+-----------+----------+--------------+ CFV      None           No       No                   Acute          +---------+---------------+---------+-----------+----------+--------------+ SFJ      None                                         Acute          +---------+---------------+---------+-----------+----------+--------------+ FV Prox  Full                                                        +---------+---------------+---------+-----------+----------+--------------+ FV Mid   Full                                                        +---------+---------------+---------+-----------+----------+--------------+ FV DistalFull                                                        +---------+---------------+---------+-----------+----------+--------------+ PFV      None            No       No                   Acute          +---------+---------------+---------+-----------+----------+--------------+ POP      Full           Yes      Yes                                 +---------+---------------+---------+-----------+----------+--------------+ PTV      Full                                                        +---------+---------------+---------+-----------+----------+--------------+  PERO     Full                                                        +---------+---------------+---------+-----------+----------+--------------+ EIV                     Yes      Yes                                 +---------+---------------+---------+-----------+----------+--------------+     Summary: RIGHT: - Findings consistent with acute deep vein thrombosis involving the right gastrocnemius veins. - No cystic structure found in the popliteal fossa.  LEFT: - Findings consistent with acute deep vein thrombosis involving the left common femoral vein, and left proximal profunda vein. - No cystic structure found in the popliteal fossa.  *See table(s) above for measurements and observations. Electronically signed by Orlie Pollen on 02/06/2022 at 11:11:54 AM.    Final    DG Abd Portable 1V  Result Date: 02/05/2022 CLINICAL DATA:  Vomiting.  Recent right femoral ORIF. EXAM: PORTABLE ABDOMEN - 1 VIEW COMPARISON:  None Available. FINDINGS: There is moderate gaseous distension of the stomach. A moderate amount of stool is present in the colon. No dilated loops of bowel are seen to suggest obstruction. Lumbar spine fusion and right femoral ORIF are noted. IMPRESSION: Moderate gaseous distension of the stomach and moderate colonic stool burden. No evidence of bowel obstruction. Electronically Signed   By: Logan Bores M.D.   On: 02/05/2022 13:52   DG CHEST PORT 1 VIEW  Result Date: 02/04/2022 CLINICAL DATA:  Cough EXAM: PORTABLE CHEST 1 VIEW COMPARISON:  01/30/2022 FINDINGS:  Cardiac shadow is within normal limits. Aortic calcifications are seen. The lungs are well aerated bilaterally. Minimal right basilar atelectasis is seen. No sizable effusion is noted. No acute bony abnormality is seen. IMPRESSION: Mild right basilar atelectasis. Electronically Signed   By: Inez Catalina M.D.   On: 02/04/2022 20:13   DG HIP UNILAT WITH PELVIS 2-3 VIEWS RIGHT  Result Date: 02/01/2022 CLINICAL DATA:  Intramedullary nail fixation right femur. Intraoperative fluoroscopy. EXAM: DG HIP (WITH OR WITHOUT PELVIS) 2-3V RIGHT COMPARISON:  Right femur radiographs 01/30/2022 FINDINGS: Images were performed intraoperatively without the presence of a radiologist. The patient is undergoing cephalomedullary nail fixation of the previously seen proximal right femoral intertrochanteric fracture. There is improved, near anatomic alignment. Mild displacement of the lesser trochanter. No evidence of hardware complication. Total fluoroscopy images: 7 Total fluoroscopy time: 42 seconds Total dose: Radiation Exposure Index (as provided by the fluoroscopic device): 4.3 mGy air Kerma Please see intraoperative findings for further detail. IMPRESSION: Intraoperative fluoroscopy during cephalomedullary nail fixation of the proximal right femoral intertrochanteric fracture. Electronically Signed   By: Yvonne Kendall M.D.   On: 02/01/2022 17:38   DG C-Arm 1-60 Min-No Report  Result Date: 02/01/2022 Fluoroscopy was utilized by the requesting physician.  No radiographic interpretation.   DG Hip Unilat W or Wo Pelvis 2-3 Views Right  Result Date: 01/30/2022 CLINICAL DATA:  Fall 2 days ago. EXAM: DG HIP (WITH OR WITHOUT PELVIS) 2-3V RIGHT; RIGHT FEMUR 2 VIEWS; DG HIP (WITH OR WITHOUT PELVIS) 2-3V LEFT COMPARISON:  None Available. FINDINGS: Right hip: There is diffuse decreased bone mineralization.  There is a comminuted fracture involving the superior through inferior aspect of the right proximal femoral intertrochanteric  region and likely the adjacent distal right femoral neck. There is up to approximately 2 cm medial displacement of the right lesser trochanter. The right femoroacetabular joint remains normally located. Moderate left and mild right femoroacetabular joint space narrowing. -- Left hip: There is moderate chronic appearing heterotopic bone formation superior to the left greater trochanter and inferior to the right femoral neck. No definite left hip fracture is visualized. Lower lumbar spine bilateral transpedicular rod and screw fusion hardware. -- Right femur: There is diffuse decreased bone mineralization. Severe patellofemoral joint space narrowing and peripheral osteophytosis. Severe lateral greater than medial compartment of the knee joint space narrowing and peripheral osteophytosis. No acute fracture is seen within the distal right femur. Small knee joint effusion. Moderate right thigh vascular calcifications. IMPRESSION: 1. Acute, comminuted fracture of the right proximal femoral intertrochanteric region and likely the adjacent distal right femoral neck. 2. Moderate left and mild right femoroacetabular osteoarthritis. 3. Severe tricompartmental osteoarthritis of the right knee. Electronically Signed   By: Yvonne Kendall M.D.   On: 01/30/2022 10:57   DG Femur Min 2 Views Right  Result Date: 01/30/2022 CLINICAL DATA:  Fall 2 days ago. EXAM: DG HIP (WITH OR WITHOUT PELVIS) 2-3V RIGHT; RIGHT FEMUR 2 VIEWS; DG HIP (WITH OR WITHOUT PELVIS) 2-3V LEFT COMPARISON:  None Available. FINDINGS: Right hip: There is diffuse decreased bone mineralization. There is a comminuted fracture involving the superior through inferior aspect of the right proximal femoral intertrochanteric region and likely the adjacent distal right femoral neck. There is up to approximately 2 cm medial displacement of the right lesser trochanter. The right femoroacetabular joint remains normally located. Moderate left and mild right femoroacetabular  joint space narrowing. -- Left hip: There is moderate chronic appearing heterotopic bone formation superior to the left greater trochanter and inferior to the right femoral neck. No definite left hip fracture is visualized. Lower lumbar spine bilateral transpedicular rod and screw fusion hardware. -- Right femur: There is diffuse decreased bone mineralization. Severe patellofemoral joint space narrowing and peripheral osteophytosis. Severe lateral greater than medial compartment of the knee joint space narrowing and peripheral osteophytosis. No acute fracture is seen within the distal right femur. Small knee joint effusion. Moderate right thigh vascular calcifications. IMPRESSION: 1. Acute, comminuted fracture of the right proximal femoral intertrochanteric region and likely the adjacent distal right femoral neck. 2. Moderate left and mild right femoroacetabular osteoarthritis. 3. Severe tricompartmental osteoarthritis of the right knee. Electronically Signed   By: Yvonne Kendall M.D.   On: 01/30/2022 10:57   DG Hip Unilat W or Wo Pelvis 2-3 Views Left  Result Date: 01/30/2022 CLINICAL DATA:  Fall 2 days ago. EXAM: DG HIP (WITH OR WITHOUT PELVIS) 2-3V RIGHT; RIGHT FEMUR 2 VIEWS; DG HIP (WITH OR WITHOUT PELVIS) 2-3V LEFT COMPARISON:  None Available. FINDINGS: Right hip: There is diffuse decreased bone mineralization. There is a comminuted fracture involving the superior through inferior aspect of the right proximal femoral intertrochanteric region and likely the adjacent distal right femoral neck. There is up to approximately 2 cm medial displacement of the right lesser trochanter. The right femoroacetabular joint remains normally located. Moderate left and mild right femoroacetabular joint space narrowing. -- Left hip: There is moderate chronic appearing heterotopic bone formation superior to the left greater trochanter and inferior to the right femoral neck. No definite left hip fracture is visualized. Lower  lumbar spine bilateral  transpedicular rod and screw fusion hardware. -- Right femur: There is diffuse decreased bone mineralization. Severe patellofemoral joint space narrowing and peripheral osteophytosis. Severe lateral greater than medial compartment of the knee joint space narrowing and peripheral osteophytosis. No acute fracture is seen within the distal right femur. Small knee joint effusion. Moderate right thigh vascular calcifications. IMPRESSION: 1. Acute, comminuted fracture of the right proximal femoral intertrochanteric region and likely the adjacent distal right femoral neck. 2. Moderate left and mild right femoroacetabular osteoarthritis. 3. Severe tricompartmental osteoarthritis of the right knee. Electronically Signed   By: Yvonne Kendall M.D.   On: 01/30/2022 10:57   DG Chest 1 View  Result Date: 01/30/2022 CLINICAL DATA:  Trauma, fall EXAM: CHEST  1 VIEW COMPARISON:  08/10/2021 FINDINGS: Cardiac size is within normal limits. There is poor inspiration. Increased interstitial markings are seen in parahilar regions and lower lung fields. There is no focal consolidation. There is no pleural effusion or pneumothorax. Degenerative changes with possible chronic tear of rotator cuff is seen in both shoulders. IMPRESSION: Increased markings in parahilar regions and lower lung fields may suggest crowding of bronchovascular structures due to poor inspiration or interstitial pneumonia. Electronically Signed   By: Elmer Picker M.D.   On: 01/30/2022 10:55    Microbiology: Results for orders placed or performed during the hospital encounter of 01/30/22  Surgical PCR screen     Status: None   Collection Time: 01/30/22  6:02 PM   Specimen: Nasal Mucosa; Nasal Swab  Result Value Ref Range Status   MRSA, PCR NEGATIVE NEGATIVE Final   Staphylococcus aureus NEGATIVE NEGATIVE Final    Comment: (NOTE) The Xpert SA Assay (FDA approved for NASAL specimens in patients 60 years of age and older), is one  component of a comprehensive surveillance program. It is not intended to diagnose infection nor to guide or monitor treatment. Performed at [facility], [place] [address] [place], [place] [address]   Culture, blood (Routine X 2) w Reflex to ID Panel     Status: None (Preliminary result)   Collection Time: 02/05/22 11:43 AM   Specimen: BLOOD  Result Value Ref Range Status   Specimen Description   Final    BLOOD BLOOD LEFT ARM Performed at [facility] [address], [place], [place] [address]    Special Requests   Final    BOTTLES DRAWN AEROBIC ONLY Blood Culture adequate volume Performed at [facility] [address] [place], [place] [address]    Culture   Final    NO GROWTH 2 DAYS Performed at [facility] [address], [place], [place] [address]    Report Status PENDING  Incomplete  Culture, blood (Routine X 2) w Reflex to ID Panel     Status: None (Preliminary result)   Collection Time: 02/05/22 11:43 AM   Specimen: BLOOD  Result Value Ref Range Status   Specimen Description   Final    BLOOD BLOOD RIGHT ARM Performed at [facility] [address], [place], [place] [address]    Special Requests   Final    BOTTLES DRAWN AEROBIC ONLY Blood Culture adequate volume Performed at [facility] [address], [place], [place] [address]    Culture   Final    NO GROWTH 2 DAYS Performed at [facility] [address], [place], [place] [address]    Report Status PENDING  Incomplete    Labs: CBC:  Recent Labs  Lab 02/03/22 0345 02/04/22 0336 02/05/22 0331 02/06/22 0322 02/07/22 0347  WBC 14.3* 18.0* 21.2* 14.8* 14.8*  NEUTROABS 12.2* 15.6* 18.8* 12.4* 11.9*  HGB 8.8* 9.5* 9.3* 7.3* 7.8*  HCT 28.0* 29.8* 29.2* 24.4* 25.8*  MCV 93.0 91.4 92.4 96.8 95.6  PLT 294 333 325 318 275   Basic Metabolic Panel: Recent Labs  Lab 02/02/22 0338 02/03/22 0345  02/03/22 1845 02/04/22 0336 02/05/22 0331 02/06/22 0322  NA 139 136 134* 139 134* 136  K 4.9 5.4* 5.2* 5.0 5.1 4.7  CL 116* 109 107 108 104 108  CO2 16* 18* 19* 22 23 22   GLUCOSE 249* 258* 328* 120* 145* 137*  BUN 93* 48* 99* 92* 85* 81*  CREATININE 2.44* 2.75* 2.42* 2.11* 2.09* 1.91*  CALCIUM 8.6* 9.0 8.8* 9.1 8.4* 8.0*  MG 2.1 2.3  --  2.0 2.0 2.1  PHOS 3.0 2.5  --  2.1* 2.9 4.1   Liver Function Tests: Recent Labs  Lab 02/01/22 0343 02/02/22 0338 02/03/22 0345 02/04/22 0336 02/05/22 0331  AST 12* 13* 17 26 19   ALT 13 11 6 6 6   ALKPHOS 48 39 52 54 52  BILITOT 0.7 0.6 0.5 0.7 0.6  PROT 6.3* 5.7* 6.1* 6.2* 5.9*  ALBUMIN 2.6* 2.3* 2.5* 2.3* 2.3*   CBG: Recent Labs  Lab 02/06/22 1638 02/06/22 2028 02/07/22 0028 02/07/22 0424 02/07/22 0745  GLUCAP 106* 88 77 120* 160*    Discharge time spent: 42 minutes.   Signed: Hosie Poisson, MD [facility] 02/07/2022

## 2022-02-08 DIAGNOSIS — M6281 Muscle weakness (generalized): Secondary | ICD-10-CM | POA: Diagnosis not present

## 2022-02-08 DIAGNOSIS — S72001D Fracture of unspecified part of neck of right femur, subsequent encounter for closed fracture with routine healing: Secondary | ICD-10-CM | POA: Diagnosis not present

## 2022-02-08 DIAGNOSIS — Z4789 Encounter for other orthopedic aftercare: Secondary | ICD-10-CM | POA: Diagnosis not present

## 2022-02-08 DIAGNOSIS — E1165 Type 2 diabetes mellitus with hyperglycemia: Secondary | ICD-10-CM | POA: Diagnosis not present

## 2022-02-08 DIAGNOSIS — Z7984 Long term (current) use of oral hypoglycemic drugs: Secondary | ICD-10-CM | POA: Diagnosis not present

## 2022-02-08 DIAGNOSIS — S72001A Fracture of unspecified part of neck of right femur, initial encounter for closed fracture: Secondary | ICD-10-CM | POA: Diagnosis not present

## 2022-02-08 DIAGNOSIS — E875 Hyperkalemia: Secondary | ICD-10-CM | POA: Diagnosis not present

## 2022-02-08 DIAGNOSIS — I129 Hypertensive chronic kidney disease with stage 1 through stage 4 chronic kidney disease, or unspecified chronic kidney disease: Secondary | ICD-10-CM | POA: Diagnosis not present

## 2022-02-08 DIAGNOSIS — Z79899 Other long term (current) drug therapy: Secondary | ICD-10-CM | POA: Diagnosis not present

## 2022-02-08 DIAGNOSIS — R739 Hyperglycemia, unspecified: Secondary | ICD-10-CM | POA: Diagnosis not present

## 2022-02-08 DIAGNOSIS — L89323 Pressure ulcer of left buttock, stage 3: Secondary | ICD-10-CM | POA: Diagnosis not present

## 2022-02-08 DIAGNOSIS — L0390 Cellulitis, unspecified: Secondary | ICD-10-CM | POA: Diagnosis not present

## 2022-02-08 DIAGNOSIS — E11622 Type 2 diabetes mellitus with other skin ulcer: Secondary | ICD-10-CM | POA: Diagnosis not present

## 2022-02-08 DIAGNOSIS — W19XXXA Unspecified fall, initial encounter: Secondary | ICD-10-CM | POA: Diagnosis not present

## 2022-02-08 DIAGNOSIS — R112 Nausea with vomiting, unspecified: Secondary | ICD-10-CM | POA: Diagnosis not present

## 2022-02-08 DIAGNOSIS — N189 Chronic kidney disease, unspecified: Secondary | ICD-10-CM | POA: Diagnosis not present

## 2022-02-08 DIAGNOSIS — E559 Vitamin D deficiency, unspecified: Secondary | ICD-10-CM | POA: Diagnosis not present

## 2022-02-08 DIAGNOSIS — N179 Acute kidney failure, unspecified: Secondary | ICD-10-CM | POA: Diagnosis not present

## 2022-02-08 DIAGNOSIS — M4802 Spinal stenosis, cervical region: Secondary | ICD-10-CM | POA: Diagnosis not present

## 2022-02-08 DIAGNOSIS — N1832 Chronic kidney disease, stage 3b: Secondary | ICD-10-CM | POA: Diagnosis not present

## 2022-02-08 DIAGNOSIS — R102 Pelvic and perineal pain: Secondary | ICD-10-CM | POA: Diagnosis not present

## 2022-02-08 DIAGNOSIS — M1990 Unspecified osteoarthritis, unspecified site: Secondary | ICD-10-CM | POA: Diagnosis not present

## 2022-02-08 DIAGNOSIS — R7889 Finding of other specified substances, not normally found in blood: Secondary | ICD-10-CM | POA: Diagnosis not present

## 2022-02-08 DIAGNOSIS — R7881 Bacteremia: Secondary | ICD-10-CM | POA: Diagnosis not present

## 2022-02-08 DIAGNOSIS — R2689 Other abnormalities of gait and mobility: Secondary | ICD-10-CM | POA: Diagnosis not present

## 2022-02-08 DIAGNOSIS — L89313 Pressure ulcer of right buttock, stage 3: Secondary | ICD-10-CM | POA: Diagnosis not present

## 2022-02-08 DIAGNOSIS — Z7901 Long term (current) use of anticoagulants: Secondary | ICD-10-CM | POA: Diagnosis not present

## 2022-02-08 DIAGNOSIS — S7291XA Unspecified fracture of right femur, initial encounter for closed fracture: Secondary | ICD-10-CM | POA: Diagnosis not present

## 2022-02-08 DIAGNOSIS — D62 Acute posthemorrhagic anemia: Secondary | ICD-10-CM | POA: Diagnosis not present

## 2022-02-08 DIAGNOSIS — E1122 Type 2 diabetes mellitus with diabetic chronic kidney disease: Secondary | ICD-10-CM | POA: Diagnosis not present

## 2022-02-08 DIAGNOSIS — N1831 Chronic kidney disease, stage 3a: Secondary | ICD-10-CM | POA: Diagnosis not present

## 2022-02-08 DIAGNOSIS — M25551 Pain in right hip: Secondary | ICD-10-CM | POA: Diagnosis not present

## 2022-02-08 DIAGNOSIS — E785 Hyperlipidemia, unspecified: Secondary | ICD-10-CM | POA: Diagnosis not present

## 2022-02-08 DIAGNOSIS — E860 Dehydration: Secondary | ICD-10-CM | POA: Diagnosis not present

## 2022-02-08 DIAGNOSIS — I82403 Acute embolism and thrombosis of unspecified deep veins of lower extremity, bilateral: Secondary | ICD-10-CM | POA: Diagnosis not present

## 2022-02-08 DIAGNOSIS — Z7401 Bed confinement status: Secondary | ICD-10-CM | POA: Diagnosis not present

## 2022-02-08 DIAGNOSIS — E46 Unspecified protein-calorie malnutrition: Secondary | ICD-10-CM | POA: Diagnosis not present

## 2022-02-08 DIAGNOSIS — D649 Anemia, unspecified: Secondary | ICD-10-CM | POA: Diagnosis not present

## 2022-02-08 DIAGNOSIS — E7800 Pure hypercholesterolemia, unspecified: Secondary | ICD-10-CM | POA: Diagnosis not present

## 2022-02-08 DIAGNOSIS — Z743 Need for continuous supervision: Secondary | ICD-10-CM | POA: Diagnosis not present

## 2022-02-08 DIAGNOSIS — I10 Essential (primary) hypertension: Secondary | ICD-10-CM | POA: Diagnosis not present

## 2022-02-08 DIAGNOSIS — M1711 Unilateral primary osteoarthritis, right knee: Secondary | ICD-10-CM | POA: Diagnosis not present

## 2022-02-08 DIAGNOSIS — E119 Type 2 diabetes mellitus without complications: Secondary | ICD-10-CM | POA: Diagnosis not present

## 2022-02-08 DIAGNOSIS — S72144D Nondisplaced intertrochanteric fracture of right femur, subsequent encounter for closed fracture with routine healing: Secondary | ICD-10-CM | POA: Diagnosis not present

## 2022-02-08 DIAGNOSIS — L89152 Pressure ulcer of sacral region, stage 2: Secondary | ICD-10-CM | POA: Diagnosis not present

## 2022-02-08 LAB — GLUCOSE, CAPILLARY
Glucose-Capillary: 134 mg/dL — ABNORMAL HIGH (ref 70–99)
Glucose-Capillary: 117 mg/dL — ABNORMAL HIGH (ref 70–99)

## 2022-02-08 NOTE — Progress Notes (Signed)
Called facility and gave report to [facility], all questions and concerns addressed. Pt not in acute distress, Jason Mcpherson (son) called and updated. Pt discharged to facility with belongings via [facility].

## 2022-02-08 NOTE — Plan of Care (Signed)
  Problem: Education: Goal: Knowledge of General Education information will improve Description Including pain rating scale, medication(s)/side effects and non-pharmacologic comfort measures Outcome: Progressing   

## 2022-02-08 NOTE — TOC Transition Note (Addendum)
Transition of Care [facility]) - CM/SW Discharge Note   Patient Details  Name: Jason Mcpherson MRN: [id number] Date of Birth: Dec 04, 1940  Transition of Care [facility]) CM/SW Contact:  Ross Ludwig, LCSW Phone Number: 02/08/2022, 10:49 AM   Clinical Narrative:     CSW spoke to Jason Mcpherson at [facility], patient can admit today.  Insurance authorization was approved 10/5 - 10/12, next review 74/16, certification#  [id number]  Patient to be d/c'ed today to [facility] room 114B.  Patient and family agreeable to plans will transport via ems RN to call report [phone].  Patient's wife Jason Mcpherson [phone] or son Jason Mcpherson [phone] would like to be called once EMS arrives.  CSW signing off please reconsult if other social work needs arise.  12:04pm  CSW updated patient's son Jason Mcpherson who is aware that patient is discharging today.    Final next level of care: Skilled Nursing Facility Barriers to Discharge: Barriers Resolved   Patient Goals and CMS Choice Patient states their goals for this hospitalization and ongoing recovery are:: To go to SNF for short term rehab, then return back home with wife. CMS Medicare.gov Compare Post Acute Care list provided to:: Patient Represenative (must comment) Choice offered to / list presented to : Adult Children, Spouse  Discharge Placement   Existing PASRR number confirmed : 02/02/22          Patient chooses bed at: Other - please specify in the comment section below: [facility]) Patient to be transferred to facility by: [facility] Name of family member notified: Wife Jason Mcpherson and son Jason Mcpherson are aware that he is discharging today. Patient and family notified of of transfer: 02/08/22  Discharge Plan and Services   Discharge Planning Services: CM Consult                                 Social Determinants of Health (SDOH) Interventions     Readmission Risk Interventions    01/31/2022    2:47 PM  Readmission Risk Prevention Plan   Transportation Screening Complete  PCP or Specialist Appt within 5-7 Days Complete  Home Care Screening Complete  Medication Review (RN CM) Complete

## 2022-02-08 NOTE — Plan of Care (Signed)
  Problem: Education: Goal: Knowledge of General Education information will improve Description: Including pain rating scale, medication(s)/side effects and non-pharmacologic comfort measures Outcome: Progressing   Problem: Health Behavior/Discharge Planning: Goal: Ability to manage health-related needs will improve Outcome: Progressing   Problem: Nutrition: Goal: Adequate nutrition will be maintained Outcome: Progressing   Problem: Elimination: Goal: Will not experience complications related to bowel motility Outcome: Progressing Goal: Will not experience complications related to urinary retention Outcome: Progressing   Problem: Pain Managment: Goal: General experience of comfort will improve Outcome: Progressing   

## 2022-02-08 NOTE — Discharge Summary (Signed)
Physician Discharge Summary   Patient: Jason Mcpherson MRN: [id number] DOB: 22-Sep-1940  Admit date:     01/30/2022  Discharge date: 01/922  Discharge Physician: Hosie Poisson   PCP: Patrecia Pour, Christean Grief, MD   Recommendations at discharge:  Please follow up with PCP in one week.  Please follow up with orthopedics as recommended.  Please check cbc and bmp in one week.  Please follow up with outpatient palliative care services.  Please follow up with SLP at SNF.  Please follow up with an esophagogram as outpatient.   Discharge Diagnoses: Principal Problem:   Closed right hip fracture ([place]) Active Problems:   DM2 (diabetes mellitus, type 2) ([facility])   HTN (hypertension)   Pure hypercholesterolemia   Normocytic anemia   Stage 3a chronic kidney disease (CKD) ([place])   Fall at home, initial encounter   Pressure injury of skin   Malnutrition of moderate degree   Hospital Course:  Jason Mcpherson is a 81 y.o.WM PMHx  HTN, CKD stage 3a, DM type II without complications, HLD.  Anemia, Presenting with right hip pain after fall. History per family at bedside. He was walking through the house 2 days ago when he had an unwitnessed fall. X-ray shows right proximal femur fracture. Orthopedics consulted and patient underwent closed reduction with intramedullary nailing of right proximal femur.  Over the last 48 hours patient's wbc count continues to worsen, unable to find a source of infection.  Venous duplex of the lower extremity ordered, showed bilateral DVT. He was started on eliquis and aspirin discontinued.     Assessment and Plan:   Communicated right intertrochanteric femur fracture S/p closed reduction with intramedullary nailing of right proximal femur on 02/01/2022 Pain control and therapy evaluation. Therapy eval recommended SNF. Currently waiting for SNF placement.    AKI superimposed on stage 3 a CKD Suspect from poor oral intake.  Baseline creatinine appears to be around 2.1.  creatinine peaked to 2.75.  Started on gentle hydration. Creatinine improved to 2.09. to 1.9 with IV fluids.    UA is negative for infection.      Hyperlipidemia Continue with home regimen       Type 2 diabetes mellitus uncontrolled with hyperglycemia, Hemoglobin A1c at 6.7 Resume home meds on discharge.       Hypertension Blood pressure parameters are well controlled.    Leukocytosis:  Unclear etiology.  Suspect reactive.  Gently hydrate, pro calcitonin is 0.18 . UA and CXR is negative.  Venous duplex of the lower extremities ordered , showed bilateral DVT, started him on eliquis.  Discussed the plan with patient's family.        Acute anemia of blood loss superimposed on anemia of chronic disease Patient underwent 2 units of PRBC transfusion .  Hemoglobin around post transfusion is around between 8 to 9.       Hyperkalemia:  Lokelma ordered.  Resolved.    RN reports trouble swallowing for the patient.  SLP evaluation ordered and recommended outpatient esophagogram.  Continue with current diet.    Pressure injury present on admission.  Pressure Injury 01/31/22 Buttocks Right Deep Tissue Pressure Injury - Purple or maroon localized area of discolored intact skin or blood-filled blister due to damage of underlying soft tissue from pressure and/or shear. DTPI evolving to Unstageable (Active)  01/31/22 2030  Location: Buttocks  Location Orientation: Right  Staging: Deep Tissue Pressure Injury - Purple or maroon localized area of discolored intact skin or blood-filled blister due to damage  of underlying soft tissue from pressure and/or shear.  Wound Description (Comments): DTPI evolving to Unstageable  Present on Admission: Yes (unknown)     Pressure Injury 01/31/22 Coccyx Right;Left Deep Tissue Pressure Injury - Purple or maroon localized area of discolored intact skin or blood-filled blister due to damage of underlying soft tissue from pressure and/or shear. DTPI  evolving to unstageable (Active)  01/31/22 2030  Location: Coccyx  Location Orientation: Right;Left  Staging: Deep Tissue Pressure Injury - Purple or maroon localized area of discolored intact skin or blood-filled blister due to damage of underlying soft tissue from pressure and/or shear.  Wound Description (Comments): DTPI evolving to unstageable  Present on Admission: Yes (unknown)    Present on admission. Wound care consulted.  Continue with local wound care.            Consultants: orthopedics.  Procedures performed: surgical repair of the  right femur fracture.  Disposition: Skilled nursing facility Diet recommendation:  Discharge Diet Orders (From admission, onward)     Start     Ordered   02/07/22 0000  Diet - low sodium heart healthy        02/07/22 1159           Regular diet DISCHARGE MEDICATION: Allergies as of 02/08/2022       Reactions   Amlodipine Swelling   ANKLE EDEMA        Medication List     STOP taking these medications    aspirin 81 MG chewable tablet   spironolactone 25 MG tablet Commonly known as: ALDACTONE       TAKE these medications    acetaminophen 500 MG tablet Commonly known as: TYLENOL Take 1,000 mg by mouth every 6 (six) hours as needed for mild pain.   amLODipine 5 MG tablet Commonly known as: NORVASC TAKE 1 TABLET (5 MG TOTAL) BY MOUTH DAILY.   apixaban 5 MG Tabs tablet Commonly known as: ELIQUIS Take 2 tablets (10 mg total) by mouth 2 (two) times daily for 4 days.   apixaban 5 MG Tabs tablet Commonly known as: ELIQUIS Take 1 tablet (5 mg total) by mouth 2 (two) times daily. Start taking on: February 12, 2022   bisacodyl 10 MG suppository Commonly known as: DULCOLAX Place 1 suppository (10 mg total) rectally daily as needed for moderate constipation.   docusate sodium 100 MG capsule Commonly known as: COLACE Take 1 capsule (100 mg total) by mouth 2 (two) times daily.   Ensure Max Protein Liqd Take 330  mLs (11 oz total) by mouth 2 (two) times daily.   Farxiga 5 MG Tabs tablet Generic drug: dapagliflozin propanediol Take 5 mg by mouth daily.   ferrous sulfate 325 (65 FE) MG tablet Take 325 mg by mouth every morning.   glimepiride 1 MG tablet Commonly known as: AMARYL Take 1.5 mg by mouth every morning.   HYDROcodone-acetaminophen 5-325 MG tablet Commonly known as: NORCO/VICODIN Take 1 tablet by mouth every 4 (four) hours as needed for moderate pain (pain score 4-6).   multivitamin with minerals Tabs tablet Take 1 tablet by mouth daily.   OneTouch Delica Lancets Fine Misc USED TO CHECK SUGAR 2X DAILY.   OneTouch Delica Plus [id number] Misc USED TO CHECK SUGAR 2X DAILY.   OneTouch Ultra test strip Generic drug: glucose blood USE AS DIRECTED TO TEST TWICE DAILY   polyethylene glycol 17 g packet Commonly known as: MIRALAX / GLYCOLAX Take 17 g by mouth daily as needed for mild constipation.  simvastatin 20 MG tablet Commonly known as: ZOCOR TAKE 1 TABLET BY MOUTH EVERY DAY IN THE EVENING What changed:  how much to take how to take this   torsemide 10 MG tablet Commonly known as: DEMADEX Take 10 mg by mouth daily as needed for edema.   Vitamin D (Ergocalciferol) 1.25 MG (50000 UNIT) Caps capsule Commonly known as: DRISDOL Take 50,000 Units by mouth once a week. Saturday               Discharge Care Instructions  (From admission, onward)           Start     Ordered   02/07/22 0000  Discharge wound care:       Comments: 02/05/22 1000    Wound care  Daily at 5am      Comments: Apply Medihoney to left and right buttock wounds Q day, then cover with foam dressing.  (Change foam dressing Q 3 days or PRN soiling.)  02/05/22 0945    02/01/22 1816    Reinforce dressing  Until discontinued       02/01/22 1816   Unscheduled    Reinforce dressing  As needed   02/07/22 1159            Contact information for follow-up providers     Paralee Cancel, MD.  Schedule an appointment as soon as possible for a visit in 2 week(s).   Specialty: Orthopedic Surgery Contact information: [address] [place] [address] [phone]              Contact information for after-discharge care     Destination     [facility] .   Service: Skilled Nursing Contact information: [address] [place] [phone]                    Discharge Exam: Filed Weights   02/06/22 0442 02/07/22 0400 02/08/22 0500  Weight: 67.6 kg 70.3 kg 71.7 kg   General exam: Appears calm and comfortable  Respiratory system: Clear to auscultation. Respiratory effort normal. Cardiovascular system: S1 & S2 heard, RRR. No JVD, . No pedal edema. Gastrointestinal system: Abdomen is nondistended, soft and nontender. Normal bowel sounds heard. Central nervous system: Alert and oriented. No focal neurological deficits. Extremities: Symmetric 5 x 5 power. Skin: No rashes, lesions or ulcers Psychiatry: Mood & affect appropriate.    Condition at discharge: fair  The results of significant diagnostics from this hospitalization (including imaging, microbiology, ancillary and laboratory) are listed below for reference.   Imaging Studies: VAS [place] LOWER EXTREMITY VENOUS (DVT)  Result Date: 02/06/2022  Lower Venous DVT Study Patient Name:  HARRELL NIEHOFF  Date of Exam:   02/05/2022 Medical Rec #: [id number]         Accession #:    [id number] Date of Birth: 11-13-1940         Patient Gender: M Patient Age:   81 years Exam Location:  [facility] Procedure:      VAS [place] LOWER EXTREMITY VENOUS (DVT) Referring Phys: Hosie Poisson --------------------------------------------------------------------------------  Indications: Swelling.  Risk Factors: Trauma. Limitations: Poor ultrasound/tissue interface and patient immobility, patient positioning. Comparison Study: No prior studies. Performing  Technologist: Oliver Hum RVT  Examination Guidelines: A complete evaluation includes B-mode imaging, spectral Doppler, color Doppler, and power Doppler as needed of all accessible portions of each vessel. Bilateral testing is considered an integral part of a complete examination.  Limited examinations for reoccurring indications may be performed as noted. The reflux portion of the exam is performed with the patient in reverse Trendelenburg.  +---------+---------------+---------+-----------+----------+-------------------+ RIGHT    CompressibilityPhasicitySpontaneityPropertiesThrombus Aging      +---------+---------------+---------+-----------+----------+-------------------+ CFV      Full           Yes      Yes                                      +---------+---------------+---------+-----------+----------+-------------------+ SFJ      Full                                                             +---------+---------------+---------+-----------+----------+-------------------+ FV Prox  Full                                                             +---------+---------------+---------+-----------+----------+-------------------+ FV Mid   Full                                                             +---------+---------------+---------+-----------+----------+-------------------+ FV DistalFull                                                             +---------+---------------+---------+-----------+----------+-------------------+ PFV      Full                                                             +---------+---------------+---------+-----------+----------+-------------------+ POP      Full           Yes      Yes                                      +---------+---------------+---------+-----------+----------+-------------------+ PTV      Full                                                              +---------+---------------+---------+-----------+----------+-------------------+ PERO  Not well visualized +---------+---------------+---------+-----------+----------+-------------------+ Gastroc  Partial                                      Acute               +---------+---------------+---------+-----------+----------+-------------------+   +---------+---------------+---------+-----------+----------+--------------+ LEFT     CompressibilityPhasicitySpontaneityPropertiesThrombus Aging +---------+---------------+---------+-----------+----------+--------------+ CFV      None           No       No                   Acute          +---------+---------------+---------+-----------+----------+--------------+ SFJ      None                                         Acute          +---------+---------------+---------+-----------+----------+--------------+ FV Prox  Full                                                        +---------+---------------+---------+-----------+----------+--------------+ FV Mid   Full                                                        +---------+---------------+---------+-----------+----------+--------------+ FV DistalFull                                                        +---------+---------------+---------+-----------+----------+--------------+ PFV      None           No       No                   Acute          +---------+---------------+---------+-----------+----------+--------------+ POP      Full           Yes      Yes                                 +---------+---------------+---------+-----------+----------+--------------+ PTV      Full                                                        +---------+---------------+---------+-----------+----------+--------------+ PERO     Full                                                         +---------+---------------+---------+-----------+----------+--------------+ EIV  Yes      Yes                                 +---------+---------------+---------+-----------+----------+--------------+     Summary: RIGHT: - Findings consistent with acute deep vein thrombosis involving the right gastrocnemius veins. - No cystic structure found in the popliteal fossa.  LEFT: - Findings consistent with acute deep vein thrombosis involving the left common femoral vein, and left proximal profunda vein. - No cystic structure found in the popliteal fossa.  *See table(s) above for measurements and observations. Electronically signed by Orlie Pollen on 02/06/2022 at 11:11:54 AM.    Final    DG Abd Portable 1V  Result Date: 02/05/2022 CLINICAL DATA:  Vomiting.  Recent right femoral ORIF. EXAM: PORTABLE ABDOMEN - 1 VIEW COMPARISON:  None Available. FINDINGS: There is moderate gaseous distension of the stomach. A moderate amount of stool is present in the colon. No dilated loops of bowel are seen to suggest obstruction. Lumbar spine fusion and right femoral ORIF are noted. IMPRESSION: Moderate gaseous distension of the stomach and moderate colonic stool burden. No evidence of bowel obstruction. Electronically Signed   By: Logan Bores M.D.   On: 02/05/2022 13:52   DG CHEST PORT 1 VIEW  Result Date: 02/04/2022 CLINICAL DATA:  Cough EXAM: PORTABLE CHEST 1 VIEW COMPARISON:  01/30/2022 FINDINGS: Cardiac shadow is within normal limits. Aortic calcifications are seen. The lungs are well aerated bilaterally. Minimal right basilar atelectasis is seen. No sizable effusion is noted. No acute bony abnormality is seen. IMPRESSION: Mild right basilar atelectasis. Electronically Signed   By: Inez Catalina M.D.   On: 02/04/2022 20:13   DG HIP UNILAT WITH PELVIS 2-3 VIEWS RIGHT  Result Date: 02/01/2022 CLINICAL DATA:  Intramedullary nail fixation right femur. Intraoperative fluoroscopy. EXAM: DG HIP (WITH  OR WITHOUT PELVIS) 2-3V RIGHT COMPARISON:  Right femur radiographs 01/30/2022 FINDINGS: Images were performed intraoperatively without the presence of a radiologist. The patient is undergoing cephalomedullary nail fixation of the previously seen proximal right femoral intertrochanteric fracture. There is improved, near anatomic alignment. Mild displacement of the lesser trochanter. No evidence of hardware complication. Total fluoroscopy images: 7 Total fluoroscopy time: 42 seconds Total dose: Radiation Exposure Index (as provided by the fluoroscopic device): 4.3 mGy air Kerma Please see intraoperative findings for further detail. IMPRESSION: Intraoperative fluoroscopy during cephalomedullary nail fixation of the proximal right femoral intertrochanteric fracture. Electronically Signed   By: Yvonne Kendall M.D.   On: 02/01/2022 17:38   DG C-Arm 1-60 Min-No Report  Result Date: 02/01/2022 Fluoroscopy was utilized by the requesting physician.  No radiographic interpretation.   DG Hip Unilat W or Wo Pelvis 2-3 Views Right  Result Date: 01/30/2022 CLINICAL DATA:  Fall 2 days ago. EXAM: DG HIP (WITH OR WITHOUT PELVIS) 2-3V RIGHT; RIGHT FEMUR 2 VIEWS; DG HIP (WITH OR WITHOUT PELVIS) 2-3V LEFT COMPARISON:  None Available. FINDINGS: Right hip: There is diffuse decreased bone mineralization. There is a comminuted fracture involving the superior through inferior aspect of the right proximal femoral intertrochanteric region and likely the adjacent distal right femoral neck. There is up to approximately 2 cm medial displacement of the right lesser trochanter. The right femoroacetabular joint remains normally located. Moderate left and mild right femoroacetabular joint space narrowing. -- Left hip: There is moderate chronic appearing heterotopic bone formation superior to the left greater trochanter and inferior to the right femoral neck. No definite left  hip fracture is visualized. Lower lumbar spine bilateral  transpedicular rod and screw fusion hardware. -- Right femur: There is diffuse decreased bone mineralization. Severe patellofemoral joint space narrowing and peripheral osteophytosis. Severe lateral greater than medial compartment of the knee joint space narrowing and peripheral osteophytosis. No acute fracture is seen within the distal right femur. Small knee joint effusion. Moderate right thigh vascular calcifications. IMPRESSION: 1. Acute, comminuted fracture of the right proximal femoral intertrochanteric region and likely the adjacent distal right femoral neck. 2. Moderate left and mild right femoroacetabular osteoarthritis. 3. Severe tricompartmental osteoarthritis of the right knee. Electronically Signed   By: Yvonne Kendall M.D.   On: 01/30/2022 10:57   DG Femur Min 2 Views Right  Result Date: 01/30/2022 CLINICAL DATA:  Fall 2 days ago. EXAM: DG HIP (WITH OR WITHOUT PELVIS) 2-3V RIGHT; RIGHT FEMUR 2 VIEWS; DG HIP (WITH OR WITHOUT PELVIS) 2-3V LEFT COMPARISON:  None Available. FINDINGS: Right hip: There is diffuse decreased bone mineralization. There is a comminuted fracture involving the superior through inferior aspect of the right proximal femoral intertrochanteric region and likely the adjacent distal right femoral neck. There is up to approximately 2 cm medial displacement of the right lesser trochanter. The right femoroacetabular joint remains normally located. Moderate left and mild right femoroacetabular joint space narrowing. -- Left hip: There is moderate chronic appearing heterotopic bone formation superior to the left greater trochanter and inferior to the right femoral neck. No definite left hip fracture is visualized. Lower lumbar spine bilateral transpedicular rod and screw fusion hardware. -- Right femur: There is diffuse decreased bone mineralization. Severe patellofemoral joint space narrowing and peripheral osteophytosis. Severe lateral greater than medial compartment of the knee joint  space narrowing and peripheral osteophytosis. No acute fracture is seen within the distal right femur. Small knee joint effusion. Moderate right thigh vascular calcifications. IMPRESSION: 1. Acute, comminuted fracture of the right proximal femoral intertrochanteric region and likely the adjacent distal right femoral neck. 2. Moderate left and mild right femoroacetabular osteoarthritis. 3. Severe tricompartmental osteoarthritis of the right knee. Electronically Signed   By: Yvonne Kendall M.D.   On: 01/30/2022 10:57   DG Hip Unilat W or Wo Pelvis 2-3 Views Left  Result Date: 01/30/2022 CLINICAL DATA:  Fall 2 days ago. EXAM: DG HIP (WITH OR WITHOUT PELVIS) 2-3V RIGHT; RIGHT FEMUR 2 VIEWS; DG HIP (WITH OR WITHOUT PELVIS) 2-3V LEFT COMPARISON:  None Available. FINDINGS: Right hip: There is diffuse decreased bone mineralization. There is a comminuted fracture involving the superior through inferior aspect of the right proximal femoral intertrochanteric region and likely the adjacent distal right femoral neck. There is up to approximately 2 cm medial displacement of the right lesser trochanter. The right femoroacetabular joint remains normally located. Moderate left and mild right femoroacetabular joint space narrowing. -- Left hip: There is moderate chronic appearing heterotopic bone formation superior to the left greater trochanter and inferior to the right femoral neck. No definite left hip fracture is visualized. Lower lumbar spine bilateral transpedicular rod and screw fusion hardware. -- Right femur: There is diffuse decreased bone mineralization. Severe patellofemoral joint space narrowing and peripheral osteophytosis. Severe lateral greater than medial compartment of the knee joint space narrowing and peripheral osteophytosis. No acute fracture is seen within the distal right femur. Small knee joint effusion. Moderate right thigh vascular calcifications. IMPRESSION: 1. Acute, comminuted fracture of the right  proximal femoral intertrochanteric region and likely the adjacent distal right femoral neck. 2. Moderate left and mild right  femoroacetabular osteoarthritis. 3. Severe tricompartmental osteoarthritis of the right knee. Electronically Signed   By: Yvonne Kendall M.D.   On: 01/30/2022 10:57   DG Chest 1 View  Result Date: 01/30/2022 CLINICAL DATA:  Trauma, fall EXAM: CHEST  1 VIEW COMPARISON:  08/10/2021 FINDINGS: Cardiac size is within normal limits. There is poor inspiration. Increased interstitial markings are seen in parahilar regions and lower lung fields. There is no focal consolidation. There is no pleural effusion or pneumothorax. Degenerative changes with possible chronic tear of rotator cuff is seen in both shoulders. IMPRESSION: Increased markings in parahilar regions and lower lung fields may suggest crowding of bronchovascular structures due to poor inspiration or interstitial pneumonia. Electronically Signed   By: Elmer Picker M.D.   On: 01/30/2022 10:55    Microbiology: Results for orders placed or performed during the hospital encounter of 01/30/22  Surgical PCR screen     Status: None   Collection Time: 01/30/22  6:02 PM   Specimen: Nasal Mucosa; Nasal Swab  Result Value Ref Range Status   MRSA, PCR NEGATIVE NEGATIVE Final   Staphylococcus aureus NEGATIVE NEGATIVE Final    Comment: (NOTE) The Xpert SA Assay (FDA approved for NASAL specimens in patients 33 years of age and older), is one component of a comprehensive surveillance program. It is not intended to diagnose infection nor to guide or monitor treatment. Performed at [facility], [place] [address], [place], [place] [address]   Culture, blood (Routine X 2) w Reflex to ID Panel     Status: None (Preliminary result)   Collection Time: 02/05/22 11:43 AM   Specimen: BLOOD  Result Value Ref Range Status   Specimen Description   Final    BLOOD BLOOD LEFT ARM Performed at [facility] [address], [place], [place] [address]    Special Requests   Final    BOTTLES DRAWN AEROBIC ONLY Blood Culture adequate volume Performed at [facility] [address] [place], [place] [address]    Culture   Final    NO GROWTH 2 DAYS Performed at [facility] [address], [place], [place] [address]    Report Status PENDING  Incomplete  Culture, blood (Routine X 2) w Reflex to ID Panel     Status: None (Preliminary result)   Collection Time: 02/05/22 11:43 AM   Specimen: BLOOD  Result Value Ref Range Status   Specimen Description   Final    BLOOD BLOOD RIGHT ARM Performed at [facility] [address], [place], [place] [address]    Special Requests   Final    BOTTLES DRAWN AEROBIC ONLY Blood Culture adequate volume Performed at [facility] [address], [place], [place] [address]    Culture   Final    NO GROWTH 2 DAYS Performed at [facility] [address] [place], [place] [address]    Report Status PENDING  Incomplete    Labs: CBC: Recent Labs  Lab 02/03/22 0345 02/04/22 0336 02/05/22 0331 02/06/22 0322 02/07/22 0347  WBC 14.3* 18.0* 21.2* 14.8* 14.8*  NEUTROABS 12.2* 15.6* 18.8* 12.4* 11.9*  HGB 8.8* 9.5* 9.3* 7.3* 7.8*  HCT 28.0* 29.8* 29.2* 24.4* 25.8*  MCV 93.0 91.4 92.4 96.8 95.6  PLT 294 333 325 318 762   Basic Metabolic Panel: Recent Labs  Lab 02/02/22 0338 02/03/22 0345 02/03/22 1845 02/04/22 0336 02/05/22 0331 02/06/22 0322  NA 139 136 134* 139  134* 136  K 4.9 5.4* 5.2* 5.0 5.1 4.7  CL 116* 109 107 108 104 108  CO2 16* 18* 19* 22 23 22   GLUCOSE 249* 258* 328* 120* 145* 137*  BUN 93* 48* 99* 92* 85* 81*  CREATININE 2.44* 2.75* 2.42* 2.11* 2.09* 1.91*  CALCIUM 8.6* 9.0 8.8* 9.1 8.4* 8.0*  MG 2.1 2.3  --  2.0 2.0 2.1  PHOS 3.0 2.5  --  2.1* 2.9 4.1   Liver Function Tests: Recent Labs  Lab 02/02/22 0338 02/03/22 0345 02/04/22 0336  02/05/22 0331  AST 13* 17 26 19   ALT 11 6 6 6   ALKPHOS 39 52 54 52  BILITOT 0.6 0.5 0.7 0.6  PROT 5.7* 6.1* 6.2* 5.9*  ALBUMIN 2.3* 2.5* 2.3* 2.3*   CBG: Recent Labs  Lab 02/07/22 1653 02/07/22 1957 02/07/22 2350 02/08/22 0344 02/08/22 0734  GLUCAP 165* 162* 120* 134* 117*    Discharge time spent: 42 minutes.   Signed: Hosie Poisson, MD [facility] 02/08/2022

## 2022-02-10 DIAGNOSIS — N179 Acute kidney failure, unspecified: Secondary | ICD-10-CM | POA: Diagnosis not present

## 2022-02-10 DIAGNOSIS — E46 Unspecified protein-calorie malnutrition: Secondary | ICD-10-CM | POA: Diagnosis not present

## 2022-02-10 DIAGNOSIS — N189 Chronic kidney disease, unspecified: Secondary | ICD-10-CM | POA: Diagnosis not present

## 2022-02-10 DIAGNOSIS — E119 Type 2 diabetes mellitus without complications: Secondary | ICD-10-CM | POA: Diagnosis not present

## 2022-02-10 DIAGNOSIS — D62 Acute posthemorrhagic anemia: Secondary | ICD-10-CM | POA: Diagnosis not present

## 2022-02-10 DIAGNOSIS — E559 Vitamin D deficiency, unspecified: Secondary | ICD-10-CM | POA: Diagnosis not present

## 2022-02-10 DIAGNOSIS — E785 Hyperlipidemia, unspecified: Secondary | ICD-10-CM | POA: Diagnosis not present

## 2022-02-10 LAB — CULTURE, BLOOD (ROUTINE X 2)
Culture: NO GROWTH
Culture: NO GROWTH
Special Requests: ADEQUATE
Special Requests: ADEQUATE

## 2022-02-11 DIAGNOSIS — S7291XA Unspecified fracture of right femur, initial encounter for closed fracture: Secondary | ICD-10-CM | POA: Diagnosis not present

## 2022-02-11 DIAGNOSIS — E46 Unspecified protein-calorie malnutrition: Secondary | ICD-10-CM | POA: Diagnosis not present

## 2022-02-11 DIAGNOSIS — N179 Acute kidney failure, unspecified: Secondary | ICD-10-CM | POA: Diagnosis not present

## 2022-02-11 DIAGNOSIS — M1990 Unspecified osteoarthritis, unspecified site: Secondary | ICD-10-CM | POA: Diagnosis not present

## 2022-02-11 DIAGNOSIS — E119 Type 2 diabetes mellitus without complications: Secondary | ICD-10-CM | POA: Diagnosis not present

## 2022-02-11 DIAGNOSIS — N189 Chronic kidney disease, unspecified: Secondary | ICD-10-CM | POA: Diagnosis not present

## 2022-02-17 DIAGNOSIS — E559 Vitamin D deficiency, unspecified: Secondary | ICD-10-CM | POA: Diagnosis not present

## 2022-02-17 DIAGNOSIS — I129 Hypertensive chronic kidney disease with stage 1 through stage 4 chronic kidney disease, or unspecified chronic kidney disease: Secondary | ICD-10-CM | POA: Diagnosis not present

## 2022-02-17 DIAGNOSIS — E785 Hyperlipidemia, unspecified: Secondary | ICD-10-CM | POA: Diagnosis not present

## 2022-02-17 DIAGNOSIS — M1990 Unspecified osteoarthritis, unspecified site: Secondary | ICD-10-CM | POA: Diagnosis not present

## 2022-02-17 DIAGNOSIS — E119 Type 2 diabetes mellitus without complications: Secondary | ICD-10-CM | POA: Diagnosis not present

## 2022-02-17 DIAGNOSIS — I10 Essential (primary) hypertension: Secondary | ICD-10-CM | POA: Diagnosis not present

## 2022-02-17 DIAGNOSIS — E1122 Type 2 diabetes mellitus with diabetic chronic kidney disease: Secondary | ICD-10-CM | POA: Diagnosis not present

## 2022-02-17 DIAGNOSIS — E46 Unspecified protein-calorie malnutrition: Secondary | ICD-10-CM | POA: Diagnosis not present

## 2022-02-17 DIAGNOSIS — N189 Chronic kidney disease, unspecified: Secondary | ICD-10-CM | POA: Diagnosis not present

## 2022-02-22 DIAGNOSIS — Z4789 Encounter for other orthopedic aftercare: Secondary | ICD-10-CM | POA: Diagnosis not present

## 2022-02-23 DIAGNOSIS — L89323 Pressure ulcer of left buttock, stage 3: Secondary | ICD-10-CM | POA: Diagnosis not present

## 2022-02-23 DIAGNOSIS — L89313 Pressure ulcer of right buttock, stage 3: Secondary | ICD-10-CM | POA: Diagnosis not present

## 2022-02-24 DIAGNOSIS — L89313 Pressure ulcer of right buttock, stage 3: Secondary | ICD-10-CM | POA: Diagnosis not present

## 2022-02-24 DIAGNOSIS — E11622 Type 2 diabetes mellitus with other skin ulcer: Secondary | ICD-10-CM | POA: Diagnosis not present

## 2022-02-24 DIAGNOSIS — M6281 Muscle weakness (generalized): Secondary | ICD-10-CM | POA: Diagnosis not present

## 2022-02-24 DIAGNOSIS — L89323 Pressure ulcer of left buttock, stage 3: Secondary | ICD-10-CM | POA: Diagnosis not present

## 2022-02-26 ENCOUNTER — Emergency Department (HOSPITAL_COMMUNITY)
Admission: EM | Admit: 2022-02-26 | Discharge: 2022-02-27 | Disposition: A | Discharge status: Skilled Nursing Facility | Payer: Medicare HMO | Attending: Emergency Medicine | Admitting: Emergency Medicine

## 2022-02-26 ENCOUNTER — Other Ambulatory Visit: Payer: Self-pay

## 2022-02-26 ENCOUNTER — Emergency Department (HOSPITAL_COMMUNITY): Payer: Medicare HMO

## 2022-02-26 ENCOUNTER — Encounter (HOSPITAL_COMMUNITY): Payer: Self-pay | Admitting: Emergency Medicine

## 2022-02-26 DIAGNOSIS — N1832 Chronic kidney disease, stage 3b: Secondary | ICD-10-CM | POA: Insufficient documentation

## 2022-02-26 DIAGNOSIS — D649 Anemia, unspecified: Secondary | ICD-10-CM | POA: Diagnosis not present

## 2022-02-26 DIAGNOSIS — R7889 Finding of other specified substances, not normally found in blood: Secondary | ICD-10-CM | POA: Diagnosis not present

## 2022-02-26 DIAGNOSIS — E1122 Type 2 diabetes mellitus with diabetic chronic kidney disease: Secondary | ICD-10-CM | POA: Diagnosis not present

## 2022-02-26 DIAGNOSIS — E559 Vitamin D deficiency, unspecified: Secondary | ICD-10-CM | POA: Diagnosis not present

## 2022-02-26 DIAGNOSIS — E1165 Type 2 diabetes mellitus with hyperglycemia: Secondary | ICD-10-CM

## 2022-02-26 DIAGNOSIS — E46 Unspecified protein-calorie malnutrition: Secondary | ICD-10-CM | POA: Diagnosis not present

## 2022-02-26 DIAGNOSIS — Z7984 Long term (current) use of oral hypoglycemic drugs: Secondary | ICD-10-CM | POA: Insufficient documentation

## 2022-02-26 DIAGNOSIS — L89152 Pressure ulcer of sacral region, stage 2: Secondary | ICD-10-CM | POA: Diagnosis not present

## 2022-02-26 DIAGNOSIS — Z743 Need for continuous supervision: Secondary | ICD-10-CM | POA: Diagnosis not present

## 2022-02-26 DIAGNOSIS — N189 Chronic kidney disease, unspecified: Secondary | ICD-10-CM | POA: Diagnosis not present

## 2022-02-26 DIAGNOSIS — Z7901 Long term (current) use of anticoagulants: Secondary | ICD-10-CM | POA: Insufficient documentation

## 2022-02-26 DIAGNOSIS — Z79899 Other long term (current) drug therapy: Secondary | ICD-10-CM | POA: Insufficient documentation

## 2022-02-26 DIAGNOSIS — M1990 Unspecified osteoarthritis, unspecified site: Secondary | ICD-10-CM | POA: Diagnosis not present

## 2022-02-26 DIAGNOSIS — E875 Hyperkalemia: Secondary | ICD-10-CM | POA: Diagnosis not present

## 2022-02-26 DIAGNOSIS — I82403 Acute embolism and thrombosis of unspecified deep veins of lower extremity, bilateral: Secondary | ICD-10-CM | POA: Diagnosis not present

## 2022-02-26 DIAGNOSIS — I129 Hypertensive chronic kidney disease with stage 1 through stage 4 chronic kidney disease, or unspecified chronic kidney disease: Secondary | ICD-10-CM | POA: Insufficient documentation

## 2022-02-26 DIAGNOSIS — E785 Hyperlipidemia, unspecified: Secondary | ICD-10-CM | POA: Diagnosis not present

## 2022-02-26 DIAGNOSIS — R739 Hyperglycemia, unspecified: Secondary | ICD-10-CM | POA: Diagnosis not present

## 2022-02-26 DIAGNOSIS — I10 Essential (primary) hypertension: Secondary | ICD-10-CM | POA: Diagnosis not present

## 2022-02-26 LAB — BASIC METABOLIC PANEL
Anion gap: 6 (ref 5–15)
BUN: 68 mg/dL — ABNORMAL HIGH (ref 8–23)
CO2: 22 mmol/L (ref 22–32)
Calcium: 8.4 mg/dL — ABNORMAL LOW (ref 8.9–10.3)
Chloride: 109 mmol/L (ref 98–111)
Creatinine, Ser: 2.09 mg/dL — ABNORMAL HIGH (ref 0.61–1.24)
GFR, Estimated: 31 mL/min — ABNORMAL LOW (ref >60)
Glucose, Bld: 359 mg/dL — ABNORMAL HIGH (ref 70–99)
Potassium: 4.1 mmol/L (ref 3.5–5.1)
Sodium: 137 mmol/L (ref 135–145)

## 2022-02-26 LAB — URINALYSIS, ROUTINE W REFLEX MICROSCOPIC
Bacteria, UA: NONE SEEN
Bilirubin Urine: NEGATIVE
Glucose, UA: >=500 mg/dL — AB
Hgb urine dipstick: NEGATIVE
Ketones, ur: NEGATIVE mg/dL
Leukocytes,Ua: NEGATIVE
Nitrite: NEGATIVE
Protein, ur: 100 mg/dL — AB
Specific Gravity, Urine: 1.011 (ref 1.005–1.030)
pH: 5 (ref 5.0–8.0)

## 2022-02-26 LAB — RETICULOCYTES
Immature Retic Fract: 21.9 % — ABNORMAL HIGH (ref 2.3–15.9)
RBC.: 2.6 MIL/uL — ABNORMAL LOW (ref 4.22–5.81)
Retic Count, Absolute: 45.2 10*3/uL (ref 19.0–186.0)
Retic Ct Pct: 1.7 % (ref 0.4–3.1)

## 2022-02-26 LAB — CBC WITH DIFFERENTIAL/PLATELET
Abs Immature Granulocytes: 0.1 10*3/uL — ABNORMAL HIGH (ref 0.00–0.07)
Basophils Absolute: 0 10*3/uL (ref 0.0–0.1)
Basophils Relative: 0 %
Eosinophils Absolute: 0.2 10*3/uL (ref 0.0–0.5)
Eosinophils Relative: 2 %
HCT: 24.8 % — ABNORMAL LOW (ref 39.0–52.0)
Hemoglobin: 7.4 g/dL — ABNORMAL LOW (ref 13.0–17.0)
Immature Granulocytes: 1 %
Lymphocytes Relative: 6 %
Lymphs Abs: 0.7 10*3/uL (ref 0.7–4.0)
MCH: 28.1 pg (ref 26.0–34.0)
MCHC: 29.8 g/dL — ABNORMAL LOW (ref 30.0–36.0)
MCV: 94.3 fL (ref 80.0–100.0)
Monocytes Absolute: 1 10*3/uL (ref 0.1–1.0)
Monocytes Relative: 9 %
Neutro Abs: 9.2 10*3/uL — ABNORMAL HIGH (ref 1.7–7.7)
Neutrophils Relative %: 82 %
Platelets: 436 10*3/uL — ABNORMAL HIGH (ref 150–400)
RBC: 2.63 MIL/uL — ABNORMAL LOW (ref 4.22–5.81)
RDW: 14.3 % (ref 11.5–15.5)
WBC: 11.3 10*3/uL — ABNORMAL HIGH (ref 4.0–10.5)
nRBC: 0 % (ref 0.0–0.2)

## 2022-02-26 LAB — VITAMIN B12: Vitamin B-12: 251 pg/mL (ref 180–914)

## 2022-02-26 LAB — FOLATE: Folate: 13.7 ng/mL (ref >5.9)

## 2022-02-26 LAB — IRON AND TIBC
Iron: 47 ug/dL (ref 45–182)
Saturation Ratios: 28 % (ref 17.9–39.5)
TIBC: 167 ug/dL — ABNORMAL LOW (ref 250–450)
UIBC: 120 ug/dL

## 2022-02-26 LAB — FERRITIN: Ferritin: 322 ng/mL (ref 24–336)

## 2022-02-26 LAB — POC OCCULT BLOOD, ED: Fecal Occult Bld: NEGATIVE

## 2022-02-26 LAB — PROTIME-INR
INR: 1.3 — ABNORMAL HIGH (ref 0.8–1.2)
Prothrombin Time: 16.5 seconds — ABNORMAL HIGH (ref 11.4–15.2)

## 2022-02-26 LAB — PREPARE RBC (CROSSMATCH)

## 2022-02-26 LAB — CBG MONITORING, ED: Glucose-Capillary: 312 mg/dL — ABNORMAL HIGH (ref 70–99)

## 2022-02-26 MED ORDER — ACETAMINOPHEN 325 MG PO TABS
650.0000 mg | ORAL_TABLET | Freq: Once | ORAL | Status: AC
  Administered 2022-02-26: 650 mg via ORAL
  Filled 2022-02-26: qty 2

## 2022-02-26 MED ORDER — SODIUM CHLORIDE 0.9 % IV SOLN
10.0000 mL/h | Freq: Once | INTRAVENOUS | Status: AC
  Administered 2022-02-26: 10 mL/h via INTRAVENOUS

## 2022-02-26 MED ORDER — INSULIN ASPART 100 UNIT/ML IJ SOLN
4.0000 [IU] | Freq: Once | INTRAMUSCULAR | Status: AC
  Administered 2022-02-26: 4 [IU] via INTRAVENOUS
  Filled 2022-02-26: qty 0.04

## 2022-02-26 NOTE — ED Triage Notes (Signed)
Patient presents from [facility] for a blood transfusion. Today his hemoglobin was 6.7.    HX diabetes type 2, bedsore on the left side of hi buttock, Surgery on right leg with post surgery weakness.    EMS vitals: 134/62 BP 90 HR 97% SPO2 on room air 402 CBG

## 2022-02-26 NOTE — ED Notes (Signed)
Blood bank has blood ready for this patient. Notified Sonya,RN.

## 2022-02-26 NOTE — ED Provider Notes (Signed)
New Site DEPT Provider Note   CSN: [id number] Arrival date & time: 02/26/22  1220     History  Chief Complaint  Patient presents with   Abnormal Lab    Jason Mcpherson is a 81 y.o. male.  Pt is a 81 yo male with pmhx significant for DM2, HTN, HLD, chronic anemia, CKD 3a.  Pt was admitted from 9/30-10/9 for a right hip fx (femur IM nail placed by Dr. Alvan Dame on 10/2).  He did develop a bilateral DVT and was started on Eliquis.  Pt did under go 2 units transfusion while admitted.  Pt's hgb 9.3 on 10/6 (after transfusion), but back down to 7.8 on 10/8, but that was thought to be due to hemodilution.  Plan was to transfuse if hgb dropped back down below 7.  Pt went to SNF after d/c.  Blood checked today and hgb 6.7, so he was sent in for a blood transfusion.  Pt denies any bleeding.  He has not looked at his stools.  His complaint is that his "butt" hurts.                     Home Medications Prior to Admission medications   Medication Sig Start Date End Date Taking? Authorizing Provider  acetaminophen (TYLENOL) 500 MG tablet Take 1,000 mg by mouth every 6 (six) hours as needed for mild pain.    [provider]  amLODipine (NORVASC) 5 MG tablet TAKE 1 TABLET (5 MG TOTAL) BY MOUTH DAILY. 07/05/20   Elayne Snare, MD  apixaban (ELIQUIS) 5 MG TABS tablet Take 2 tablets (10 mg total) by mouth 2 (two) times daily for 4 days. 02/07/22 02/11/22  Hosie Poisson, MD  apixaban (ELIQUIS) 5 MG TABS tablet Take 1 tablet (5 mg total) by mouth 2 (two) times daily. 02/12/22   Hosie Poisson, MD  bisacodyl (DULCOLAX) 10 MG suppository Place 1 suppository (10 mg total) rectally daily as needed for moderate constipation. 02/07/22   Hosie Poisson, MD  docusate sodium (COLACE) 100 MG capsule Take 1 capsule (100 mg total) by mouth 2 (two) times daily. 02/07/22   Hosie Poisson, MD  Ensure Max Protein (ENSURE MAX PROTEIN) LIQD Take 330 mLs (11 oz total) by mouth 2 (two)  times daily. 02/07/22 02/07/23  Hosie Poisson, MD  FARXIGA 5 MG TABS tablet Take 5 mg by mouth daily. 01/15/22   [provider]  ferrous sulfate 325 (65 FE) MG tablet Take 325 mg by mouth every morning. 12/28/21   [provider]  glimepiride (AMARYL) 1 MG tablet Take 1.5 mg by mouth every morning. 12/11/21   [provider]  glucose blood (ONETOUCH ULTRA) test strip USE AS DIRECTED TO TEST TWICE DAILY 05/13/20   Elayne Snare, MD  HYDROcodone-acetaminophen (NORCO/VICODIN) 5-325 MG tablet Take 1 tablet by mouth every 4 (four) hours as needed for moderate pain (pain score 4-6). 02/03/22   Paralee Cancel, MD  Lancets [facility] DELICA PLUS [id number]) [place] 2X DAILY. 06/06/20   Elayne Snare, MD  Multiple Vitamin (MULTIVITAMIN WITH MINERALS) TABS tablet Take 1 tablet by mouth daily. 02/08/22   Hosie Poisson, MD  [facility] DELICA LANCETS FINE MISC USED TO CHECK SUGAR 2X DAILY. 12/28/16   Elayne Snare, MD  polyethylene glycol (MIRALAX / GLYCOLAX) 17 g packet Take 17 g by mouth daily as needed for mild constipation. 02/07/22   Hosie Poisson, MD  simvastatin (ZOCOR) 20 MG tablet TAKE 1 TABLET BY MOUTH  EVERY DAY IN THE EVENING Patient taking differently: Take 20 mg by mouth every evening. 03/30/20   Elayne Snare, MD  torsemide (DEMADEX) 10 MG tablet Take 10 mg by mouth daily as needed for edema. 10/19/21   [provider]  Vitamin D, Ergocalciferol, (DRISDOL) 1.25 MG (50000 UNIT) CAPS capsule Take 50,000 Units by mouth once a week. Saturday 01/05/22   [provider]      Allergies    Amlodipine    Review of Systems   Review of Systems  Neurological:  Positive for weakness.  All other systems reviewed and are negative.   Physical Exam Updated Vital Signs BP (!) 114/55   Pulse 84   Temp 97.7 F (36.5 C) (Oral)   Resp 16   SpO2 100%  Physical Exam Vitals and nursing note reviewed.  Constitutional:      Appearance: Normal appearance.  HENT:      Head: Normocephalic and atraumatic.     Right Ear: External ear normal.     Left Ear: External ear normal.     Nose: Nose normal.     Mouth/Throat:     Mouth: Mucous membranes are moist.     Pharynx: Oropharynx is clear.  Eyes:     Extraocular Movements: Extraocular movements intact.     Conjunctiva/sclera: Conjunctivae normal.     Pupils: Pupils are equal, round, and reactive to light.  Cardiovascular:     Rate and Rhythm: Normal rate and regular rhythm.     Pulses: Normal pulses.     Heart sounds: Normal heart sounds.  Pulmonary:     Effort: Pulmonary effort is normal.     Breath sounds: Normal breath sounds.  Abdominal:     General: Abdomen is flat. Bowel sounds are normal.     Palpations: Abdomen is soft.  Genitourinary:    Rectum: Guaiac result negative.  Musculoskeletal:        General: Normal range of motion.     Cervical back: Normal range of motion and neck supple.  Skin:    Capillary Refill: Capillary refill takes less than 2 seconds.     Comments: Stage 2-3 sacral and coccyx deubs  Neurological:     General: No focal deficit present.     Mental Status: He is alert and oriented to person, place, and time.  Psychiatric:        Mood and Affect: Mood normal.        Behavior: Behavior normal.     ED Results / Procedures / Treatments   Labs (all labs ordered are listed, but only abnormal results are displayed) Labs Reviewed  BASIC METABOLIC PANEL - Abnormal; Notable for the following components:      Result Value   Glucose, Bld 359 (*)    BUN 68 (*)    Creatinine, Ser 2.09 (*)    Calcium 8.4 (*)    GFR, Estimated 31 (*)    All other components within normal limits  CBC WITH DIFFERENTIAL/PLATELET - Abnormal; Notable for the following components:   WBC 11.3 (*)    RBC 2.63 (*)    Hemoglobin 7.4 (*)    HCT 24.8 (*)    MCHC 29.8 (*)    Platelets 436 (*)    Neutro Abs 9.2 (*)    Abs Immature Granulocytes 0.10 (*)    All other components within normal limits   PROTIME-INR - Abnormal; Notable for the following components:   Prothrombin Time 16.5 (*)    INR  1.3 (*)    All other components within normal limits  IRON AND TIBC - Abnormal; Notable for the following components:   TIBC 167 (*)    All other components within normal limits  RETICULOCYTES - Abnormal; Notable for the following components:   RBC. 2.60 (*)    Immature Retic Fract 21.9 (*)    All other components within normal limits  CBG MONITORING, ED - Abnormal; Notable for the following components:   Glucose-Capillary 312 (*)    All other components within normal limits  VITAMIN B12  FOLATE  FERRITIN  URINALYSIS, ROUTINE W REFLEX MICROSCOPIC  POC OCCULT BLOOD, ED  TYPE AND SCREEN  PREPARE RBC (CROSSMATCH)    EKG EKG Interpretation  Date/Time:  Friday February 26 2022 12:58:00 EDT Ventricular Rate:  83 PR Interval:  192 QRS Duration: 92 QT Interval:  380 QTC Calculation: 447 R Axis:   25 Text Interpretation: Sinus rhythm No significant change since last tracing Confirmed by Isla Pence [phone]) on 02/26/2022 1:55:25 PM  Radiology DG Chest Portable 1 View  Result Date: 02/26/2022 CLINICAL DATA:  Anemia EXAM: PORTABLE CHEST 1 VIEW COMPARISON:  Previous studies including the examination of 02/04/2022 FINDINGS: Cardiac size is within normal limits. There are no signs of pulmonary edema. Increased markings are seen in both parahilar regions and both lower lung fields suggesting multifocal atelectasis/pneumonia. There is no significant pleural effusion or pneumothorax. There is surgical fusion in cervical spine. IMPRESSION: Increased markings are seen in parahilar regions and lower lung fields suggesting atelectasis/pneumonia. Electronically Signed   By: Elmer Picker M.D.   On: 02/26/2022 13:20    Procedures Procedures    Medications Ordered in ED Medications  0.9 %  sodium chloride infusion (has no administration in time range)  acetaminophen (TYLENOL) tablet 650  mg (650 mg Oral Given 02/26/22 1447)  insulin aspart (novoLOG) injection 4 Units (4 Units Intravenous Given 02/26/22 1443)    ED Course/ Medical Decision Making/ A&P                           Medical Decision Making Amount and/or Complexity of Data Reviewed Labs: ordered. Radiology: ordered.  Risk OTC drugs. Prescription drug management.   This patient presents to the ED for concern of anemia, this involves an extensive number of treatment options, and is a complaint that carries with it a high risk of complications and morbidity.  The differential diagnosis includes gi bleed,    Co morbidities that complicate the patient evaluation  DM2, HTN, HLD, chronic anemia, CKD 3a   Additional history obtained:  Additional history obtained from epic chart review External records from outside source obtained and reviewed including EMS report   Lab Tests:  I Ordered, and personally interpreted labs.  The pertinent results include:  cbc with hgb 7.4 which is stable from last hgb on 10/8; bmp with glucose 359 and bun 68 and cr 2.09; inr 1.3   Imaging Studies ordered:  I ordered imaging studies including cxr  I independently visualized and interpreted imaging which showed  IMPRESSION:  Increased markings are seen in parahilar regions and lower lung  fields suggesting atelectasis/pneumonia.   I agree with the radiologist interpretation   Cardiac Monitoring:  The patient was maintained on a cardiac monitor.  I personally viewed and interpreted the cardiac monitored which showed an underlying rhythm of: nsr   Medicines ordered and prescription drug management:  I ordered medication including 1 unit prbc  for anemia  Reevaluation of the patient after these medicines showed that the patient improved I have reviewed the patients home medicines and have made adjustments as needed  Critical Interventions:  transfusion   Problem List / ED Course:  Anemia:  pt's hgb is stable,  but he is still weak and the anemia may be contributing to this weakness.  It also may be contributing to his sacral decub not healing well.  So, I am going to give him 1 unit.  Pt is guaiac neg. Anemia panel pending.  I think he can stay on Eliquis as it does not seem like he has any active bleeding. Sacral decub: wound care referral ordered CKD:  pt does see nephrology.     Reevaluation:  After the interventions noted above, I reevaluated the patient and found that they have :improved   Social Determinants of Health:  Lives in snf   Dispostion:  After consideration of the diagnostic results and the patients response to treatment, I feel that the patent would benefit from discharge with outpatient f/u.          Final Clinical Impression(s) / ED Diagnoses Final diagnoses:  Symptomatic anemia  Pressure injury of sacral region, stage 2 (HCC)  Stage 3b chronic kidney disease ([place])  Hyperglycemia due to diabetes mellitus [facility])    Rx / DC Orders ED Discharge Orders          Ordered    Ambulatory referral to Wound Clinic        02/26/22 1357              Isla Pence, MD 02/26/22 1523

## 2022-02-26 NOTE — ED Notes (Signed)
Family updated on patient status.

## 2022-02-27 DIAGNOSIS — E11622 Type 2 diabetes mellitus with other skin ulcer: Secondary | ICD-10-CM | POA: Diagnosis not present

## 2022-02-27 DIAGNOSIS — R2689 Other abnormalities of gait and mobility: Secondary | ICD-10-CM | POA: Diagnosis not present

## 2022-02-27 DIAGNOSIS — L89313 Pressure ulcer of right buttock, stage 3: Secondary | ICD-10-CM | POA: Diagnosis not present

## 2022-02-27 DIAGNOSIS — L89323 Pressure ulcer of left buttock, stage 3: Secondary | ICD-10-CM | POA: Diagnosis not present

## 2022-02-27 DIAGNOSIS — Z743 Need for continuous supervision: Secondary | ICD-10-CM | POA: Diagnosis not present

## 2022-02-27 DIAGNOSIS — E559 Vitamin D deficiency, unspecified: Secondary | ICD-10-CM | POA: Diagnosis not present

## 2022-02-27 DIAGNOSIS — I129 Hypertensive chronic kidney disease with stage 1 through stage 4 chronic kidney disease, or unspecified chronic kidney disease: Secondary | ICD-10-CM | POA: Diagnosis not present

## 2022-02-27 DIAGNOSIS — M6281 Muscle weakness (generalized): Secondary | ICD-10-CM | POA: Diagnosis not present

## 2022-02-27 DIAGNOSIS — E1122 Type 2 diabetes mellitus with diabetic chronic kidney disease: Secondary | ICD-10-CM | POA: Diagnosis not present

## 2022-02-27 DIAGNOSIS — E119 Type 2 diabetes mellitus without complications: Secondary | ICD-10-CM | POA: Diagnosis not present

## 2022-02-27 DIAGNOSIS — S72144D Nondisplaced intertrochanteric fracture of right femur, subsequent encounter for closed fracture with routine healing: Secondary | ICD-10-CM | POA: Diagnosis not present

## 2022-02-27 DIAGNOSIS — M25561 Pain in right knee: Secondary | ICD-10-CM | POA: Diagnosis not present

## 2022-02-27 DIAGNOSIS — R531 Weakness: Secondary | ICD-10-CM | POA: Diagnosis not present

## 2022-02-27 DIAGNOSIS — L89893 Pressure ulcer of other site, stage 3: Secondary | ICD-10-CM | POA: Diagnosis not present

## 2022-02-27 DIAGNOSIS — Z4789 Encounter for other orthopedic aftercare: Secondary | ICD-10-CM | POA: Diagnosis not present

## 2022-02-27 DIAGNOSIS — Z7901 Long term (current) use of anticoagulants: Secondary | ICD-10-CM | POA: Diagnosis not present

## 2022-02-27 DIAGNOSIS — Z7401 Bed confinement status: Secondary | ICD-10-CM | POA: Diagnosis not present

## 2022-02-27 DIAGNOSIS — D649 Anemia, unspecified: Secondary | ICD-10-CM | POA: Diagnosis not present

## 2022-02-27 DIAGNOSIS — S72001A Fracture of unspecified part of neck of right femur, initial encounter for closed fracture: Secondary | ICD-10-CM | POA: Diagnosis not present

## 2022-02-27 DIAGNOSIS — L89152 Pressure ulcer of sacral region, stage 2: Secondary | ICD-10-CM | POA: Diagnosis not present

## 2022-02-27 DIAGNOSIS — M25551 Pain in right hip: Secondary | ICD-10-CM | POA: Diagnosis not present

## 2022-02-27 DIAGNOSIS — E46 Unspecified protein-calorie malnutrition: Secondary | ICD-10-CM | POA: Diagnosis not present

## 2022-02-27 DIAGNOSIS — N1831 Chronic kidney disease, stage 3a: Secondary | ICD-10-CM | POA: Diagnosis not present

## 2022-02-27 DIAGNOSIS — R269 Unspecified abnormalities of gait and mobility: Secondary | ICD-10-CM | POA: Diagnosis not present

## 2022-02-27 DIAGNOSIS — N1832 Chronic kidney disease, stage 3b: Secondary | ICD-10-CM | POA: Diagnosis not present

## 2022-02-27 DIAGNOSIS — Z79899 Other long term (current) drug therapy: Secondary | ICD-10-CM | POA: Diagnosis not present

## 2022-02-27 DIAGNOSIS — I82403 Acute embolism and thrombosis of unspecified deep veins of lower extremity, bilateral: Secondary | ICD-10-CM | POA: Diagnosis not present

## 2022-02-27 DIAGNOSIS — Z7984 Long term (current) use of oral hypoglycemic drugs: Secondary | ICD-10-CM | POA: Diagnosis not present

## 2022-02-27 DIAGNOSIS — E785 Hyperlipidemia, unspecified: Secondary | ICD-10-CM | POA: Diagnosis not present

## 2022-02-27 DIAGNOSIS — M1990 Unspecified osteoarthritis, unspecified site: Secondary | ICD-10-CM | POA: Diagnosis not present

## 2022-02-27 DIAGNOSIS — N189 Chronic kidney disease, unspecified: Secondary | ICD-10-CM | POA: Diagnosis not present

## 2022-02-27 DIAGNOSIS — I10 Essential (primary) hypertension: Secondary | ICD-10-CM | POA: Diagnosis not present

## 2022-02-27 DIAGNOSIS — E1165 Type 2 diabetes mellitus with hyperglycemia: Secondary | ICD-10-CM | POA: Diagnosis not present

## 2022-02-27 LAB — TYPE AND SCREEN
ABO/RH(D): O POS
Antibody Screen: NEGATIVE
Unit division: 0

## 2022-02-27 LAB — BPAM RBC
Blood Product Expiration Date: 202311262359
ISSUE DATE / TIME: 202310271511
Unit Type and Rh: 5100

## 2022-03-03 DIAGNOSIS — M6281 Muscle weakness (generalized): Secondary | ICD-10-CM | POA: Diagnosis not present

## 2022-03-03 DIAGNOSIS — L89323 Pressure ulcer of left buttock, stage 3: Secondary | ICD-10-CM | POA: Diagnosis not present

## 2022-03-03 DIAGNOSIS — L89313 Pressure ulcer of right buttock, stage 3: Secondary | ICD-10-CM | POA: Diagnosis not present

## 2022-03-03 DIAGNOSIS — E11622 Type 2 diabetes mellitus with other skin ulcer: Secondary | ICD-10-CM | POA: Diagnosis not present

## 2022-03-04 DIAGNOSIS — M1990 Unspecified osteoarthritis, unspecified site: Secondary | ICD-10-CM | POA: Diagnosis not present

## 2022-03-04 DIAGNOSIS — N189 Chronic kidney disease, unspecified: Secondary | ICD-10-CM | POA: Diagnosis not present

## 2022-03-04 DIAGNOSIS — I82403 Acute embolism and thrombosis of unspecified deep veins of lower extremity, bilateral: Secondary | ICD-10-CM | POA: Diagnosis not present

## 2022-03-04 DIAGNOSIS — I10 Essential (primary) hypertension: Secondary | ICD-10-CM | POA: Diagnosis not present

## 2022-03-04 DIAGNOSIS — E785 Hyperlipidemia, unspecified: Secondary | ICD-10-CM | POA: Diagnosis not present

## 2022-03-04 DIAGNOSIS — D649 Anemia, unspecified: Secondary | ICD-10-CM | POA: Diagnosis not present

## 2022-03-04 DIAGNOSIS — E119 Type 2 diabetes mellitus without complications: Secondary | ICD-10-CM | POA: Diagnosis not present

## 2022-03-08 DIAGNOSIS — I10 Essential (primary) hypertension: Secondary | ICD-10-CM | POA: Diagnosis not present

## 2022-03-08 DIAGNOSIS — E785 Hyperlipidemia, unspecified: Secondary | ICD-10-CM | POA: Diagnosis not present

## 2022-03-08 DIAGNOSIS — N189 Chronic kidney disease, unspecified: Secondary | ICD-10-CM | POA: Diagnosis not present

## 2022-03-08 DIAGNOSIS — E119 Type 2 diabetes mellitus without complications: Secondary | ICD-10-CM | POA: Diagnosis not present

## 2022-03-08 DIAGNOSIS — E46 Unspecified protein-calorie malnutrition: Secondary | ICD-10-CM | POA: Diagnosis not present

## 2022-03-08 DIAGNOSIS — M1990 Unspecified osteoarthritis, unspecified site: Secondary | ICD-10-CM | POA: Diagnosis not present

## 2022-03-08 DIAGNOSIS — E559 Vitamin D deficiency, unspecified: Secondary | ICD-10-CM | POA: Diagnosis not present

## 2022-03-08 DIAGNOSIS — I82403 Acute embolism and thrombosis of unspecified deep veins of lower extremity, bilateral: Secondary | ICD-10-CM | POA: Diagnosis not present

## 2022-03-12 DIAGNOSIS — M6281 Muscle weakness (generalized): Secondary | ICD-10-CM | POA: Diagnosis not present

## 2022-03-12 DIAGNOSIS — R269 Unspecified abnormalities of gait and mobility: Secondary | ICD-10-CM | POA: Diagnosis not present

## 2022-03-12 DIAGNOSIS — L89313 Pressure ulcer of right buttock, stage 3: Secondary | ICD-10-CM | POA: Diagnosis not present

## 2022-03-12 DIAGNOSIS — L89893 Pressure ulcer of other site, stage 3: Secondary | ICD-10-CM | POA: Diagnosis not present

## 2022-03-17 DIAGNOSIS — M25561 Pain in right knee: Secondary | ICD-10-CM | POA: Diagnosis not present

## 2022-03-17 DIAGNOSIS — Z4789 Encounter for other orthopedic aftercare: Secondary | ICD-10-CM | POA: Diagnosis not present

## 2022-03-23 DIAGNOSIS — L89893 Pressure ulcer of other site, stage 3: Secondary | ICD-10-CM | POA: Diagnosis not present

## 2022-03-23 DIAGNOSIS — M6281 Muscle weakness (generalized): Secondary | ICD-10-CM | POA: Diagnosis not present

## 2022-03-23 DIAGNOSIS — L89313 Pressure ulcer of right buttock, stage 3: Secondary | ICD-10-CM | POA: Diagnosis not present

## 2022-03-23 DIAGNOSIS — R269 Unspecified abnormalities of gait and mobility: Secondary | ICD-10-CM | POA: Diagnosis not present

## 2022-03-24 DIAGNOSIS — I10 Essential (primary) hypertension: Secondary | ICD-10-CM | POA: Diagnosis not present

## 2022-03-24 DIAGNOSIS — I82403 Acute embolism and thrombosis of unspecified deep veins of lower extremity, bilateral: Secondary | ICD-10-CM | POA: Diagnosis not present

## 2022-03-24 DIAGNOSIS — E46 Unspecified protein-calorie malnutrition: Secondary | ICD-10-CM | POA: Diagnosis not present

## 2022-03-24 DIAGNOSIS — E785 Hyperlipidemia, unspecified: Secondary | ICD-10-CM | POA: Diagnosis not present

## 2022-03-24 DIAGNOSIS — N189 Chronic kidney disease, unspecified: Secondary | ICD-10-CM | POA: Diagnosis not present

## 2022-03-24 DIAGNOSIS — E119 Type 2 diabetes mellitus without complications: Secondary | ICD-10-CM | POA: Diagnosis not present

## 2022-03-24 DIAGNOSIS — M1990 Unspecified osteoarthritis, unspecified site: Secondary | ICD-10-CM | POA: Diagnosis not present

## 2022-03-24 DIAGNOSIS — E559 Vitamin D deficiency, unspecified: Secondary | ICD-10-CM | POA: Diagnosis not present

## 2022-03-26 DIAGNOSIS — L89313 Pressure ulcer of right buttock, stage 3: Secondary | ICD-10-CM | POA: Diagnosis not present

## 2022-03-26 DIAGNOSIS — L89323 Pressure ulcer of left buttock, stage 3: Secondary | ICD-10-CM | POA: Diagnosis not present

## 2022-03-27 DIAGNOSIS — M1990 Unspecified osteoarthritis, unspecified site: Secondary | ICD-10-CM | POA: Diagnosis not present

## 2022-03-27 DIAGNOSIS — Z7984 Long term (current) use of oral hypoglycemic drugs: Secondary | ICD-10-CM | POA: Diagnosis not present

## 2022-03-27 DIAGNOSIS — M25561 Pain in right knee: Secondary | ICD-10-CM | POA: Diagnosis not present

## 2022-03-27 DIAGNOSIS — Z7901 Long term (current) use of anticoagulants: Secondary | ICD-10-CM | POA: Diagnosis not present

## 2022-03-27 DIAGNOSIS — E46 Unspecified protein-calorie malnutrition: Secondary | ICD-10-CM | POA: Diagnosis not present

## 2022-03-27 DIAGNOSIS — N1831 Chronic kidney disease, stage 3a: Secondary | ICD-10-CM | POA: Diagnosis not present

## 2022-03-27 DIAGNOSIS — I129 Hypertensive chronic kidney disease with stage 1 through stage 4 chronic kidney disease, or unspecified chronic kidney disease: Secondary | ICD-10-CM | POA: Diagnosis not present

## 2022-03-27 DIAGNOSIS — E1122 Type 2 diabetes mellitus with diabetic chronic kidney disease: Secondary | ICD-10-CM | POA: Diagnosis not present

## 2022-03-27 DIAGNOSIS — Z87891 Personal history of nicotine dependence: Secondary | ICD-10-CM | POA: Diagnosis not present

## 2022-03-27 DIAGNOSIS — Z9181 History of falling: Secondary | ICD-10-CM | POA: Diagnosis not present

## 2022-03-27 DIAGNOSIS — Z6823 Body mass index (BMI) 23.0-23.9, adult: Secondary | ICD-10-CM | POA: Diagnosis not present

## 2022-03-27 DIAGNOSIS — D631 Anemia in chronic kidney disease: Secondary | ICD-10-CM | POA: Diagnosis not present

## 2022-03-27 DIAGNOSIS — S72141D Displaced intertrochanteric fracture of right femur, subsequent encounter for closed fracture with routine healing: Secondary | ICD-10-CM | POA: Diagnosis not present

## 2022-03-27 DIAGNOSIS — H9193 Unspecified hearing loss, bilateral: Secondary | ICD-10-CM | POA: Diagnosis not present

## 2022-03-27 DIAGNOSIS — G8929 Other chronic pain: Secondary | ICD-10-CM | POA: Diagnosis not present

## 2022-03-27 DIAGNOSIS — Z4789 Encounter for other orthopedic aftercare: Secondary | ICD-10-CM | POA: Diagnosis not present

## 2022-03-27 DIAGNOSIS — M4802 Spinal stenosis, cervical region: Secondary | ICD-10-CM | POA: Diagnosis not present

## 2022-03-27 DIAGNOSIS — Z993 Dependence on wheelchair: Secondary | ICD-10-CM | POA: Diagnosis not present

## 2022-03-27 DIAGNOSIS — E785 Hyperlipidemia, unspecified: Secondary | ICD-10-CM | POA: Diagnosis not present

## 2022-04-01 DIAGNOSIS — Z7901 Long term (current) use of anticoagulants: Secondary | ICD-10-CM | POA: Diagnosis not present

## 2022-04-01 DIAGNOSIS — E1122 Type 2 diabetes mellitus with diabetic chronic kidney disease: Secondary | ICD-10-CM | POA: Diagnosis not present

## 2022-04-01 DIAGNOSIS — H9193 Unspecified hearing loss, bilateral: Secondary | ICD-10-CM | POA: Diagnosis not present

## 2022-04-01 DIAGNOSIS — M1990 Unspecified osteoarthritis, unspecified site: Secondary | ICD-10-CM | POA: Diagnosis not present

## 2022-04-01 DIAGNOSIS — G8929 Other chronic pain: Secondary | ICD-10-CM | POA: Diagnosis not present

## 2022-04-01 DIAGNOSIS — I129 Hypertensive chronic kidney disease with stage 1 through stage 4 chronic kidney disease, or unspecified chronic kidney disease: Secondary | ICD-10-CM | POA: Diagnosis not present

## 2022-04-01 DIAGNOSIS — Z87891 Personal history of nicotine dependence: Secondary | ICD-10-CM | POA: Diagnosis not present

## 2022-04-01 DIAGNOSIS — E46 Unspecified protein-calorie malnutrition: Secondary | ICD-10-CM | POA: Diagnosis not present

## 2022-04-01 DIAGNOSIS — M25561 Pain in right knee: Secondary | ICD-10-CM | POA: Diagnosis not present

## 2022-04-01 DIAGNOSIS — M4802 Spinal stenosis, cervical region: Secondary | ICD-10-CM | POA: Diagnosis not present

## 2022-04-01 DIAGNOSIS — S72141D Displaced intertrochanteric fracture of right femur, subsequent encounter for closed fracture with routine healing: Secondary | ICD-10-CM | POA: Diagnosis not present

## 2022-04-01 DIAGNOSIS — Z9181 History of falling: Secondary | ICD-10-CM | POA: Diagnosis not present

## 2022-04-01 DIAGNOSIS — Z993 Dependence on wheelchair: Secondary | ICD-10-CM | POA: Diagnosis not present

## 2022-04-01 DIAGNOSIS — D631 Anemia in chronic kidney disease: Secondary | ICD-10-CM | POA: Diagnosis not present

## 2022-04-01 DIAGNOSIS — Z6823 Body mass index (BMI) 23.0-23.9, adult: Secondary | ICD-10-CM | POA: Diagnosis not present

## 2022-04-01 DIAGNOSIS — N1831 Chronic kidney disease, stage 3a: Secondary | ICD-10-CM | POA: Diagnosis not present

## 2022-04-01 DIAGNOSIS — E785 Hyperlipidemia, unspecified: Secondary | ICD-10-CM | POA: Diagnosis not present

## 2022-04-01 DIAGNOSIS — Z7984 Long term (current) use of oral hypoglycemic drugs: Secondary | ICD-10-CM | POA: Diagnosis not present

## 2022-04-06 DIAGNOSIS — H9193 Unspecified hearing loss, bilateral: Secondary | ICD-10-CM | POA: Diagnosis not present

## 2022-04-06 DIAGNOSIS — Z7901 Long term (current) use of anticoagulants: Secondary | ICD-10-CM | POA: Diagnosis not present

## 2022-04-06 DIAGNOSIS — E785 Hyperlipidemia, unspecified: Secondary | ICD-10-CM | POA: Diagnosis not present

## 2022-04-06 DIAGNOSIS — G8929 Other chronic pain: Secondary | ICD-10-CM | POA: Diagnosis not present

## 2022-04-06 DIAGNOSIS — E46 Unspecified protein-calorie malnutrition: Secondary | ICD-10-CM | POA: Diagnosis not present

## 2022-04-06 DIAGNOSIS — E1122 Type 2 diabetes mellitus with diabetic chronic kidney disease: Secondary | ICD-10-CM | POA: Diagnosis not present

## 2022-04-06 DIAGNOSIS — M25561 Pain in right knee: Secondary | ICD-10-CM | POA: Diagnosis not present

## 2022-04-06 DIAGNOSIS — M4802 Spinal stenosis, cervical region: Secondary | ICD-10-CM | POA: Diagnosis not present

## 2022-04-06 DIAGNOSIS — Z9181 History of falling: Secondary | ICD-10-CM | POA: Diagnosis not present

## 2022-04-06 DIAGNOSIS — I129 Hypertensive chronic kidney disease with stage 1 through stage 4 chronic kidney disease, or unspecified chronic kidney disease: Secondary | ICD-10-CM | POA: Diagnosis not present

## 2022-04-06 DIAGNOSIS — S72141D Displaced intertrochanteric fracture of right femur, subsequent encounter for closed fracture with routine healing: Secondary | ICD-10-CM | POA: Diagnosis not present

## 2022-04-06 DIAGNOSIS — Z7984 Long term (current) use of oral hypoglycemic drugs: Secondary | ICD-10-CM | POA: Diagnosis not present

## 2022-04-06 DIAGNOSIS — N1831 Chronic kidney disease, stage 3a: Secondary | ICD-10-CM | POA: Diagnosis not present

## 2022-04-06 DIAGNOSIS — Z993 Dependence on wheelchair: Secondary | ICD-10-CM | POA: Diagnosis not present

## 2022-04-06 DIAGNOSIS — M1990 Unspecified osteoarthritis, unspecified site: Secondary | ICD-10-CM | POA: Diagnosis not present

## 2022-04-06 DIAGNOSIS — Z6823 Body mass index (BMI) 23.0-23.9, adult: Secondary | ICD-10-CM | POA: Diagnosis not present

## 2022-04-06 DIAGNOSIS — D631 Anemia in chronic kidney disease: Secondary | ICD-10-CM | POA: Diagnosis not present

## 2022-04-06 DIAGNOSIS — Z87891 Personal history of nicotine dependence: Secondary | ICD-10-CM | POA: Diagnosis not present

## 2022-04-08 DIAGNOSIS — M4802 Spinal stenosis, cervical region: Secondary | ICD-10-CM | POA: Diagnosis not present

## 2022-04-08 DIAGNOSIS — H9193 Unspecified hearing loss, bilateral: Secondary | ICD-10-CM | POA: Diagnosis not present

## 2022-04-08 DIAGNOSIS — E46 Unspecified protein-calorie malnutrition: Secondary | ICD-10-CM | POA: Diagnosis not present

## 2022-04-08 DIAGNOSIS — I129 Hypertensive chronic kidney disease with stage 1 through stage 4 chronic kidney disease, or unspecified chronic kidney disease: Secondary | ICD-10-CM | POA: Diagnosis not present

## 2022-04-08 DIAGNOSIS — Z7984 Long term (current) use of oral hypoglycemic drugs: Secondary | ICD-10-CM | POA: Diagnosis not present

## 2022-04-08 DIAGNOSIS — D631 Anemia in chronic kidney disease: Secondary | ICD-10-CM | POA: Diagnosis not present

## 2022-04-08 DIAGNOSIS — M25561 Pain in right knee: Secondary | ICD-10-CM | POA: Diagnosis not present

## 2022-04-08 DIAGNOSIS — G8929 Other chronic pain: Secondary | ICD-10-CM | POA: Diagnosis not present

## 2022-04-08 DIAGNOSIS — Z9181 History of falling: Secondary | ICD-10-CM | POA: Diagnosis not present

## 2022-04-08 DIAGNOSIS — Z87891 Personal history of nicotine dependence: Secondary | ICD-10-CM | POA: Diagnosis not present

## 2022-04-08 DIAGNOSIS — E1122 Type 2 diabetes mellitus with diabetic chronic kidney disease: Secondary | ICD-10-CM | POA: Diagnosis not present

## 2022-04-08 DIAGNOSIS — Z7901 Long term (current) use of anticoagulants: Secondary | ICD-10-CM | POA: Diagnosis not present

## 2022-04-08 DIAGNOSIS — E785 Hyperlipidemia, unspecified: Secondary | ICD-10-CM | POA: Diagnosis not present

## 2022-04-08 DIAGNOSIS — Z993 Dependence on wheelchair: Secondary | ICD-10-CM | POA: Diagnosis not present

## 2022-04-08 DIAGNOSIS — M1990 Unspecified osteoarthritis, unspecified site: Secondary | ICD-10-CM | POA: Diagnosis not present

## 2022-04-08 DIAGNOSIS — N1831 Chronic kidney disease, stage 3a: Secondary | ICD-10-CM | POA: Diagnosis not present

## 2022-04-08 DIAGNOSIS — S72141D Displaced intertrochanteric fracture of right femur, subsequent encounter for closed fracture with routine healing: Secondary | ICD-10-CM | POA: Diagnosis not present

## 2022-04-08 DIAGNOSIS — Z6823 Body mass index (BMI) 23.0-23.9, adult: Secondary | ICD-10-CM | POA: Diagnosis not present

## 2022-04-13 DIAGNOSIS — I129 Hypertensive chronic kidney disease with stage 1 through stage 4 chronic kidney disease, or unspecified chronic kidney disease: Secondary | ICD-10-CM | POA: Diagnosis not present

## 2022-04-13 DIAGNOSIS — H9193 Unspecified hearing loss, bilateral: Secondary | ICD-10-CM | POA: Diagnosis not present

## 2022-04-13 DIAGNOSIS — M1990 Unspecified osteoarthritis, unspecified site: Secondary | ICD-10-CM | POA: Diagnosis not present

## 2022-04-13 DIAGNOSIS — Z7901 Long term (current) use of anticoagulants: Secondary | ICD-10-CM | POA: Diagnosis not present

## 2022-04-13 DIAGNOSIS — D631 Anemia in chronic kidney disease: Secondary | ICD-10-CM | POA: Diagnosis not present

## 2022-04-13 DIAGNOSIS — Z6823 Body mass index (BMI) 23.0-23.9, adult: Secondary | ICD-10-CM | POA: Diagnosis not present

## 2022-04-13 DIAGNOSIS — E1122 Type 2 diabetes mellitus with diabetic chronic kidney disease: Secondary | ICD-10-CM | POA: Diagnosis not present

## 2022-04-13 DIAGNOSIS — E46 Unspecified protein-calorie malnutrition: Secondary | ICD-10-CM | POA: Diagnosis not present

## 2022-04-13 DIAGNOSIS — E785 Hyperlipidemia, unspecified: Secondary | ICD-10-CM | POA: Diagnosis not present

## 2022-04-13 DIAGNOSIS — Z993 Dependence on wheelchair: Secondary | ICD-10-CM | POA: Diagnosis not present

## 2022-04-13 DIAGNOSIS — Z87891 Personal history of nicotine dependence: Secondary | ICD-10-CM | POA: Diagnosis not present

## 2022-04-13 DIAGNOSIS — M25561 Pain in right knee: Secondary | ICD-10-CM | POA: Diagnosis not present

## 2022-04-13 DIAGNOSIS — G8929 Other chronic pain: Secondary | ICD-10-CM | POA: Diagnosis not present

## 2022-04-13 DIAGNOSIS — Z7984 Long term (current) use of oral hypoglycemic drugs: Secondary | ICD-10-CM | POA: Diagnosis not present

## 2022-04-13 DIAGNOSIS — M4802 Spinal stenosis, cervical region: Secondary | ICD-10-CM | POA: Diagnosis not present

## 2022-04-13 DIAGNOSIS — N1831 Chronic kidney disease, stage 3a: Secondary | ICD-10-CM | POA: Diagnosis not present

## 2022-04-13 DIAGNOSIS — Z9181 History of falling: Secondary | ICD-10-CM | POA: Diagnosis not present

## 2022-04-13 DIAGNOSIS — S72141D Displaced intertrochanteric fracture of right femur, subsequent encounter for closed fracture with routine healing: Secondary | ICD-10-CM | POA: Diagnosis not present

## 2022-04-15 DIAGNOSIS — Z7901 Long term (current) use of anticoagulants: Secondary | ICD-10-CM | POA: Diagnosis not present

## 2022-04-15 DIAGNOSIS — Z7984 Long term (current) use of oral hypoglycemic drugs: Secondary | ICD-10-CM | POA: Diagnosis not present

## 2022-04-15 DIAGNOSIS — H9193 Unspecified hearing loss, bilateral: Secondary | ICD-10-CM | POA: Diagnosis not present

## 2022-04-15 DIAGNOSIS — S72141D Displaced intertrochanteric fracture of right femur, subsequent encounter for closed fracture with routine healing: Secondary | ICD-10-CM | POA: Diagnosis not present

## 2022-04-15 DIAGNOSIS — E785 Hyperlipidemia, unspecified: Secondary | ICD-10-CM | POA: Diagnosis not present

## 2022-04-15 DIAGNOSIS — Z87891 Personal history of nicotine dependence: Secondary | ICD-10-CM | POA: Diagnosis not present

## 2022-04-15 DIAGNOSIS — Z9181 History of falling: Secondary | ICD-10-CM | POA: Diagnosis not present

## 2022-04-15 DIAGNOSIS — Z6823 Body mass index (BMI) 23.0-23.9, adult: Secondary | ICD-10-CM | POA: Diagnosis not present

## 2022-04-15 DIAGNOSIS — G8929 Other chronic pain: Secondary | ICD-10-CM | POA: Diagnosis not present

## 2022-04-15 DIAGNOSIS — N1831 Chronic kidney disease, stage 3a: Secondary | ICD-10-CM | POA: Diagnosis not present

## 2022-04-15 DIAGNOSIS — E1122 Type 2 diabetes mellitus with diabetic chronic kidney disease: Secondary | ICD-10-CM | POA: Diagnosis not present

## 2022-04-15 DIAGNOSIS — M25561 Pain in right knee: Secondary | ICD-10-CM | POA: Diagnosis not present

## 2022-04-15 DIAGNOSIS — I129 Hypertensive chronic kidney disease with stage 1 through stage 4 chronic kidney disease, or unspecified chronic kidney disease: Secondary | ICD-10-CM | POA: Diagnosis not present

## 2022-04-15 DIAGNOSIS — M1990 Unspecified osteoarthritis, unspecified site: Secondary | ICD-10-CM | POA: Diagnosis not present

## 2022-04-15 DIAGNOSIS — E46 Unspecified protein-calorie malnutrition: Secondary | ICD-10-CM | POA: Diagnosis not present

## 2022-04-15 DIAGNOSIS — M4802 Spinal stenosis, cervical region: Secondary | ICD-10-CM | POA: Diagnosis not present

## 2022-04-15 DIAGNOSIS — D631 Anemia in chronic kidney disease: Secondary | ICD-10-CM | POA: Diagnosis not present

## 2022-04-15 DIAGNOSIS — Z993 Dependence on wheelchair: Secondary | ICD-10-CM | POA: Diagnosis not present

## 2022-04-25 DIAGNOSIS — L89323 Pressure ulcer of left buttock, stage 3: Secondary | ICD-10-CM | POA: Diagnosis not present

## 2022-04-25 DIAGNOSIS — L89313 Pressure ulcer of right buttock, stage 3: Secondary | ICD-10-CM | POA: Diagnosis not present

## 2022-04-27 DIAGNOSIS — M4802 Spinal stenosis, cervical region: Secondary | ICD-10-CM | POA: Diagnosis not present

## 2022-04-27 DIAGNOSIS — H9193 Unspecified hearing loss, bilateral: Secondary | ICD-10-CM | POA: Diagnosis not present

## 2022-04-27 DIAGNOSIS — E46 Unspecified protein-calorie malnutrition: Secondary | ICD-10-CM | POA: Diagnosis not present

## 2022-04-27 DIAGNOSIS — I129 Hypertensive chronic kidney disease with stage 1 through stage 4 chronic kidney disease, or unspecified chronic kidney disease: Secondary | ICD-10-CM | POA: Diagnosis not present

## 2022-04-27 DIAGNOSIS — Z87891 Personal history of nicotine dependence: Secondary | ICD-10-CM | POA: Diagnosis not present

## 2022-04-27 DIAGNOSIS — E785 Hyperlipidemia, unspecified: Secondary | ICD-10-CM | POA: Diagnosis not present

## 2022-04-27 DIAGNOSIS — Z7984 Long term (current) use of oral hypoglycemic drugs: Secondary | ICD-10-CM | POA: Diagnosis not present

## 2022-04-27 DIAGNOSIS — M25561 Pain in right knee: Secondary | ICD-10-CM | POA: Diagnosis not present

## 2022-04-27 DIAGNOSIS — D631 Anemia in chronic kidney disease: Secondary | ICD-10-CM | POA: Diagnosis not present

## 2022-04-27 DIAGNOSIS — Z6823 Body mass index (BMI) 23.0-23.9, adult: Secondary | ICD-10-CM | POA: Diagnosis not present

## 2022-04-27 DIAGNOSIS — G8929 Other chronic pain: Secondary | ICD-10-CM | POA: Diagnosis not present

## 2022-04-27 DIAGNOSIS — Z993 Dependence on wheelchair: Secondary | ICD-10-CM | POA: Diagnosis not present

## 2022-04-27 DIAGNOSIS — S72141D Displaced intertrochanteric fracture of right femur, subsequent encounter for closed fracture with routine healing: Secondary | ICD-10-CM | POA: Diagnosis not present

## 2022-04-27 DIAGNOSIS — Z7901 Long term (current) use of anticoagulants: Secondary | ICD-10-CM | POA: Diagnosis not present

## 2022-04-27 DIAGNOSIS — Z9181 History of falling: Secondary | ICD-10-CM | POA: Diagnosis not present

## 2022-04-27 DIAGNOSIS — N1831 Chronic kidney disease, stage 3a: Secondary | ICD-10-CM | POA: Diagnosis not present

## 2022-04-27 DIAGNOSIS — M1990 Unspecified osteoarthritis, unspecified site: Secondary | ICD-10-CM | POA: Diagnosis not present

## 2022-04-27 DIAGNOSIS — E1122 Type 2 diabetes mellitus with diabetic chronic kidney disease: Secondary | ICD-10-CM | POA: Diagnosis not present

## 2022-04-29 DIAGNOSIS — M4802 Spinal stenosis, cervical region: Secondary | ICD-10-CM | POA: Diagnosis not present

## 2022-04-29 DIAGNOSIS — H9193 Unspecified hearing loss, bilateral: Secondary | ICD-10-CM | POA: Diagnosis not present

## 2022-04-29 DIAGNOSIS — I129 Hypertensive chronic kidney disease with stage 1 through stage 4 chronic kidney disease, or unspecified chronic kidney disease: Secondary | ICD-10-CM | POA: Diagnosis not present

## 2022-04-29 DIAGNOSIS — D631 Anemia in chronic kidney disease: Secondary | ICD-10-CM | POA: Diagnosis not present

## 2022-04-29 DIAGNOSIS — M25561 Pain in right knee: Secondary | ICD-10-CM | POA: Diagnosis not present

## 2022-04-29 DIAGNOSIS — E785 Hyperlipidemia, unspecified: Secondary | ICD-10-CM | POA: Diagnosis not present

## 2022-04-29 DIAGNOSIS — Z7984 Long term (current) use of oral hypoglycemic drugs: Secondary | ICD-10-CM | POA: Diagnosis not present

## 2022-04-29 DIAGNOSIS — M1990 Unspecified osteoarthritis, unspecified site: Secondary | ICD-10-CM | POA: Diagnosis not present

## 2022-04-29 DIAGNOSIS — Z9181 History of falling: Secondary | ICD-10-CM | POA: Diagnosis not present

## 2022-04-29 DIAGNOSIS — E46 Unspecified protein-calorie malnutrition: Secondary | ICD-10-CM | POA: Diagnosis not present

## 2022-04-29 DIAGNOSIS — Z993 Dependence on wheelchair: Secondary | ICD-10-CM | POA: Diagnosis not present

## 2022-04-29 DIAGNOSIS — Z7901 Long term (current) use of anticoagulants: Secondary | ICD-10-CM | POA: Diagnosis not present

## 2022-04-29 DIAGNOSIS — Z6823 Body mass index (BMI) 23.0-23.9, adult: Secondary | ICD-10-CM | POA: Diagnosis not present

## 2022-04-29 DIAGNOSIS — N1831 Chronic kidney disease, stage 3a: Secondary | ICD-10-CM | POA: Diagnosis not present

## 2022-04-29 DIAGNOSIS — Z87891 Personal history of nicotine dependence: Secondary | ICD-10-CM | POA: Diagnosis not present

## 2022-04-29 DIAGNOSIS — S72141D Displaced intertrochanteric fracture of right femur, subsequent encounter for closed fracture with routine healing: Secondary | ICD-10-CM | POA: Diagnosis not present

## 2022-04-29 DIAGNOSIS — G8929 Other chronic pain: Secondary | ICD-10-CM | POA: Diagnosis not present

## 2022-04-29 DIAGNOSIS — E1122 Type 2 diabetes mellitus with diabetic chronic kidney disease: Secondary | ICD-10-CM | POA: Diagnosis not present

## 2022-06-12 ENCOUNTER — Encounter (HOSPITAL_COMMUNITY): Payer: Self-pay

## 2022-06-12 ENCOUNTER — Emergency Department (HOSPITAL_COMMUNITY): Payer: PPO

## 2022-06-12 ENCOUNTER — Inpatient Hospital Stay (HOSPITAL_COMMUNITY)
Admission: EM | Admit: 2022-06-12 | Discharge: 2022-06-14 | DRG: 177 | Disposition: A | Discharge status: Hospice | Payer: PPO | Attending: Internal Medicine | Admitting: Internal Medicine

## 2022-06-12 ENCOUNTER — Other Ambulatory Visit: Payer: Self-pay

## 2022-06-12 DIAGNOSIS — Z79899 Other long term (current) drug therapy: Secondary | ICD-10-CM

## 2022-06-12 DIAGNOSIS — F0390 Unspecified dementia without behavioral disturbance: Secondary | ICD-10-CM | POA: Diagnosis present

## 2022-06-12 DIAGNOSIS — I10 Essential (primary) hypertension: Secondary | ICD-10-CM | POA: Diagnosis present

## 2022-06-12 DIAGNOSIS — R7881 Bacteremia: Secondary | ICD-10-CM | POA: Diagnosis present

## 2022-06-12 DIAGNOSIS — Z9181 History of falling: Secondary | ICD-10-CM

## 2022-06-12 DIAGNOSIS — Z7401 Bed confinement status: Secondary | ICD-10-CM

## 2022-06-12 DIAGNOSIS — Z87891 Personal history of nicotine dependence: Secondary | ICD-10-CM | POA: Diagnosis not present

## 2022-06-12 DIAGNOSIS — Z6824 Body mass index (BMI) 24.0-24.9, adult: Secondary | ICD-10-CM | POA: Diagnosis not present

## 2022-06-12 DIAGNOSIS — E1165 Type 2 diabetes mellitus with hyperglycemia: Secondary | ICD-10-CM | POA: Diagnosis present

## 2022-06-12 DIAGNOSIS — Z993 Dependence on wheelchair: Secondary | ICD-10-CM

## 2022-06-12 DIAGNOSIS — L8940 Pressure ulcer of contiguous site of back, buttock and hip, unspecified stage: Secondary | ICD-10-CM

## 2022-06-12 DIAGNOSIS — Z888 Allergy status to other drugs, medicaments and biological substances status: Secondary | ICD-10-CM

## 2022-06-12 DIAGNOSIS — R627 Adult failure to thrive: Secondary | ICD-10-CM | POA: Diagnosis present

## 2022-06-12 DIAGNOSIS — J189 Pneumonia, unspecified organism: Secondary | ICD-10-CM | POA: Diagnosis not present

## 2022-06-12 DIAGNOSIS — K5900 Constipation, unspecified: Secondary | ICD-10-CM | POA: Diagnosis present

## 2022-06-12 DIAGNOSIS — B964 Proteus (mirabilis) (morganii) as the cause of diseases classified elsewhere: Secondary | ICD-10-CM | POA: Diagnosis present

## 2022-06-12 DIAGNOSIS — N1832 Chronic kidney disease, stage 3b: Secondary | ICD-10-CM | POA: Diagnosis present

## 2022-06-12 DIAGNOSIS — L89310 Pressure ulcer of right buttock, unstageable: Secondary | ICD-10-CM | POA: Diagnosis present

## 2022-06-12 DIAGNOSIS — J188 Other pneumonia, unspecified organism: Secondary | ICD-10-CM | POA: Diagnosis present

## 2022-06-12 DIAGNOSIS — Z515 Encounter for palliative care: Secondary | ICD-10-CM

## 2022-06-12 DIAGNOSIS — E43 Unspecified severe protein-calorie malnutrition: Secondary | ICD-10-CM | POA: Diagnosis present

## 2022-06-12 DIAGNOSIS — Z789 Other specified health status: Secondary | ICD-10-CM | POA: Insufficient documentation

## 2022-06-12 DIAGNOSIS — E872 Acidosis, unspecified: Secondary | ICD-10-CM | POA: Diagnosis present

## 2022-06-12 DIAGNOSIS — M170 Bilateral primary osteoarthritis of knee: Secondary | ICD-10-CM | POA: Diagnosis present

## 2022-06-12 DIAGNOSIS — R4182 Altered mental status, unspecified: Secondary | ICD-10-CM | POA: Diagnosis present

## 2022-06-12 DIAGNOSIS — A499 Bacterial infection, unspecified: Secondary | ICD-10-CM | POA: Diagnosis not present

## 2022-06-12 DIAGNOSIS — R809 Proteinuria, unspecified: Secondary | ICD-10-CM | POA: Diagnosis present

## 2022-06-12 DIAGNOSIS — E1122 Type 2 diabetes mellitus with diabetic chronic kidney disease: Secondary | ICD-10-CM | POA: Diagnosis present

## 2022-06-12 DIAGNOSIS — L89153 Pressure ulcer of sacral region, stage 3: Secondary | ICD-10-CM | POA: Diagnosis present

## 2022-06-12 DIAGNOSIS — Z7984 Long term (current) use of oral hypoglycemic drugs: Secondary | ICD-10-CM

## 2022-06-12 DIAGNOSIS — G9341 Metabolic encephalopathy: Secondary | ICD-10-CM | POA: Diagnosis present

## 2022-06-12 DIAGNOSIS — D631 Anemia in chronic kidney disease: Secondary | ICD-10-CM | POA: Diagnosis present

## 2022-06-12 DIAGNOSIS — I129 Hypertensive chronic kidney disease with stage 1 through stage 4 chronic kidney disease, or unspecified chronic kidney disease: Secondary | ICD-10-CM | POA: Diagnosis present

## 2022-06-12 DIAGNOSIS — J690 Pneumonitis due to inhalation of food and vomit: Secondary | ICD-10-CM | POA: Diagnosis present

## 2022-06-12 DIAGNOSIS — Z743 Need for continuous supervision: Secondary | ICD-10-CM | POA: Diagnosis not present

## 2022-06-12 DIAGNOSIS — K219 Gastro-esophageal reflux disease without esophagitis: Secondary | ICD-10-CM | POA: Diagnosis present

## 2022-06-12 DIAGNOSIS — L89323 Pressure ulcer of left buttock, stage 3: Secondary | ICD-10-CM | POA: Diagnosis present

## 2022-06-12 DIAGNOSIS — L89626 Pressure-induced deep tissue damage of left heel: Secondary | ICD-10-CM | POA: Diagnosis present

## 2022-06-12 DIAGNOSIS — R739 Hyperglycemia, unspecified: Secondary | ICD-10-CM | POA: Diagnosis not present

## 2022-06-12 DIAGNOSIS — E782 Mixed hyperlipidemia: Secondary | ICD-10-CM | POA: Diagnosis present

## 2022-06-12 DIAGNOSIS — Z7189 Other specified counseling: Secondary | ICD-10-CM | POA: Diagnosis not present

## 2022-06-12 DIAGNOSIS — H9190 Unspecified hearing loss, unspecified ear: Secondary | ICD-10-CM | POA: Diagnosis present

## 2022-06-12 DIAGNOSIS — B952 Enterococcus as the cause of diseases classified elsewhere: Secondary | ICD-10-CM | POA: Diagnosis present

## 2022-06-12 DIAGNOSIS — D649 Anemia, unspecified: Secondary | ICD-10-CM | POA: Diagnosis present

## 2022-06-12 DIAGNOSIS — Z66 Do not resuscitate: Secondary | ICD-10-CM | POA: Diagnosis present

## 2022-06-12 DIAGNOSIS — R609 Edema, unspecified: Secondary | ICD-10-CM | POA: Diagnosis not present

## 2022-06-12 DIAGNOSIS — K5730 Diverticulosis of large intestine without perforation or abscess without bleeding: Secondary | ICD-10-CM | POA: Insufficient documentation

## 2022-06-12 DIAGNOSIS — Z7901 Long term (current) use of anticoagulants: Secondary | ICD-10-CM

## 2022-06-12 DIAGNOSIS — R81 Glycosuria: Secondary | ICD-10-CM | POA: Diagnosis present

## 2022-06-12 DIAGNOSIS — D509 Iron deficiency anemia, unspecified: Secondary | ICD-10-CM | POA: Diagnosis present

## 2022-06-12 DIAGNOSIS — R404 Transient alteration of awareness: Secondary | ICD-10-CM | POA: Diagnosis not present

## 2022-06-12 DIAGNOSIS — I959 Hypotension, unspecified: Secondary | ICD-10-CM | POA: Diagnosis not present

## 2022-06-12 DIAGNOSIS — Z981 Arthrodesis status: Secondary | ICD-10-CM

## 2022-06-12 LAB — BLOOD CULTURE ID PANEL (REFLEXED) - BCID2
A.calcoaceticus-baumannii: NOT DETECTED
Bacteroides fragilis: NOT DETECTED
CTX-M ESBL: NOT DETECTED
Candida albicans: NOT DETECTED
Candida auris: NOT DETECTED
Candida glabrata: NOT DETECTED
Candida krusei: NOT DETECTED
Candida parapsilosis: NOT DETECTED
Candida tropicalis: NOT DETECTED
Carbapenem resist OXA 48 LIKE: NOT DETECTED
Carbapenem resistance IMP: NOT DETECTED
Carbapenem resistance KPC: NOT DETECTED
Carbapenem resistance NDM: NOT DETECTED
Carbapenem resistance VIM: NOT DETECTED
Cryptococcus neoformans/gattii: NOT DETECTED
Enterobacter cloacae complex: NOT DETECTED
Enterobacterales: DETECTED — AB
Enterococcus Faecium: NOT DETECTED
Enterococcus faecalis: DETECTED — AB
Escherichia coli: NOT DETECTED
Haemophilus influenzae: NOT DETECTED
Klebsiella aerogenes: NOT DETECTED
Klebsiella oxytoca: NOT DETECTED
Klebsiella pneumoniae: NOT DETECTED
Listeria monocytogenes: NOT DETECTED
Meth resistant mecA/C and MREJ: NOT DETECTED
Methicillin resistance mecA/C: NOT DETECTED
Neisseria meningitidis: NOT DETECTED
Proteus species: DETECTED — AB
Pseudomonas aeruginosa: NOT DETECTED
Salmonella species: NOT DETECTED
Serratia marcescens: NOT DETECTED
Staphylococcus aureus (BCID): DETECTED — AB
Staphylococcus epidermidis: DETECTED — AB
Staphylococcus lugdunensis: NOT DETECTED
Staphylococcus species: DETECTED — AB
Stenotrophomonas maltophilia: NOT DETECTED
Streptococcus agalactiae: NOT DETECTED
Streptococcus pneumoniae: NOT DETECTED
Streptococcus pyogenes: NOT DETECTED
Streptococcus species: DETECTED — AB
Vancomycin resistance: NOT DETECTED

## 2022-06-12 LAB — CBC WITH DIFFERENTIAL/PLATELET
Abs Immature Granulocytes: 0.19 10*3/uL — ABNORMAL HIGH (ref 0.00–0.07)
Basophils Absolute: 0 10*3/uL (ref 0.0–0.1)
Basophils Relative: 0 %
Eosinophils Absolute: 0 10*3/uL (ref 0.0–0.5)
Eosinophils Relative: 0 %
HCT: 28 % — ABNORMAL LOW (ref 39.0–52.0)
Hemoglobin: 8.2 g/dL — ABNORMAL LOW (ref 13.0–17.0)
Immature Granulocytes: 1 %
Lymphocytes Relative: 2 %
Lymphs Abs: 0.3 10*3/uL — ABNORMAL LOW (ref 0.7–4.0)
MCH: 26.1 pg (ref 26.0–34.0)
MCHC: 29.3 g/dL — ABNORMAL LOW (ref 30.0–36.0)
MCV: 89.2 fL (ref 80.0–100.0)
Monocytes Absolute: 0.6 10*3/uL (ref 0.1–1.0)
Monocytes Relative: 3 %
Neutro Abs: 18.6 10*3/uL — ABNORMAL HIGH (ref 1.7–7.7)
Neutrophils Relative %: 94 %
Platelets: 518 10*3/uL — ABNORMAL HIGH (ref 150–400)
RBC: 3.14 MIL/uL — ABNORMAL LOW (ref 4.22–5.81)
RDW: 17 % — ABNORMAL HIGH (ref 11.5–15.5)
WBC: 19.8 10*3/uL — ABNORMAL HIGH (ref 4.0–10.5)
nRBC: 0 % (ref 0.0–0.2)

## 2022-06-12 LAB — COMPREHENSIVE METABOLIC PANEL
ALT: 12 U/L (ref 0–44)
AST: 13 U/L — ABNORMAL LOW (ref 15–41)
Albumin: 1.8 g/dL — ABNORMAL LOW (ref 3.5–5.0)
Alkaline Phosphatase: 79 U/L (ref 38–126)
Anion gap: 9 (ref 5–15)
BUN: 113 mg/dL — ABNORMAL HIGH (ref 8–23)
CO2: 11 mmol/L — ABNORMAL LOW (ref 22–32)
Calcium: 8.1 mg/dL — ABNORMAL LOW (ref 8.9–10.3)
Chloride: 113 mmol/L — ABNORMAL HIGH (ref 98–111)
Creatinine, Ser: 1.94 mg/dL — ABNORMAL HIGH (ref 0.61–1.24)
GFR, Estimated: 34 mL/min — ABNORMAL LOW (ref >60)
Glucose, Bld: 422 mg/dL — ABNORMAL HIGH (ref 70–99)
Potassium: 4.8 mmol/L (ref 3.5–5.1)
Sodium: 133 mmol/L — ABNORMAL LOW (ref 135–145)
Total Bilirubin: 0.4 mg/dL (ref 0.3–1.2)
Total Protein: 6.2 g/dL — ABNORMAL LOW (ref 6.5–8.1)

## 2022-06-12 LAB — URINALYSIS, W/ REFLEX TO CULTURE (INFECTION SUSPECTED)
Bacteria, UA: NONE SEEN
Bilirubin Urine: NEGATIVE
Glucose, UA: >=500 mg/dL — AB
Hgb urine dipstick: NEGATIVE
Ketones, ur: NEGATIVE mg/dL
Leukocytes,Ua: NEGATIVE
Nitrite: NEGATIVE
Protein, ur: 100 mg/dL — AB
Specific Gravity, Urine: 1.017 (ref 1.005–1.030)
pH: 5 (ref 5.0–8.0)

## 2022-06-12 LAB — GLUCOSE, CAPILLARY
Glucose-Capillary: 347 mg/dL — ABNORMAL HIGH (ref 70–99)
Glucose-Capillary: 224 mg/dL — ABNORMAL HIGH (ref 70–99)
Glucose-Capillary: 123 mg/dL — ABNORMAL HIGH (ref 70–99)

## 2022-06-12 LAB — LACTIC ACID, PLASMA: Lactic Acid, Venous: 1.2 mmol/L (ref 0.5–1.9)

## 2022-06-12 LAB — CBG MONITORING, ED: Glucose-Capillary: 376 mg/dL — ABNORMAL HIGH (ref 70–99)

## 2022-06-12 LAB — BETA-HYDROXYBUTYRIC ACID: Beta-Hydroxybutyric Acid: 0.86 mmol/L — ABNORMAL HIGH (ref 0.05–0.27)

## 2022-06-12 MED ORDER — INSULIN ASPART 100 UNIT/ML IJ SOLN
7.0000 [IU] | Freq: Once | INTRAMUSCULAR | Status: AC
  Administered 2022-06-12: 7 [IU] via SUBCUTANEOUS
  Filled 2022-06-12: qty 0.07

## 2022-06-12 MED ORDER — METRONIDAZOLE 500 MG/100ML IV SOLN
500.0000 mg | Freq: Two times a day (BID) | INTRAVENOUS | Status: DC
  Administered 2022-06-12 – 2022-06-13 (×2): 500 mg via INTRAVENOUS
  Filled 2022-06-12 (×2): qty 100

## 2022-06-12 MED ORDER — PIPERACILLIN-TAZOBACTAM 3.375 G IVPB
3.3750 g | Freq: Three times a day (TID) | INTRAVENOUS | Status: DC
  Administered 2022-06-13 (×2): 3.375 g via INTRAVENOUS
  Filled 2022-06-12 (×2): qty 50

## 2022-06-12 MED ORDER — ASPIRIN 81 MG PO TBEC
81.0000 mg | DELAYED_RELEASE_TABLET | Freq: Every day | ORAL | Status: DC
  Filled 2022-06-12: qty 1

## 2022-06-12 MED ORDER — ORAL CARE MOUTH RINSE
15.0000 mL | OROMUCOSAL | Status: DC
  Administered 2022-06-13 – 2022-06-14 (×4): 15 mL via OROMUCOSAL

## 2022-06-12 MED ORDER — ENSURE MAX PROTEIN PO LIQD
11.0000 [oz_av] | Freq: Two times a day (BID) | ORAL | Status: DC
  Administered 2022-06-13: 11 [oz_av] via ORAL
  Filled 2022-06-12: qty 330

## 2022-06-12 MED ORDER — FERROUS SULFATE 325 (65 FE) MG PO TABS
325.0000 mg | ORAL_TABLET | Freq: Every morning | ORAL | Status: DC
  Filled 2022-06-12: qty 1

## 2022-06-12 MED ORDER — ACETAMINOPHEN 650 MG RE SUPP: 650.0000 mg | Freq: Four times a day (QID) | RECTAL | Status: DC | PRN

## 2022-06-12 MED ORDER — BISACODYL 10 MG RE SUPP: 10.0000 mg | Freq: Every day | RECTAL | Status: DC | PRN

## 2022-06-12 MED ORDER — ORAL CARE MOUTH RINSE
15.0000 mL | OROMUCOSAL | Status: DC
  Administered 2022-06-13: 15 mL via OROMUCOSAL

## 2022-06-12 MED ORDER — ORAL CARE MOUTH RINSE: 15.0000 mL | OROMUCOSAL | Status: DC | PRN

## 2022-06-12 MED ORDER — INSULIN ASPART 100 UNIT/ML IJ SOLN
0.0000 [IU] | Freq: Three times a day (TID) | INTRAMUSCULAR | Status: DC
  Administered 2022-06-12: 3 [IU] via SUBCUTANEOUS
  Administered 2022-06-12: 7 [IU] via SUBCUTANEOUS

## 2022-06-12 MED ORDER — SODIUM CHLORIDE (PF) 0.9 % IJ SOLN
INTRAMUSCULAR | Status: AC
  Filled 2022-06-12: qty 50

## 2022-06-12 MED ORDER — ONDANSETRON HCL 4 MG/2ML IJ SOLN: 4.0000 mg | Freq: Four times a day (QID) | INTRAMUSCULAR | Status: DC | PRN

## 2022-06-12 MED ORDER — GLIMEPIRIDE 2 MG PO TABS
2.0000 mg | ORAL_TABLET | Freq: Two times a day (BID) | ORAL | Status: DC
  Filled 2022-06-12: qty 1

## 2022-06-12 MED ORDER — POLYETHYLENE GLYCOL 3350 17 G PO PACK: 17.0000 g | PACK | Freq: Every day | ORAL | Status: DC | PRN

## 2022-06-12 MED ORDER — SODIUM CHLORIDE 0.9 % IV SOLN
2.0000 g | Freq: Once | INTRAVENOUS | Status: AC
  Administered 2022-06-12: 2 g via INTRAVENOUS
  Filled 2022-06-12 (×2): qty 12.5

## 2022-06-12 MED ORDER — FAMOTIDINE 20 MG PO TABS: 20.0000 mg | ORAL_TABLET | Freq: Every day | ORAL | Status: DC

## 2022-06-12 MED ORDER — SODIUM CHLORIDE 0.9 % IV SOLN: 2.0000 g | INTRAVENOUS | Status: DC

## 2022-06-12 MED ORDER — SODIUM CHLORIDE 0.45 % IV SOLN: INTRAVENOUS | Status: DC

## 2022-06-12 MED ORDER — VANCOMYCIN HCL 750 MG/150ML IV SOLN
750.0000 mg | INTRAVENOUS | Status: DC
  Administered 2022-06-13: 750 mg via INTRAVENOUS
  Filled 2022-06-12: qty 150

## 2022-06-12 MED ORDER — IOHEXOL 350 MG/ML SOLN
75.0000 mL | Freq: Once | INTRAVENOUS | Status: AC | PRN
  Administered 2022-06-12: 75 mL via INTRAVENOUS

## 2022-06-12 MED ORDER — SIMVASTATIN 20 MG PO TABS: 20.0000 mg | ORAL_TABLET | Freq: Every evening | ORAL | Status: DC

## 2022-06-12 MED ORDER — ONDANSETRON HCL 4 MG PO TABS: 4.0000 mg | ORAL_TABLET | Freq: Four times a day (QID) | ORAL | Status: DC | PRN

## 2022-06-12 MED ORDER — APIXABAN 5 MG PO TABS
5.0000 mg | ORAL_TABLET | Freq: Two times a day (BID) | ORAL | Status: DC
  Administered 2022-06-13: 5 mg via ORAL
  Filled 2022-06-12: qty 1

## 2022-06-12 MED ORDER — DOCUSATE SODIUM 100 MG PO CAPS: 100.0000 mg | ORAL_CAPSULE | Freq: Two times a day (BID) | ORAL | Status: DC | PRN

## 2022-06-12 MED ORDER — VANCOMYCIN HCL IN DEXTROSE 1-5 GM/200ML-% IV SOLN
1000.0000 mg | Freq: Once | INTRAVENOUS | Status: AC
  Administered 2022-06-12: 1000 mg via INTRAVENOUS
  Filled 2022-06-12: qty 200

## 2022-06-12 MED ORDER — INSULIN ASPART 100 UNIT/ML IJ SOLN: 0.0000 [IU] | Freq: Three times a day (TID) | INTRAMUSCULAR | Status: DC

## 2022-06-12 MED ORDER — LACTATED RINGERS IV BOLUS
1000.0000 mL | Freq: Once | INTRAVENOUS | Status: AC
  Administered 2022-06-12: 1000 mL via INTRAVENOUS

## 2022-06-12 MED ORDER — ACETAMINOPHEN 325 MG PO TABS
650.0000 mg | ORAL_TABLET | Freq: Four times a day (QID) | ORAL | Status: DC | PRN
  Administered 2022-06-13: 650 mg via ORAL
  Filled 2022-06-12: qty 2

## 2022-06-12 NOTE — ED Provider Notes (Signed)
Patient seen after prior ED provider.  Hospital service is aware of case and will evaluate for admission.  Patient's mentation appears to be improved on reevaluation.  He is conversant and complains of pain to his buttocks.  Broad-spectrum antibiotics administered earlier in ED evaluation.  CT imaging is suggestive of possible pneumonia.  Additionally, patient with significant skin breakdown on his buttocks concerning for possible concurrent infection.   [name], MD 06/12/22 [phone]

## 2022-06-12 NOTE — ED Triage Notes (Signed)
LSW sometime last night. EMS not sure

## 2022-06-12 NOTE — H&P (Signed)
History and Physical    Patient: Jason Mcpherson [id number] DOB: October 30, 1940 DOA: 06/12/2022 DOS: the patient was seen and examined on 06/12/2022 PCP: Patrecia Pour, Christean Grief, MD  Patient coming from: Home  Chief Complaint:  Chief Complaint  Patient presents with   Altered Mental Status   HPI: Jason Mcpherson is a 82 y.o. male with medical history significant of chronic anemia, osteoarthritis of the knees and back, C-spine spinal stenosis, right lower extremity cellulitis, type 2 diabetes, hyperkalemia, hypertension, hyperlipidemia, history of falls, history of right hip fracture, severe malnutrition who is brought to the emergency department from home due to altered mental status.  History is provided by his wife and son.  He is able to answer some simple questions.  He denied headache, abdominal, back or chest pain at the time of my examination.  Lab work: Urinalysis showed glucosuria more than 500 and proteinuria 100 mg/dL.  Lactic acid was normal.  CBC with a white count of 19.8 with 94% neutrophils, hemoglobin 8.2 g/dL platelets 518.  At the hydroxybutyric acid 0.86 mmol/L.  CMP shows an uncorrected sodium of 133, potassium 4.8, chloride 113 and CO2 11 mmol/L with a normal anion gap.  Glucose 422, BUN 113, creatinine 1.94 and calcium 8.1 mg/dL.  Total protein 6.2 and albumin 1.8 g/dL.  AST 13.  Normal ALT, alkaline phosphatase and total bilirubin.  Imaging: Left hand x-ray with severe subluxation and dislocation of the thumb at the first MCP joint and advanced osteoarthritis in the hand as above.  Portable 1 view chest radiograph with a new wedge-shaped area of consolidation in the periphery of the right lung base either in the right middle or lower lobes.  Aortic atherosclerosis.  CTA chest with no evidence of PE, but showing pulmonary consolidation in the right middle lobe and airspace disease in the right upper lobe highly suspicious for pneumonia.  Aortic atherosclerosis and emphysema.  CT  head without contrast no acute intracranial normalities.  There are moderate cerebral and mild cerebellar atrophy with chronic microvascular ischemic changes in the cerebral white matter.  Please see images and full radiology report for further details.  ED course: Initial vital signs were temperature97.4 F, pulse 93, respirations 22, BP 151/81 mmHg O2 sat 97% on room air.  The patient received cefepime, vancomycin, 1000 mL of LR bolus and NovoLog 7 units SQ x 1 dose.   Review of Systems: As mentioned in the history of present illness. All other systems reviewed and are negative.  Past Medical History:  Diagnosis Date   Anemia    Arthritis    KNEES & BACK   Cellulitis 08/2013   RT LEG   Chronic kidney disease 08/2013   ACUTE RENAL    Dehydration    Diabetes mellitus without complication ([place])    Hyperkalemia 08/2013   Hyperlipemia    Hypertension    Past Surgical History:  Procedure Laterality Date   ANTERIOR CERVICAL DECOMP/DISCECTOMY FUSION N/A 10/17/2012   Procedure: ANTERIOR CERVICAL DECOMPRESSION/DISCECTOMY FUSION 2 LEVELS;  Surgeon: Charlie Pitter, MD;  Location: [place] [facility];  Service: Neurosurgery;  Laterality: N/A;  Cervical three-four, four-five anterior cervical decompression fusion with allograft plating   BACK SURGERY     10 years ago   [address] IM NAIL Right 02/01/2022   Procedure: INTRAMEDULLARY (IM) NAIL FEMORAL;  Surgeon: Paralee Cancel, MD;  Location: [facility] ORS;  Service: Orthopedics;  Laterality: Right;   Social History:  reports that he  quit smoking about 24 years ago. He has never used smokeless tobacco. He reports that he does not drink alcohol and does not use drugs.  Allergies  Allergen Reactions   Amlodipine Swelling    ANKLE EDEMA    Family History  Problem Relation Age of Onset   Cancer - Colon Mother    Cancer - Prostate Father     Prior to Admission medications   Medication Sig Start Date End Date Taking? Authorizing Provider   acetaminophen (TYLENOL) 500 MG tablet Take 1,000 mg by mouth every 6 (six) hours as needed for mild pain.    [provider]  amLODipine (NORVASC) 5 MG tablet TAKE 1 TABLET (5 MG TOTAL) BY MOUTH DAILY. 07/05/20   Elayne Snare, MD  apixaban (ELIQUIS) 5 MG TABS tablet Take 2 tablets (10 mg total) by mouth 2 (two) times daily for 4 days. 02/07/22 02/11/22  Hosie Poisson, MD  apixaban (ELIQUIS) 5 MG TABS tablet Take 1 tablet (5 mg total) by mouth 2 (two) times daily. 02/12/22   Hosie Poisson, MD  bisacodyl (DULCOLAX) 10 MG suppository Place 1 suppository (10 mg total) rectally daily as needed for moderate constipation. 02/07/22   Hosie Poisson, MD  docusate sodium (COLACE) 100 MG capsule Take 1 capsule (100 mg total) by mouth 2 (two) times daily. 02/07/22   Hosie Poisson, MD  Ensure Max Protein (ENSURE MAX PROTEIN) LIQD Take 330 mLs (11 oz total) by mouth 2 (two) times daily. 02/07/22 02/07/23  Hosie Poisson, MD  FARXIGA 5 MG TABS tablet Take 5 mg by mouth daily. 01/15/22   [provider]  ferrous sulfate 325 (65 FE) MG tablet Take 325 mg by mouth every morning. 12/28/21   [provider]  glimepiride (AMARYL) 1 MG tablet Take 1.5 mg by mouth every morning. 12/11/21   [provider]  glucose blood (ONETOUCH ULTRA) test strip USE AS DIRECTED TO TEST TWICE DAILY 05/13/20   Elayne Snare, MD  HYDROcodone-acetaminophen (NORCO/VICODIN) 5-325 MG tablet Take 1 tablet by mouth every 4 (four) hours as needed for moderate pain (pain score 4-6). 02/03/22   Paralee Cancel, MD  Lancets [facility] DELICA PLUS [id number]) [place] 2X DAILY. 06/06/20   Elayne Snare, MD  Multiple Vitamin (MULTIVITAMIN WITH MINERALS) TABS tablet Take 1 tablet by mouth daily. 02/08/22   Hosie Poisson, MD  [facility] DELICA LANCETS FINE MISC USED TO CHECK SUGAR 2X DAILY. 12/28/16   Elayne Snare, MD  polyethylene glycol (MIRALAX / GLYCOLAX) 17 g packet Take 17 g by mouth daily as needed for mild constipation.  02/07/22   Hosie Poisson, MD  simvastatin (ZOCOR) 20 MG tablet TAKE 1 TABLET BY MOUTH EVERY DAY IN THE EVENING Patient taking differently: Take 20 mg by mouth every evening. 03/30/20   Elayne Snare, MD  torsemide (DEMADEX) 10 MG tablet Take 10 mg by mouth daily as needed for edema. 10/19/21   [provider]  Vitamin D, Ergocalciferol, (DRISDOL) 1.25 MG (50000 UNIT) CAPS capsule Take 50,000 Units by mouth once a week. Saturday 01/05/22   [provider]    Physical Exam: Vitals:   06/12/22 0500 06/12/22 0515 06/12/22 0830 06/12/22 0919  BP: (!) 151/81 139/73 136/82   Pulse: 93 92 89   Resp: (!) 22 (!) 21 14   Temp:    (!) 97.4 F (36.3 C)  TempSrc:    Rectal  SpO2: 100% 100% 100%   Weight:      Height:  Physical Exam Vitals and nursing note reviewed.  Constitutional:      Appearance: Normal appearance.  HENT:     Head: Normocephalic.     Nose: No rhinorrhea.     Mouth/Throat:     Mouth: Mucous membranes are moist.  Eyes:     General: No scleral icterus.    Pupils: Pupils are equal, round, and reactive to light.  Cardiovascular:     Rate and Rhythm: Normal rate.     Pulses: Normal pulses.  Pulmonary:     Effort: Pulmonary effort is normal. No tachypnea or accessory muscle usage.     Breath sounds: Examination of the right-middle field reveals decreased breath sounds and rales. Examination of the right-lower field reveals decreased breath sounds and rales. Decreased breath sounds, rhonchi and rales present. No wheezing.  Abdominal:     General: Bowel sounds are normal.     Palpations: Abdomen is soft.  Musculoskeletal:     Cervical back: Neck supple.     Right lower leg: No edema.     Left lower leg: No edema.  Skin:    General: Skin is warm and dry.     Findings: Lesion present.     Comments: Multiple stage III ulcers in sacral area.  No discharge seen.  Neurological:     General: No focal deficit present.     Mental Status: He is alert and oriented  to person, place, and time.  Psychiatric:        Mood and Affect: Mood normal.        Behavior: Behavior normal.     Data Reviewed:  Results are pending, will review when available.  Assessment and Plan: Principal Problem:   Multifocal pneumonia Questionable aspiration. Admit to PCU/inpatient. Continue time-limited IV fluids. Continue cefepime 2 g every 8 hours.   Continue metronidazole 500 mg IVPB q 12 hr. Continue vancomycin per pharmacy. Follow-up blood culture and sensitivity Follow CBC and CMP in a.m.  Active Problems:   Pressure injury of left buttock, stage 3 (HCC) Ulcers have surrounding erythema, but no significant discharge. Continue antibiotic therapy with above agents. Consult wound and ostomy care.    Uncontrolled type 2 diabetes mellitus with hyperglycemia (HCC) Carbohydrate modified diet. Time-limited IV hydration with 0.45% NaCl. Continue glimepiride 2 mg p.o. twice daily. CBG monitoring before meals and bedtime.    Essential hypertension, benign Blood pressures are soft. Hold antihypertensives.    Mixed hyperlipidemia Continue simvastatin 20 mg p.o. in the evening.    Normocytic anemia/iron deficiency anemia Monitor hematocrit and hemoglobin. Transfuse as needed. Continue iron supplementation.    Gastroesophageal reflux disease Famotidine 20 mg p.o. nightly while in the hospital.    Constipation Continue stool softeners and MiraLAX as needed. Dulcolax suppository as needed.    Chronic kidney disease, stage 3b ([place]) Monitor renal function/electrolytes. Holding diuretics at the moment.     Advance Care Planning:   Code Status: DNR   Consults:   Family Communication: His wife and son were present in the room.  Severity of Illness: The appropriate patient status for this patient is INPATIENT. Inpatient status is judged to be reasonable and necessary in order to provide the required intensity of service to ensure the patient's safety. The  patient's presenting symptoms, physical exam findings, and initial radiographic and laboratory data in the context of their chronic comorbidities is felt to place them at high risk for further clinical deterioration. Furthermore, it is not anticipated that the patient will be medically  stable for discharge from the hospital within 2 midnights of admission.   * I certify that at the point of admission it is my clinical judgment that the patient will require inpatient hospital care spanning beyond 2 midnights from the point of admission due to high intensity of service, high risk for further deterioration and high frequency of surveillance required.*  Author: Reubin Milan, MD 06/12/2022 9:45 AM  For on call review [email or web address].   This document was prepared using Dragon voice recognition software and may contain some unintended transcription errors.

## 2022-06-12 NOTE — ED Notes (Signed)
ED TO INPATIENT HANDOFF REPORT  ED Nurse Name and Phone #:  Hebert Soho, Bradley  S Name/Age/Gender Jason Mcpherson 82 y.o. male Room/Bed: WA16/WA16  Code Status   Code Status: Prior  Home/SNF/Other Home Patient oriented to: self and place Is this baseline? Yes   Triage Complete: Triage complete  Chief Complaint Multifocal pneumonia [J18.9]  Triage Note Pt from home and family woke him up tonight to change pt. Pt was not answering any questions. Pt is usually alert and oriented x3 but tonight is altered. Pt is seen at home by nurses. Pt has multiple wounds on back. Pt has left hand swelling. Family noticed decline over last two weeks. History CKD HLD, and falls. Pt is on eliquis  LSW sometime last night. EMS not sure    Allergies Allergies  Allergen Reactions   Amlodipine Swelling    ANKLE EDEMA    Level of Care/Admitting Diagnosis ED Disposition     ED Disposition  Admit   Condition  --   Comment  Hospital Area: [facility] [id number]  Level of Care: Progressive [102]  Admit to Progressive based on following criteria: MULTISYSTEM THREATS such as stable sepsis, metabolic/electrolyte imbalance with or without encephalopathy that is responding to early treatment.  Admit to Progressive based on following criteria: RESPIRATORY PROBLEMS hypoxemic/hypercapnic respiratory failure that is responsive to NIPPV (BiPAP) or High Flow Nasal Cannula (6-80 lpm). Frequent assessment/intervention, no > Q2 hrs < Q4 hrs, to maintain oxygenation and pulmonary hygiene.  May admit patient to [facility] or [facility] if equivalent level of care is available:: No  Covid Evaluation: Asymptomatic - no recent exposure (last 10 days) testing not required  Diagnosis: Multifocal pneumonia [id number]  Admitting Physician: Reubin Milan [id number]  Attending Physician: Reubin Milan [id number]  Certification:: I certify this patient will need inpatient services for at  least 2 midnights  Estimated Length of Stay: 2          B Medical/Surgery History Past Medical History:  Diagnosis Date   Anemia    Arthritis    KNEES & BACK   Cellulitis 08/2013   RT LEG   Chronic kidney disease 08/2013   ACUTE RENAL    Dehydration    Diabetes mellitus without complication ([place])    Hyperkalemia 08/2013   Hyperlipemia    Hypertension    Past Surgical History:  Procedure Laterality Date   ANTERIOR CERVICAL DECOMP/DISCECTOMY FUSION N/A 10/17/2012   Procedure: ANTERIOR CERVICAL DECOMPRESSION/DISCECTOMY FUSION 2 LEVELS;  Surgeon: Charlie Pitter, MD;  Location: [facility];  Service: Neurosurgery;  Laterality: N/A;  Cervical three-four, four-five anterior cervical decompression fusion with allograft plating   BACK SURGERY     10 years ago   Advance IM NAIL Right 02/01/2022   Procedure: INTRAMEDULLARY (IM) NAIL FEMORAL;  Surgeon: Paralee Cancel, MD;  Location: [facility];  Service: Orthopedics;  Laterality: Right;     A IV Location/Drains/Wounds Patient Lines/Drains/Airways Status     Active Line/Drains/Airways     Name Placement date Placement time Site Days   Peripheral IV 06/12/22 18 G Anterior;Distal;Right;Upper Arm 06/12/22  0451  Arm  less than 1   Peripheral IV 06/12/22 20 G 1" Left Antecubital 06/12/22  0720  Antecubital  less than 1   Incision (Closed) 02/01/22 Hip 02/01/22  1651  -- 131   Pressure Injury 01/31/22 Buttocks Right Deep Tissue Pressure Injury - Purple or maroon localized area of discolored  intact skin or blood-filled blister due to damage of underlying soft tissue from pressure and/or shear. DTPI evolving to Unstageable 01/31/22  2030  -- 132   Pressure Injury 01/31/22 Coccyx Right;Left Deep Tissue Pressure Injury - Purple or maroon localized area of discolored intact skin or blood-filled blister due to damage of underlying soft tissue from pressure and/or shear. DTPI evolving to unstageable 01/31/22  2030  -- 132             Intake/Output Last 24 hours  Intake/Output Summary (Last 24 hours) at 06/12/2022 1008 Last data filed at 06/12/2022 0911 Gross per 24 hour  Intake 1297.39 ml  Output --  Net 1297.39 ml    Labs/Imaging Results for orders placed or performed during the hospital encounter of 06/12/22 (from the past 48 hour(s))  CBG monitoring, ED     Status: Abnormal   Collection Time: 06/12/22  5:00 AM  Result Value Ref Range   Glucose-Capillary 376 (H) 70 - 99 mg/dL    Comment: Glucose reference range applies only to samples taken after fasting for at least 8 hours.  CBC with Differential/Platelet     Status: Abnormal   Collection Time: 06/12/22  5:19 AM  Result Value Ref Range   WBC 19.8 (H) 4.0 - 10.5 K/uL   RBC 3.14 (L) 4.22 - 5.81 MIL/uL   Hemoglobin 8.2 (L) 13.0 - 17.0 g/dL   HCT 28.0 (L) 39.0 - 52.0 %   MCV 89.2 80.0 - 100.0 fL   MCH 26.1 26.0 - 34.0 pg   MCHC 29.3 (L) 30.0 - 36.0 g/dL   RDW 17.0 (H) 11.5 - 15.5 %   Platelets 518 (H) 150 - 400 K/uL   nRBC 0.0 0.0 - 0.2 %   Neutrophils Relative % 94 %   Neutro Abs 18.6 (H) 1.7 - 7.7 K/uL   Lymphocytes Relative 2 %   Lymphs Abs 0.3 (L) 0.7 - 4.0 K/uL   Monocytes Relative 3 %   Monocytes Absolute 0.6 0.1 - 1.0 K/uL   Eosinophils Relative 0 %   Eosinophils Absolute 0.0 0.0 - 0.5 K/uL   Basophils Relative 0 %   Basophils Absolute 0.0 0.0 - 0.1 K/uL   Immature Granulocytes 1 %   Abs Immature Granulocytes 0.19 (H) 0.00 - 0.07 K/uL    Comment: Performed at [facility], [place] [address] [place], [place] [address]  Comprehensive metabolic panel     Status: Abnormal   Collection Time: 06/12/22  5:19 AM  Result Value Ref Range   Sodium 133 (L) 135 - 145 mmol/L   Potassium 4.8 3.5 - 5.1 mmol/L   Chloride 113 (H) 98 - 111 mmol/L   CO2 11 (L) 22 - 32 mmol/L   Glucose, Bld 422 (H) 70 - 99 mg/dL    Comment: Glucose reference range applies only to samples taken after fasting for at least 8 hours.   BUN 113 (H) 8 - 23  mg/dL    Comment: RESULT CONFIRMED BY MANUAL DILUTION   Creatinine, Ser 1.94 (H) 0.61 - 1.24 mg/dL   Calcium 8.1 (L) 8.9 - 10.3 mg/dL   Total Protein 6.2 (L) 6.5 - 8.1 g/dL   Albumin 1.8 (L) 3.5 - 5.0 g/dL   AST 13 (L) 15 - 41 U/L   ALT 12 0 - 44 U/L   Alkaline Phosphatase 79 38 - 126 U/L   Total Bilirubin 0.4 0.3 - 1.2 mg/dL   GFR, Estimated 34 (L) >60 mL/min  Comment: (NOTE) Calculated using the CKD-EPI Creatinine Equation (2021)    Anion gap 9 5 - 15    Comment: Performed at [facility], [place] [address] [place], [place] [address]  Lactic acid, plasma     Status: None   Collection Time: 06/12/22  5:19 AM  Result Value Ref Range   Lactic Acid, Venous 1.2 0.5 - 1.9 mmol/L    Comment: Performed at [facility], [place] [address] [place], [place] [address]  Beta-hydroxybutyric acid     Status: Abnormal   Collection Time: 06/12/22  5:19 AM  Result Value Ref Range   Beta-Hydroxybutyric Acid 0.86 (H) 0.05 - 0.27 mmol/L    Comment: Performed at [facility], [place] [address] [place], [place] [address]  Urinalysis, w/ Reflex to Culture (Infection Suspected) -Urine, Catheterized     Status: Abnormal   Collection Time: 06/12/22  5:19 AM  Result Value Ref Range   Specimen Source URINE, CATHETERIZED    Color, Urine YELLOW YELLOW   APPearance CLEAR CLEAR   Specific Gravity, Urine 1.017 1.005 - 1.030   pH 5.0 5.0 - 8.0   Glucose, UA >=500 (A) NEGATIVE mg/dL   Hgb urine dipstick NEGATIVE NEGATIVE   Bilirubin Urine NEGATIVE NEGATIVE   Ketones, ur NEGATIVE NEGATIVE mg/dL   Protein, ur 100 (A) NEGATIVE mg/dL   Nitrite NEGATIVE NEGATIVE   Leukocytes,Ua NEGATIVE NEGATIVE   RBC / HPF 0-5 0 - 5 RBC/hpf   WBC, UA 0-5 0 - 5 WBC/hpf    Comment:        Reflex urine culture not performed if WBC <=10, OR if Squamous epithelial cells >5. If Squamous epithelial cells >5 suggest recollection.    Bacteria, UA NONE SEEN NONE SEEN   Squamous  Epithelial / HPF 0-5 0 - 5 /HPF   Mucus PRESENT     Comment: Performed at [facility], [place] [address], [place], [place] [address]  Culture, blood (Routine X 2) w Reflex to ID Panel     Status: None (Preliminary result)   Collection Time: 06/12/22  7:02 AM   Specimen: Right Antecubital; Blood  Result Value Ref Range   Specimen Description RIGHT ANTECUBITAL BLOOD    Special Requests      Blood Culture adequate volume BOTTLES DRAWN AEROBIC AND ANAEROBIC Performed at [facility], [place] [address] [place], [place] [address]    Culture PENDING    Report Status PENDING   Culture, blood (Routine X 2) w Reflex to ID Panel     Status: None (Preliminary result)   Collection Time: 06/12/22  7:23 AM   Specimen: Left Antecubital; Blood  Result Value Ref Range   Specimen Description LEFT ANTECUBITAL BLOOD    Special Requests      Blood Culture adequate volume BOTTLES DRAWN AEROBIC AND ANAEROBIC Performed at [facility], [place] [address] [place], [place] [address]    Culture PENDING    Report Status PENDING    CT Angio Chest Pulmonary Embolism (PE) W or WO Contrast  Result Date: 06/12/2022 CLINICAL DATA:  Altered mental status. Abnormal masslike opacity on chest radiograph. Clinical suspicion for pulmonary infarct and embolism. EXAM: CT ANGIOGRAPHY CHEST WITH CONTRAST TECHNIQUE: Multidetector CT imaging of the chest was performed using the standard protocol during bolus administration of intravenous contrast. Multiplanar CT image reconstructions and MIPs were obtained to evaluate the vascular anatomy. RADIATION DOSE REDUCTION: This exam was performed according to the departmental dose-optimization  program which includes automated exposure control, adjustment of the mA and/or kV according to patient size and/or use of iterative reconstruction technique. CONTRAST:  42m OMNIPAQUE IOHEXOL 350 MG/ML SOLN COMPARISON:  None Available. FINDINGS: Cardiovascular: Satisfactory  opacification of pulmonary arteries noted, and no pulmonary emboli identified. No evidence of thoracic aortic dissection or aneurysm. Aortic and coronary atherosclerotic calcification incidentally noted. Mediastinum/Nodes: No masses or pathologically enlarged lymph nodes identified. Lungs/Pleura: Pulmonary consolidation is seen in the right middle lobe, with less severe airspace disease in the peripheral right upper lobe highly suspicious for pneumonia. Mild centrilobular emphysema noted. No evidence of pleural effusion. Upper abdomen: No acute findings. Musculoskeletal: No suspicious bone lesions identified. Review of the MIP images confirms the above findings. IMPRESSION: No evidence of pulmonary embolism. Pulmonary consolidation in right middle lobe and airspace disease in right upper lobe, highly suspicious for pneumonia. Aortic Atherosclerosis (ICD10-I70.0) and Emphysema (ICD10-J43.9). Electronically Signed   By: JMarlaine HindM.D.   On: 06/12/2022 09:24   CT HEAD WO CONTRAST (5MM)  Result Date: 06/12/2022 CLINICAL DATA:  82year old male with history of altered mental status. EXAM: CT HEAD WITHOUT CONTRAST TECHNIQUE: Contiguous axial images were obtained from the base of the skull through the vertex without intravenous contrast. RADIATION DOSE REDUCTION: This exam was performed according to the departmental dose-optimization program which includes automated exposure control, adjustment of the mA and/or kV according to patient size and/or use of iterative reconstruction technique. COMPARISON:  No priors. FINDINGS: Brain: Moderate cerebral and mild cerebellar atrophy. Ex vacuo dilatation of the ventricular system. Patchy and confluent areas of decreased attenuation are noted throughout the deep and periventricular white matter of the cerebral hemispheres bilaterally, compatible with chronic microvascular ischemic disease. No evidence of acute infarction, hemorrhage, hydrocephalus, extra-axial collection or  mass lesion/mass effect. Vascular: No hyperdense vessel or unexpected calcification. Skull: Normal. Negative for fracture or focal lesion. Sinuses/Orbits: No acute finding. Other: None. IMPRESSION: 1. No acute intracranial abnormalities. 2. Moderate cerebral and mild cerebellar atrophy with chronic microvascular ischemic changes in the cerebral white matter, as above. Electronically Signed   By: DVinnie LangtonM.D.   On: 06/12/2022 06:34   DG Chest Port 1 View  Result Date: 06/12/2022 CLINICAL DATA:  82year old male with history of altered mental status. EXAM: PORTABLE CHEST 1 VIEW COMPARISON:  Chest x-ray 02/26/2022. FINDINGS: New airspace consolidation in the periphery of the right lung near the base, somewhat wedge-shaped in appearance. Left lung is clear. No definite pleural effusions. No pneumothorax. No evidence of pulmonary edema. Heart size is normal. The patient is rotated to the right on today's exam, resulting in distortion of the mediastinal contours and reduced diagnostic sensitivity and specificity for mediastinal pathology. Atherosclerotic calcifications in the thoracic aorta. IMPRESSION: 1. New wedge-shaped area of consolidation in the periphery of the right lung base either in the right middle or lower lobes. In the appropriate clinical setting, this may simply represent a pneumonia. Alternatively, the possibility of alveolar hemorrhage from pulmonary embolism should be considered. Followup PA and lateral chest X-ray is recommended in 3-4 weeks following trial of antibiotic therapy to ensure resolution and exclude underlying malignancy. If pulmonary embolism is of concern, further evaluation with PE protocol CT scan should be obtained at this time. 2. Aortic atherosclerosis. Electronically Signed   By: DVinnie LangtonM.D.   On: 06/12/2022 06:32   DG Hand Complete Left  Result Date: 06/12/2022 CLINICAL DATA:  82year old male with history of left hand swelling. Altered mental status.  EXAM:  LEFT HAND - COMPLETE 3+ VIEW COMPARISON:  No priors. FINDINGS: Abnormal angulation of the thumb at the first metacarpal phalangeal joint, suggesting severe subluxation or dislocation, although poorly evaluated on today's nonstandard images. No acute displaced fracture. Severe multifocal joint space narrowing, subchondral sclerosis, subchondral cyst formation and osteophyte formation noted throughout the hand and wrist, most severe at the D IP and PIP joints, indicative of advanced osteoarthritis. IMPRESSION: 1. Severe subluxation or dislocation of the thumb at the first MCP joint. 2. Advanced osteoarthritis in the hand, as above. Electronically Signed   By: Vinnie Langton M.D.   On: 06/12/2022 06:30    Pending Labs Unresulted Labs (From admission, onward)    None       Vitals/Pain Today's Vitals   06/12/22 0515 06/12/22 0725 06/12/22 0830 06/12/22 0919  BP: 139/73  136/82   Pulse: 92  89   Resp: (!) 21  14   Temp:    (!) 97.4 F (36.3 C)  TempSrc:    Rectal  SpO2: 100%  100%   Weight:      Height:      PainSc:  0-No pain      Isolation Precautions No active isolations  Medications Medications  lactated ringers bolus 1,000 mL (0 mLs Intravenous Stopped 06/12/22 0746)  insulin aspart (novoLOG) injection 7 Units (7 Units Subcutaneous Given 06/12/22 0626)  vancomycin (VANCOCIN) IVPB 1000 mg/200 mL premix (0 mg Intravenous Stopped 06/12/22 0911)  ceFEPIme (MAXIPIME) 2 g in sodium chloride 0.9 % 100 mL IVPB (0 g Intravenous Stopped 06/12/22 0835)  sodium chloride (PF) 0.9 % injection (  Given by Other 06/12/22 0823)  iohexol (OMNIPAQUE) 350 MG/ML injection 75 mL (75 mLs Intravenous Contrast Given 06/12/22 0849)    Mobility non-ambulatory     Focused Assessments Neuro Assessment Handoff:  Swallow screen pass?  N/A     Last date known well: 06/11/22   Neuro Assessment: Within Defined Limits Neuro Checks:      Has TPA been given? No If patient is a Neuro Trauma and  patient is going to OR before floor call report to [facility] nurse: [phone] or [phone]   R Recommendations: See Admitting Provider Note  Report given to:   Additional Notes: N/A

## 2022-06-12 NOTE — ED Triage Notes (Signed)
Pt from home and family woke him up tonight to change pt. Pt was not answering any questions. Pt is usually alert and oriented x3 but tonight is altered. Pt is seen at home by nurses. Pt has multiple wounds on back. Pt has left hand swelling. Family noticed decline over last two weeks. History CKD HLD, and falls. Pt is on eliquis

## 2022-06-12 NOTE — ED Notes (Signed)
Cleaned pt and changed brief. Pt disimpacted by MD. Wound care preformed.

## 2022-06-12 NOTE — ED Provider Notes (Signed)
[place] EMERGENCY DEPARTMENT AT [facility] Provider Note   CSN: [id number] Arrival date & time: 06/12/22  0445     History  Chief Complaint  Patient presents with   Altered Mental Status    Jason Mcpherson is a 82 y.o. male.  Patient presents to the emergency department from home.  Family report that they noticed this morning that he is altered.  He is generally alert and oriented, able to communicate.  He is not answering any questions now.  Family reports that he has not been doing well for a couple of weeks.       Home Medications Prior to Admission medications   Medication Sig Start Date End Date Taking? Authorizing Provider  acetaminophen (TYLENOL) 500 MG tablet Take 1,000 mg by mouth every 6 (six) hours as needed for mild pain.    [provider]  amLODipine (NORVASC) 5 MG tablet TAKE 1 TABLET (5 MG TOTAL) BY MOUTH DAILY. 07/05/20   Elayne Snare, MD  apixaban (ELIQUIS) 5 MG TABS tablet Take 2 tablets (10 mg total) by mouth 2 (two) times daily for 4 days. 02/07/22 02/11/22  Hosie Poisson, MD  apixaban (ELIQUIS) 5 MG TABS tablet Take 1 tablet (5 mg total) by mouth 2 (two) times daily. 02/12/22   Hosie Poisson, MD  bisacodyl (DULCOLAX) 10 MG suppository Place 1 suppository (10 mg total) rectally daily as needed for moderate constipation. 02/07/22   Hosie Poisson, MD  docusate sodium (COLACE) 100 MG capsule Take 1 capsule (100 mg total) by mouth 2 (two) times daily. 02/07/22   Hosie Poisson, MD  Ensure Max Protein (ENSURE MAX PROTEIN) LIQD Take 330 mLs (11 oz total) by mouth 2 (two) times daily. 02/07/22 02/07/23  Hosie Poisson, MD  FARXIGA 5 MG TABS tablet Take 5 mg by mouth daily. 01/15/22   [provider]  ferrous sulfate 325 (65 FE) MG tablet Take 325 mg by mouth every morning. 12/28/21   [provider]  glimepiride (AMARYL) 1 MG tablet Take 1.5 mg by mouth every morning. 12/11/21   [provider]  glucose blood (ONETOUCH ULTRA)  test strip USE AS DIRECTED TO TEST TWICE DAILY 05/13/20   Elayne Snare, MD  HYDROcodone-acetaminophen (NORCO/VICODIN) 5-325 MG tablet Take 1 tablet by mouth every 4 (four) hours as needed for moderate pain (pain score 4-6). 02/03/22   Paralee Cancel, MD  Lancets [facility] DELICA PLUS [id number]) [place] 2X DAILY. 06/06/20   Elayne Snare, MD  Multiple Vitamin (MULTIVITAMIN WITH MINERALS) TABS tablet Take 1 tablet by mouth daily. 02/08/22   Hosie Poisson, MD  [facility] DELICA LANCETS FINE MISC USED TO CHECK SUGAR 2X DAILY. 12/28/16   Elayne Snare, MD  polyethylene glycol (MIRALAX / GLYCOLAX) 17 g packet Take 17 g by mouth daily as needed for mild constipation. 02/07/22   Hosie Poisson, MD  simvastatin (ZOCOR) 20 MG tablet TAKE 1 TABLET BY MOUTH EVERY DAY IN THE EVENING Patient taking differently: Take 20 mg by mouth every evening. 03/30/20   Elayne Snare, MD  torsemide (DEMADEX) 10 MG tablet Take 10 mg by mouth daily as needed for edema. 10/19/21   [provider]  Vitamin D, Ergocalciferol, (DRISDOL) 1.25 MG (50000 UNIT) CAPS capsule Take 50,000 Units by mouth once a week. Saturday 01/05/22   [provider]      Allergies    Amlodipine    Review of Systems   Review of Systems  Physical Exam Updated Vital Signs  BP 139/73   Pulse 92   Resp (!) 21   Ht 5' 8"$  (1.727 m)   Wt 71.7 kg   SpO2 100%   BMI 24.03 kg/m  Physical Exam Vitals and nursing note reviewed.  Constitutional:      General: He is not in acute distress.    Appearance: He is well-developed.  HENT:     Head: Normocephalic and atraumatic.     Ears:     Comments: Very hard of hearing    Mouth/Throat:     Mouth: Mucous membranes are moist.  Eyes:     General: Vision grossly intact. Gaze aligned appropriately.     Extraocular Movements: Extraocular movements intact.     Conjunctiva/sclera: Conjunctivae normal.  Cardiovascular:     Rate and Rhythm: Normal rate and regular rhythm.     Pulses: Normal  pulses.     Heart sounds: Normal heart sounds, S1 normal and S2 normal. No murmur heard.    No friction rub. No gallop.  Pulmonary:     Effort: Pulmonary effort is normal. No respiratory distress.     Breath sounds: Normal breath sounds.  Abdominal:     Palpations: Abdomen is soft.     Tenderness: There is no abdominal tenderness. There is no guarding or rebound.     Hernia: No hernia is present.  Musculoskeletal:        General: No swelling.     Left hand: Swelling present. No deformity or tenderness. Normal range of motion.     Cervical back: Full passive range of motion without pain, normal range of motion and neck supple. No pain with movement, spinous process tenderness or muscular tenderness. Normal range of motion.     Right lower leg: No edema.     Left lower leg: No edema.  Skin:    General: Skin is warm and dry.     Capillary Refill: Capillary refill takes less than 2 seconds.     Findings: Wound present. No ecchymosis, erythema or lesion.     Comments: Large decubitus left buttock with black eschar Smaller but deeper decubitus right buttock with black eschar  Neurological:     Mental Status: He is alert and oriented to person, place, and time.     GCS: GCS eye subscore is 4. GCS verbal subscore is 5. GCS motor subscore is 6.     Cranial Nerves: Cranial nerves 2-12 are intact.     Sensory: Sensation is intact.     Motor: Motor function is intact. No weakness or abnormal muscle tone.     Coordination: Coordination is intact.     ED Results / Procedures / Treatments   Labs (all labs ordered are listed, but only abnormal results are displayed) Labs Reviewed  CBC WITH DIFFERENTIAL/PLATELET - Abnormal; Notable for the following components:      Result Value   WBC 19.8 (*)    RBC 3.14 (*)    Hemoglobin 8.2 (*)    HCT 28.0 (*)    MCHC 29.3 (*)    RDW 17.0 (*)    Platelets 518 (*)    Neutro Abs 18.6 (*)    Lymphs Abs 0.3 (*)    Abs Immature Granulocytes 0.19 (*)     All other components within normal limits  COMPREHENSIVE METABOLIC PANEL - Abnormal; Notable for the following components:   Sodium 133 (*)    Chloride 113 (*)    CO2 11 (*)    Glucose, Bld 422 (*)  BUN 113 (*)    Creatinine, Ser 1.94 (*)    Calcium 8.1 (*)    Total Protein 6.2 (*)    Albumin 1.8 (*)    AST 13 (*)    GFR, Estimated 34 (*)    All other components within normal limits  BETA-HYDROXYBUTYRIC ACID - Abnormal; Notable for the following components:   Beta-Hydroxybutyric Acid 0.86 (*)    All other components within normal limits  URINALYSIS, W/ REFLEX TO CULTURE (INFECTION SUSPECTED) - Abnormal; Notable for the following components:   Glucose, UA >=500 (*)    Protein, ur 100 (*)    All other components within normal limits  CBG MONITORING, ED - Abnormal; Notable for the following components:   Glucose-Capillary 376 (*)    All other components within normal limits  CULTURE, BLOOD (ROUTINE X 2)  CULTURE, BLOOD (ROUTINE X 2)  LACTIC ACID, PLASMA    EKG None  Radiology CT HEAD WO CONTRAST (5MM)  Result Date: 06/12/2022 CLINICAL DATA:  82 year old male with history of altered mental status. EXAM: CT HEAD WITHOUT CONTRAST TECHNIQUE: Contiguous axial images were obtained from the base of the skull through the vertex without intravenous contrast. RADIATION DOSE REDUCTION: This exam was performed according to the departmental dose-optimization program which includes automated exposure control, adjustment of the mA and/or kV according to patient size and/or use of iterative reconstruction technique. COMPARISON:  No priors. FINDINGS: Brain: Moderate cerebral and mild cerebellar atrophy. Ex vacuo dilatation of the ventricular system. Patchy and confluent areas of decreased attenuation are noted throughout the deep and periventricular white matter of the cerebral hemispheres bilaterally, compatible with chronic microvascular ischemic disease. No evidence of acute infarction,  hemorrhage, hydrocephalus, extra-axial collection or mass lesion/mass effect. Vascular: No hyperdense vessel or unexpected calcification. Skull: Normal. Negative for fracture or focal lesion. Sinuses/Orbits: No acute finding. Other: None. IMPRESSION: 1. No acute intracranial abnormalities. 2. Moderate cerebral and mild cerebellar atrophy with chronic microvascular ischemic changes in the cerebral white matter, as above. Electronically Signed   By: Vinnie Langton M.D.   On: 06/12/2022 06:34   DG Chest Port 1 View  Result Date: 06/12/2022 CLINICAL DATA:  82 year old male with history of altered mental status. EXAM: PORTABLE CHEST 1 VIEW COMPARISON:  Chest x-ray 02/26/2022. FINDINGS: New airspace consolidation in the periphery of the right lung near the base, somewhat wedge-shaped in appearance. Left lung is clear. No definite pleural effusions. No pneumothorax. No evidence of pulmonary edema. Heart size is normal. The patient is rotated to the right on today's exam, resulting in distortion of the mediastinal contours and reduced diagnostic sensitivity and specificity for mediastinal pathology. Atherosclerotic calcifications in the thoracic aorta. IMPRESSION: 1. New wedge-shaped area of consolidation in the periphery of the right lung base either in the right middle or lower lobes. In the appropriate clinical setting, this may simply represent a pneumonia. Alternatively, the possibility of alveolar hemorrhage from pulmonary embolism should be considered. Followup PA and lateral chest X-ray is recommended in 3-4 weeks following trial of antibiotic therapy to ensure resolution and exclude underlying malignancy. If pulmonary embolism is of concern, further evaluation with PE protocol CT scan should be obtained at this time. 2. Aortic atherosclerosis. Electronically Signed   By: Vinnie Langton M.D.   On: 06/12/2022 06:32   DG Hand Complete Left  Result Date: 06/12/2022 CLINICAL DATA:  82 year old male with  history of left hand swelling. Altered mental status. EXAM: LEFT HAND - COMPLETE 3+ VIEW COMPARISON:  No priors.  FINDINGS: Abnormal angulation of the thumb at the first metacarpal phalangeal joint, suggesting severe subluxation or dislocation, although poorly evaluated on today's nonstandard images. No acute displaced fracture. Severe multifocal joint space narrowing, subchondral sclerosis, subchondral cyst formation and osteophyte formation noted throughout the hand and wrist, most severe at the D IP and PIP joints, indicative of advanced osteoarthritis. IMPRESSION: 1. Severe subluxation or dislocation of the thumb at the first MCP joint. 2. Advanced osteoarthritis in the hand, as above. Electronically Signed   By: Vinnie Langton M.D.   On: 06/12/2022 06:30    Procedures Fecal disimpaction  Date/Time: 06/12/2022 6:51 AM  Performed by: Orpah Greek, MD Authorized by: Orpah Greek, MD  Consent: Verbal consent obtained. Risks and benefits: risks, benefits and alternatives were discussed Consent given by: patient Site marked: the operative site was marked Imaging studies: imaging studies available Patient identity confirmed: hospital-assigned identification number Time out: Immediately prior to procedure a "time out" was called to verify the correct patient, procedure, equipment, support staff and site/side marked as required. Preparation: Patient was prepped and draped in the usual sterile fashion. Local anesthesia used: no  Anesthesia: Local anesthesia used: no  Sedation: Patient sedated: no  Patient tolerance: patient tolerated the procedure well with no immediate complications       Medications Ordered in ED Medications  lactated ringers bolus 1,000 mL (1,000 mLs Intravenous New Bag/Given 06/12/22 0628)  insulin aspart (novoLOG) injection 7 Units (7 Units Subcutaneous Given 06/12/22 [id number])    ED Course/ Medical Decision Making/ A&P                              Medical Decision Making Amount and/or Complexity of Data Reviewed Independent Historian: spouse and EMS External Data Reviewed: labs and notes. Labs: ordered. Decision-making details documented in ED Course. Radiology: ordered and independent interpretation performed. Decision-making details documented in ED Course.  Risk Prescription drug management.   He was brought to the emergency from home after family found him altered.  Patient normally is alert and oriented but was difficult to arouse this morning.  Upon arrival to the ER he is alert.  Initially it seemed like he was not responding but it was then discovered that he is very hard of hearing.  He is reasonably oriented at this time.  Blood pressure normal on arrival.  No hypoxia, tachycardia or tachypnea.  Workup for possible infection causing encephalopathy was initiated.  Patient did seem to have foul-smelling urine at arrival but his urinalysis does not suggest infection.  A chest x-ray, however, shows a wedge defect in the right lung that could be pneumonia.  This could, however, also be an infarct from PE.  Patient is nonambulatory, bedbound, is at risk for clotting.  Will perform CT scan to further evaluate area.  Patient found to have significant decubitus ulcers.  There is some surrounding erythema and induration.  All of the areas have heavy eschar and therefore staging is difficult.  Could be a source of infection as well.  Patient given IV fluid bolus prior to CT scan.  Initiated on empiric treatment with cefepime and vancomycin for pneumonia and skin infection.  Will sign out to oncoming ER physician to follow-up CT scan.  Patient will require hospitalization.        Final Clinical Impression(s) / ED Diagnoses Final diagnoses:  Altered mental status, unspecified altered mental status type  Pneumonia of right lung due to  infectious organism, unspecified part of lung  Pressure injury of skin of contiguous region  involving back, buttock, and hip, unspecified injury stage, unspecified laterality    Rx / DC Orders ED Discharge Orders     None         Jalesia Loudenslager, Gwenyth Allegra, MD 06/12/22 [phone]

## 2022-06-12 NOTE — Progress Notes (Signed)
SLP Cancellation Note  Patient Details Name: Jason Mcpherson MRN: [id number] DOB: July 02, 1940   Cancelled treatment:       Reason Eval/Treat Not Completed: Fatigue/lethargy limiting ability to participate. Patient would only open eyes briefly then go back to sleep. SLP will plan to return in AM next date. If he awakens adequately, would recommend ice chips, water sips.  Sonia Baller, MA, CCC-SLP Speech Therapy

## 2022-06-12 NOTE — ED Notes (Signed)
Patient transported to CT 

## 2022-06-12 NOTE — Progress Notes (Signed)
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  Pharmacy has been consulted for Vanco, Cefepime dosing.  Active Problem(s): AMS , multiple wounds on back. Pt has left hand swelling    ID: Cellulitis with significant wounds to backside., CT imaging is suggestive of possible pneumonia.  - Afebrile. WBC 19.8, Scr 1.94  Vanco 2/10>> Cefepime 2/10>>  Plan: Cefepime 2g IV q24h Vanco 1g IV x 1 in ED Vancomycin 750 mg IV Q 24 hrs. Goal AUC 400-550. Expected AUC: 519 SCr used: 1.94     Height: 5' 8"$  (172.7 cm) Weight: 71.7 kg (158 lb 1.1 oz) IBW/kg (Calculated) : 68.4  Temp (24hrs), Avg:98.2 F (36.8 C), Min:97.4 F (36.3 C), Max:99 F (37.2 C)  Recent Labs  Lab 06/12/22 0519  WBC 19.8*  CREATININE 1.94*  LATICACIDVEN 1.2    Estimated Creatinine Clearance: 28.4 mL/min (A) (by C-G formula based on SCr of 1.94 mg/dL (H)).    Allergies  Allergen Reactions   Amlodipine Swelling    ANKLE EDEMA    Nyheem Binette S. Alford Highland, PharmD, BCPS Clinical Staff Pharmacist [email or web address]  Wayland Salinas 06/12/2022 12:06 PM

## 2022-06-12 NOTE — Progress Notes (Signed)
A consult was received from an ED physician for Vanco, Cefepime per pharmacy dosing.  The patient's profile has been reviewed for ht/wt/allergies/indication/available labs.   A one time order has been placed for Vanco 1g IV x 1 and Cefepime 2g IV x 1.  Further antibiotics/pharmacy consults should be ordered by admitting physician if indicated.                        Shanora Christensen S. Alford Highland, PharmD, BCPS Clinical Staff Pharmacist [email or web address] Thank you, Wayland Salinas 06/12/2022  7:39 AM

## 2022-06-12 NOTE — Progress Notes (Signed)
PHARMACY - PHYSICIAN COMMUNICATION CRITICAL VALUE ALERT - BLOOD CULTURE IDENTIFICATION (BCID)  Jason Mcpherson is an 82 y.o. male who presented to [facility] on [facility]/12/2022 with a chief complaint of AMS and  large decubitus wounds.  Assessment:  4/4 enterococcus faecalis, staph aureus, staph epi, strep species, proteus species  Name of physician (or Provider) Contacted: J Mansy  Current antibiotics: cefepime and vanc, flagyl  Changes to prescribed antibiotics recommended:  Per Dr Tommy Medal consult, start zosyn 3.337m IV q8h Cefepime d/c'd Continue with vanc and flagyl  Results for orders placed or performed during the hospital encounter of 06/12/22  Blood Culture ID Panel (Reflexed) (Collected: 06/12/2022  7:02 AM)  Result Value Ref Range   Enterococcus faecalis DETECTED (A) NOT DETECTED   Enterococcus Faecium NOT DETECTED NOT DETECTED   Listeria monocytogenes NOT DETECTED NOT DETECTED   Staphylococcus species DETECTED (A) NOT DETECTED   Staphylococcus aureus (BCID) DETECTED (A) NOT DETECTED   Staphylococcus epidermidis DETECTED (A) NOT DETECTED   Staphylococcus lugdunensis NOT DETECTED NOT DETECTED   Streptococcus species DETECTED (A) NOT DETECTED   Streptococcus agalactiae NOT DETECTED NOT DETECTED   Streptococcus pneumoniae NOT DETECTED NOT DETECTED   Streptococcus pyogenes NOT DETECTED NOT DETECTED   A.calcoaceticus-baumannii NOT DETECTED NOT DETECTED   Bacteroides fragilis NOT DETECTED NOT DETECTED   Enterobacterales DETECTED (A) NOT DETECTED   Enterobacter cloacae complex NOT DETECTED NOT DETECTED   Escherichia coli NOT DETECTED NOT DETECTED   Klebsiella aerogenes NOT DETECTED NOT DETECTED   Klebsiella oxytoca NOT DETECTED NOT DETECTED   Klebsiella pneumoniae NOT DETECTED NOT DETECTED   Proteus species DETECTED (A) NOT DETECTED   Salmonella species NOT DETECTED NOT DETECTED   Serratia marcescens NOT DETECTED NOT DETECTED   Haemophilus influenzae NOT DETECTED NOT DETECTED    Neisseria meningitidis NOT DETECTED NOT DETECTED   Pseudomonas aeruginosa NOT DETECTED NOT DETECTED   Stenotrophomonas maltophilia NOT DETECTED NOT DETECTED   Candida albicans NOT DETECTED NOT DETECTED   Candida auris NOT DETECTED NOT DETECTED   Candida glabrata NOT DETECTED NOT DETECTED   Candida krusei NOT DETECTED NOT DETECTED   Candida parapsilosis NOT DETECTED NOT DETECTED   Candida tropicalis NOT DETECTED NOT DETECTED   Cryptococcus neoformans/gattii NOT DETECTED NOT DETECTED   CTX-M ESBL NOT DETECTED NOT DETECTED   Carbapenem resistance IMP NOT DETECTED NOT DETECTED   Carbapenem resistance KPC NOT DETECTED NOT DETECTED   Methicillin resistance mecA/C NOT DETECTED NOT DETECTED   Meth resistant mecA/C and MREJ NOT DETECTED NOT DETECTED   Carbapenem resistance NDM NOT DETECTED NOT DETECTED   Carbapenem resist OXA 48 LIKE NOT DETECTED NOT DETECTED   Vancomycin resistance NOT DETECTED NOT DETECTED   Carbapenem resistance VIM NOT DETECTED NOT DETECTED    EDolly RiasRPh 06/12/2022, 11:55 PM

## 2022-06-13 DIAGNOSIS — J690 Pneumonitis due to inhalation of food and vomit: Secondary | ICD-10-CM | POA: Diagnosis not present

## 2022-06-13 DIAGNOSIS — R627 Adult failure to thrive: Secondary | ICD-10-CM | POA: Diagnosis not present

## 2022-06-13 DIAGNOSIS — E1122 Type 2 diabetes mellitus with diabetic chronic kidney disease: Secondary | ICD-10-CM

## 2022-06-13 DIAGNOSIS — N1832 Chronic kidney disease, stage 3b: Secondary | ICD-10-CM | POA: Diagnosis not present

## 2022-06-13 DIAGNOSIS — Z515 Encounter for palliative care: Secondary | ICD-10-CM | POA: Diagnosis not present

## 2022-06-13 DIAGNOSIS — L89323 Pressure ulcer of left buttock, stage 3: Secondary | ICD-10-CM | POA: Diagnosis not present

## 2022-06-13 DIAGNOSIS — R7881 Bacteremia: Secondary | ICD-10-CM

## 2022-06-13 DIAGNOSIS — J189 Pneumonia, unspecified organism: Secondary | ICD-10-CM | POA: Diagnosis not present

## 2022-06-13 DIAGNOSIS — Z7189 Other specified counseling: Secondary | ICD-10-CM

## 2022-06-13 DIAGNOSIS — Z87891 Personal history of nicotine dependence: Secondary | ICD-10-CM

## 2022-06-13 DIAGNOSIS — E1165 Type 2 diabetes mellitus with hyperglycemia: Secondary | ICD-10-CM

## 2022-06-13 DIAGNOSIS — A499 Bacterial infection, unspecified: Secondary | ICD-10-CM

## 2022-06-13 LAB — CBC
HCT: 24.2 % — ABNORMAL LOW (ref 39.0–52.0)
Hemoglobin: 7.3 g/dL — ABNORMAL LOW (ref 13.0–17.0)
MCH: 26.2 pg (ref 26.0–34.0)
MCHC: 30.2 g/dL (ref 30.0–36.0)
MCV: 86.7 fL (ref 80.0–100.0)
Platelets: 304 10*3/uL (ref 150–400)
RBC: 2.79 MIL/uL — ABNORMAL LOW (ref 4.22–5.81)
RDW: 16.9 % — ABNORMAL HIGH (ref 11.5–15.5)
WBC: 18.7 10*3/uL — ABNORMAL HIGH (ref 4.0–10.5)
nRBC: 0 % (ref 0.0–0.2)

## 2022-06-13 LAB — COMPREHENSIVE METABOLIC PANEL
ALT: 12 U/L (ref 0–44)
AST: 12 U/L — ABNORMAL LOW (ref 15–41)
Albumin: 1.6 g/dL — ABNORMAL LOW (ref 3.5–5.0)
Alkaline Phosphatase: 64 U/L (ref 38–126)
Anion gap: 10 (ref 5–15)
BUN: 108 mg/dL — ABNORMAL HIGH (ref 8–23)
CO2: 11 mmol/L — ABNORMAL LOW (ref 22–32)
Calcium: 8.2 mg/dL — ABNORMAL LOW (ref 8.9–10.3)
Chloride: 115 mmol/L — ABNORMAL HIGH (ref 98–111)
Creatinine, Ser: 2.3 mg/dL — ABNORMAL HIGH (ref 0.61–1.24)
GFR, Estimated: 28 mL/min — ABNORMAL LOW (ref >60)
Glucose, Bld: 89 mg/dL (ref 70–99)
Potassium: 4.6 mmol/L (ref 3.5–5.1)
Sodium: 136 mmol/L (ref 135–145)
Total Bilirubin: 0.3 mg/dL (ref 0.3–1.2)
Total Protein: 5.2 g/dL — ABNORMAL LOW (ref 6.5–8.1)

## 2022-06-13 LAB — GLUCOSE, CAPILLARY: Glucose-Capillary: 102 mg/dL — ABNORMAL HIGH (ref 70–99)

## 2022-06-13 MED ORDER — HALOPERIDOL LACTATE 5 MG/ML IJ SOLN: 0.5000 mg | INTRAMUSCULAR | Status: DC | PRN

## 2022-06-13 MED ORDER — TRAZODONE HCL 50 MG PO TABS: 25.0000 mg | ORAL_TABLET | Freq: Every evening | ORAL | Status: DC | PRN

## 2022-06-13 MED ORDER — POLYVINYL ALCOHOL 1.4 % OP SOLN: 1.0000 [drp] | Freq: Four times a day (QID) | OPHTHALMIC | Status: DC | PRN

## 2022-06-13 MED ORDER — ACETAMINOPHEN 325 MG PO TABS: 650.0000 mg | ORAL_TABLET | Freq: Four times a day (QID) | ORAL | Status: DC | PRN

## 2022-06-13 MED ORDER — OXYCODONE HCL 5 MG PO TABS
5.0000 mg | ORAL_TABLET | ORAL | Status: DC | PRN
  Administered 2022-06-13 – 2022-06-14 (×2): 5 mg via ORAL
  Filled 2022-06-13 (×2): qty 1

## 2022-06-13 MED ORDER — ONDANSETRON 4 MG PO TBDP: 4.0000 mg | ORAL_TABLET | Freq: Four times a day (QID) | ORAL | Status: DC | PRN

## 2022-06-13 MED ORDER — ACETAMINOPHEN 650 MG RE SUPP: 650.0000 mg | Freq: Four times a day (QID) | RECTAL | Status: DC | PRN

## 2022-06-13 MED ORDER — TRAMADOL HCL 50 MG PO TABS
50.0000 mg | ORAL_TABLET | Freq: Two times a day (BID) | ORAL | Status: DC | PRN
  Administered 2022-06-13: 50 mg via ORAL
  Filled 2022-06-13: qty 1

## 2022-06-13 MED ORDER — GLYCOPYRROLATE 0.2 MG/ML IJ SOLN: 0.2000 mg | INTRAMUSCULAR | Status: DC | PRN

## 2022-06-13 MED ORDER — ONDANSETRON HCL 4 MG/2ML IJ SOLN: 4.0000 mg | Freq: Four times a day (QID) | INTRAMUSCULAR | Status: DC | PRN

## 2022-06-13 MED ORDER — HALOPERIDOL LACTATE 2 MG/ML PO CONC
0.5000 mg | ORAL | Status: DC | PRN
  Filled 2022-06-13: qty 5

## 2022-06-13 MED ORDER — ORAL CARE MOUTH RINSE
15.0000 mL | OROMUCOSAL | Status: DC
  Administered 2022-06-14 (×3): 15 mL via OROMUCOSAL

## 2022-06-13 MED ORDER — LORAZEPAM 2 MG/ML PO CONC: 1.0000 mg | Freq: Four times a day (QID) | ORAL | Status: DC | PRN

## 2022-06-13 MED ORDER — STERILE WATER FOR INJECTION IV SOLN
INTRAVENOUS | Status: DC
  Filled 2022-06-13: qty 150

## 2022-06-13 MED ORDER — GLYCOPYRROLATE 1 MG PO TABS
1.0000 mg | ORAL_TABLET | ORAL | Status: DC | PRN
  Filled 2022-06-13: qty 1

## 2022-06-13 MED ORDER — HYDROMORPHONE HCL 1 MG/ML IJ SOLN
0.5000 mg | INTRAMUSCULAR | Status: DC | PRN
  Administered 2022-06-14 (×2): 0.5 mg via INTRAVENOUS
  Filled 2022-06-13 (×2): qty 0.5

## 2022-06-13 MED ORDER — ORAL CARE MOUTH RINSE: 15.0000 mL | OROMUCOSAL | Status: DC | PRN

## 2022-06-13 MED ORDER — BIOTENE DRY MOUTH MT LIQD: 15.0000 mL | OROMUCOSAL | Status: DC | PRN

## 2022-06-13 MED ORDER — HALOPERIDOL 0.5 MG PO TABS: 0.5000 mg | ORAL_TABLET | ORAL | Status: DC | PRN

## 2022-06-13 NOTE — Evaluation (Signed)
Clinical/Bedside Swallow Evaluation Patient Details  Name: Jason Mcpherson MRN: [id number] Date of Birth: October 14, 1940  Today's Date: 06/13/2022 Time: SLP Start Time (ACUTE ONLY): 64 SLP Stop Time (ACUTE ONLY): 0840 SLP Time Calculation (min) (ACUTE ONLY): 20 min  Past Medical History:  Past Medical History:  Diagnosis Date   Anemia    Arthritis    KNEES & BACK   Cellulitis 08/2013   RT LEG   Chronic kidney disease 08/2013   ACUTE RENAL    Dehydration    Diabetes mellitus without complication ([place])    Hyperkalemia 08/2013   Hyperlipemia    Hypertension    Past Surgical History:  Past Surgical History:  Procedure Laterality Date   ANTERIOR CERVICAL DECOMP/DISCECTOMY FUSION N/A 10/17/2012   Procedure: ANTERIOR CERVICAL DECOMPRESSION/DISCECTOMY FUSION 2 LEVELS;  Surgeon: Charlie Pitter, MD;  Location: [facility];  Service: Neurosurgery;  Laterality: N/A;  Cervical three-four, four-five anterior cervical decompression fusion with allograft plating   BACK SURGERY     10 years ago   [place] IM NAIL Right 02/01/2022   Procedure: INTRAMEDULLARY (IM) NAIL FEMORAL;  Surgeon: Paralee Cancel, MD;  Location: [facility];  Service: Orthopedics;  Laterality: Right;   HPI:  Patient is an 82 y.o. male with PMH: chronic anemia, osteoarthritis of knees and back, cervical spinal stenosis, right LE cellulitis, DM-2 (insulin controlled), HTN, HLD, h/o falls, h/o right hip fracture, severe malnutrition and suspected GERD per SLP note from admission in October 2023. He was brought to ED from home on 06/12/22 due to AMS. UA showed glucosuria more than 500 and proteinuria 100 mg/dL. Left hand x-ray showed severe subluxation and dislocation of thumb and advanced osteoarthritis in hand. Portable CXR showed  new wedge-shaped area of consolidation in the periphery of the right lung base either in the right middle or lower lobes; CTA negative for PE but showed pulmonary consolidation in the right  middle lobe and airspace disease in the right upper lobe highly suspicious for pneumonia. CT head negative for acute intracranial abnormalities but showed moderate cerebral and mild cerebellar atrophy with chronic microvascular ischemic changes in the cerebral white matter. He failed [facility] with RN on 06/12/22 and was kept NPO awaiting SLP evaluation of swallow.    Assessment / Plan / Recommendation  Clinical Impression  Patient presented with functional oropharyngeal swallow aside from one delayed, dry sounding cough and suspected mild swallow initiation delay when he started drinking thin liquids (water). He was able to self-feed with applesauce after SLP assisted with setup. SLP spoke with patient's family in room (spouse and son) who reported that of late, they were feeding him softer solid foods (hard boiled eggs, etc). They did acknowledge that he has a h/o GERD, however he does not take anything for this at home and has never been to a GI MD or treated for it. Son reported that when patient was admitted at the hospital previously for his hip, MD at that time did have him on reflux medication (SLP suspects this was likely Protonix and likely prophylactically). As patient's family will be ordering foods they know he can tolerate, SLP recommending regular texture solids, thin liquids. SLP and son reviewed hospital dining menu and discussed options, such as avoiding salads, raw vegetables, etc. SLP will plan to f/u at least one time to ensure diet toleration. SLP Visit Diagnosis: Dysphagia, unspecified (R13.10)    Aspiration Risk  No limitations;Mild aspiration risk    Diet  Recommendation Regular;Thin liquid   Medication Administration: Whole meds with puree Supervision: Patient able to self feed;Full supervision/cueing for compensatory strategies Compensations: Slow rate;Small sips/bites Postural Changes: Seated upright at 90 degrees    Other  Recommendations Oral Care Recommendations: Oral care  BID    Recommendations for follow up therapy are one component of a multi-disciplinary discharge planning process, led by the attending physician.  Recommendations may be updated based on patient status, additional functional criteria and insurance authorization.  Follow up Recommendations No SLP follow up      Assistance Recommended at Discharge    Functional Status Assessment Patient has had a recent decline in their functional status and demonstrates the ability to make significant improvements in function in a reasonable and predictable amount of time.  Frequency and Duration min 1 x/week  1 week       Prognosis Prognosis for improved oropharyngeal function: Good      Swallow Study   General Date of Onset: 06/12/22 HPI: Patient is an 82 y.o. male with PMH: chronic anemia, osteoarthritis of knees and back, cervical spinal stenosis, right LE cellulitis, DM-2 (insulin controlled), HTN, HLD, h/o falls, h/o right hip fracture, severe malnutrition and suspected GERD per SLP note from admission in October 2023. He was brought to ED from home on 06/12/22 due to AMS. UA showed glucosuria more than 500 and proteinuria 100 mg/dL. Left hand x-ray showed severe subluxation and dislocation of thumb and advanced osteoarthritis in hand. Portable CXR showed  new wedge-shaped area of consolidation in the periphery of the right lung base either in the right middle or lower lobes; CTA negative for PE but showed pulmonary consolidation in the right middle lobe and airspace disease in the right upper lobe highly suspicious for pneumonia. CT head negative for acute intracranial abnormalities but showed moderate cerebral and mild cerebellar atrophy with chronic microvascular ischemic changes in the cerebral white matter. He failed [facility] with RN on 06/12/22 and was kept NPO awaiting SLP evaluation of swallow. Type of Study: Bedside Swallow Evaluation Previous Swallow Assessment: during presvious admission, BSE in  October of 2023 Diet Prior to this Study: NPO Temperature Spikes Noted: No Respiratory Status: Room air History of Recent Intubation: No Behavior/Cognition: Alert;Cooperative;Pleasant mood Oral Cavity Assessment: Within Functional Limits Oral Care Completed by SLP: No Vision: Functional for self-feeding Self-Feeding Abilities: Able to feed self Patient Positioning: Upright in bed Baseline Vocal Quality: Low vocal intensity Volitional Cough: Weak Volitional Swallow: Unable to elicit    Oral/Motor/Sensory Function Overall Oral Motor/Sensory Function: Within functional limits   Ice Chips     Thin Liquid Thin Liquid: Impaired Presentation: Straw Pharyngeal  Phase Impairments: Cough - Delayed    Nectar Thick     Honey Thick     Puree Puree: Within functional limits Presentation: Spoon;Self Fed   Solid     Solid: Not tested      Sonia Baller, MA, CCC-SLP Speech Therapy

## 2022-06-13 NOTE — Progress Notes (Signed)
PROGRESS NOTE   Jason Mcpherson  [id number]    DOB: 06/12/1940    DOA: 06/12/2022  PCP: Doreatha Lew, MD   I have briefly reviewed patients previous medical records in [facility].  Chief Complaint  Patient presents with   Altered Mental Status    Brief Narrative:  82 year old married male, lives with his spouse and adult son, up to 2 weeks ago ambulated with the help of walker and since he got ill, transition to a wheelchair but in the last 5 days PTA has been bedbound, PMH of DM2, HTN, HLD, stage IIIb chronic kidney disease, hard of hearing, appears to have had sacral pressure ulcers at least since April 2023 followed by wound care OP, presented to the [facility] ED on 06/12/2022 due to altered mental status.  Workup revealed suspected aspiration pneumonia, polymicrobial bacteremia suspected due to large decubitus ulcers, acute septic metabolic encephalopathy and severe adult failure to thrive and decline.  ID and palliative team were consulted.  After multiple and extensive discussions with spouse and son at bedside regarding aggressive medical care versus transitioning to more comfort oriented care, they finally opted to transition to full comfort care.  TOC consulted for possible residential hospice.   Assessment & Plan:  Principal Problem:   Multifocal pneumonia Active Problems:   Essential hypertension, benign   Normocytic anemia   Gastroesophageal reflux disease   Pressure injury of left buttock, stage 3 (HCC)   Uncontrolled type 2 diabetes mellitus with hyperglycemia (HCC)   Mixed hyperlipidemia   Iron deficiency anemia   Constipation   Chronic kidney disease, stage 3b (HCC)   Adult failure to thrive, severe Multifactorial due to very advanced age, severe physical deconditioning and severe malnutrition, large nonhealing sacral decubitus ulcer, nonambulatory, current hospitalization with polymicrobial bacteremia.  Overall very poor prognosis.   Discussed in detail with patient's spouse and adult son at bedside regarding continued aggressive full scope of medical care with low likelihood of recovery to have a good quality of life and even if that happens, may be several months versus transitioning to comfort oriented care.  Answered all their questions.  They then voluntarily transitioned him to full comfort care, aware that this will likely hasten his demise, just wanted him to be comfortable and would like him to go to a hospice home.  All medications and labs nonessential to comfort were discontinued.  TOC consulted for residential hospice placement.  Aspiration RML, RLL pneumonia Polymicrobial bacteremia likely from the large nonhealing sacral decubitus ulcers, POA versus intra-abdominal source although he has no signs and symptoms. Stage III  multiple sacral decubitus ulcers (as noted in picture below), POA, nonhealing. Uncontrolled type II DM with hyperglycemia Essential hypertension Stage IIIb chronic kidney disease/non-anion gap metabolic acidosis: Creatinine worsened slightly likely related to CT contrast Mixed hyperlipidemia Anemia due to CKD/chronic disease/iron deficiency GERD   Body mass index is 24.03 kg/m.  Nutritional Status        Pressure Ulcer: Pressure Injury 01/31/22 Buttocks Right Deep Tissue Pressure Injury - Purple or maroon localized area of discolored intact skin or blood-filled blister due to damage of underlying soft tissue from pressure and/or shear. DTPI evolving to Unstageable (Active)  01/31/22 2030  Location: Buttocks  Location Orientation: Right  Staging: Deep Tissue Pressure Injury - Purple or maroon localized area of discolored intact skin or blood-filled blister due to damage of underlying soft tissue from pressure and/or shear.  Wound Description (Comments): DTPI evolving to Unstageable  Present on Admission: Yes  Dressing Type Gauze (Comment);Foam - Lift dressing to assess site every shift  06/13/22 0900     Pressure Injury 01/31/22 Coccyx Right;Left Deep Tissue Pressure Injury - Purple or maroon localized area of discolored intact skin or blood-filled blister due to damage of underlying soft tissue from pressure and/or shear. DTPI evolving to unstageable (Active)  01/31/22 2030  Location: Coccyx  Location Orientation: Right;Left  Staging: Deep Tissue Pressure Injury - Purple or maroon localized area of discolored intact skin or blood-filled blister due to damage of underlying soft tissue from pressure and/or shear.  Wound Description (Comments): DTPI evolving to unstageable  Present on Admission: Yes  Dressing Type Gauze (Comment);Foam - Lift dressing to assess site every shift 06/13/22 0900     Pressure Injury 06/12/22 Heel Left Deep Tissue Pressure Injury - Purple or maroon localized area of discolored intact skin or blood-filled blister due to damage of underlying soft tissue from pressure and/or shear. (Active)  06/12/22 1200  Location: Heel  Location Orientation: Left  Staging: Deep Tissue Pressure Injury - Purple or maroon localized area of discolored intact skin or blood-filled blister due to damage of underlying soft tissue from pressure and/or shear.  Wound Description (Comments):   Present on Admission: Yes  Dressing Type Foam - Lift dressing to assess site every shift 06/13/22 0900     Pressure Injury 06/12/22 Buttocks Left Unstageable - Full thickness tissue loss in which the base of the injury is covered by slough (yellow, tan, gray, green or brown) and/or eschar (tan, brown or black) in the wound bed. (Active)  06/12/22 1615  Location: Buttocks  Location Orientation: Left  Staging: Unstageable - Full thickness tissue loss in which the base of the injury is covered by slough (yellow, tan, gray, green or brown) and/or eschar (tan, brown or black) in the wound bed.  Wound Description (Comments):   Present on Admission: Yes  Dressing Type Foam - Lift dressing to  assess site every shift;Gauze (Comment) 06/13/22 0900     Pressure Injury 06/12/22 Coccyx Deep Tissue Pressure Injury - Purple or maroon localized area of discolored intact skin or blood-filled blister due to damage of underlying soft tissue from pressure and/or shear. (Active)  06/12/22 1617  Location: Coccyx  Location Orientation:   Staging: Deep Tissue Pressure Injury - Purple or maroon localized area of discolored intact skin or blood-filled blister due to damage of underlying soft tissue from pressure and/or shear.  Wound Description (Comments):   Present on Admission: Yes  Dressing Type Foam - Lift dressing to assess site every shift;Gauze (Comment) 06/13/22 0900     DVT prophylaxis:   None.  Full comfort care.   Code Status: DNR:  Family Communication: Extensive and multiple discussions with spouse and son at bedside Disposition:  Status is: Inpatient Remains inpatient appropriate because: Full comfort care awaiting residential hospice evaluation.     Consultants:   Infectious disease Palliative care medicine  Procedures:     Antimicrobials:   All antibiotics discontinued.   Subjective:  Patient extremely hard of hearing.  Alert and oriented to self and son, could not recognize his pulse and kept calling her by his mother's name.  As per family, mental status has improved but not back to baseline.  They say that he ate a small amount of breakfast.  As per nursing, sacral area pain  Objective:   Vitals:   06/12/22 1518 06/12/22 2104 06/13/22 0050 06/13/22 0506  BP: (!) 99/55 Marland Kitchen)  102/55 (!) 103/55 (!) 106/51  Pulse: 84 73 73 81  Resp: 18 20 20 18  $ Temp: 99.5 F (37.5 C) 97.8 F (36.6 C) 97.9 F (36.6 C) 97.8 F (36.6 C)  TempSrc: Oral  Oral Oral  SpO2: 97% 98% 98% 97%  Weight:      Height:        General exam: Elderly male, moderately built, poorly nourished, chronically ill looking, bitemporal muscle wasting, decreased subcutaneous fat especially in the  cheeks, lying supine in bed. Respiratory system: Clear to auscultation anteriorly.  Diminished breath sounds in the bases with occasional basal crackles.  No increased work of breathing. Cardiovascular system: S1 & S2 heard, RRR. No JVD, murmurs, rubs, gallops or clicks. No pedal edema.  Telemetry personally reviewed: Sinus rhythm, BBB morphology. Gastrointestinal system: Abdomen is nondistended, soft and nontender. No organomegaly or masses felt. Normal bowel sounds heard. Central nervous system: Alert and oriented to self and son. No focal neurological deficits.  Extremely hard of hearing, has broken to hearing aids and has none at home. Extremities: Symmetric 5 x 5 power. Skin: Multiple large sacral decubitus ulcers as noted in picture below taken on admission Psychiatry: Judgement and insight impaired. Mood & affect cannot be assessed or flat    Data Reviewed:   I have personally reviewed following labs and imaging studies   CBC: Recent Labs  Lab 06/12/22 0519 06/13/22 0608  WBC 19.8* 18.7*  NEUTROABS 18.6*  --   HGB 8.2* 7.3*  HCT 28.0* 24.2*  MCV 89.2 86.7  PLT 518* [id number]    Basic Metabolic Panel: Recent Labs  Lab 06/12/22 0519 06/13/22 0443  NA 133* 136  K 4.8 4.6  CL 113* 115*  CO2 11* 11*  GLUCOSE 422* 89  BUN 113* 108*  CREATININE 1.94* 2.30*  CALCIUM 8.1* 8.2*    Liver Function Tests: Recent Labs  Lab 06/12/22 0519 06/13/22 0443  AST 13* 12*  ALT 12 12  ALKPHOS 79 64  BILITOT 0.4 0.3  PROT 6.2* 5.2*  ALBUMIN 1.8* 1.6*    CBG: Recent Labs  Lab 06/12/22 1643 06/12/22 2103 06/13/22 0743  GLUCAP 224* 123* 102*    Microbiology Studies:   Recent Results (from the past 240 hour(s))  Culture, blood (Routine X 2) w Reflex to ID Panel     Status: None (Preliminary result)   Collection Time: 06/12/22  7:02 AM   Specimen: Right Antecubital; Blood  Result Value Ref Range Status   Specimen Description RIGHT ANTECUBITAL BLOOD  Final   Special  Requests   Final    Blood Culture adequate volume BOTTLES DRAWN AEROBIC AND ANAEROBIC   Culture  Setup Time   Final    IN BOTH AEROBIC AND ANAEROBIC BOTTLES GRAM NEGATIVE RODS GRAM POSITIVE COCCI CRITICAL RESULT CALLED TO, READ BACK BY AND VERIFIED WITH: PHARMD E. JACKSON 06/12/22 @ 2309 BY AB    Culture   Final    NO GROWTH < 24 HOURS Performed at [facility], [place] [address] [place], [place] [address]    Report Status PENDING  Incomplete  Blood Culture ID Panel (Reflexed)     Status: Abnormal   Collection Time: 06/12/22  7:02 AM  Result Value Ref Range Status   Enterococcus faecalis DETECTED (A) NOT DETECTED Final    Comment: CRITICAL RESULT CALLED TO, READ BACK BY AND VERIFIED WITH: PHARMD E. JACKSON 06/12/22 @ 2309 BY AB    Enterococcus Faecium NOT DETECTED NOT DETECTED Final  Listeria monocytogenes NOT DETECTED NOT DETECTED Final   Staphylococcus species DETECTED (A) NOT DETECTED Final    Comment: CRITICAL RESULT CALLED TO, READ BACK BY AND VERIFIED WITH: PHARMD E. JACKSON 06/12/22 @ 2309 BY AB    Staphylococcus aureus (BCID) DETECTED (A) NOT DETECTED Final    Comment: Methicillin (oxacillin) susceptible Staphylococcus aureus (MSSA). Preferred therapy is anti staphylococcal beta lactam antibiotic (Cefazolin or Nafcillin), unless clinically contraindicated. CRITICAL RESULT CALLED TO, READ BACK BY AND VERIFIED WITH: PHARMD E. JACKSON 06/12/22 @ 2309 BY AB    Staphylococcus epidermidis DETECTED (A) NOT DETECTED Final    Comment: CRITICAL RESULT CALLED TO, READ BACK BY AND VERIFIED WITH: PHARMD E. JACKSON 06/12/22 @ 2309 BY AB    Staphylococcus lugdunensis NOT DETECTED NOT DETECTED Final   Streptococcus species DETECTED (A) NOT DETECTED Final    Comment: Not Enterococcus species, Streptococcus agalactiae, Streptococcus pyogenes, or Streptococcus pneumoniae. CRITICAL RESULT CALLED TO, READ BACK BY AND VERIFIED WITH: PHARMD E. JACKSON 06/12/22 @ 2309 BY AB    Streptococcus  agalactiae NOT DETECTED NOT DETECTED Final   Streptococcus pneumoniae NOT DETECTED NOT DETECTED Final   Streptococcus pyogenes NOT DETECTED NOT DETECTED Final   A.calcoaceticus-baumannii NOT DETECTED NOT DETECTED Final   Bacteroides fragilis NOT DETECTED NOT DETECTED Final   Enterobacterales DETECTED (A) NOT DETECTED Final    Comment: Enterobacterales represent a large order of gram negative bacteria, not a single organism. CRITICAL RESULT CALLED TO, READ BACK BY AND VERIFIED WITH: PHARMD E. JACKSON 06/12/22 @ 2309 BY AB    Enterobacter cloacae complex NOT DETECTED NOT DETECTED Final   Escherichia coli NOT DETECTED NOT DETECTED Final   Klebsiella aerogenes NOT DETECTED NOT DETECTED Final   Klebsiella oxytoca NOT DETECTED NOT DETECTED Final   Klebsiella pneumoniae NOT DETECTED NOT DETECTED Final   Proteus species DETECTED (A) NOT DETECTED Final    Comment: CRITICAL RESULT CALLED TO, READ BACK BY AND VERIFIED WITH: PHARMD E. JACKSON 06/12/22 @ 2309 BY AB    Salmonella species NOT DETECTED NOT DETECTED Final   Serratia marcescens NOT DETECTED NOT DETECTED Final   Haemophilus influenzae NOT DETECTED NOT DETECTED Final   Neisseria meningitidis NOT DETECTED NOT DETECTED Final   Pseudomonas aeruginosa NOT DETECTED NOT DETECTED Final   Stenotrophomonas maltophilia NOT DETECTED NOT DETECTED Final   Candida albicans NOT DETECTED NOT DETECTED Final   Candida auris NOT DETECTED NOT DETECTED Final   Candida glabrata NOT DETECTED NOT DETECTED Final   Candida krusei NOT DETECTED NOT DETECTED Final   Candida parapsilosis NOT DETECTED NOT DETECTED Final   Candida tropicalis NOT DETECTED NOT DETECTED Final   Cryptococcus neoformans/gattii NOT DETECTED NOT DETECTED Final   CTX-M ESBL NOT DETECTED NOT DETECTED Final   Carbapenem resistance IMP NOT DETECTED NOT DETECTED Final   Carbapenem resistance KPC NOT DETECTED NOT DETECTED Final   Methicillin resistance mecA/C NOT DETECTED NOT DETECTED Final    Meth resistant mecA/C and MREJ NOT DETECTED NOT DETECTED Final   Carbapenem resistance NDM NOT DETECTED NOT DETECTED Final   Carbapenem resist OXA 48 LIKE NOT DETECTED NOT DETECTED Final   Vancomycin resistance NOT DETECTED NOT DETECTED Final   Carbapenem resistance VIM NOT DETECTED NOT DETECTED Final    Comment: Performed at [facility], [address] [place], [place] [address]  Culture, blood (Routine X 2) w Reflex to ID Panel     Status: None (Preliminary result)   Collection Time: 06/12/22  7:23 AM   Specimen: Left  Antecubital; Blood  Result Value Ref Range Status   Specimen Description LEFT ANTECUBITAL BLOOD  Final   Special Requests   Final    Blood Culture adequate volume BOTTLES DRAWN AEROBIC AND ANAEROBIC   Culture  Setup Time   Final    GRAM NEGATIVE RODS GRAM VARIABLE ROD ANAEROBIC BOTTLE ONLY GRAM NEGATIVE RODS AEROBIC BOTTLE ONLY Gram Stain Report Called to,Read Back By and Verified With: [place] 06/12/22 @ 2309 BY AB    Culture   Final    CULTURE REINCUBATED FOR BETTER GROWTH Performed at [facility], [place] [address], [place], [place] [address]    Report Status PENDING  Incomplete    Radiology Studies:  CT Angio Chest Pulmonary Embolism (PE) W or WO Contrast  Result Date: 06/12/2022 CLINICAL DATA:  Altered mental status. Abnormal masslike opacity on chest radiograph. Clinical suspicion for pulmonary infarct and embolism. EXAM: CT ANGIOGRAPHY CHEST WITH CONTRAST TECHNIQUE: Multidetector CT imaging of the chest was performed using the standard protocol during bolus administration of intravenous contrast. Multiplanar CT image reconstructions and MIPs were obtained to evaluate the vascular anatomy. RADIATION DOSE REDUCTION: This exam was performed according to the departmental dose-optimization program which includes automated exposure control, adjustment of the mA and/or kV according to patient size and/or use of iterative reconstruction technique.  CONTRAST:  71m OMNIPAQUE IOHEXOL 350 MG/ML SOLN COMPARISON:  None Available. FINDINGS: Cardiovascular: Satisfactory opacification of pulmonary arteries noted, and no pulmonary emboli identified. No evidence of thoracic aortic dissection or aneurysm. Aortic and coronary atherosclerotic calcification incidentally noted. Mediastinum/Nodes: No masses or pathologically enlarged lymph nodes identified. Lungs/Pleura: Pulmonary consolidation is seen in the right middle lobe, with less severe airspace disease in the peripheral right upper lobe highly suspicious for pneumonia. Mild centrilobular emphysema noted. No evidence of pleural effusion. Upper abdomen: No acute findings. Musculoskeletal: No suspicious bone lesions identified. Review of the MIP images confirms the above findings. IMPRESSION: No evidence of pulmonary embolism. Pulmonary consolidation in right middle lobe and airspace disease in right upper lobe, highly suspicious for pneumonia. Aortic Atherosclerosis (ICD10-I70.0) and Emphysema (ICD10-J43.9). Electronically Signed   By: JMarlaine HindM.D.   On: 06/12/2022 09:24   CT HEAD WO CONTRAST (5MM)  Result Date: 06/12/2022 CLINICAL DATA:  82year old male with history of altered mental status. EXAM: CT HEAD WITHOUT CONTRAST TECHNIQUE: Contiguous axial images were obtained from the base of the skull through the vertex without intravenous contrast. RADIATION DOSE REDUCTION: This exam was performed according to the departmental dose-optimization program which includes automated exposure control, adjustment of the mA and/or kV according to patient size and/or use of iterative reconstruction technique. COMPARISON:  No priors. FINDINGS: Brain: Moderate cerebral and mild cerebellar atrophy. Ex vacuo dilatation of the ventricular system. Patchy and confluent areas of decreased attenuation are noted throughout the deep and periventricular white matter of the cerebral hemispheres bilaterally, compatible with chronic  microvascular ischemic disease. No evidence of acute infarction, hemorrhage, hydrocephalus, extra-axial collection or mass lesion/mass effect. Vascular: No hyperdense vessel or unexpected calcification. Skull: Normal. Negative for fracture or focal lesion. Sinuses/Orbits: No acute finding. Other: None. IMPRESSION: 1. No acute intracranial abnormalities. 2. Moderate cerebral and mild cerebellar atrophy with chronic microvascular ischemic changes in the cerebral white matter, as above. Electronically Signed   By: DVinnie LangtonM.D.   On: 06/12/2022 06:34   DG Chest Port 1 View  Result Date: 06/12/2022 CLINICAL DATA:  82year old male with history of altered mental status. EXAM: PORTABLE CHEST 1  VIEW COMPARISON:  Chest x-ray 02/26/2022. FINDINGS: New airspace consolidation in the periphery of the right lung near the base, somewhat wedge-shaped in appearance. Left lung is clear. No definite pleural effusions. No pneumothorax. No evidence of pulmonary edema. Heart size is normal. The patient is rotated to the right on today's exam, resulting in distortion of the mediastinal contours and reduced diagnostic sensitivity and specificity for mediastinal pathology. Atherosclerotic calcifications in the thoracic aorta. IMPRESSION: 1. New wedge-shaped area of consolidation in the periphery of the right lung base either in the right middle or lower lobes. In the appropriate clinical setting, this may simply represent a pneumonia. Alternatively, the possibility of alveolar hemorrhage from pulmonary embolism should be considered. Followup PA and lateral chest X-ray is recommended in 3-4 weeks following trial of antibiotic therapy to ensure resolution and exclude underlying malignancy. If pulmonary embolism is of concern, further evaluation with PE protocol CT scan should be obtained at this time. 2. Aortic atherosclerosis. Electronically Signed   By: Vinnie Langton M.D.   On: 06/12/2022 06:32   DG Hand Complete  Left  Result Date: 06/12/2022 CLINICAL DATA:  82 year old male with history of left hand swelling. Altered mental status. EXAM: LEFT HAND - COMPLETE 3+ VIEW COMPARISON:  No priors. FINDINGS: Abnormal angulation of the thumb at the first metacarpal phalangeal joint, suggesting severe subluxation or dislocation, although poorly evaluated on today's nonstandard images. No acute displaced fracture. Severe multifocal joint space narrowing, subchondral sclerosis, subchondral cyst formation and osteophyte formation noted throughout the hand and wrist, most severe at the D IP and PIP joints, indicative of advanced osteoarthritis. IMPRESSION: 1. Severe subluxation or dislocation of the thumb at the first MCP joint. 2. Advanced osteoarthritis in the hand, as above. Electronically Signed   By: Vinnie Langton M.D.   On: 06/12/2022 06:30    Scheduled Meds:    mouth rinse  15 mL Mouth Rinse 4 times per day   mouth rinse  15 mL Mouth Rinse 4 times per day    Continuous Infusions:     LOS: 1 day     Vernell Leep, MD,  FACP, FHM, SFHM, [facility], [place]     To contact the attending provider between 7A-7P or the covering provider during after hours 7P-7A, please log into the web site [email or web address] and access using universal [place] password for that web site. If you do not have the password, please call the hospital operator.  06/13/2022, 1:43 PM

## 2022-06-13 NOTE — Progress Notes (Signed)
Manufacturing engineer [facility]) Hospital Liaison Note  Received request from [employer] [facility] for family interest in [facility]. [facility] to begin evaluation process on 2.12. Spouse notified via confidential VM.   Please do not hesitate to call with any hospice related questions.    Thank you for the opportunity to participate in this patient's care.  Phillis Haggis, MSW [facility] Liaison  [phone]

## 2022-06-13 NOTE — Consult Note (Signed)
Date of Admission:  06/12/2022          Reason for Consult: Polymicrobial bacteremia    Referring Provider: CHAMP auto consult and Vernell Leep, MD   Assessment:  Polymicrobial bacteremia likely  from decubitus ulcers vs intrabdominal source  RML, RLL pneumonia Progressive neurological decline with likely underlying dementia that is worsened ever since he broke his femur and required IM nail Hx of Cervical spine surgery  Plan:  Continue zosyn for now and DC the vancomycin Palliative to see the patient and the wife has told me that she does NOT want to prolong his life and will likely want to DC to antibiotics altogether     Principal Problem:   Multifocal pneumonia Active Problems:   Essential hypertension, benign   Normocytic anemia   Gastroesophageal reflux disease   Pressure injury of left buttock, stage 3 (HCC)   Uncontrolled type 2 diabetes mellitus with hyperglycemia (HCC)   Mixed hyperlipidemia   Iron deficiency anemia   Constipation   Chronic kidney disease, stage 3b (HCC)   Scheduled Meds:  apixaban  5 mg Oral BID   aspirin EC  81 mg Oral Daily   famotidine  20 mg Oral QHS   ferrous sulfate  325 mg Oral q morning   insulin aspart  0-9 Units Subcutaneous TID WC   mouth rinse  15 mL Mouth Rinse 4 times per day   mouth rinse  15 mL Mouth Rinse 4 times per day   Ensure Max Protein  11 oz Oral BID   simvastatin  20 mg Oral QPM   Continuous Infusions:  piperacillin-tazobactam (ZOSYN)  IV 3.375 g (06/13/22 0839)   sodium bicarbonate 150 mEq in sterile water 1,150 mL infusion 100 mL/hr at 06/13/22 1038   PRN Meds:.acetaminophen **OR** acetaminophen, bisacodyl, docusate sodium, ondansetron **OR** ondansetron (ZOFRAN) IV, mouth rinse, mouth rinse, polyethylene glycol, traMADol  HPI: Jason Mcpherson is a 82 y.o. male with past medical history significant for cervical spine stenosis diabetes mellitus hypertension hyperlipidemia who sustained a hip  fracture last year requiring IM nail.  Since then he has continued to have progressive cognitive decline.  He has been residing at home where his wife and son have tried to do the best to take care of him dress and clean him and feed him.  Unfortunately has developed decubitus ulcers due to his inability to ambulate and the fact that he has been largely confined to wheelchair or bed.  His wife and son have tried to turn him and have him on his side is much as possible but he has had deterioration of his decubitus ulcers.  This worsened in the last week or 2 and the patient's son sought advice with regards to treatment of the wounds.  In the interim he had progressive confusion and was ultimately brought to the hospital.  He is been quite confused and not able to answer many questions at all.  Blood cultures were taken on arrival to the ER and now are showing via the [facility] ID  Enterococcus faecalis a Staphylococcus aureus that is not methicillin resistant, a Staphylococcus epidermidis and a streptococcal species as well as a Proteus species.  Initially been on vancomycin cefepime and metronidazole.  I DC'd cefepime metronidazole and placed him on Zosyn.  Pharmacy are rightly concerned about the risk of nephrotoxicity with vancomycin and Zosyn together.  I think we can dispense with the vancomycin for now.  On the [facility]  ID he is only does not have MRSA.  The Staphylococcus epidermidis may or may not be methicillin resistant--but even if it is I am skeptical that is a true pathogen and it could be a contaminant  Patient had a CT angiogram on arrival that shows consolidation in the right middle and right lower lobes concerning for pneumonia and no pulmonary embolism.  He does have a little bit of tenderness and his abdominal exam.  I suspect the source of his polymicrobial bacteremia = decubitus ulcers.  1 could contemplate CT abdomen pelvis to look for intra-abdominal source too.  But as  mentioned before the family do not want aggressive measures so would not pursue any of these imaging modalities  Regardless, the patient and son have made it clear to me that they do not want to prolong Jason Mcpherson's life and they want him to be comfortable.  Based on my discussions with them they would like to stop antibiotics but I counseled them to first meet with palliative care before making that decision though I am fully supportive of pivoting to pure comfort measures.  I spent 82 minutes with the patient including than 50% of the time in face to face and non-face-to-face time the patient is wife and son with regards to his polymicrobial bacteremia pneumonia potential intra-abdominal infection decubitus ulcers progressive cognitive decline goals of care personally reviewing ET head CT chest angiogram along with review of medical records in preparation for the visit and during the visit and in coordination of nis care.    Review of Systems: Review of Systems  Unable to perform ROS: Dementia    Past Medical History:  Diagnosis Date   Anemia    Arthritis    KNEES & BACK   Cellulitis 08/2013   RT LEG   Chronic kidney disease 08/2013   ACUTE RENAL    Dehydration    Diabetes mellitus without complication ([place])    Hyperkalemia 08/2013   Hyperlipemia    Hypertension     Social History   Tobacco Use   Smoking status: Former    Types: Cigarettes    Quit date: 10/12/1997    Years since quitting: 24.6   Smokeless tobacco: Never  Substance Use Topics   Alcohol use: No   Drug use: No    Family History  Problem Relation Age of Onset   Cancer - Colon Mother    Cancer - Prostate Father    Allergies  Allergen Reactions   Amlodipine Swelling    ANKLE EDEMA    OBJECTIVE: Blood pressure (!) 106/51, pulse 81, temperature 97.8 F (36.6 C), temperature source Oral, resp. rate 18, height 5' 8"$  (1.727 m), weight 71.7 kg, SpO2 97 %.  Physical Exam Constitutional:      General: He is  not in acute distress. HENT:     Head: Normocephalic and atraumatic.  Cardiovascular:     Rate and Rhythm: Normal rate.  Pulmonary:     Effort: Pulmonary effort is normal. No respiratory distress.  Abdominal:     General: There is no distension.     Tenderness: There is abdominal tenderness.  Musculoskeletal:        General: Normal range of motion.  Skin:    General: Skin is warm and dry.     Coloration: Skin is pale.  Neurological:     General: No focal deficit present.     Mental Status: He is alert. He is disoriented.  Psychiatric:  Cognition and Memory: Cognition is impaired. Memory is impaired. He exhibits impaired recent memory and impaired remote memory.     Lab Results Lab Results  Component Value Date   WBC 18.7 (H) 06/13/2022   HGB 7.3 (L) 06/13/2022   HCT 24.2 (L) 06/13/2022   MCV 86.7 06/13/2022   PLT 304 06/13/2022    Lab Results  Component Value Date   CREATININE 2.30 (H) 06/13/2022   BUN 108 (H) 06/13/2022   NA 136 06/13/2022   K 4.6 06/13/2022   CL 115 (H) 06/13/2022   CO2 11 (L) 06/13/2022    Lab Results  Component Value Date   ALT 12 06/13/2022   AST 12 (L) 06/13/2022   ALKPHOS 64 06/13/2022   BILITOT 0.3 06/13/2022     Microbiology: Recent Results (from the past 240 hour(s))  Culture, blood (Routine X 2) w Reflex to ID Panel     Status: None (Preliminary result)   Collection Time: 06/12/22  7:02 AM   Specimen: Right Antecubital; Blood  Result Value Ref Range Status   Specimen Description RIGHT ANTECUBITAL BLOOD  Final   Special Requests   Final    Blood Culture adequate volume BOTTLES DRAWN AEROBIC AND ANAEROBIC   Culture  Setup Time   Final    IN BOTH AEROBIC AND ANAEROBIC BOTTLES GRAM NEGATIVE RODS GRAM POSITIVE COCCI CRITICAL RESULT CALLED TO, READ BACK BY AND VERIFIED WITH: PHARMD E. JACKSON 06/12/22 @ 2309 BY AB    Culture   Final    NO GROWTH < 24 HOURS Performed at [facility], [place] [address] [place],  [place] [address]    Report Status PENDING  Incomplete  Blood Culture ID Panel (Reflexed)     Status: Abnormal   Collection Time: 06/12/22  7:02 AM  Result Value Ref Range Status   Enterococcus faecalis DETECTED (A) NOT DETECTED Final    Comment: CRITICAL RESULT CALLED TO, READ BACK BY AND VERIFIED WITH: PHARMD E. JACKSON 06/12/22 @ 2309 BY AB    Enterococcus Faecium NOT DETECTED NOT DETECTED Final   Listeria monocytogenes NOT DETECTED NOT DETECTED Final   Staphylococcus species DETECTED (A) NOT DETECTED Final    Comment: CRITICAL RESULT CALLED TO, READ BACK BY AND VERIFIED WITH: PHARMD E. JACKSON 06/12/22 @ 2309 BY AB    Staphylococcus aureus (BCID) DETECTED (A) NOT DETECTED Final    Comment: Methicillin (oxacillin) susceptible Staphylococcus aureus (MSSA). Preferred therapy is anti staphylococcal beta lactam antibiotic (Cefazolin or Nafcillin), unless clinically contraindicated. CRITICAL RESULT CALLED TO, READ BACK BY AND VERIFIED WITH: PHARMD E. JACKSON 06/12/22 @ 2309 BY AB    Staphylococcus epidermidis DETECTED (A) NOT DETECTED Final    Comment: CRITICAL RESULT CALLED TO, READ BACK BY AND VERIFIED WITH: PHARMD E. JACKSON 06/12/22 @ 2309 BY AB    Staphylococcus lugdunensis NOT DETECTED NOT DETECTED Final   Streptococcus species DETECTED (A) NOT DETECTED Final    Comment: Not Enterococcus species, Streptococcus agalactiae, Streptococcus pyogenes, or Streptococcus pneumoniae. CRITICAL RESULT CALLED TO, READ BACK BY AND VERIFIED WITH: PHARMD E. JACKSON 06/12/22 @ 2309 BY AB    Streptococcus agalactiae NOT DETECTED NOT DETECTED Final   Streptococcus pneumoniae NOT DETECTED NOT DETECTED Final   Streptococcus pyogenes NOT DETECTED NOT DETECTED Final   A.calcoaceticus-baumannii NOT DETECTED NOT DETECTED Final   Bacteroides fragilis NOT DETECTED NOT DETECTED Final   Enterobacterales DETECTED (A) NOT DETECTED Final    Comment: Enterobacterales represent a large order of gram negative bacteria, not  a single organism. CRITICAL RESULT CALLED TO, READ BACK BY AND VERIFIED WITH: PHARMD E. JACKSON 06/12/22 @ 2309 BY AB    Enterobacter cloacae complex NOT DETECTED NOT DETECTED Final   Escherichia coli NOT DETECTED NOT DETECTED Final   Klebsiella aerogenes NOT DETECTED NOT DETECTED Final   Klebsiella oxytoca NOT DETECTED NOT DETECTED Final   Klebsiella pneumoniae NOT DETECTED NOT DETECTED Final   Proteus species DETECTED (A) NOT DETECTED Final    Comment: CRITICAL RESULT CALLED TO, READ BACK BY AND VERIFIED WITH: PHARMD E. JACKSON 06/12/22 @ 2309 BY AB    Salmonella species NOT DETECTED NOT DETECTED Final   Serratia marcescens NOT DETECTED NOT DETECTED Final   Haemophilus influenzae NOT DETECTED NOT DETECTED Final   Neisseria meningitidis NOT DETECTED NOT DETECTED Final   Pseudomonas aeruginosa NOT DETECTED NOT DETECTED Final   Stenotrophomonas maltophilia NOT DETECTED NOT DETECTED Final   Candida albicans NOT DETECTED NOT DETECTED Final   Candida auris NOT DETECTED NOT DETECTED Final   Candida glabrata NOT DETECTED NOT DETECTED Final   Candida krusei NOT DETECTED NOT DETECTED Final   Candida parapsilosis NOT DETECTED NOT DETECTED Final   Candida tropicalis NOT DETECTED NOT DETECTED Final   Cryptococcus neoformans/gattii NOT DETECTED NOT DETECTED Final   CTX-M ESBL NOT DETECTED NOT DETECTED Final   Carbapenem resistance IMP NOT DETECTED NOT DETECTED Final   Carbapenem resistance KPC NOT DETECTED NOT DETECTED Final   Methicillin resistance mecA/C NOT DETECTED NOT DETECTED Final   Meth resistant mecA/C and MREJ NOT DETECTED NOT DETECTED Final   Carbapenem resistance NDM NOT DETECTED NOT DETECTED Final   Carbapenem resist OXA 48 LIKE NOT DETECTED NOT DETECTED Final   Vancomycin resistance NOT DETECTED NOT DETECTED Final   Carbapenem resistance VIM NOT DETECTED NOT DETECTED Final    Comment: Performed at [facility], [address], [place], [place] [address]  Culture, blood  (Routine X 2) w Reflex to ID Panel     Status: None (Preliminary result)   Collection Time: 06/12/22  7:23 AM   Specimen: Left Antecubital; Blood  Result Value Ref Range Status   Specimen Description LEFT ANTECUBITAL BLOOD  Final   Special Requests   Final    Blood Culture adequate volume BOTTLES DRAWN AEROBIC AND ANAEROBIC   Culture  Setup Time   Final    GRAM NEGATIVE RODS GRAM VARIABLE ROD ANAEROBIC BOTTLE ONLY GRAM NEGATIVE RODS AEROBIC BOTTLE ONLY Gram Stain Report Called to,Read Back By and Verified With: [place] 06/12/22 @ 2309 BY AB    Culture   Final    CULTURE REINCUBATED FOR BETTER GROWTH Performed at [facility], [place] [address] [place], [place] [address]    Report Status PENDING  Incomplete    Alcide Evener, [facility] [phone] pager  06/13/2022, 11:41 AM

## 2022-06-13 NOTE — TOC Initial Note (Signed)
Transition of Care [facility]) - Initial/Assessment Note    Patient Details  Name: Jason Mcpherson MRN: [id number] Date of Birth: [birth year]  Transition [facility] Care [facility]) CM/SW Contact:    Henrietta Dine, RN Phone Number: 06/13/2022, 3:37 PM  Clinical Narrative:                 [facility] consult for d/c needs and residential hospice; spoke w/ pt's wife/POC Selinda Eon [phone]) and son in room;she says pt is from home but pt expects pt expire in hospital; she says pt has glasses, bilateral HA and dentures (upper/lower); she says pt has walker, wheel chair, hospital bed, lift recliner, and BSC; there are no bars in shower; Mrs Aster says pt was last seen by [facility] on Thursday; Katina at agency notified of pt's location.  -1610- family no longer in room; called pt's wife and son Randall Hiss to discuss residential hospice they agrees to receiving residential  info; they chose [facility] and [facility]; list of residential hospice facilities also left on window seat room; spoke w/ Tharon Aquas at [facility] and they do not have beds available; notified Shania Junious at [facility] and they will evaluate pt tomorrow.  Expected Discharge Plan: Expired     Patient Goals and CMS Choice Patient states their goals for this hospitalization and ongoing recovery are:: per wife pt will expire in hospital          Expected Discharge Plan and Services   Discharge Planning Services: CM Consult   Living arrangements for the past 2 months: Single Family Home                                      Prior Living Arrangements/Services Living arrangements for the past 2 months: Single Family Home Lives with:: Spouse Patient language and need for interpreter reviewed:: Yes        Need for Family Participation in Patient Care: Yes (Comment) Care giver support system in place?: Yes (comment) Current home services: DME (walker, hospital bed, lift recliner, BSC) Criminal  Activity/Legal Involvement Pertinent to Current Situation/Hospitalization: No - Comment as needed  Activities of Daily Living Home Assistive Devices/Equipment: Wheelchair, Environmental consultant (specify type), Hospital bed ADL Screening (condition at time of admission) Patient's cognitive ability adequate to safely complete daily activities?: No Is the patient deaf or have difficulty hearing?: Yes Does the patient have difficulty seeing, even when wearing glasses/contacts?: No Does the patient have difficulty concentrating, remembering, or making decisions?: Yes Patient able to express need for assistance with ADLs?: No Does the patient have difficulty dressing or bathing?: Yes Independently performs ADLs?: No Communication: Independent Dressing (OT): Needs assistance Is this a change from baseline?: Pre-admission baseline Grooming: Needs assistance Is this a change from baseline?: Pre-admission baseline Feeding: Needs assistance Is this a change from baseline?: Pre-admission baseline Bathing: Needs assistance Is this a change from baseline?: Pre-admission baseline Toileting: Needs assistance Is this a change from baseline?: Pre-admission baseline In/Out Bed: Needs assistance Is this a change from baseline?: Pre-admission baseline Walks in Home: Needs assistance Is this a change from baseline?: Pre-admission baseline Does the patient have difficulty walking or climbing stairs?: Yes Weakness of Legs: Both Weakness of Arms/Hands: Both  Permission Sought/Granted Permission sought to share information with : Case Manager Permission granted to share information with : Yes, Verbal Permission Granted  Share Information with NAME: Lenor Coffin, RN, CM  Permission granted to share info w Relationship: Debron Fritch (spouse) [phone]     Emotional Assessment Appearance:: Appears stated age Attitude/Demeanor/Rapport: Unable to Assess Affect (typically observed): Unable to Assess Orientation: :   (uable to assess; pt asleep) Alcohol / Substance Use: Not Applicable Psych Involvement: No (comment)  Admission diagnosis:  Altered mental status, unspecified altered mental status type [R41.82] Multifocal pneumonia [J18.9] Pneumonia of right lung due to infectious organism, unspecified part of lung [J18.9] Pressure injury of skin of contiguous region involving back, buttock, and hip, unspecified injury stage, unspecified laterality [L89.40] Patient Active Problem List   Diagnosis Date Noted   Multifocal pneumonia 06/12/2022   Iron deficiency anemia 06/12/2022   Diverticular disease of colon 06/12/2022   Decreased activities of daily living (ADL) 06/12/2022   Constipation 06/12/2022   Chronic kidney disease, stage 3b ([place]) 06/12/2022   Malnutrition of moderate degree 02/05/2022   Pressure injury of skin 02/03/2022   Closed right hip fracture ([place]) 01/30/2022   Normocytic anemia 01/30/2022   Stage 3a chronic kidney disease (CKD) ([place]) 01/30/2022   Fall at home, initial encounter 01/30/2022   Persistent proteinuria 12/29/2021   Vitamin D deficiency 12/29/2021   Pressure injury of left buttock, stage 3 ([place]) 08/31/2021   Gastroesophageal reflux disease 10/08/2020   Uncontrolled type 2 diabetes mellitus with hyperglycemia ([place]) 10/08/2020   Mixed hyperlipidemia 10/08/2020   Pure hypercholesterolemia 12/16/2014   Type II or unspecified type diabetes mellitus without mention of complication, uncontrolled 10/12/2013   Essential hypertension, benign 10/12/2013   Bacteremia 08/25/2013   Cellulitis 08/17/2013   DM2 (diabetes mellitus, type 2) ([place]) 08/17/2013   HTN (hypertension) 08/17/2013   Dehydration 08/17/2013   Nausea with vomiting 08/17/2013   Hyperkalemia 08/17/2013   Spinal stenosis in cervical region 10/17/2012   Pain in joint involving lower leg 10/07/2012   PCP:  Doreatha Lew, MD Pharmacy:   [id number] [place], [place] - [id number] [id number]: [phone]: [phone]    Social Determinants of Health (SDOH) Social History: SDOH Screenings   Food Insecurity: No Food Insecurity (06/13/2022)  Housing: Low Risk  (06/13/2022)  Transportation Needs: No Transportation Needs (06/13/2022)  Utilities: Not At Risk (06/13/2022)  Tobacco Use: Medium Risk (06/12/2022)   SDOH Interventions: Food Insecurity Interventions: Inpatient TOC Housing Interventions: Inpatient TOC Transportation Interventions: Inpatient TOC Utilities Interventions: Inpatient TOC   Readmission Risk Interventions    06/13/2022    2:40 PM 01/31/2022    2:47 PM  Readmission Risk Prevention Plan  Transportation Screening Complete Complete  PCP or Specialist Appt within 5-7 Days  Complete  PCP or Specialist Appt within 3-5 Days Complete   Home Care Screening  Complete  Medication Review (RN CM)  Complete  HRI or Home Care Consult Complete   Social Work Consult for [place]/Counseling Complete   Palliative Care Screening Complete   Medication Review (Press photographer Complete

## 2022-06-14 DIAGNOSIS — J189 Pneumonia, unspecified organism: Secondary | ICD-10-CM | POA: Diagnosis not present

## 2022-06-14 MED ORDER — SCOPOLAMINE 1 MG/3DAYS TD PT72
1.0000 | MEDICATED_PATCH | TRANSDERMAL | Status: DC
  Administered 2022-06-14: 1.5 mg via TRANSDERMAL
  Filled 2022-06-14: qty 1

## 2022-06-14 MED ORDER — HYDROMORPHONE HCL 1 MG/ML IJ SOLN: 0.5000 mg | INTRAMUSCULAR | Status: AC | PRN

## 2022-06-14 MED ORDER — SCOPOLAMINE 1 MG/3DAYS TD PT72: 1.0000 | MEDICATED_PATCH | TRANSDERMAL | Status: AC

## 2022-06-14 MED ORDER — POLYVINYL ALCOHOL 1.4 % OP SOLN: 1.0000 [drp] | Freq: Four times a day (QID) | OPHTHALMIC | Status: AC | PRN

## 2022-06-14 MED ORDER — ACETAMINOPHEN 325 MG PO TABS: 650.0000 mg | ORAL_TABLET | Freq: Four times a day (QID) | ORAL | Status: AC | PRN

## 2022-06-14 MED ORDER — OXYCODONE HCL 5 MG PO TABS: 5.0000 mg | ORAL_TABLET | ORAL | Status: AC | PRN

## 2022-06-14 MED ORDER — LORAZEPAM 2 MG/ML PO CONC: 1.0000 mg | Freq: Four times a day (QID) | ORAL | Status: AC | PRN

## 2022-06-14 NOTE — Consult Note (Signed)
WOC Nurse Consult Note: Reason for Consult: multiple pressure injuries; malnourished, bed bound Apparent skin failure with plans for residential hospice if patient survives inpatient stay.  Wound type: extensive skin breakdown 6 Unstageable pressure injuries; coccyx, sacrum; right and left posterior iliac crest, left heel  Pressure Injury POA: Yes Measurement: see nursing flowsheets Wound bed: wounds are 100% necrotic/non viable tissue, except for sacrum-50% pink/50% eschar Drainage (amount, consistency, odor) see nursing flow sheets Periwound: intact  Dressing procedure/placement/frequency: Palliative wound care with 1/4% Dakins for odor control. Change daily.  Low air loss mattress in place for pressure redistribution, moisture management and comfort    Re consult if needed, will not follow at this time. Thanks  [employer], RN,CWOCN, CNS, [place] [phone])

## 2022-06-14 NOTE — Care Management Important Message (Signed)
Important Message  Patient Details  Name: Jason Mcpherson MRN: [id number] Date of Birth: July 22, 1940   Medicare Important Message Given:  Yes     Memory Argue 06/14/2022, 3:50 PM

## 2022-06-14 NOTE — Progress Notes (Signed)
SLP Cancellation Note  Patient Details Name: Jason Mcpherson MRN: [id number] DOB: 10-Mar-1941   Cancelled treatment:       Reason Eval/Treat Not Completed: Other (comment) (patient transitioning to full comfort care. SLP to s/o at this time.)  Sonia Baller, MA, CCC-SLP Speech Therapy

## 2022-06-14 NOTE — Progress Notes (Signed)
[facility]) Hospital Liaison Note  Received request from Transitions of Care Manager Elsie Amis for family interest in [facility]. Per hospital staff, family has decided to pursue [place] due to being closer to where the family lives.     Please do not hesitate to call with any hospice related questions.      Zigmund Gottron, RN [facility] Liaison  [phone]

## 2022-06-14 NOTE — TOC Progression Note (Signed)
Transition of Care [facility]) - Progression Note    Patient Details  Name: Jason Mcpherson MRN: [id number] Date of Birth: October 13, 1940  Transition of Care [facility]) CM/SW Contact  Ellamae Lybeck, Juliann Pulse, RN Phone Number: 06/14/2022, 10:39 AM  Clinical Narrative: Spoke to spouseReva defers to son [facility] in  HP rep Hiliary aware-await eval outcome.      Expected Discharge Plan: [place] Barriers to Discharge: Continued Medical Work up  Expected Discharge Plan and Services   Discharge Planning Services: CM Consult   Living arrangements for the past 2 months: Single Family Home                                       Social Determinants of Health (SDOH) Interventions SDOH Screenings   Food Insecurity: No Food Insecurity (06/13/2022)  Housing: Low Risk  (06/13/2022)  Transportation Needs: No Transportation Needs (06/13/2022)  Utilities: Not At Risk (06/13/2022)  Tobacco Use: Medium Risk (06/12/2022)    Readmission Risk Interventions    06/13/2022    2:40 PM 01/31/2022    2:47 PM  Readmission Risk Prevention Plan  Transportation Screening Complete Complete  PCP or Specialist Appt within 5-7 Days  Complete  PCP or Specialist Appt within 3-5 Days Complete   Home Care Screening  Complete  Medication Review (RN CM)  Complete  HRI or Home Care Consult Complete   Social Work Consult for [place] Planning/Counseling Complete   Palliative Care Screening Complete   Medication Review Press photographer) Complete

## 2022-06-14 NOTE — TOC Transition Note (Signed)
Transition of Care [facility]) - CM/SW Discharge Note   Patient Details  Name: NEMESIO PHANTHAVONG MRN: [id number] Date of Birth: 30-Aug-1940  Transition of Care [facility]) CM/SW Contact:  Dessa Phi, RN Phone Number: 06/14/2022, 1:54 PM   Clinical Narrative: [place] accepted-for d/c by [facility] scheduled 4p pick up. Report tel#[phone].No further CM needs.      Final next level of care: [place] Barriers to Discharge: No Barriers Identified   Patient Goals and CMS Choice      Discharge Placement                         Discharge Plan and Services Additional resources added to the After Visit Summary for     Discharge Planning Services: CM Consult                                 Social Determinants of Health (SDOH) Interventions SDOH Screenings   Food Insecurity: No Food Insecurity (06/13/2022)  Housing: Low Risk  (06/13/2022)  Transportation Needs: No Transportation Needs (06/13/2022)  Utilities: Not At Risk (06/13/2022)  Tobacco Use: Medium Risk (06/12/2022)     Readmission Risk Interventions    06/13/2022    2:40 PM 01/31/2022    2:47 PM  Readmission Risk Prevention Plan  Transportation Screening Complete Complete  PCP or Specialist Appt within 5-7 Days  Complete  PCP or Specialist Appt within 3-5 Days Complete   Home Care Screening  Complete  Medication Review (RN CM)  Complete  HRI or Home Care Consult Complete   Social Work Consult for [place] Planning/Counseling Complete   Palliative Care Screening Complete   Medication Review Press photographer) Complete

## 2022-06-14 NOTE — Consult Note (Signed)
Consultation Note Date: 06/14/2022   Patient Name: Jason Mcpherson  DOB: November 20, 1940  MRN: [id number]  Age / Sex: 82 y.o., male  PCP: Patrecia Pour, Christean Grief, MD Referring Physician: Modena Jansky, MD  Reason for Consultation: Terminal Care  HPI/Patient Profile: 82 y.o. male  with past medical history of DM2, HTN, HLD, stage IIIb chronic kidney disease, hard of hearing, appears to have had sacral pressure ulcers at least since April 2023 followed by wound care OP, admitted on 06/12/2022 with suspected aspiration pneumonia, polymicrobial bacteremia suspected due to large decubitus ulcers, acute septic metabolic encephalopathy and severe adult failure to thrive and decline  .   Clinical Assessment and Goals of Care: Patient transitioned to comfort measures after primary team's discussions with family.  PMT consult continues for terminal care,symptom management at end of life, support to family and for hospice disposition options.   Patient is resting in bed, shallow regular respirations, some congestion noted. Son and wife at bedside.  Palliative medicine is specialized medical care for people living with serious illness. It focuses on providing relief from the symptoms and stress of a serious illness. The goal is to improve quality of life for both the patient and the family. Goals of care: Broad aims of medical therapy in relation to the patient's values and preferences. Our aim is to provide medical care aimed at enabling patients to achieve the goals that matter most to them, given the circumstances of their particular medical situation and their constraints.     NEXT OF KIN  Son and wife.   DISCUSSION/SUMMARY OF RECOMMENDATIONS    DNR DNI Comfort care Add scopolamine patch for secretions Prognosis few days Residential hospice evaluation Family wishes to discuss with [facility].    Terminal Care management.   In the final hours, patients exhibit specific clinical signs that indicate the approach of death.  Clinical Signs:  1.Death rattle  Management Techniques: Educate the family about the nature of the sound Reposition the patient to promote drainage Consider using anticholinergic medications if the patient is suffering.  2.Apnea. Management Techniques:  Educate the family about the possibility of irregular breathing patterns Administer opioids if dyspnea is present.  3.Respiration with mandibular movement  Management Techniques: Educate the family about changes in breathing patterns.  4.Decreased urine output  Management Techniques: Educate the family about the natural decrease in bodily functions.  5.Pulselessness of radial artery  Management Techniques: Educate the family about changes in circulation and perfusion.  6.Inability to close eyelids  Management Techniques: Educate the family about the physical changes in muscle control Use a wet washcloth if eyes are dry or irritated.  7.Grunting of vocal cords  Management Techniques: Educate the family the family about the possibility of vocal cord tension Administer opioids if pain is present.  8.Fever  Management Techniques: Place a cool washcloth on the patient's forehead Remove excess blankets Use a fan Administer acetominophin if necessary  Code Status/Advance Care Planning: DNR   Symptom Management:     Palliative  Prophylaxis:  Frequent Pain Assessment  Additional Recommendations (Limitations, Scope, Preferences): Full Comfort Care  Psycho-social/Spiritual:  Desire for further Chaplaincy support:yes Additional Recommendations: Education on Hospice  Prognosis:  Hours - Days  Discharge Planning: Hospice facility      Primary Diagnoses: Present on Admission:  Multifocal pneumonia  Normocytic anemia  Gastroesophageal reflux disease  Mixed hyperlipidemia   Uncontrolled type 2 diabetes mellitus with hyperglycemia (HCC)  Pressure injury of left buttock, stage 3 (HCC)  Constipation  Iron deficiency anemia  Essential hypertension, benign  Chronic kidney disease, stage 3b ([place])   I have reviewed the medical record, interviewed the patient and family, and examined the patient. The following aspects are pertinent.  Past Medical History:  Diagnosis Date   Anemia    Arthritis    KNEES & BACK   Cellulitis 08/2013   RT LEG   Chronic kidney disease 08/2013   ACUTE RENAL    Dehydration    Diabetes mellitus without complication ([place])    Hyperkalemia 08/2013   Hyperlipemia    Hypertension    Social History   Socioeconomic History   Marital status: Married    Spouse name: Not on file   Number of children: Not on file   Years of education: Not on file   Highest education level: Not on file  Occupational History   Not on file  Tobacco Use   Smoking status: Former    Types: Cigarettes    Quit date: 10/12/1997    Years since quitting: 24.6   Smokeless tobacco: Never  Substance and Sexual Activity   Alcohol use: No   Drug use: No   Sexual activity: Not on file  Other Topics Concern   Not on file  Social History Narrative   Not on file   Social Determinants of Health   Financial Resource Strain: Not on file  Food Insecurity: No Food Insecurity (06/13/2022)   Hunger Vital Sign    Worried About Running Out of Food in the Last Year: Never true    Ran Out of Food in the Last Year: Never true  Transportation Needs: No Transportation Needs (06/13/2022)   PRAPARE - Hydrologist (Medical): No    Lack of Transportation (Non-Medical): No  Physical Activity: Not on file  Stress: Not on file  Social Connections: Not on file   Family History  Problem Relation Age of Onset   Cancer - Colon Mother    Cancer - Prostate Father    Scheduled Meds:  mouth rinse  15 mL Mouth Rinse 4 times per day   mouth rinse  15 mL  Mouth Rinse 4 times per day   mouth rinse  15 mL Mouth Rinse 4 times per day   scopolamine  1 patch Transdermal Q72H   Continuous Infusions: PRN Meds:.acetaminophen **OR** acetaminophen, antiseptic oral rinse, glycopyrrolate **OR** glycopyrrolate **OR** glycopyrrolate, haloperidol **OR** haloperidol **OR** haloperidol lactate, HYDROmorphone (DILAUDID) injection, LORazepam, ondansetron **OR** ondansetron (ZOFRAN) IV, mouth rinse, mouth rinse, mouth rinse, oxyCODONE, polyvinyl alcohol, traZODone Medications Prior to Admission:  Prior to Admission medications   Medication Sig Start Date End Date Taking? Authorizing Provider  acetaminophen (TYLENOL) 500 MG tablet Take 1,000 mg by mouth every 6 (six) hours as needed for mild pain.   Yes [provider]  amLODipine (NORVASC) 5 MG tablet TAKE 1 TABLET (5 MG TOTAL) BY MOUTH DAILY. 07/05/20  Yes Elayne Snare, MD  apixaban (ELIQUIS) 5 MG TABS tablet Take 1 tablet (5 mg  total) by mouth 2 (two) times daily. 02/12/22  Yes Hosie Poisson, MD  aspirin EC 81 MG tablet Take 81 mg by mouth at bedtime. Swallow whole.   Yes [provider]  bisacodyl (DULCOLAX) 10 MG suppository Place 1 suppository (10 mg total) rectally daily as needed for moderate constipation. 02/07/22  Yes Hosie Poisson, MD  docusate sodium (COLACE) 100 MG capsule Take 1 capsule (100 mg total) by mouth 2 (two) times daily. Patient taking differently: Take 100 mg by mouth 2 (two) times daily as needed for mild constipation. 02/07/22  Yes Hosie Poisson, MD  Ensure Max Protein (ENSURE MAX PROTEIN) LIQD Take 330 mLs (11 oz total) by mouth 2 (two) times daily. Patient taking differently: Take 11 oz by mouth daily. 02/07/22 02/07/23 Yes Hosie Poisson, MD  FARXIGA 5 MG TABS tablet Take 5 mg by mouth daily. 01/15/22  Yes [provider]  ferrous sulfate 325 (65 FE) MG tablet Take 325 mg by mouth every morning. 12/28/21  Yes [provider]  glimepiride (AMARYL) 1 MG tablet Take  2 mg by mouth 2 (two) times daily. 12/11/21  Yes [provider]  Multiple Vitamin (MULTIVITAMIN WITH MINERALS) TABS tablet Take 1 tablet by mouth daily. 02/08/22  Yes Hosie Poisson, MD  polyethylene glycol (MIRALAX / GLYCOLAX) 17 g packet Take 17 g by mouth daily as needed for mild constipation. 02/07/22  Yes Hosie Poisson, MD  simvastatin (ZOCOR) 20 MG tablet TAKE 1 TABLET BY MOUTH EVERY DAY IN THE EVENING Patient taking differently: Take 20 mg by mouth every evening. 03/30/20  Yes Elayne Snare, MD  spironolactone (ALDACTONE) 25 MG tablet Take 12.5 mg by mouth daily. 05/24/22  Yes [provider]  torsemide (DEMADEX) 10 MG tablet Take 10 mg by mouth daily as needed for edema. 10/19/21  Yes [provider]  Vitamin D, Ergocalciferol, (DRISDOL) 1.25 MG (50000 UNIT) CAPS capsule Take 50,000 Units by mouth once a week. Saturday 01/05/22  Yes [provider]  glucose blood (ONETOUCH ULTRA) test strip USE AS DIRECTED TO TEST TWICE DAILY 05/13/20   Elayne Snare, MD  Lancets (ONETOUCH DELICA PLUS [id number]) Hooks 2X DAILY. 06/06/20   Elayne Snare, MD  [facility] DELICA LANCETS FINE MISC USED TO CHECK SUGAR 2X DAILY. 12/28/16   Elayne Snare, MD   Allergies  Allergen Reactions   Amlodipine Swelling    ANKLE EDEMA   Review of Systems Non verbal  Physical Exam Unresponsive Non verbal Mild to moderate distress from secretions Shallow breath sounds No edema Frailty evident  Vital Signs: BP (!) 100/53 (BP Location: Left Arm)   Pulse 78   Temp 98.2 F (36.8 C) (Oral)   Resp 14   Ht 5' 8"$  (1.727 m)   Wt 71.7 kg   SpO2 98%   BMI 24.03 kg/m  Pain Scale: 0-10   Pain Score: 10-Worst pain ever   SpO2: SpO2: 98 % O2 Device:SpO2: 98 % O2 Flow Rate: .   IO: Intake/output summary:  Intake/Output Summary (Last 24 hours) at 06/14/2022 1021 Last data filed at 06/14/2022 [id number] Gross per 24 hour  Intake 428.74 ml  Output 1400 ml  Net -971.26 ml    LBM:  Last BM Date : 06/12/22 Baseline Weight: Weight: 71.7 kg Most recent weight: Weight: 71.7 kg     Palliative Assessment/Data:   PPS 20%  Time In:  9.20 Time Out:  10.20 Time Total:  60  Greater than 50%  of this time was  spent counseling and coordinating care related to the above assessment and plan.  Signed by: Loistine Chance, MD   Please contact Palliative Medicine Team phone at [phone] for questions and concerns.  For individual provider: See Shea Evans

## 2022-06-14 NOTE — Discharge Summary (Addendum)
Physician Discharge Summary  Jason Mcpherson [id number] DOB: 08/12/1940  PCP: Doreatha Lew, MD  Admitted from: Home Discharged to: [facility] in [place] date: 06/12/2022 Discharge date: 06/14/2022  Recommendations for Outpatient Follow-up:    Follow-up [place] in [facility] Follow up.          Patrecia Pour, Christean Grief, MD. Call.   Specialty: Family Medicine Why: Juluis Rainier, As needed Contact information: [place] [employer] [place] [address] [phone]                  Home Health: None    Equipment/Devices: None    Discharge Condition: Poor prognosis, <15 days.   Code Status: DNR Diet recommendation:  Discharge Diet Orders (From admission, onward)     Start     Ordered   06/14/22 0000  Diet general        06/14/22 1417             Discharge Diagnoses:  Principal Problem:   Multifocal pneumonia Active Problems:   Essential hypertension, benign   Normocytic anemia   Gastroesophageal reflux disease   Pressure injury of left buttock, stage 3 (HCC)   Uncontrolled type 2 diabetes mellitus with hyperglycemia (HCC)   Mixed hyperlipidemia   Iron deficiency anemia   Constipation   Chronic kidney disease, stage 3b (HCC)   Brief Summary: 82 year old married male, lives with his spouse and adult son, up to 2 weeks ago ambulated with the help of walker and since he got ill, transition to a wheelchair but in the last 5 days PTA has been bedbound, PMH of DM2, HTN, HLD, stage IIIb chronic kidney disease, hard of hearing, appears to have had sacral pressure ulcers at least since April 2023 followed by wound care OP, presented to the [facility] ED on 06/12/2022 due to altered mental status.  Workup revealed suspected aspiration pneumonia, polymicrobial bacteremia suspected due to large decubitus ulcers, acute septic metabolic encephalopathy and severe adult failure to thrive and decline.  ID and palliative team  were consulted.  After multiple and extensive discussions with spouse and son at bedside regarding aggressive medical care versus transitioning to more comfort oriented care, they finally opted to transition to full comfort care.  TOC consulted for possible residential hospice.     Assessment & Plan:   Adult failure to thrive, severe Multifactorial due to very advanced age, severe physical deconditioning and severe malnutrition, large nonhealing sacral decubitus ulcer, nonambulatory, current hospitalization with polymicrobial bacteremia.  Overall very poor prognosis.  Discussed in detail with patient's spouse and adult son at bedside regarding continued aggressive full scope of medical care with low likelihood of recovery to have a good quality of life and even if that happens, may be several months versus transitioning to comfort oriented care.  Answered all their questions.  They then voluntarily transitioned him to full comfort care, aware that this will likely hasten his demise, just wanted him to be comfortable and would like him to go to a hospice home.  All medications and labs nonessential to comfort were discontinued.  TOC consulted for residential hospice placement.  As per family at bedside, patient much more comfortable now.  Still with some pain in the buttock area from his extensive wounds when he tries to move or during dressing changes.  Patient will need to be heavily premedicated prior to dressing changes to minimize pain and discomfort.  Due to  proximity for them to visit him, family decided to opt for [facility].  Palliative care MD input appreciated.  Patient discharging to residential hospice today.  Following our the diagnosis list during this hospital admission.   Aspiration RML, RLL pneumonia Polymicrobial bacteremia likely from the large nonhealing sacral decubitus ulcers, POA versus intra-abdominal source although he has no signs and symptoms. Stage III  multiple  sacral decubitus ulcers (as noted in picture below), POA, nonhealing. Uncontrolled type II DM with hyperglycemia Essential hypertension Stage IIIb chronic kidney disease/non-anion gap metabolic acidosis: Creatinine worsened slightly likely related to CT contrast Mixed hyperlipidemia Anemia due to CKD/chronic disease/iron deficiency GERD Severe Malnutrition as evidenced by bitemporal wasting, hllowed out buccal area, decreased subcut fat, in the context of poor oral intake and chronic severe illnesses.   Body mass index is 24.03 kg/m.  Pressure Injury 01/31/22 Buttocks Right Deep Tissue Pressure Injury - Purple or maroon localized area of discolored intact skin or blood-filled blister due to damage of underlying soft tissue from pressure and/or shear. DTPI evolving to Unstageable (Active)  01/31/22 2030  Location: Buttocks  Location Orientation: Right  Staging: Deep Tissue Pressure Injury - Purple or maroon localized area of discolored intact skin or blood-filled blister due to damage of underlying soft tissue from pressure and/or shear.  Wound Description (Comments): DTPI evolving to Unstageable  Present on Admission: Yes (unknown)     Pressure Injury 01/31/22 Coccyx Right;Left Deep Tissue Pressure Injury - Purple or maroon localized area of discolored intact skin or blood-filled blister due to damage of underlying soft tissue from pressure and/or shear. DTPI evolving to unstageable (Active)  01/31/22 2030  Location: Coccyx  Location Orientation: Right;Left  Staging: Deep Tissue Pressure Injury - Purple or maroon localized area of discolored intact skin or blood-filled blister due to damage of underlying soft tissue from pressure and/or shear.  Wound Description (Comments): DTPI evolving to unstageable  Present on Admission: Yes (unknown)     Pressure Injury 06/12/22 Heel Left Deep Tissue Pressure Injury - Purple or maroon localized area of discolored intact skin or blood-filled blister  due to damage of underlying soft tissue from pressure and/or shear. (Active)  06/12/22 1200  Location: Heel  Location Orientation: Left  Staging: Deep Tissue Pressure Injury - Purple or maroon localized area of discolored intact skin or blood-filled blister due to damage of underlying soft tissue from pressure and/or shear.  Wound Description (Comments):   Present on Admission: Yes     Pressure Injury 06/12/22 Buttocks Left Unstageable - Full thickness tissue loss in which the base of the injury is covered by slough (yellow, tan, gray, green or brown) and/or eschar (tan, brown or black) in the wound bed. (Active)  06/12/22 1615  Location: Buttocks  Location Orientation: Left  Staging: Unstageable - Full thickness tissue loss in which the base of the injury is covered by slough (yellow, tan, gray, green or brown) and/or eschar (tan, brown or black) in the wound bed.  Wound Description (Comments):   Present on Admission: Yes     Pressure Injury 06/12/22 Coccyx Deep Tissue Pressure Injury - Purple or maroon localized area of discolored intact skin or blood-filled blister due to damage of underlying soft tissue from pressure and/or shear. (Active)  06/12/22 1617  Location: Coccyx  Location Orientation:   Staging: Deep Tissue Pressure Injury - Purple or maroon localized area of discolored intact skin or blood-filled blister due to damage of underlying soft tissue from pressure and/or shear.  Wound Description (Comments):   Present on Admission: Yes    Consultations: Infectious disease Palliative care medicine  Procedures: None   Discharge Instructions  Discharge Instructions     Call MD for:  difficulty breathing, headache or visual disturbances   Complete by: As directed    Call MD for:  extreme fatigue   Complete by: As directed    Call MD for:  persistant dizziness or light-headedness   Complete by: As directed    Call MD for:  persistant nausea and vomiting   Complete by: As  directed    Call MD for:  redness, tenderness, or signs of infection (pain, swelling, redness, odor or green/yellow discharge around incision site)   Complete by: As directed    Call MD for:  severe uncontrolled pain   Complete by: As directed    Call MD for:  temperature >100.4   Complete by: As directed    Diet general   Complete by: As directed    Discharge wound care:   Complete by: As directed    As per residential hospice protocol.   Increase activity slowly   Complete by: As directed         Medication List     STOP taking these medications    amLODipine 5 MG tablet Commonly known as: NORVASC   apixaban 5 MG Tabs tablet Commonly known as: ELIQUIS   aspirin EC 81 MG tablet   bisacodyl 10 MG suppository Commonly known as: DULCOLAX   docusate sodium 100 MG capsule Commonly known as: COLACE   Ensure Max Protein Liqd   Farxiga 5 MG Tabs tablet Generic drug: dapagliflozin propanediol   ferrous sulfate 325 (65 FE) MG tablet   glimepiride 1 MG tablet Commonly known as: AMARYL   multivitamin with minerals Tabs tablet   OneTouch Delica Lancets Fine Misc   OneTouch Delica Plus 0000000 Misc   OneTouch Ultra test strip Generic drug: glucose blood   polyethylene glycol 17 g packet Commonly known as: MIRALAX / GLYCOLAX   simvastatin 20 MG tablet Commonly known as: ZOCOR   spironolactone 25 MG tablet Commonly known as: ALDACTONE   torsemide 10 MG tablet Commonly known as: DEMADEX   Vitamin D (Ergocalciferol) 1.25 MG (50000 UNIT) Caps capsule Commonly known as: DRISDOL       TAKE these medications    acetaminophen 325 MG tablet Commonly known as: TYLENOL Take 2 tablets (650 mg total) by mouth every 6 (six) hours as needed for mild pain (or Fever >/= 101). What changed:  medication strength how much to take reasons to take this   HYDROmorphone 1 MG/ML injection Commonly known as: DILAUDID Inject 0.5 mLs (0.5 mg total) into the vein every 2  (two) hours as needed for severe pain (or dyspnea).   LORazepam 2 MG/ML concentrated solution Commonly known as: ATIVAN Take 0.5 mLs (1 mg total) by mouth every 6 (six) hours as needed for anxiety, seizure or sedation.   oxyCODONE 5 MG immediate release tablet Commonly known as: Oxy IR/ROXICODONE Take 1 tablet (5 mg total) by mouth every 4 (four) hours as needed for moderate pain (or dyspnea).   polyvinyl alcohol 1.4 % ophthalmic solution Commonly known as: LIQUIFILM TEARS Place 1 drop into both eyes 4 (four) times daily as needed for dry eyes.   scopolamine 1 MG/3DAYS Commonly known as: TRANSDERM-SCOP Place 1 patch (1.5 mg total) onto the skin every 3 (three) days. Start taking on: June 17, 2022  Allergies  Allergen Reactions   Amlodipine Swelling    ANKLE EDEMA      Procedures/Studies: CT Angio Chest Pulmonary Embolism (PE) W or WO Contrast  Result Date: 06/12/2022 CLINICAL DATA:  Altered mental status. Abnormal masslike opacity on chest radiograph. Clinical suspicion for pulmonary infarct and embolism. EXAM: CT ANGIOGRAPHY CHEST WITH CONTRAST TECHNIQUE: Multidetector CT imaging of the chest was performed using the standard protocol during bolus administration of intravenous contrast. Multiplanar CT image reconstructions and MIPs were obtained to evaluate the vascular anatomy. RADIATION DOSE REDUCTION: This exam was performed according to the departmental dose-optimization program which includes automated exposure control, adjustment of the mA and/or kV according to patient size and/or use of iterative reconstruction technique. CONTRAST:  30m OMNIPAQUE IOHEXOL 350 MG/ML SOLN COMPARISON:  None Available. FINDINGS: Cardiovascular: Satisfactory opacification of pulmonary arteries noted, and no pulmonary emboli identified. No evidence of thoracic aortic dissection or aneurysm. Aortic and coronary atherosclerotic calcification incidentally noted. Mediastinum/Nodes: No masses  or pathologically enlarged lymph nodes identified. Lungs/Pleura: Pulmonary consolidation is seen in the right middle lobe, with less severe airspace disease in the peripheral right upper lobe highly suspicious for pneumonia. Mild centrilobular emphysema noted. No evidence of pleural effusion. Upper abdomen: No acute findings. Musculoskeletal: No suspicious bone lesions identified. Review of the MIP images confirms the above findings. IMPRESSION: No evidence of pulmonary embolism. Pulmonary consolidation in right middle lobe and airspace disease in right upper lobe, highly suspicious for pneumonia. Aortic Atherosclerosis (ICD10-I70.0) and Emphysema (ICD10-J43.9). Electronically Signed   By: JMarlaine HindM.D.   On: 06/12/2022 09:24   CT HEAD WO CONTRAST (5MM)  Result Date: 06/12/2022 CLINICAL DATA:  82year old male with history of altered mental status. EXAM: CT HEAD WITHOUT CONTRAST TECHNIQUE: Contiguous axial images were obtained from the base of the skull through the vertex without intravenous contrast. RADIATION DOSE REDUCTION: This exam was performed according to the departmental dose-optimization program which includes automated exposure control, adjustment of the mA and/or kV according to patient size and/or use of iterative reconstruction technique. COMPARISON:  No priors. FINDINGS: Brain: Moderate cerebral and mild cerebellar atrophy. Ex vacuo dilatation of the ventricular system. Patchy and confluent areas of decreased attenuation are noted throughout the deep and periventricular white matter of the cerebral hemispheres bilaterally, compatible with chronic microvascular ischemic disease. No evidence of acute infarction, hemorrhage, hydrocephalus, extra-axial collection or mass lesion/mass effect. Vascular: No hyperdense vessel or unexpected calcification. Skull: Normal. Negative for fracture or focal lesion. Sinuses/Orbits: No acute finding. Other: None. IMPRESSION: 1. No acute intracranial  abnormalities. 2. Moderate cerebral and mild cerebellar atrophy with chronic microvascular ischemic changes in the cerebral white matter, as above. Electronically Signed   By: DVinnie LangtonM.D.   On: 06/12/2022 06:34   DG Chest Port 1 View  Result Date: 06/12/2022 CLINICAL DATA:  82year old male with history of altered mental status. EXAM: PORTABLE CHEST 1 VIEW COMPARISON:  Chest x-ray 02/26/2022. FINDINGS: New airspace consolidation in the periphery of the right lung near the base, somewhat wedge-shaped in appearance. Left lung is clear. No definite pleural effusions. No pneumothorax. No evidence of pulmonary edema. Heart size is normal. The patient is rotated to the right on today's exam, resulting in distortion of the mediastinal contours and reduced diagnostic sensitivity and specificity for mediastinal pathology. Atherosclerotic calcifications in the thoracic aorta. IMPRESSION: 1. New wedge-shaped area of consolidation in the periphery of the right lung base either in the right middle or lower lobes. In the appropriate clinical setting, this  may simply represent a pneumonia. Alternatively, the possibility of alveolar hemorrhage from pulmonary embolism should be considered. Followup PA and lateral chest X-ray is recommended in 3-4 weeks following trial of antibiotic therapy to ensure resolution and exclude underlying malignancy. If pulmonary embolism is of concern, further evaluation with PE protocol CT scan should be obtained at this time. 2. Aortic atherosclerosis. Electronically Signed   By: Vinnie Langton M.D.   On: 06/12/2022 06:32   DG Hand Complete Left  Result Date: 06/12/2022 CLINICAL DATA:  82 year old male with history of left hand swelling. Altered mental status. EXAM: LEFT HAND - COMPLETE 3+ VIEW COMPARISON:  No priors. FINDINGS: Abnormal angulation of the thumb at the first metacarpal phalangeal joint, suggesting severe subluxation or dislocation, although poorly evaluated on today's  nonstandard images. No acute displaced fracture. Severe multifocal joint space narrowing, subchondral sclerosis, subchondral cyst formation and osteophyte formation noted throughout the hand and wrist, most severe at the D IP and PIP joints, indicative of advanced osteoarthritis. IMPRESSION: 1. Severe subluxation or dislocation of the thumb at the first MCP joint. 2. Advanced osteoarthritis in the hand, as above. Electronically Signed   By: Vinnie Langton M.D.   On: 06/12/2022 06:30      Subjective: Patient was sound asleep and appeared peaceful and comfortable.  Did not attempt to wake him up.  Spoke with family at bedside who reported history as noted above.  They indicated that he did eat a good breakfast this morning.  Discharge Exam:  Vitals:   06/13/22 0050 06/13/22 0506 06/13/22 1530 06/14/22 0309  BP: (!) 103/55 (!) 106/51 (!) 107/55 (!) 100/53  Pulse: 73 81 83 78  Resp: 20 18  14  $ Temp: 97.9 F (36.6 C) 97.8 F (36.6 C) (!) 97.5 F (36.4 C) 98.2 F (36.8 C)  TempSrc: Oral Oral Axillary Oral  SpO2: 98% 97% 100% 98%  Weight:      Height:        General exam: Elderly male, moderately built, poorly nourished, chronically ill looking, bitemporal muscle wasting, decreased subcutaneous fat especially in the cheeks, lying supine in bed.  Did not appear in any pain or discomfort.  Did not attempt to wake him up.  Following exam is as of 06/13/2022. Respiratory system: Clear to auscultation anteriorly.  Diminished breath sounds in the bases with occasional basal crackles.  No increased work of breathing. Cardiovascular system: S1 & S2 heard, RRR. No JVD, murmurs, rubs, gallops or clicks. No pedal edema.  Telemetry personally reviewed: Sinus rhythm, BBB morphology. Gastrointestinal system: Abdomen is nondistended, soft and nontender. No organomegaly or masses felt. Normal bowel sounds heard. Central nervous system: Alert and oriented to self and son. No focal neurological deficits.   Extremely hard of hearing, has broken to hearing aids and has none at home. Extremities: Symmetric 5 x 5 power. Skin: Multiple large sacral decubitus ulcers as noted in picture below taken on admission Psychiatry: Judgement and insight impaired. Mood & affect cannot be assessed or flat    The results of significant diagnostics from this hospitalization (including imaging, microbiology, ancillary and laboratory) are listed below for reference.     Microbiology: Recent Results (from the past 240 hour(s))  Culture, blood (Routine X 2) w Reflex to ID Panel     Status: Abnormal (Preliminary result)   Collection Time: 06/12/22  7:02 AM   Specimen: Right Antecubital; Blood  Result Value Ref Range Status   Specimen Description RIGHT ANTECUBITAL BLOOD  Final   Special  Requests   Final    Blood Culture adequate volume BOTTLES DRAWN AEROBIC AND ANAEROBIC   Culture  Setup Time   Final    IN BOTH AEROBIC AND ANAEROBIC BOTTLES GRAM NEGATIVE RODS GRAM POSITIVE COCCI CRITICAL RESULT CALLED TO, READ BACK BY AND VERIFIED WITH: PHARMD E. JACKSON 06/12/22 @ 2309 BY AB    Culture (A)  Final    PROTEUS PENNERI STAPHYLOCOCCUS AUREUS ENTEROCOCCUS FAECALIS CULTURE REINCUBATED FOR BETTER GROWTH Performed at [facility], [place] [address] [place], [place] [address]    Report Status PENDING  Incomplete  Blood Culture ID Panel (Reflexed)     Status: Abnormal   Collection Time: 06/12/22  7:02 AM  Result Value Ref Range Status   Enterococcus faecalis DETECTED (A) NOT DETECTED Final    Comment: CRITICAL RESULT CALLED TO, READ BACK BY AND VERIFIED WITH: PHARMD E. JACKSON 06/12/22 @ 2309 BY AB    Enterococcus Faecium NOT DETECTED NOT DETECTED Final   Listeria monocytogenes NOT DETECTED NOT DETECTED Final   Staphylococcus species DETECTED (A) NOT DETECTED Final    Comment: CRITICAL RESULT CALLED TO, READ BACK BY AND VERIFIED WITH: PHARMD E. JACKSON 06/12/22 @ 2309 BY AB    Staphylococcus aureus (BCID)  DETECTED (A) NOT DETECTED Final    Comment: Methicillin (oxacillin) susceptible Staphylococcus aureus (MSSA). Preferred therapy is anti staphylococcal beta lactam antibiotic (Cefazolin or Nafcillin), unless clinically contraindicated. CRITICAL RESULT CALLED TO, READ BACK BY AND VERIFIED WITH: PHARMD E. JACKSON 06/12/22 @ 2309 BY AB    Staphylococcus epidermidis DETECTED (A) NOT DETECTED Final    Comment: CRITICAL RESULT CALLED TO, READ BACK BY AND VERIFIED WITH: PHARMD E. JACKSON 06/12/22 @ 2309 BY AB    Staphylococcus lugdunensis NOT DETECTED NOT DETECTED Final   Streptococcus species DETECTED (A) NOT DETECTED Final    Comment: Not Enterococcus species, Streptococcus agalactiae, Streptococcus pyogenes, or Streptococcus pneumoniae. CRITICAL RESULT CALLED TO, READ BACK BY AND VERIFIED WITH: PHARMD E. JACKSON 06/12/22 @ 2309 BY AB    Streptococcus agalactiae NOT DETECTED NOT DETECTED Final   Streptococcus pneumoniae NOT DETECTED NOT DETECTED Final   Streptococcus pyogenes NOT DETECTED NOT DETECTED Final   A.calcoaceticus-baumannii NOT DETECTED NOT DETECTED Final   Bacteroides fragilis NOT DETECTED NOT DETECTED Final   Enterobacterales DETECTED (A) NOT DETECTED Final    Comment: Enterobacterales represent a large order of gram negative bacteria, not a single organism. CRITICAL RESULT CALLED TO, READ BACK BY AND VERIFIED WITH: PHARMD E. JACKSON 06/12/22 @ 2309 BY AB    Enterobacter cloacae complex NOT DETECTED NOT DETECTED Final   Escherichia coli NOT DETECTED NOT DETECTED Final   Klebsiella aerogenes NOT DETECTED NOT DETECTED Final   Klebsiella oxytoca NOT DETECTED NOT DETECTED Final   Klebsiella pneumoniae NOT DETECTED NOT DETECTED Final   Proteus species DETECTED (A) NOT DETECTED Final    Comment: CRITICAL RESULT CALLED TO, READ BACK BY AND VERIFIED WITH: PHARMD E. JACKSON 06/12/22 @ 2309 BY AB    Salmonella species NOT DETECTED NOT DETECTED Final   Serratia marcescens NOT DETECTED NOT  DETECTED Final   Haemophilus influenzae NOT DETECTED NOT DETECTED Final   Neisseria meningitidis NOT DETECTED NOT DETECTED Final   Pseudomonas aeruginosa NOT DETECTED NOT DETECTED Final   Stenotrophomonas maltophilia NOT DETECTED NOT DETECTED Final   Candida albicans NOT DETECTED NOT DETECTED Final   Candida auris NOT DETECTED NOT DETECTED Final   Candida glabrata NOT DETECTED NOT DETECTED Final   Candida krusei NOT DETECTED NOT  DETECTED Final   Candida parapsilosis NOT DETECTED NOT DETECTED Final   Candida tropicalis NOT DETECTED NOT DETECTED Final   Cryptococcus neoformans/gattii NOT DETECTED NOT DETECTED Final   CTX-M ESBL NOT DETECTED NOT DETECTED Final   Carbapenem resistance IMP NOT DETECTED NOT DETECTED Final   Carbapenem resistance KPC NOT DETECTED NOT DETECTED Final   Methicillin resistance mecA/C NOT DETECTED NOT DETECTED Final   Meth resistant mecA/C and MREJ NOT DETECTED NOT DETECTED Final   Carbapenem resistance NDM NOT DETECTED NOT DETECTED Final   Carbapenem resist OXA 48 LIKE NOT DETECTED NOT DETECTED Final   Vancomycin resistance NOT DETECTED NOT DETECTED Final   Carbapenem resistance VIM NOT DETECTED NOT DETECTED Final    Comment: Performed at [facility] [address] [place], [place] [address]  Culture, blood (Routine X 2) w Reflex to ID Panel     Status: Abnormal (Preliminary result)   Collection Time: 06/12/22  7:23 AM   Specimen: Left Antecubital; Blood  Result Value Ref Range Status   Specimen Description LEFT ANTECUBITAL BLOOD  Final   Special Requests   Final    Blood Culture adequate volume BOTTLES DRAWN AEROBIC AND ANAEROBIC   Culture  Setup Time   Final    GRAM NEGATIVE RODS GRAM VARIABLE ROD ANAEROBIC BOTTLE ONLY GRAM NEGATIVE RODS AEROBIC BOTTLE ONLY Gram Stain Report Called to,Read Back By and Verified With: PHARMD E. JACKSON 06/12/22 @ 2309 BY AB    Culture (A)  Final    PROTEUS MIRABILIS MORGANELLA MORGANII SUSCEPTIBILITIES TO  FOLLOW CRITICAL RESULT CALLED TO, READ BACK BY AND VERIFIED WITH: Parker [id number] FCP Performed at [facility], [place] [address] [place], [place] [address]    Report Status PENDING  Incomplete     Labs: CBC: Recent Labs  Lab 06/12/22 0519 06/13/22 0608  WBC 19.8* 18.7*  NEUTROABS 18.6*  --   HGB 8.2* 7.3*  HCT 28.0* 24.2*  MCV 89.2 86.7  PLT 518* [id number]    Basic Metabolic Panel: Recent Labs  Lab 06/12/22 0519 06/13/22 0443  NA 133* 136  K 4.8 4.6  CL 113* 115*  CO2 11* 11*  GLUCOSE 422* 89  BUN 113* 108*  CREATININE 1.94* 2.30*  CALCIUM 8.1* 8.2*    Liver Function Tests: Recent Labs  Lab 06/12/22 0519 06/13/22 0443  AST 13* 12*  ALT 12 12  ALKPHOS 79 64  BILITOT 0.4 0.3  PROT 6.2* 5.2*  ALBUMIN 1.8* 1.6*    CBG: Recent Labs  Lab 06/12/22 0500 06/12/22 1156 06/12/22 1643 06/12/22 2103 06/13/22 0743  GLUCAP 376* 347* 224* 123* 102*    Urinalysis    Component Value Date/Time   COLORURINE YELLOW 06/12/2022 0519   APPEARANCEUR CLEAR 06/12/2022 0519   LABSPEC 1.017 06/12/2022 0519   PHURINE 5.0 06/12/2022 0519   GLUCOSEU >=500 (A) 06/12/2022 0519   GLUCOSEU NEGATIVE 10/16/2019 0912   HGBUR NEGATIVE 06/12/2022 0519   BILIRUBINUR NEGATIVE 06/12/2022 0519   KETONESUR NEGATIVE 06/12/2022 0519   PROTEINUR 100 (A) 06/12/2022 0519   UROBILINOGEN 0.2 10/16/2019 0912   NITRITE NEGATIVE 06/12/2022 0519   LEUKOCYTESUR NEGATIVE 06/12/2022 0519    Discussed in detail with patient's spouse and son at bedside, updated care and answered all questions.  Comforted them.  Time coordinating discharge: 25 minutes  SIGNED:  Vernell Leep, MD,  FACP, [place], [facility], [facility], [place]     To contact the  attending provider between 7A-7P or the covering provider during after hours 7P-7A, please log into the web site [email or web address] and access using universal [place] password for that web  site. If you do not have the password, please call the hospital operator.

## 2022-06-17 LAB — CULTURE, BLOOD (ROUTINE X 2)
Special Requests: ADEQUATE
Special Requests: ADEQUATE

## 2022-07-02 DEATH — deceased

## 2023-07-18 IMAGING — DX DG CHEST 1V PORT
1 series · 1 of 1 positions shown · non-contrast
Comparison: Chest x-ray 09/22/2012.

CLINICAL DATA: Concern for pneumonia.

EXAM:
PORTABLE CHEST 1 VIEW

[chest ap]
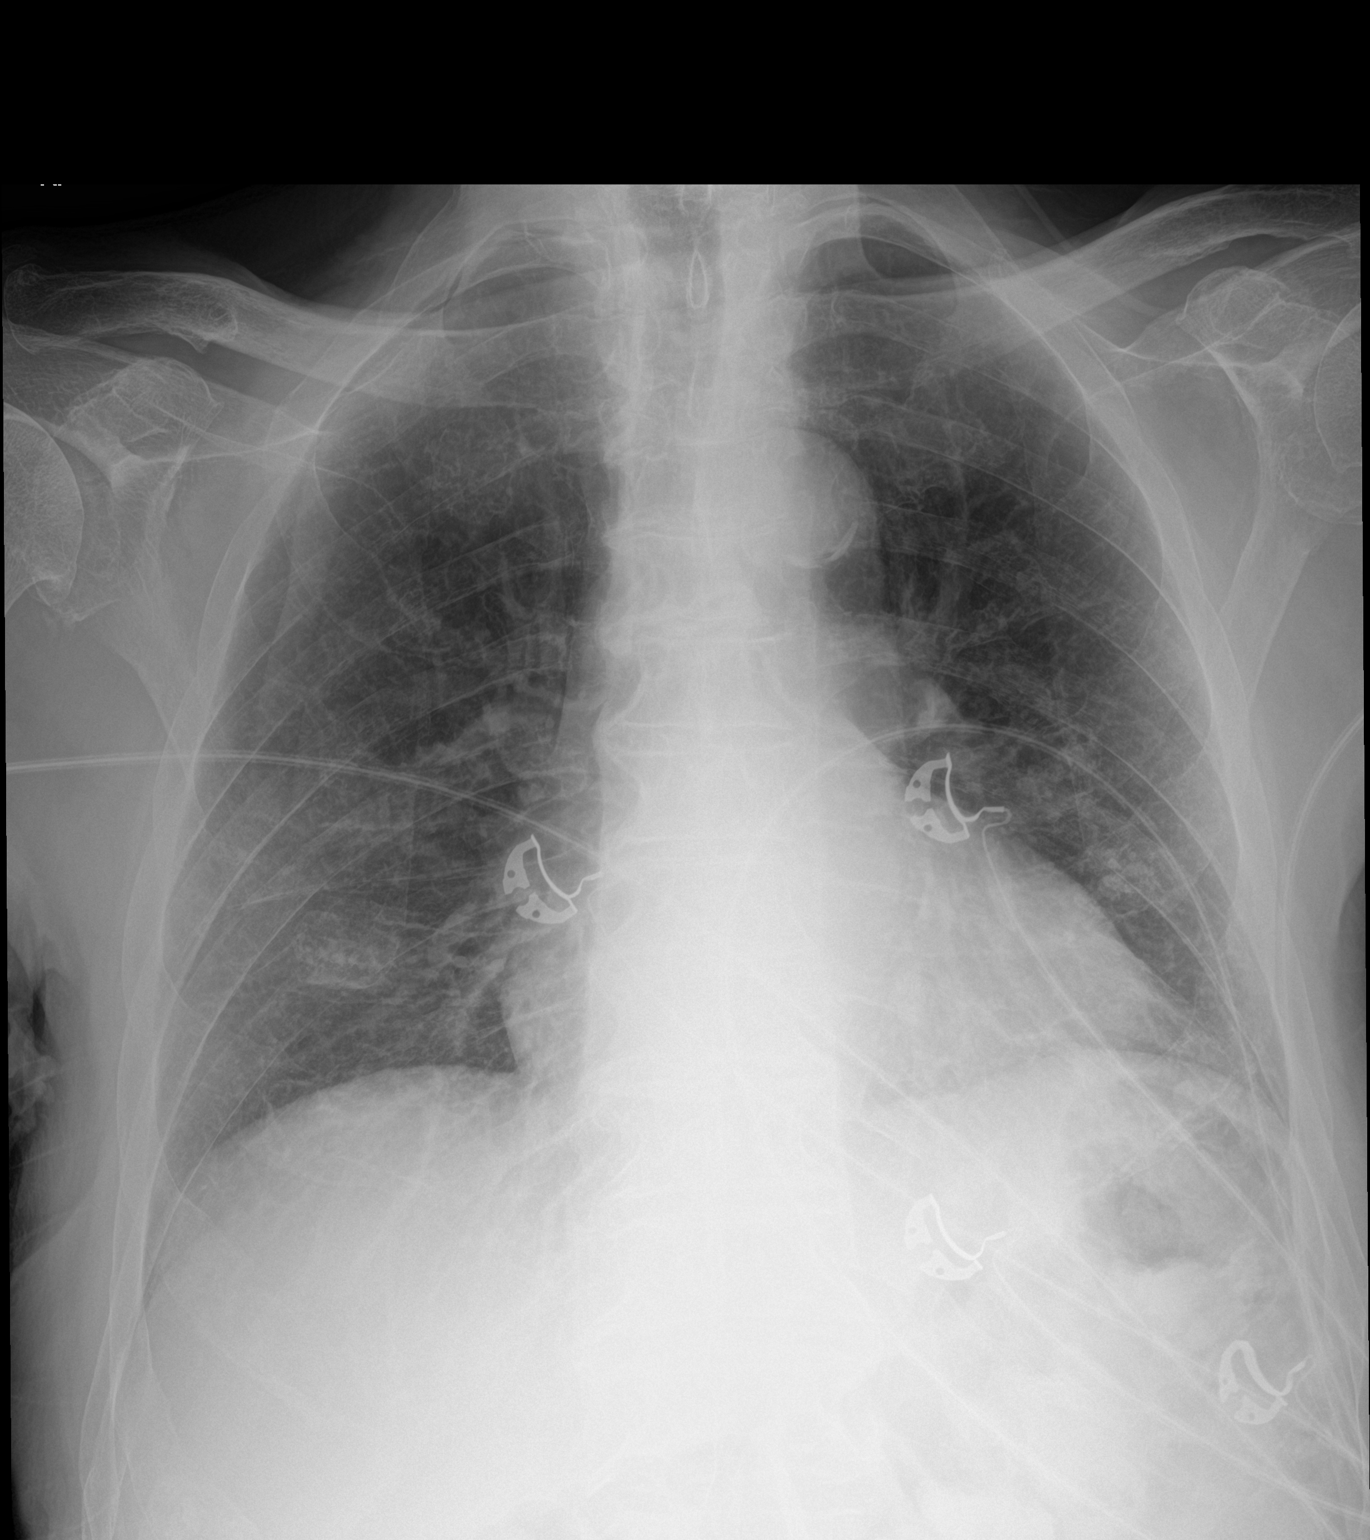

[1 of 1 positions shown; findings below may reference images not displayed]

FINDINGS: The heart size and mediastinal contours are within normal limits.
There is no lung consolidation, pleural effusion or pneumothorax.
There are some small calcific densities in the lower lungs
bilaterally which may represent prominent rib ends. Small calcified
nodules not excluded. Skeletal structures are unremarkable.
IMPRESSION: 1. No evidence for focal pneumonia.
2. Small calcified densities in the lower lungs bilaterally may
represent prominent rib ends were small calcified nodules. This can
be further evaluated with chest CT if clinically warranted.
